# Patient Record
Sex: Male | Born: 1965 | Race: Black or African American | Hispanic: No | Marital: Married | Smoking: Current some day smoker
Health system: Southern US, Community
[De-identification: ages and names within clinical notes are randomized; demographics above are authoritative.]

## PROBLEM LIST (undated history)

## (undated) DIAGNOSIS — I2510 Atherosclerotic heart disease of native coronary artery without angina pectoris: Secondary | ICD-10-CM

## (undated) DIAGNOSIS — F411 Generalized anxiety disorder: Secondary | ICD-10-CM

## (undated) DIAGNOSIS — K219 Gastro-esophageal reflux disease without esophagitis: Secondary | ICD-10-CM

## (undated) DIAGNOSIS — Z720 Tobacco use: Secondary | ICD-10-CM

## (undated) DIAGNOSIS — I255 Ischemic cardiomyopathy: Secondary | ICD-10-CM

## (undated) DIAGNOSIS — E785 Hyperlipidemia, unspecified: Secondary | ICD-10-CM

## (undated) DIAGNOSIS — I110 Hypertensive heart disease with heart failure: Secondary | ICD-10-CM

## (undated) DIAGNOSIS — I10 Essential (primary) hypertension: Secondary | ICD-10-CM

## (undated) DIAGNOSIS — A64 Unspecified sexually transmitted disease: Secondary | ICD-10-CM

## (undated) DIAGNOSIS — Z9119 Patient's noncompliance with other medical treatment and regimen: Secondary | ICD-10-CM

## (undated) DIAGNOSIS — M5430 Sciatica, unspecified side: Secondary | ICD-10-CM

## (undated) DIAGNOSIS — K611 Rectal abscess: Secondary | ICD-10-CM

## (undated) DIAGNOSIS — K921 Melena: Secondary | ICD-10-CM

## (undated) DIAGNOSIS — F1210 Cannabis abuse, uncomplicated: Secondary | ICD-10-CM

## (undated) DIAGNOSIS — I219 Acute myocardial infarction, unspecified: Secondary | ICD-10-CM

## (undated) DIAGNOSIS — Z973 Presence of spectacles and contact lenses: Secondary | ICD-10-CM

## (undated) DIAGNOSIS — Z91199 Patient's noncompliance with other medical treatment and regimen due to unspecified reason: Secondary | ICD-10-CM

## (undated) DIAGNOSIS — I5042 Chronic combined systolic (congestive) and diastolic (congestive) heart failure: Secondary | ICD-10-CM

## (undated) HISTORY — DX: Presence of spectacles and contact lenses: Z97.3

## (undated) HISTORY — DX: Sciatica, unspecified side: M54.30

## (undated) HISTORY — DX: Acute myocardial infarction, unspecified: I21.9

## (undated) HISTORY — DX: Generalized anxiety disorder: F41.1

## (undated) HISTORY — DX: Rectal abscess: K61.1

## (undated) HISTORY — PX: FOOT SURGERY: SHX648

## (undated) HISTORY — PX: INSERT / REPLACE / REMOVE PACEMAKER: SUR710

## (undated) HISTORY — DX: Gastro-esophageal reflux disease without esophagitis: K21.9

---

## 2009-04-12 HISTORY — PX: BYPASS GRAFT: SHX909

## 2010-03-13 DIAGNOSIS — E785 Hyperlipidemia, unspecified: Secondary | ICD-10-CM | POA: Insufficient documentation

## 2010-03-20 DIAGNOSIS — I2510 Atherosclerotic heart disease of native coronary artery without angina pectoris: Secondary | ICD-10-CM | POA: Insufficient documentation

## 2011-03-13 DIAGNOSIS — I219 Acute myocardial infarction, unspecified: Secondary | ICD-10-CM

## 2011-03-13 HISTORY — DX: Acute myocardial infarction, unspecified: I21.9

## 2012-01-09 ENCOUNTER — Other Ambulatory Visit (HOSPITAL_COMMUNITY): Payer: Self-pay

## 2012-01-09 ENCOUNTER — Other Ambulatory Visit: Payer: Self-pay

## 2012-01-09 ENCOUNTER — Emergency Department (HOSPITAL_COMMUNITY): Payer: Self-pay

## 2012-01-09 ENCOUNTER — Emergency Department (HOSPITAL_COMMUNITY)
Admission: EM | Admit: 2012-01-09 | Discharge: 2012-01-09 | Disposition: A | Discharge status: Home / Self Care | Payer: Self-pay | Attending: Emergency Medicine | Admitting: Emergency Medicine

## 2012-01-09 ENCOUNTER — Encounter (HOSPITAL_COMMUNITY): Payer: Self-pay | Admitting: Emergency Medicine

## 2012-01-09 DIAGNOSIS — Z91148 Patient's other noncompliance with medication regimen for other reason: Secondary | ICD-10-CM

## 2012-01-09 DIAGNOSIS — I2510 Atherosclerotic heart disease of native coronary artery without angina pectoris: Secondary | ICD-10-CM | POA: Insufficient documentation

## 2012-01-09 DIAGNOSIS — Z91199 Patient's noncompliance with other medical treatment and regimen due to unspecified reason: Secondary | ICD-10-CM | POA: Insufficient documentation

## 2012-01-09 DIAGNOSIS — Z951 Presence of aortocoronary bypass graft: Secondary | ICD-10-CM | POA: Insufficient documentation

## 2012-01-09 DIAGNOSIS — R918 Other nonspecific abnormal finding of lung field: Secondary | ICD-10-CM

## 2012-01-09 DIAGNOSIS — Z9119 Patient's noncompliance with other medical treatment and regimen: Secondary | ICD-10-CM | POA: Insufficient documentation

## 2012-01-09 DIAGNOSIS — J90 Pleural effusion, not elsewhere classified: Secondary | ICD-10-CM

## 2012-01-09 DIAGNOSIS — R5381 Other malaise: Secondary | ICD-10-CM | POA: Insufficient documentation

## 2012-01-09 DIAGNOSIS — R531 Weakness: Secondary | ICD-10-CM

## 2012-01-09 DIAGNOSIS — Z9114 Patient's other noncompliance with medication regimen: Secondary | ICD-10-CM

## 2012-01-09 HISTORY — DX: Atherosclerotic heart disease of native coronary artery without angina pectoris: I25.10

## 2012-01-09 LAB — CBC WITH DIFFERENTIAL/PLATELET
Basophils Absolute: 0.1 10*3/uL (ref 0.0–0.1)
Basophils Relative: 1 % (ref 0–1)
Eosinophils Absolute: 0.2 10*3/uL (ref 0.0–0.7)
Eosinophils Relative: 4 % (ref 0–5)
MCH: 30.3 pg (ref 26.0–34.0)
MCHC: 34 g/dL (ref 30.0–36.0)
MCV: 89.3 fL (ref 78.0–100.0)
Monocytes Absolute: 0.5 10*3/uL (ref 0.1–1.0)
Platelets: 332 10*3/uL (ref 150–400)
RDW: 14 % (ref 11.5–15.5)
WBC: 6.7 10*3/uL (ref 4.0–10.5)

## 2012-01-09 LAB — COMPREHENSIVE METABOLIC PANEL
ALT: 33 U/L (ref 0–53)
AST: 65 U/L — ABNORMAL HIGH (ref 0–37)
Calcium: 9.2 mg/dL (ref 8.4–10.5)
Creatinine, Ser: 1.06 mg/dL (ref 0.50–1.35)
Sodium: 140 mEq/L (ref 135–145)
Total Protein: 6.7 g/dL (ref 6.0–8.3)

## 2012-01-09 LAB — PRO B NATRIURETIC PEPTIDE: Pro B Natriuretic peptide (BNP): 4024 pg/mL — ABNORMAL HIGH (ref 0–125)

## 2012-01-09 LAB — POCT I-STAT TROPONIN I

## 2012-01-09 MED ORDER — SODIUM CHLORIDE 0.9 % IV BOLUS (SEPSIS)
1000.0000 mL | Freq: Once | INTRAVENOUS | Status: AC
  Administered 2012-01-09: 1000 mL via INTRAVENOUS

## 2012-01-09 MED ORDER — IOHEXOL 300 MG/ML  SOLN
80.0000 mL | Freq: Once | INTRAMUSCULAR | Status: AC | PRN
  Administered 2012-01-09: 80 mL via INTRAVENOUS

## 2012-01-09 MED ORDER — FUROSEMIDE 10 MG/ML IJ SOLN
20.0000 mg | Freq: Once | INTRAMUSCULAR | Status: AC
  Administered 2012-01-09: 20 mg via INTRAVENOUS
  Filled 2012-01-09: qty 2

## 2012-01-09 NOTE — ED Notes (Signed)
Pt reports that when he goes to sleep he wakes up and cannot breathe; he stats "his breathing sessions are not long". Pt states he feels energy is drained; Pt smokes 2-3 cigs/ day. Pt states he has a cough but does not feel SOB or have chest pain. Says he cannot walk up a flight of stairs because he has no energy but not because he is SOB.

## 2012-01-09 NOTE — ED Notes (Signed)
Patient transported to CT 

## 2012-01-09 NOTE — ED Notes (Signed)
Pt takes aspirin, klor-con, lisinopril, simvastatin, carvedilol, furosemide

## 2012-01-09 NOTE — ED Notes (Signed)
Report received, assumed care.  

## 2012-01-09 NOTE — ED Notes (Signed)
Pt states that he has been feeling weak since wed. Pt states that he has been having generalized weakness, that he was unable to walk up the stairs and that he hasn't really gotten much energy back since Wed. Pt states that he has been eating and sleeping well but admits that he has been under a lot of stress lately. Pt alert and oriented, able to follow commands and move extremities.

## 2012-01-09 NOTE — ED Notes (Signed)
ED EKG done in triage; given to EDP/PA.

## 2012-01-09 NOTE — ED Provider Notes (Signed)
History     CSN: [id number]  Arrival date & time 01/09/12  0441   First MD Initiated Contact with Patient 01/09/12 [phone]      Chief Complaint  Patient presents with  . Weakness    (Consider location/radiation/quality/duration/timing/severity/associated sxs/prior treatment) HPI  46 year old male with history of coronary artery disease and prior history of coronary bypass graft presents complaining of weakness. Patient reports he just recently moved to [facility] about 2 weeks ago. He has been trying to work out, and walk at least 2 miles daily in preparation for his new job. He also lost 4 lbs in the past month from diet and exercise. Patient states he stopped taking his medication about 4 days ago because he felt he doesn't need it anymore. Medication include blood pressure, hyperlipidemia, and fluid pills. For the past 3 days he has been feeling weaker than usual. He described as a generalized weakness. He also states he's having trouble sleeping at night, waking up in a panic state. He is unable to attribute his symptoms to anything specific. He denies fever, chills, productive cough, chest pain, shortness of breath, dyspnea on exertion, hemoptysis, nausea, vomiting, diarrhea, abdominal pain, back pain, numbness or tingling sensation. He does not have a primary care Dr. Here in [place].  He still has medications available at home.  Past Medical History  Diagnosis Date  . Coronary artery disease     Past Surgical History  Procedure Date  . Bypass graft 2011    No family history on file.  History  Substance Use Topics  . Smoking status: Not on file  . Smokeless tobacco: Not on file  . Alcohol Use:       Review of Systems  All other systems reviewed and are negative.    Allergies  Review of patient's allergies indicates no known allergies.  Home Medications  No current outpatient prescriptions on file.  BP 171/124  Temp 98.6 F (37 C) (Oral)  Resp 18  SpO2  98%  Physical Exam  Nursing note and vitals reviewed. Constitutional: He appears well-developed and well-nourished. No distress.       Awake, alert, nontoxic appearance  HENT:  Head: Atraumatic.  Mouth/Throat: Oropharynx is clear and moist.  Eyes: Conjunctivae normal are normal. Right eye exhibits no discharge. Left eye exhibits no discharge.  Neck: Normal range of motion. Neck supple.  Cardiovascular: Normal rate and regular rhythm.  Exam reveals no gallop and no friction rub.   Murmur heard.      2 of 6 systolic murmur heard best heard at left  2nd-3rd intercostal space, nonradiating.  Pulmonary/Chest: Effort normal. No respiratory distress. He has rales. He exhibits no tenderness.       Rales heard at the base of lungs. No wheezes or rhonchi  Abdominal: Soft. There is no tenderness. There is no rebound.  Musculoskeletal: He exhibits no edema and no tenderness.       ROM appears intact, no obvious focal weakness  Neurological: He is alert. He has normal strength and normal reflexes. He displays no atrophy and no tremor. He exhibits normal muscle tone. He displays a negative Romberg sign. He displays no seizure activity. Coordination and gait normal. GCS eye subscore is 4. GCS verbal subscore is 5. GCS motor subscore is 6.  Reflex Scores:      Patellar reflexes are 2+ on the right side and 2+ on the left side.      5 out 5 strength to both upper and lower  extremities.  Skin: Skin is warm and dry. No rash noted.  Psychiatric: He has a normal mood and affect.    ED Course  Procedures (including critical care time)  Labs Reviewed  COMPREHENSIVE METABOLIC PANEL - Abnormal; Notable for the following:    Albumin 3.4 (*)     AST 65 (*)     Alkaline Phosphatase 161 (*)     GFR calc non Af Amer 84 (*)     All other components within normal limits  CBC WITH DIFFERENTIAL  POCT I-STAT TROPONIN I   No results found.   No diagnosis found.   Date: 01/09/2012  Rate: 92  Rhythm: normal  sinus rhythm  QRS Axis: right  Intervals: QT prolonged  ST/T Wave abnormalities: nonspecific ST/T changes  Conduction Disutrbances:none  Narrative Interpretation: pulmonary disease pattern.  No S1Q3T3.  No stemi  Old EKG Reviewed: none available  Results for orders placed during the hospital encounter of 01/09/12  CBC WITH DIFFERENTIAL      Component Value Range   WBC 6.7  4.0 - 10.5 K/uL   RBC 4.78  4.22 - 5.81 MIL/uL   Hemoglobin 14.5  13.0 - 17.0 g/dL   HCT 40.9  81.1 - 91.4 %   MCV 89.3  78.0 - 100.0 fL   MCH 30.3  26.0 - 34.0 pg   MCHC 34.0  30.0 - 36.0 g/dL   RDW 78.2  95.6 - 21.3 %   Platelets 332  150 - 400 K/uL   Neutrophils Relative 62  43 - 77 %   Neutro Abs 4.2  1.7 - 7.7 K/uL   Lymphocytes Relative 26  12 - 46 %   Lymphs Abs 1.8  0.7 - 4.0 K/uL   Monocytes Relative 8  3 - 12 %   Monocytes Absolute 0.5  0.1 - 1.0 K/uL   Eosinophils Relative 4  0 - 5 %   Eosinophils Absolute 0.2  0.0 - 0.7 K/uL   Basophils Relative 1  0 - 1 %   Basophils Absolute 0.1  0.0 - 0.1 K/uL  COMPREHENSIVE METABOLIC PANEL      Component Value Range   Sodium 140  135 - 145 mEq/L   Potassium 3.5  3.5 - 5.1 mEq/L   Chloride 107  96 - 112 mEq/L   CO2 23  19 - 32 mEq/L   Glucose, Bld 94  70 - 99 mg/dL   BUN 13  6 - 23 mg/dL   Creatinine, Ser 0.86  0.50 - 1.35 mg/dL   Calcium 9.2  8.4 - 57.8 mg/dL   Total Protein 6.7  6.0 - 8.3 g/dL   Albumin 3.4 (*) 3.5 - 5.2 g/dL   AST 65 (*) 0 - 37 U/L   ALT 33  0 - 53 U/L   Alkaline Phosphatase 161 (*) 39 - 117 U/L   Total Bilirubin 0.6  0.3 - 1.2 mg/dL   GFR calc non Af Amer 84 (*) >90 mL/min   GFR calc Af Amer >90  >90 mL/min  POCT I-STAT TROPONIN I      Component Value Range   Troponin i, poc 0.03  0.00 - 0.08 ng/mL   Comment 3           PRO B NATRIURETIC PEPTIDE      Component Value Range   Pro B Natriuretic peptide (BNP) 4024.0 (*) 0 - 125 pg/mL   Dg Chest 2 View  01/09/2012  *RADIOLOGY REPORT*  Clinical Data: Weakness  and fatigue.  CHEST  - 2 VIEW  Comparison: None.  Findings: There is a round nodular density in the right hilar region, measuring 3.5 x 3.8 cm.  Increased densities in the right infrahilar region.  Findings concerning for lymphadenopathy or mass lesion in this region.  The patient has cardiomegaly and previous median sternotomy.  There is increased densities overlying the left cardiac shadow of unknown etiology.  There may be small pleural effusions.  IMPRESSION: Concern for a mass or lymphadenopathy in the right hilum.  There are associated parenchymal densities in the right infrahilar region.  In addition, there is a vague density overlying the left side of the cardiac shadow.  Findings raise concern for a neoplastic process.  Recommend further evaluation with a chest CT with IV contrast.  These results were called by telephone on 01/09/2012 at 7:05 a.m. to Dr. Rhunette Croft, who verbally acknowledged these results.   Original Report Authenticated By: Richarda Overlie, M.D.    Ct Chest W Contrast  01/09/2012  *RADIOLOGY REPORT*  Clinical Data: Abnormal chest radiograph, cough  CT CHEST WITH CONTRAST  Technique:  Multidetector CT imaging of the chest was performed following the standard protocol during bolus administration of intravenous contrast.  Contrast: 80mL OMNIPAQUE IOHEXOL 300 MG/ML  SOLN  Comparison: Chest radiograph stated 01/09/2012  Findings: The suspected right perihilar mass corresponds to overlapping pulmonary vessels on CT.  No corresponding mass or adenopathy is seen.  3 mm noncalcified subpleural nodule in the left upper lobe (series 3/image 23).  2 mm calcified granulomas in the left upper lobe and left lower lobe (series 3/images 26 and 27).  3 mm calcified granulomas in the right upper and lower lobes (series 3/image 19 and 36).  Moderate right pleural effusion.  Trace fluid along the left major fissure.  Mild linear scarring at the left lung base.  No pneumothorax.  Visualized thyroid is unremarkable.  Cardiomegaly.  No  pericardial effusion.  Coronary atherosclerosis (series 2/image 29). Postsurgical changes related to prior CABG. Mild atherosclerotic calcifications of the aortic arch.  No suspicious mediastinal, hilar, or axillary lymphadenopathy.  Visualized upper abdomen is notable for a probable left renal cyst (series 2/image 65).  Mild degenerative changes of the visualized thoracolumbar spine.  IMPRESSION: Suspected radiographic abnormality corresponds to overlapping pulmonary vessels on CT.  No mass or adenopathy is seen.  Cardiomegaly with moderate right pleural effusion.  3 mm noncalcified subpleural nodule in the left upper lobe.  If this patient is high risk for primary bronchogenic malignancy, a single follow-up CT chest is suggested in 12 months.  Otherwise, no dedicated follow-up imaging is required per Fleischner Society guidelines.  This recommendation follows the consensus statement: Guidelines for Management of Small Pulmonary Nodules Detected on CT Scans:  A Statement from the Fleischner Society as published in Radiology 2005; 237:395-400.   Original Report Authenticated By: Charline Bills, M.D.     1. Generalized weakness 2. Medication noncompliant 3. Abnormal finding on CT scan 4. R side pleural effusion   MDM  Vague generalized weakness complaint.  No focal neuro deficits.  No weakness appreciated to upper and lower extremities on exam.  Since pt has hx of cardiac disease, will obtain ECG, labs, BNP, and CXR. Pt's  story raises low suspicion for PE.  No prior hx of CHF.  Pt walks 2 miles daily, denies DOE.  Will not perform Chest CTA today.  Discussed with my attending  7:36 AM CXR shows a mass in R hilum suspicious for  neoplasm.  Pt has no hx of cancer. Denies fever, nightsweat, myalgias, unexplained weight loss, or FH cancer.  Is a smoker and smoke 6-7 cigarettes daily.  No hemoptysis. Will obtain Chest CT w/CM for further evaluation.  IVF given.  WIll continue to monitor.     9:13  AM Chest CT shows moderate R pleural effusion without obvious mass or adenopathy.  Pt is maintaining good O2 and does not complain of SOB.  I discussed with my attending, who recommend against pleural aspiration at this time.  Will give IV lasix.  Pt without complaints worrisome for infection.    There is a 3mm noncalcified subpleural nodule in the L upper lobe.  A single followup CT chest is suggested in 12 month. I discussed with pt in regard to CT f/u in 12 months.  I also give pt resources and close follow up instruction.    Pt admits that he has not been taking his medication as recommended for the past several months because he does not like the side effect of impotency and also he has been working out and dieting and think he does not need his meds. I recommend resuming his meds.  Pt agrees to f/u with PCP for recheck. Pt does take BB which can increase impotency.    Pt able to ambulate with normal O2, no respiratory complaints.    time spent personally by me on the following activities: Review prior charts, imaging, and labs.  Development of treatment plan with patient and/or surrogate as well as nursing, discussions with consultants, evaluation of patient's response to treatment, examination of patient, obtaining history from patient or surrogate, ordering and performing treatments and interventions, ordering and review of laboratory studies, ordering and review of radiographic studies, pulse oximetry and re-evaluation of patient's condition.  BP 174/119  Pulse 91  Temp 97.2 F (36.2 C) (Oral)  Resp 17  SpO2 98%      Fayrene Helper, PA-C 01/09/12 0946  Fayrene Helper, PA-C 01/09/12 [phone]

## 2012-01-12 NOTE — ED Provider Notes (Signed)
Medical screening examination/treatment/procedure(s) were performed by non-physician practitioner and as supervising physician I was immediately available for consultation/collaboration.  Derwood Kaplan, MD 01/12/12 [phone]

## 2013-02-14 ENCOUNTER — Encounter (HOSPITAL_COMMUNITY): Payer: Self-pay | Admitting: Emergency Medicine

## 2013-02-14 ENCOUNTER — Emergency Department (HOSPITAL_COMMUNITY)
Admission: EM | Admit: 2013-02-14 | Discharge: 2013-02-14 | Disposition: A | Discharge status: Home / Self Care | Payer: BC Managed Care – PPO | Attending: Emergency Medicine | Admitting: Emergency Medicine

## 2013-02-14 DIAGNOSIS — I2510 Atherosclerotic heart disease of native coronary artery without angina pectoris: Secondary | ICD-10-CM | POA: Insufficient documentation

## 2013-02-14 DIAGNOSIS — Z79899 Other long term (current) drug therapy: Secondary | ICD-10-CM | POA: Insufficient documentation

## 2013-02-14 DIAGNOSIS — R61 Generalized hyperhidrosis: Secondary | ICD-10-CM | POA: Insufficient documentation

## 2013-02-14 DIAGNOSIS — Z951 Presence of aortocoronary bypass graft: Secondary | ICD-10-CM | POA: Insufficient documentation

## 2013-02-14 DIAGNOSIS — Z7982 Long term (current) use of aspirin: Secondary | ICD-10-CM | POA: Insufficient documentation

## 2013-02-14 DIAGNOSIS — K089 Disorder of teeth and supporting structures, unspecified: Secondary | ICD-10-CM | POA: Insufficient documentation

## 2013-02-14 DIAGNOSIS — I10 Essential (primary) hypertension: Secondary | ICD-10-CM | POA: Insufficient documentation

## 2013-02-14 HISTORY — DX: Essential (primary) hypertension: I10

## 2013-02-14 LAB — CBC WITH DIFFERENTIAL/PLATELET
Basophils Absolute: 0 10*3/uL (ref 0.0–0.1)
Basophils Relative: 0 % (ref 0–1)
HCT: 49.8 % (ref 39.0–52.0)
Hemoglobin: 17.4 g/dL — ABNORMAL HIGH (ref 13.0–17.0)
Lymphocytes Relative: 17 % (ref 12–46)
MCHC: 34.9 g/dL (ref 30.0–36.0)
Monocytes Absolute: 1 10*3/uL (ref 0.1–1.0)
Monocytes Relative: 9 % (ref 3–12)
Neutro Abs: 7.3 10*3/uL (ref 1.7–7.7)
Neutrophils Relative %: 72 % (ref 43–77)
RDW: 13.7 % (ref 11.5–15.5)
WBC: 10.2 10*3/uL (ref 4.0–10.5)

## 2013-02-14 LAB — BASIC METABOLIC PANEL
CO2: 26 mEq/L (ref 19–32)
Chloride: 104 mEq/L (ref 96–112)
Creatinine, Ser: 1.09 mg/dL (ref 0.50–1.35)
GFR calc Af Amer: >90 mL/min (ref >90)
Potassium: 3.9 mEq/L (ref 3.5–5.1)

## 2013-02-14 LAB — TROPONIN I: Troponin I: <0.3 ng/mL (ref <0.30)

## 2013-02-14 MED ORDER — ONDANSETRON HCL 4 MG/2ML IJ SOLN
4.0000 mg | Freq: Once | INTRAMUSCULAR | Status: AC
  Administered 2013-02-14: 4 mg via INTRAVENOUS
  Filled 2013-02-14: qty 2

## 2013-02-14 MED ORDER — ONDANSETRON 4 MG PO TBDP: 4.0000 mg | ORAL_TABLET | Freq: Three times a day (TID) | ORAL | Status: DC | PRN

## 2013-02-14 MED ORDER — CARVEDILOL 6.25 MG PO TABS
6.2500 mg | ORAL_TABLET | Freq: Two times a day (BID) | ORAL | Status: DC
  Administered 2013-02-14 (×2): 6.25 mg via ORAL
  Filled 2013-02-14 (×2): qty 1

## 2013-02-14 MED ORDER — LABETALOL HCL 5 MG/ML IV SOLN
20.0000 mg | INTRAVENOUS | Status: DC | PRN
  Administered 2013-02-14 (×2): 20 mg via INTRAVENOUS
  Filled 2013-02-14 (×2): qty 4

## 2013-02-14 MED ORDER — ENALAPRILAT 1.25 MG/ML IV SOLN
1.2500 mg | Freq: Once | INTRAVENOUS | Status: AC
  Administered 2013-02-14: 1.25 mg via INTRAVENOUS
  Filled 2013-02-14: qty 1

## 2013-02-14 MED ORDER — FENTANYL CITRATE 0.05 MG/ML IJ SOLN
50.0000 ug | INTRAMUSCULAR | Status: DC | PRN
  Administered 2013-02-14 (×2): 50 ug via INTRAVENOUS
  Filled 2013-02-14 (×2): qty 2

## 2013-02-14 NOTE — ED Notes (Signed)
Pt given gingerale and crackers 

## 2013-02-14 NOTE — ED Notes (Signed)
Per EMS - pt just had tooth extracted around 1pm today, then went to [employer] then ate some chili at [employer]. After he started to have really bad tooth pain and a HA. Pt has hx of MI and HTN. Pt last took BP meds on Sunday (3 days ago). Upon ems arrival pt was SOB and diaphoretic. BP 275/140. ST on monitor. 12 lead unremarkable. No neruo deficits noted. NKA.

## 2013-02-14 NOTE — ED Provider Notes (Signed)
CSN: [id number]     Arrival date & time 02/14/13  1530 History   First MD Initiated Contact with Patient 02/14/13 1532     Chief Complaint  Patient presents with  . Hypertension  . Dental Pain    HPI  47 year old male history of coronary bypass grafting. Had a dental extraction today due to a chronically painful tooth. Started having severe pain within an hour of extraction. Called paramedics because of some severe pain. Noted to be markedly hypertensive.  No chest pain. He has been around his extraction site is that of a global headache. He is diaphoretic on arrival complaining of 10 over 10 pain.  Prehospital course: Received a call from paramedics describing the above patient requesting blood pressure medications. Deferred this and asked to give morphine. He was given total of 10mg  of IV morphine en route. Some minimal improvement of his pain.   Past Medical History  Diagnosis Date  . Coronary artery disease   . Hypertension    Past Surgical History  Procedure Laterality Date  . Bypass graft  2011   History reviewed. No pertinent family history. History  Substance Use Topics  . Smoking status: Never Smoker   . Smokeless tobacco: Not on file  . Alcohol Use: Yes     Comment: ocassionaly    Review of Systems  Constitutional: Negative for fever, chills, diaphoresis, appetite change and fatigue.  HENT: Positive for dental problem. Negative for mouth sores, sore throat and trouble swallowing.        Extraction pain  Eyes: Negative for visual disturbance.  Respiratory: Negative for cough, chest tightness, shortness of breath and wheezing.   Cardiovascular: Negative for chest pain.  Gastrointestinal: Negative for nausea, vomiting, abdominal pain, diarrhea and abdominal distention.  Endocrine: Negative for polydipsia, polyphagia and polyuria.  Genitourinary: Negative for dysuria, frequency and hematuria.  Musculoskeletal: Negative for gait problem.  Skin: Negative for color  change, pallor and rash.  Neurological: Negative for dizziness, syncope, light-headedness and headaches.  Hematological: Does not bruise/bleed easily.  Psychiatric/Behavioral: Negative for behavioral problems and confusion.    Allergies  Review of patient's allergies indicates no known allergies.  Home Medications   Current Outpatient Rx  Name  Route  Sig  Dispense  Refill  . aspirin 325 MG tablet   Oral   Take 325 mg by mouth daily.         . carvedilol (COREG) 6.25 MG tablet   Oral   Take 6.25 mg by mouth 2 (two) times daily with a meal.         . furosemide (LASIX) 40 MG tablet   Oral   Take 40 mg by mouth daily.         . hydrochlorothiazide (HYDRODIURIL) 25 MG tablet   Oral   Take 25 mg by mouth daily.         Marland Kitchen HYDROcodone-acetaminophen (NORCO/VICODIN) 5-325 MG per tablet   Oral   Take 1 tablet by mouth every 6 (six) hours as needed for moderate pain.         Marland Kitchen lisinopril (PRINIVIL,ZESTRIL) 40 MG tablet   Oral   Take 40 mg by mouth daily.         Marland Kitchen OVER THE COUNTER MEDICATION   Oral   Take 1 application by mouth daily as needed ("Ambesol" for tooth/gum pain).         . potassium chloride SA (K-DUR,KLOR-CON) 20 MEQ tablet   Oral   Take 20 mEq by  mouth daily.         . simvastatin (ZOCOR) 20 MG tablet   Oral   Take 20 mg by mouth every evening.         . ondansetron (ZOFRAN ODT) 4 MG disintegrating tablet   Oral   Take 1 tablet (4 mg total) by mouth every 8 (eight) hours as needed for nausea.   6 tablet   0    BP 196/114  Pulse 90  Temp(Src) 99.3 F (37.4 C) (Oral)  Resp 20  SpO2 90% Physical Exam  Constitutional: He is oriented to person, place, and time. He appears well-developed and well-nourished. No distress.  Diaphoretic.  HENT:  Head: Normocephalic.  Mouth/Throat:    Eyes: Conjunctivae are normal. Pupils are equal, round, and reactive to light. No scleral icterus.  Neck: Normal range of motion. Neck supple. No  thyromegaly present.  Cardiovascular: Normal rate and regular rhythm.  Exam reveals no gallop and no friction rub.   No murmur heard. Pulmonary/Chest: Effort normal and breath sounds normal. No respiratory distress. He has no wheezes. He has no rales.  Abdominal: Soft. Bowel sounds are normal. He exhibits no distension. There is no tenderness. There is no rebound.  Musculoskeletal: Normal range of motion.  Neurological: He is alert and oriented to person, place, and time.  Skin: Skin is warm and dry. No rash noted.  Psychiatric: He has a normal mood and affect. His behavior is normal.    ED Course  Dental Date/Time: 02/14/2013 3:53 PM Performed by: Roney Marion Authorized by: Rolland Porter J Consent: Verbal consent obtained. Consent given by: patient Patient understanding: patient states understanding of the procedure being performed Local anesthesia used: yes Anesthesia: local infiltration Local anesthetic: lidocaine 2% without epinephrine Anesthetic total: 3 ml Patient sedated: no Patient tolerance: Patient tolerated the procedure well with no immediate complications. Comments: Periapical block. Right infra-orbital nerve block.   (including critical care time) Labs Review Labs Reviewed  CBC WITH DIFFERENTIAL - Abnormal; Notable for the following:    Hemoglobin 17.4 (*)    All other components within normal limits  BASIC METABOLIC PANEL - Abnormal; Notable for the following:    Glucose, Bld 143 (*)    GFR calc non Af Amer 80 (*)    All other components within normal limits  TROPONIN I   Imaging Review No results found.  EKG Interpretation     Ventricular Rate:  82 PR Interval:  181 QRS Duration: 113 QT Interval:  422 QTC Calculation: 493 R Axis:   -137 Text Interpretation:  Sinus Rhythm Incomplete right bundle branch block Consider left ventricular hypertrophy Abnormal T, consider ischemia, lateral leads No change vs 01-08-2013            MDM   1.  Hypertension   2. Dental implant pain, initial encounter    Patient had good relief of his pain with the dental block. Was given IV pain medications with fentanyl as well. Was given IV Vasotec. Was given IV labetalol. His pressures have improved. He is pain-free. No bleeding from the extraction site. Plan is home continue as planned followup care hydrocodone and penicillin. Zofran for nausea.   Roney Marion, MD 02/14/13 [phone]

## 2013-02-14 NOTE — ED Notes (Signed)
Dr. James at bedside  

## 2013-02-14 NOTE — ED Notes (Signed)
Pt vomited.   

## 2013-02-14 NOTE — ED Notes (Signed)
Pt sts he is starting to feel a little better.  

## 2013-02-14 NOTE — ED Notes (Signed)
Lab at bedside

## 2013-04-05 ENCOUNTER — Other Ambulatory Visit: Payer: Self-pay

## 2013-04-05 ENCOUNTER — Emergency Department (HOSPITAL_COMMUNITY): Payer: BC Managed Care – PPO

## 2013-04-05 ENCOUNTER — Inpatient Hospital Stay (HOSPITAL_COMMUNITY)
Admission: EM | Admit: 2013-04-05 | Discharge: 2013-04-07 | DRG: 292 | Discharge status: Against Medical Advice | Payer: BC Managed Care – PPO | Attending: Internal Medicine | Admitting: Internal Medicine

## 2013-04-05 ENCOUNTER — Encounter (HOSPITAL_COMMUNITY): Payer: Self-pay | Admitting: Emergency Medicine

## 2013-04-05 DIAGNOSIS — I4729 Other ventricular tachycardia: Secondary | ICD-10-CM | POA: Diagnosis present

## 2013-04-05 DIAGNOSIS — D72829 Elevated white blood cell count, unspecified: Secondary | ICD-10-CM | POA: Diagnosis present

## 2013-04-05 DIAGNOSIS — R0602 Shortness of breath: Secondary | ICD-10-CM

## 2013-04-05 DIAGNOSIS — E785 Hyperlipidemia, unspecified: Secondary | ICD-10-CM | POA: Diagnosis present

## 2013-04-05 DIAGNOSIS — R9431 Abnormal electrocardiogram [ECG] [EKG]: Secondary | ICD-10-CM

## 2013-04-05 DIAGNOSIS — Z951 Presence of aortocoronary bypass graft: Secondary | ICD-10-CM

## 2013-04-05 DIAGNOSIS — I4581 Long QT syndrome: Secondary | ICD-10-CM | POA: Diagnosis present

## 2013-04-05 DIAGNOSIS — Z23 Encounter for immunization: Secondary | ICD-10-CM

## 2013-04-05 DIAGNOSIS — J90 Pleural effusion, not elsewhere classified: Secondary | ICD-10-CM | POA: Diagnosis present

## 2013-04-05 DIAGNOSIS — Z91199 Patient's noncompliance with other medical treatment and regimen due to unspecified reason: Secondary | ICD-10-CM

## 2013-04-05 DIAGNOSIS — I472 Ventricular tachycardia, unspecified: Secondary | ICD-10-CM | POA: Diagnosis present

## 2013-04-05 DIAGNOSIS — Z9114 Patient's other noncompliance with medication regimen: Secondary | ICD-10-CM

## 2013-04-05 DIAGNOSIS — Z9119 Patient's noncompliance with other medical treatment and regimen: Secondary | ICD-10-CM

## 2013-04-05 DIAGNOSIS — Z91148 Patient's other noncompliance with medication regimen for other reason: Secondary | ICD-10-CM

## 2013-04-05 DIAGNOSIS — I517 Cardiomegaly: Secondary | ICD-10-CM | POA: Diagnosis present

## 2013-04-05 DIAGNOSIS — Z79899 Other long term (current) drug therapy: Secondary | ICD-10-CM

## 2013-04-05 DIAGNOSIS — I252 Old myocardial infarction: Secondary | ICD-10-CM

## 2013-04-05 DIAGNOSIS — I2510 Atherosclerotic heart disease of native coronary artery without angina pectoris: Secondary | ICD-10-CM | POA: Diagnosis present

## 2013-04-05 DIAGNOSIS — Z7982 Long term (current) use of aspirin: Secondary | ICD-10-CM

## 2013-04-05 DIAGNOSIS — F411 Generalized anxiety disorder: Secondary | ICD-10-CM | POA: Diagnosis present

## 2013-04-05 DIAGNOSIS — Z87891 Personal history of nicotine dependence: Secondary | ICD-10-CM

## 2013-04-05 DIAGNOSIS — R0789 Other chest pain: Secondary | ICD-10-CM | POA: Diagnosis present

## 2013-04-05 DIAGNOSIS — I110 Hypertensive heart disease with heart failure: Secondary | ICD-10-CM

## 2013-04-05 DIAGNOSIS — I509 Heart failure, unspecified: Principal | ICD-10-CM | POA: Diagnosis present

## 2013-04-05 DIAGNOSIS — I10 Essential (primary) hypertension: Secondary | ICD-10-CM | POA: Diagnosis present

## 2013-04-05 LAB — URINALYSIS W MICROSCOPIC + REFLEX CULTURE
Glucose, UA: NEGATIVE mg/dL
Nitrite: NEGATIVE
Specific Gravity, Urine: 1.017 (ref 1.005–1.030)
pH: 5 (ref 5.0–8.0)

## 2013-04-05 LAB — CBC WITH DIFFERENTIAL/PLATELET
Basophils Absolute: 0 10*3/uL (ref 0.0–0.1)
Basophils Relative: 0 % (ref 0–1)
Eosinophils Absolute: 0.2 10*3/uL (ref 0.0–0.7)
HCT: 43.6 % (ref 39.0–52.0)
MCHC: 34.2 g/dL (ref 30.0–36.0)
MCV: 91 fL (ref 78.0–100.0)
Monocytes Absolute: 0.9 10*3/uL (ref 0.1–1.0)
Neutro Abs: 9.1 10*3/uL — ABNORMAL HIGH (ref 1.7–7.7)
Neutrophils Relative %: 79 % — ABNORMAL HIGH (ref 43–77)
RDW: 14.1 % (ref 11.5–15.5)

## 2013-04-05 LAB — RAPID URINE DRUG SCREEN, HOSP PERFORMED
Cocaine: NOT DETECTED
Tetrahydrocannabinol: POSITIVE — AB

## 2013-04-05 LAB — COMPREHENSIVE METABOLIC PANEL
ALT: 25 U/L (ref 0–53)
AST: 39 U/L — ABNORMAL HIGH (ref 0–37)
Albumin: 3.5 g/dL (ref 3.5–5.2)
Calcium: 8.9 mg/dL (ref 8.4–10.5)
Chloride: 106 mEq/L (ref 96–112)
Creatinine, Ser: 1.25 mg/dL (ref 0.50–1.35)
Potassium: 3.8 mEq/L (ref 3.5–5.1)
Total Bilirubin: 0.5 mg/dL (ref 0.3–1.2)
Total Protein: 7.1 g/dL (ref 6.0–8.3)

## 2013-04-05 LAB — TROPONIN I: Troponin I: <0.3 ng/mL (ref <0.30)

## 2013-04-05 MED ORDER — FUROSEMIDE 10 MG/ML IJ SOLN
40.0000 mg | Freq: Once | INTRAMUSCULAR | Status: AC
  Administered 2013-04-06: 40 mg via INTRAVENOUS
  Filled 2013-04-05: qty 4

## 2013-04-05 MED ORDER — FUROSEMIDE 10 MG/ML IJ SOLN
40.0000 mg | Freq: Once | INTRAMUSCULAR | Status: AC
  Administered 2013-04-05: 40 mg via INTRAVENOUS
  Filled 2013-04-05: qty 4

## 2013-04-05 MED ORDER — CARVEDILOL 25 MG PO TABS
25.0000 mg | ORAL_TABLET | Freq: Once | ORAL | Status: AC
  Administered 2013-04-05: 25 mg via ORAL
  Filled 2013-04-05: qty 1

## 2013-04-05 MED ORDER — CARVEDILOL 25 MG PO TABS
25.0000 mg | ORAL_TABLET | Freq: Two times a day (BID) | ORAL | Status: DC
  Administered 2013-04-06 – 2013-04-07 (×3): 25 mg via ORAL
  Filled 2013-04-05 (×5): qty 1

## 2013-04-05 MED ORDER — LORAZEPAM 1 MG PO TABS
1.0000 mg | ORAL_TABLET | Freq: Once | ORAL | Status: AC
  Administered 2013-04-06: 1 mg via ORAL
  Filled 2013-04-05: qty 1

## 2013-04-05 MED ORDER — ALBUTEROL SULFATE (5 MG/ML) 0.5% IN NEBU
5.0000 mg | INHALATION_SOLUTION | Freq: Once | RESPIRATORY_TRACT | Status: AC
  Administered 2013-04-05: 5 mg via RESPIRATORY_TRACT
  Filled 2013-04-05: qty 1

## 2013-04-05 MED ORDER — ENOXAPARIN SODIUM 40 MG/0.4ML ~~LOC~~ SOLN
40.0000 mg | SUBCUTANEOUS | Status: DC
  Administered 2013-04-06 – 2013-04-07 (×2): 40 mg via SUBCUTANEOUS
  Filled 2013-04-05 (×2): qty 0.4

## 2013-04-05 MED ORDER — SODIUM CHLORIDE 0.9 % IJ SOLN
3.0000 mL | Freq: Two times a day (BID) | INTRAMUSCULAR | Status: DC
  Administered 2013-04-06 – 2013-04-07 (×3): 3 mL via INTRAVENOUS

## 2013-04-05 MED ORDER — LISINOPRIL 40 MG PO TABS
40.0000 mg | ORAL_TABLET | Freq: Every day | ORAL | Status: DC
  Administered 2013-04-06 – 2013-04-07 (×2): 40 mg via ORAL
  Filled 2013-04-05 (×2): qty 1

## 2013-04-05 MED ORDER — NICOTINE 14 MG/24HR TD PT24: 14.0000 mg | MEDICATED_PATCH | Freq: Every day | TRANSDERMAL | Status: DC

## 2013-04-05 MED ORDER — SODIUM CHLORIDE 0.9 % IJ SOLN: 3.0000 mL | INTRAMUSCULAR | Status: DC | PRN

## 2013-04-05 MED ORDER — LISINOPRIL 20 MG PO TABS
40.0000 mg | ORAL_TABLET | Freq: Once | ORAL | Status: AC
  Administered 2013-04-05: 40 mg via ORAL
  Filled 2013-04-05: qty 2

## 2013-04-05 MED ORDER — NICOTINE 14 MG/24HR TD PT24
14.0000 mg | MEDICATED_PATCH | TRANSDERMAL | Status: DC
  Administered 2013-04-06 – 2013-04-07 (×2): 14 mg via TRANSDERMAL
  Filled 2013-04-05 (×3): qty 1

## 2013-04-05 MED ORDER — ALBUTEROL SULFATE (5 MG/ML) 0.5% IN NEBU
INHALATION_SOLUTION | RESPIRATORY_TRACT | Status: AC
  Filled 2013-04-05: qty 0.5

## 2013-04-05 MED ORDER — INFLUENZA VAC SPLIT QUAD 0.5 ML IM SUSP
0.5000 mL | INTRAMUSCULAR | Status: AC
  Administered 2013-04-06: 0.5 mL via INTRAMUSCULAR
  Filled 2013-04-05: qty 0.5

## 2013-04-05 MED ORDER — PNEUMOCOCCAL VAC POLYVALENT 25 MCG/0.5ML IJ INJ
0.5000 mL | INJECTION | INTRAMUSCULAR | Status: AC
  Administered 2013-04-06: 0.5 mL via INTRAMUSCULAR
  Filled 2013-04-05: qty 0.5

## 2013-04-05 MED ORDER — SODIUM CHLORIDE 0.9 % IJ SOLN: 3.0000 mL | Freq: Two times a day (BID) | INTRAMUSCULAR | Status: DC

## 2013-04-05 MED ORDER — SIMVASTATIN 40 MG PO TABS
40.0000 mg | ORAL_TABLET | Freq: Every day | ORAL | Status: DC
  Administered 2013-04-06: 40 mg via ORAL
  Filled 2013-04-05 (×2): qty 1

## 2013-04-05 MED ORDER — ASPIRIN EC 325 MG PO TBEC
325.0000 mg | DELAYED_RELEASE_TABLET | Freq: Every day | ORAL | Status: DC
  Administered 2013-04-06 – 2013-04-07 (×3): 325 mg via ORAL
  Filled 2013-04-05 (×3): qty 1

## 2013-04-05 MED ORDER — PREDNISONE 20 MG PO TABS
60.0000 mg | ORAL_TABLET | Freq: Once | ORAL | Status: AC
  Administered 2013-04-05: 60 mg via ORAL
  Filled 2013-04-05: qty 3

## 2013-04-05 MED ORDER — SODIUM CHLORIDE 0.9 % IV SOLN: 250.0000 mL | INTRAVENOUS | Status: DC | PRN

## 2013-04-05 MED ORDER — HYDROCHLOROTHIAZIDE 25 MG PO TABS
25.0000 mg | ORAL_TABLET | Freq: Every day | ORAL | Status: DC
  Administered 2013-04-06: 25 mg via ORAL
  Filled 2013-04-05 (×2): qty 1

## 2013-04-05 MED ORDER — HYDROCHLOROTHIAZIDE 25 MG PO TABS
25.0000 mg | ORAL_TABLET | Freq: Once | ORAL | Status: AC
  Administered 2013-04-05: 25 mg via ORAL
  Filled 2013-04-05: qty 1

## 2013-04-05 MED ORDER — IPRATROPIUM BROMIDE 0.02 % IN SOLN
0.5000 mg | Freq: Once | RESPIRATORY_TRACT | Status: AC
  Administered 2013-04-05: 0.5 mg via RESPIRATORY_TRACT
  Filled 2013-04-05: qty 2.5

## 2013-04-05 MED ORDER — NITROGLYCERIN 2 % TD OINT
1.0000 [in_us] | TOPICAL_OINTMENT | Freq: Once | TRANSDERMAL | Status: DC
  Administered 2013-04-05: 1 [in_us] via TOPICAL
  Filled 2013-04-05: qty 1

## 2013-04-05 NOTE — ED Notes (Signed)
Internal Medicine at bedside.  

## 2013-04-05 NOTE — H&P (Signed)
Date: 04/05/2013               Patient Name:  Roger Martinez MRN: [id number]  DOB: Dec 16, 1965 Age / Sex: 47 y.o., male   PCP: Pcp Not In System         Medical Service: Internal Medicine Teaching Service         Attending Physician: Dr. Farley Ly, MD    First Contact: Dr. Glendell Docker Pager: [phone]  Second Contact: Dr. Burtis Junes Pager: [phone]       After Hours (After 5p/  First Contact Pager: [phone]  weekends / holidays): Second Contact Pager: [phone]   Chief Complaint: SOB  History of Present Illness:  Roger Martinez is a 47 y.o. male w/ PMHx of CAD (CABG in 2011) and HTN, presents to the ED w/ complaints of worsening SOB. The patient says he has had SOB which started about 11 days ago and has been progressively getting worse. The patient claims he has also had a cough recently, dry in nature, but more recently, productive of a scant, yellowish sputum. He also admits to recent worsening PND and orthopnea, some mild weight gain and slight increase in abdominal girth. The patient has had some difficulty obtaining his medications lately and has attempted to "stretch" out his BP medications recently, causing him to be non-compliant and out of medications for the past 1 month. He denies any chest pain, dizziness, lightheadedness, palpitations, LE swelling, abdominal pain, nausea, vomiting, fever, or chills. 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   Meds: Prescriptions prior to admission  Medication Sig Dispense Refill  . aspirin EC 325 MG tablet Take 325 mg by mouth daily.      . carvedilol (COREG) 25 MG tablet Take 25 mg by mouth 2 (two) times daily with a meal.      . hydrochlorothiazide (HYDRODIURIL) 25 MG tablet Take 25 mg by mouth daily.      Marland Kitchen lisinopril (PRINIVIL,ZESTRIL) 40 MG tablet Take 40 mg by mouth daily.      . pravastatin (PRAVACHOL)  40 MG tablet Take 40 mg by mouth daily.        Current Facility-Administered Medications  Medication Dose Route Frequency Provider Last Rate Last Dose  . albuterol (PROVENTIL) (5 MG/ML) 0.5% nebulizer solution           . [START ON 04/06/2013] furosemide (LASIX) injection 40 mg  40 mg Intravenous Once Annett Gula, MD      . Melene Muller ON 04/06/2013] furosemide (LASIX) injection 40 mg  40 mg Intravenous Once Annett Gula, MD        Allergies: Allergies as of 04/05/2013  . (No Known Allergies)   Past Medical History  Diagnosis Date  . Coronary artery disease   . Hypertension    Past Surgical History  Procedure Laterality Date  . Bypass graft  2011    3 vessel   History reviewed. No pertinent family history. History   Social History  . Marital Status: Single    Spouse Name: N/A    Number of Children: N/A  . Years of Education: N/A   Occupational History  . Not on file.   Social History Main Topics  . Smoking status: Former Smoker -- 0.50 packs/day for 15 years    Types: Cigarettes    Quit date: 04/05/2013  .  Smokeless tobacco: Not on file  . Alcohol Use: Yes     Comment: ocassionaly  . Drug Use: Yes    Special: Marijuana  . Sexual Activity: Not on file   Other Topics Concern  . Not on file   Social History Narrative  . No narrative on file    Review of Systems: General: Positive for fatigue. Denies fever, chills, diaphoresis, appetite change.  Respiratory: Positive for SOB, cough, and chest tightness. Denies wheezing.   Cardiovascular: Denies chest pain, palpitations and leg swelling.  Gastrointestinal: Denies nausea, vomiting, abdominal pain, diarrhea, constipation, blood in stool and abdominal distention.  Genitourinary: Denies dysuria, urgency, frequency, hematuria, flank pain and difficulty urinating.  Endocrine: Denies hot or cold intolerance, sweats, polyuria, polydipsia. Musculoskeletal: Denies myalgias, back pain, joint swelling, arthralgias and gait  problem.  Skin: Denies pallor, rash and wounds.  Neurological: Denies dizziness, seizures, syncope, weakness, lightheadedness, numbness and headaches.  Psychiatric/Behavioral: Positive for anxiety. Denies mood changes, confusion, nervousness, sleep disturbance.  Physical Exam: Filed Vitals:   04/05/13 2135 04/05/13 2200 04/05/13 2230 04/05/13 2325  BP: 192/123 176/99 175/99 177/117  Pulse: 85 87 82 84  Temp:    98.7 F (37.1 C)  TempSrc:    Oral  Resp: 23 16 23    Height:    5\' 11"  (1.803 m)  Weight:    203 lb 3.2 oz (92.171 kg)  SpO2: 94% 97% 91% 93%  General: Vital signs reviewed.  Patient is a well-developed and well-nourished, in no acute distress and cooperative with exam. Alert and oriented x3.  Head: Normocephalic and atraumatic. Eyes: PERRL, EOMI, conjunctivae normal, No scleral icterus.  Neck: Supple, trachea midline, normal ROM, No JVD, masses, thyromegaly, or carotid bruit present.  Cardiovascular: RRR, S1 normal, S2 normal, no murmurs, gallops, or rubs. Midline scar 2/2 CABG in 2011.  Pulmonary/Chest: Normal respiratory effort, CTAB, bibasilar crackles present on exam. No wheezes.  Abdominal: Soft, non-tender, non-distended, bowel sounds are normal, no masses, organomegaly, or guarding present.  Musculoskeletal: No joint deformities, erythema, or stiffness, ROM full and no nontender. Extremities: Trace pitting edema,  pulses symmetric and intact bilaterally. No cyanosis or clubbing. Neurological: A&O x3, Strength is normal and symmetric bilaterally, cranial nerve II-XII are grossly intact, no focal motor deficit, sensory intact to light touch bilaterally.  Skin: Warm, dry and intact. No rashes or erythema. Psychiatric: Normal mood and affect. speech and behavior is normal. Cognition and memory are normal.   Lab results: Basic Metabolic Panel:  Recent Labs  16/10/96 2004  NA 139  K 3.8  CL 106  CO2 21  GLUCOSE 97  BUN 17  CREATININE 1.25  CALCIUM 8.9   Liver  Function Tests:  Recent Labs  04/05/13 2004  AST 39*  ALT 25  ALKPHOS 120*  BILITOT 0.5  PROT 7.1  ALBUMIN 3.5   CBC:  Recent Labs  04/05/13 2004  WBC 11.4*  NEUTROABS 9.1*  HGB 14.9  HCT 43.6  MCV 91.0  PLT 244   Cardiac Enzymes:  Recent Labs  04/05/13 2015  TROPONINI <0.30   BNP:  Recent Labs  04/05/13 2015  PROBNP 4534.0*   Urine Drug Screen: Drugs of Abuse     Component Value Date/Time   LABOPIA NONE DETECTED 04/05/2013 2106   COCAINSCRNUR NONE DETECTED 04/05/2013 2106   LABBENZ NONE DETECTED 04/05/2013 2106   AMPHETMU NONE DETECTED 04/05/2013 2106   THCU POSITIVE* 04/05/2013 2106   LABBARB NONE DETECTED 04/05/2013 2106    Urinalysis:  Recent  Labs  04/05/13 2106  COLORURINE YELLOW  LABSPEC 1.017  PHURINE 5.0  GLUCOSEU NEGATIVE  HGBUR SMALL*  BILIRUBINUR NEGATIVE  KETONESUR NEGATIVE  PROTEINUR 100*  UROBILINOGEN 0.2  NITRITE NEGATIVE  LEUKOCYTESUR NEGATIVE   Imaging results:  Dg Chest 2 View  04/05/2013   CLINICAL DATA:  Shortness of breath, cough and weakness.  EXAM: CHEST - 2 VIEW  COMPARISON:  01/09/2012.  FINDINGS: Stable cardiomegaly noted with evidence of prior CABG. There is evidence of congestive heart failure with mild to moderate edema present as well as a small right pleural effusion. No airspace consolidation is identified.  IMPRESSION: Cardiomegaly and mild to moderate CHF with small right pleural effusion.   Electronically Signed   By: Irish Lack M.D.   On: 04/05/2013 20:36    Other results: EKG: NSR @ 90 bpm, LAE, right-axis deviation, diffuse T-wave inversions, LVH. No significant change from previous EKG  Assessment & Plan by Problem: Roger Martinez is a 47 y.o. male w/ PMHx of CAD (CABG in 2011) and HTN, presents to the ED w/ complaints of worsening SOB, likely 2/2 CHF exacerbation.  Shortness of Breath- Patient w/ previous h/o CAD w/ 3-vessel CABG in 2011 (in [place], [place]), recently non-compliant w/ BP  medications for 1 month. Admits to worsening PND, orthopnea, mild recent weight gain, and increasing abdominal girth. Claims to have had issues with "fluid" in the past. Given clinical presentation, likely CHF exacerbation at this time. Bibasilar crackles on exam, CXR in ED shows cardiomegaly w/ mild/moderate CHF w/ small right pleural effusion. ACS also considered given h/o CAD, however TIMI score of 1-2 (h/o CAD, no family h/o cardiac disease). PE unlikely as well, Well's/Revised Geneva scores reflect low probability. PERC -ve. Pneumonia also not as likely as patient is afebrile, cough only productive of scant yellowish sputum. No sick contacts at home. Patient does have mild leukocytosis of 11.4. Given albuterol/atrovent nebulizer treatment in ED w/ minimal relief of symptoms. Also given 60 mg Prednisone. -EKG shows NSR @ 90 bpm, LAE, right-axis deviation, diffuse T-wave inversions, and LVH. No significant change from previous.  -Troponin x2 -ve. Continue to cycle enzymes -pBNP 4534.0, increased from previous in 01/09/2012, of 4024.0 -Given Lasix 40 mg IV in ED w/ adequate diuresis. Give another 40 mg IV Lasix. Continue 40 mg po bid in AM. -Continue home meds; Coreg 25 mg bid + Lisinopril 40 mg qd + HCTZ 25 mg qd -Give pneumovax + flu vaccine in AM -CBC w/ mild leukocytosis this AM, 10.8, improved. BMET wnl.  CAD- H/o CABG in 2011. No recent issues. Given recent SOB however, some concern for ACS. Patient denies any recent chest pain. -Troponin x1 -ve. Continue to cycle enzymes. -Continue home medications (as above) + ASA 325 mg + Zocor 40 mg -Lipid panel shows LDL of 118, total cholesterol of 171, HDL of 46.  Anxiety- Patient describes a recent history of anxiety, unclear as to whether the SOB or anxiety comes first. Has never taken medications for this or seen a psychiatrist/mental health professional.  -Given Ativan 1 mg once for anxiety. -Advise patient to follow up w/ PCP and discuss long-term  management of anxiety.  HTN- Patient w/ elevated BP on admission; 160-180's/90'100's. Recently non-compliant w/ medications at home for ~1 month as he has ran out and had difficulty obtaining refills.  -Continue home meds for now (as above). -Given Lasix 40 IV x2. Will continue Lasix 40 mg po bid.   Dispo: Disposition is deferred at this  time, awaiting improvement of current medical problems. Anticipated discharge in approximately 1-2 day(s).   The patient does not have a current PCP (Pcp Not In System) and does need an [facility] hospital follow-up appointment after discharge.  The patient does not have transportation limitations that hinder transportation to clinic appointments.  Signed: Courtney Paris, MD 04/05/2013, 11:16 PM

## 2013-04-05 NOTE — ED Notes (Signed)
Per EMS pt c/o SOB since last evening. Pt has persistent cough with green sputum. Pt A&O x 3 and ambulatory on arrival.

## 2013-04-05 NOTE — ED Provider Notes (Signed)
CSN: [id number]     Arrival date & time 04/05/13  1910 History   First MD Initiated Contact with Patient 04/05/13 1914     Chief Complaint  Patient presents with  . Shortness of Breath   (Consider location/radiation/quality/duration/timing/severity/associated sxs/prior Treatment) HPI Jamori Biggar is a 47 y.o. male who presents to emergency department complaining of cough and shortness of breath. Patient states that he developed a cough approximately 9 days ago. States cough has been worsening. It is productive with thick mucus. He states he developed shortness of breath over the last 3 days but worsened last night. Patient states he this morning was unable to get air. He states his shortness of breath is worse with ambulation. He states he has extensive coronary disease with quadruple bypass in 2011. He states that he had been seeing during that time increasing in [place]. He has moved to this area 2 years ago. He has been followed by Dr. Renold Don. but states he had a change in his insurance and he has owned Dr. Jacinto Halim money and is now unable to be seen by him. Patient states that he has been running short on all of his medications including his blood pressure medicines and his cholesterol medicine. He states he does a few days at a time without taking his medicines so that he does not run out of them. He states he also is a smoker and states he quit "on the way to the hospital." He denies any prior history of lung problems. He denies any fever or chills. He denies any chest pain.  Past Medical History  Diagnosis Date  . Coronary artery disease   . Hypertension    Past Surgical History  Procedure Laterality Date  . Bypass graft  2011   History reviewed. No pertinent family history. History  Substance Use Topics  . Smoking status: Former Smoker -- 0.50 packs/day for 15 years    Types: Cigarettes    Quit date: 04/05/2013  . Smokeless tobacco: Not on file  . Alcohol Use: Yes      Comment: ocassionaly    Review of Systems  Constitutional: Negative for fever and chills.  Respiratory: Positive for cough and shortness of breath. Negative for chest tightness.   Cardiovascular: Negative for chest pain, palpitations and leg swelling.  Gastrointestinal: Negative for nausea, vomiting, abdominal pain, diarrhea and abdominal distention.  Genitourinary: Positive for flank pain. Negative for dysuria, urgency, frequency and hematuria.  Musculoskeletal: Negative for arthralgias, myalgias, neck pain and neck stiffness.  Skin: Negative for rash.  Allergic/Immunologic: Negative for immunocompromised state.  Neurological: Negative for dizziness, weakness, light-headedness, numbness and headaches.    Allergies  Review of patient's allergies indicates no known allergies.  Home Medications   Current Outpatient Rx  Name  Route  Sig  Dispense  Refill  . aspirin EC 325 MG tablet   Oral   Take 325 mg by mouth daily.         . carvedilol (COREG) 25 MG tablet   Oral   Take 25 mg by mouth 2 (two) times daily with a meal.         . hydrochlorothiazide (HYDRODIURIL) 25 MG tablet   Oral   Take 25 mg by mouth daily.         Marland Kitchen lisinopril (PRINIVIL,ZESTRIL) 40 MG tablet   Oral   Take 40 mg by mouth daily.         . pravastatin (PRAVACHOL) 40 MG tablet   Oral  Take 40 mg by mouth daily.          BP 185/109  Pulse 88  Temp(Src) 98.7 F (37.1 C) (Oral)  Resp 26  Ht 5\' 11"  (1.803 m)  Wt 208 lb (94.348 kg)  BMI 29.02 kg/m2  SpO2 95% Physical Exam  Nursing note and vitals reviewed. Constitutional: He appears well-developed and well-nourished. No distress.  HENT:  Head: Normocephalic and atraumatic.  Eyes: Conjunctivae are normal.  Neck: Neck supple.  Cardiovascular: Normal rate, regular rhythm and normal heart sounds.   Pulmonary/Chest: Effort normal. No respiratory distress. He has no wheezes. He has no rales.  Abdominal: Soft. Bowel sounds are normal. He  exhibits no distension. There is no tenderness. There is no rebound.  Musculoskeletal: He exhibits no edema.  Neurological: He is alert.  Skin: Skin is warm and dry.    ED Course  Procedures (including critical care time) Labs Review Labs Reviewed  CBC WITH DIFFERENTIAL - Abnormal; Notable for the following:    WBC 11.4 (*)    Neutrophils Relative % 79 (*)    Neutro Abs 9.1 (*)    All other components within normal limits  COMPREHENSIVE METABOLIC PANEL - Abnormal; Notable for the following:    AST 39 (*)    Alkaline Phosphatase 120 (*)    GFR calc non Af Amer 68 (*)    GFR calc Af Amer 79 (*)    All other components within normal limits  URINALYSIS W MICROSCOPIC + REFLEX CULTURE - Abnormal; Notable for the following:    APPearance CLOUDY (*)    Hgb urine dipstick SMALL (*)    Protein, ur 100 (*)    Casts GRANULAR CAST (*)    All other components within normal limits  PRO B NATRIURETIC PEPTIDE - Abnormal; Notable for the following:    Pro B Natriuretic peptide (BNP) 4534.0 (*)    All other components within normal limits  URINE RAPID DRUG SCREEN (HOSP PERFORMED) - Abnormal; Notable for the following:    Tetrahydrocannabinol POSITIVE (*)    All other components within normal limits  TROPONIN I  LIPID PANEL  BASIC METABOLIC PANEL  MAGNESIUM  PHOSPHORUS  CBC WITH DIFFERENTIAL  TROPONIN I  TROPONIN I   Imaging Review Dg Chest 2 View  04/05/2013   CLINICAL DATA:  Shortness of breath, cough and weakness.  EXAM: CHEST - 2 VIEW  COMPARISON:  01/09/2012.  FINDINGS: Stable cardiomegaly noted with evidence of prior CABG. There is evidence of congestive heart failure with mild to moderate edema present as well as a small right pleural effusion. No airspace consolidation is identified.  IMPRESSION: Cardiomegaly and mild to moderate CHF with small right pleural effusion.   Electronically Signed   By: Irish Lack M.D.   On: 04/05/2013 20:36    EKG Interpretation   None        MDM   1. CHF (congestive heart failure)   2. Shortness of breath   3. Cardiomegaly   4. Chest tightness   5. CHF, acute on chronic   6. H/O medication noncompliance   7. HTN (hypertension)   8. Long QT interval   9. Pleural effusion, right   10. SOB (shortness of breath)     Patient with cough, congestion, upper respiratory type symptoms, shortness of breath for several days. His symptoms worsened last night. He states today he is unable to get enough air. He has history of hypertension. History of prior MI with quadruple bypass. He  is noncompliant with his current medications. He does not have a close followup. His chest x-ray showed mild to moderate CHF. His BNP is elevated. His EKG and troponin are negative so far. Given his symptoms, chest pain, hypertension in ED, shortness of breath will admit for observation, diuresis, and cycle enzymes. I did order him few neb treatments which did not help. He also received a dose of steroids. I have also ordered him a nitroglycerin paste as well as a does of Lasix IV. I discussed patient with teaching service who will admit pt.   Filed Vitals:   04/05/13 2325 04/05/13 2349 04/05/13 2352 04/05/13 2354  BP: 177/117 180/105 171/109 175/99  Pulse: 84 83 84 87  Temp: 98.7 F (37.1 C)     TempSrc: Oral     Resp:      Height: 5\' 11"  (1.803 m)     Weight: 203 lb 3.2 oz (92.171 kg)     SpO2: 93%          Lorraine Terriquez A Cristen Murcia, PA-C 04/05/13 2357

## 2013-04-05 NOTE — ED Provider Notes (Signed)
Medical screening examination/treatment/procedure(s) were performed by non-physician practitioner and as supervising physician I was immediately available for consultation/collaboration.    EKG (EKG did not cross over into Muse) Normal sinus rhythm rate 90 Right axis deviation T-wave inversion diffusely Probable left ventricular hypertrophy No significant change when compared to prior EKG dated 02/14/2013  Celene Kras, MD 04/05/13 2007

## 2013-04-06 ENCOUNTER — Encounter (HOSPITAL_COMMUNITY): Payer: Self-pay | Admitting: Internal Medicine

## 2013-04-06 DIAGNOSIS — I359 Nonrheumatic aortic valve disorder, unspecified: Secondary | ICD-10-CM

## 2013-04-06 LAB — CBC WITH DIFFERENTIAL/PLATELET
Basophils Relative: 0 % (ref 0–1)
Eosinophils Absolute: 0 10*3/uL (ref 0.0–0.7)
Eosinophils Relative: 0 % (ref 0–5)
HCT: 44.1 % (ref 39.0–52.0)
Hemoglobin: 15.3 g/dL (ref 13.0–17.0)
Lymphocytes Relative: 7 % — ABNORMAL LOW (ref 12–46)
Lymphs Abs: 0.8 10*3/uL (ref 0.7–4.0)
MCH: 31.4 pg (ref 26.0–34.0)
Monocytes Relative: 2 % — ABNORMAL LOW (ref 3–12)
Neutro Abs: 9.9 10*3/uL — ABNORMAL HIGH (ref 1.7–7.7)
Neutrophils Relative %: 91 % — ABNORMAL HIGH (ref 43–77)
RBC: 4.87 MIL/uL (ref 4.22–5.81)

## 2013-04-06 LAB — MAGNESIUM: Magnesium: 1.9 mg/dL (ref 1.5–2.5)

## 2013-04-06 LAB — BASIC METABOLIC PANEL
CO2: 24 mEq/L (ref 19–32)
Glucose, Bld: 156 mg/dL — ABNORMAL HIGH (ref 70–99)
Potassium: 3.7 mEq/L (ref 3.5–5.1)
Sodium: 141 mEq/L (ref 135–145)

## 2013-04-06 LAB — LIPID PANEL
Cholesterol: 171 mg/dL (ref 0–200)
HDL: 46 mg/dL (ref >39)
Total CHOL/HDL Ratio: 3.7 RATIO
Triglycerides: 34 mg/dL (ref <150)
VLDL: 7 mg/dL (ref 0–40)

## 2013-04-06 LAB — TROPONIN I: Troponin I: <0.3 ng/mL (ref <0.30)

## 2013-04-06 MED ORDER — ACETAMINOPHEN 325 MG PO TABS
650.0000 mg | ORAL_TABLET | Freq: Four times a day (QID) | ORAL | Status: DC | PRN
  Administered 2013-04-06: 650 mg via ORAL
  Filled 2013-04-06: qty 2

## 2013-04-06 NOTE — Progress Notes (Signed)
Pt had a 10 beat run of V tach while resting in bed. Md on call made aware. No new orders received. Will cont to monitor pt.

## 2013-04-06 NOTE — Progress Notes (Signed)
PT Cancellation Note  Patient Details Name: Roger Martinez MRN: [id number] DOB: 15-Mar-1966   Cancelled Treatment:    Reason Eval/Treat Not Completed: Other (comment) (pt sleeping).  Pt requested PT come back after he takes a nap.  PT to check back later this PM.    Thanks,   Lurena Joiner B. Deloras Reichard, PT, DPT [phone]   04/06/2013, 12:09 PM

## 2013-04-06 NOTE — Progress Notes (Signed)
PT Cancellation Note  Patient Details Name: Roger Martinez MRN: [id number] DOB: Nov 16, 1965   Cancelled Treatment:    Reason Eval/Treat Not Completed: PT screened, no needs identified, will sign off;OT screened, no needs identified, will sign off.  Pt is ambulating independently in room.  Reports no SOB and no lightheadedness with gait.  He stood and talke with me for greater than 5 mins without issues.  PT screened.  He is young and mobile and has no PT/OT needs at this time.  Pt is ok with [place] signing off.  Thanks,    Rollene Rotunda. Aunesty Tyson, PT, DPT [phone]   04/06/2013, 2:41 PM

## 2013-04-06 NOTE — H&P (Signed)
Internal Medicine Attending Admission Note Date: 04/06/2013  Patient name: Roger Martinez Medical record number: [id number] Date of birth: October 17, 1965 Age: 47 y.o. Gender: male  I saw and evaluated the patient. I reviewed the resident's note and I agree with the resident's findings and plan as documented in the resident's note, with the following additional comments.  Chief Complaint(s): Shortness of breath  History - key components related to admission: Patient is a 47 year old man with history of coronary artery disease, MI, S/P stent to the right coronary artery in 2008,  S/P 3-vessel CABG on 03/20/2010 at [facility] in [place], [place], congestive heart failure with severe concentric LVH and ejection fraction 35-40% by 2-D echocardiogram done at [facility] 02/2011, hypertension, hyperlipidemia, and other problems as outlined in the medical history, admitted with complaint of shortness of breath which has progressed over the past 2 weeks, orthopnea, and PND.  Patient reports difficulty affording his medications and has been stretching them out by taking a dose about every fourth day.  He denies chest pain.   Physical Exam - key components related to admission:  Filed Vitals:   04/05/13 2352 04/05/13 2354 04/06/13 0447 04/06/13 0959  BP: 171/109 175/99 151/84 157/92  Pulse: 84 87 61   Temp:   98.1 F (36.7 C)   TempSrc:   Oral   Resp:      Height:      Weight:      SpO2:   96%     General: Alert, oriented, no acute distress Lungs: Bibasilar crackles Heart: Regular; S1-S2, no S3, no S4, no murmurs Abdomen: Bowel sounds present, soft, nontender Extremities: Trace bilateral edema   Lab results:   Basic Metabolic Panel:  Recent Labs  40/98/11 2004 04/06/13 0202  NA 139 141  K 3.8 3.7  CL 106 104  CO2 21 24  GLUCOSE 97 156*  BUN 17 17  CREATININE 1.25 1.21  CALCIUM 8.9 9.3  MG  --  1.9  PHOS  --  2.3    Liver Function Tests:  Recent Labs  04/05/13 2004   AST 39*  ALT 25  ALKPHOS 120*  BILITOT 0.5  PROT 7.1  ALBUMIN 3.5     CBC:  Recent Labs  04/05/13 2004 04/06/13 0202  WBC 11.4* 10.8*  HGB 14.9 15.3  HCT 43.6 44.1  MCV 91.0 90.6  PLT 244 253    Recent Labs  04/05/13 2004 04/06/13 0202  NEUTROABS 9.1* 9.9*  LYMPHSABS 1.3 0.8  MONOABS 0.9 0.2  EOSABS 0.2 0.0  BASOSABS 0.0 0.0    Cardiac Enzymes:  Recent Labs  04/05/13 2015 04/06/13 0202 04/06/13 0756  TROPONINI <0.30 <0.30 <0.30    BNP:  Recent Labs  04/05/13 2015  PROBNP 4534.0*    Fasting Lipid Panel:  Recent Labs  04/06/13 0202  CHOL 171  HDL 46  LDLCALC 118*  TRIG 34  CHOLHDL 3.7     Urine Drug Screen: Drugs of Abuse     Component Value Date/Time   LABOPIA NONE DETECTED 04/05/2013 2106   COCAINSCRNUR NONE DETECTED 04/05/2013 2106   LABBENZ NONE DETECTED 04/05/2013 2106   AMPHETMU NONE DETECTED 04/05/2013 2106   THCU POSITIVE* 04/05/2013 2106   LABBARB NONE DETECTED 04/05/2013 2106      Urinalysis    Component Value Date/Time   COLORURINE YELLOW 04/05/2013 2106   APPEARANCEUR CLOUDY* 04/05/2013 2106   LABSPEC 1.017 04/05/2013 2106   PHURINE 5.0 04/05/2013 2106   GLUCOSEU NEGATIVE 04/05/2013 2106  HGBUR SMALL* 04/05/2013 2106   BILIRUBINUR NEGATIVE 04/05/2013 2106   KETONESUR NEGATIVE 04/05/2013 2106   PROTEINUR 100* 04/05/2013 2106   UROBILINOGEN 0.2 04/05/2013 2106   NITRITE NEGATIVE 04/05/2013 2106   LEUKOCYTESUR NEGATIVE 04/05/2013 2106    Urine microscopic:  Recent Labs  04/05/13 2106  EPIU RARE  RBCU 0-2  BACTERIA RARE  LABCAST GRANULAR CAST*    Imaging results:  Dg Chest 2 View  04/05/2013   CLINICAL DATA:  Shortness of breath, cough and weakness.  EXAM: CHEST - 2 VIEW  COMPARISON:  01/09/2012.  FINDINGS: Stable cardiomegaly noted with evidence of prior CABG. There is evidence of congestive heart failure with mild to moderate edema present as well as a small right pleural effusion. No airspace  consolidation is identified.  IMPRESSION: Cardiomegaly and mild to moderate CHF with small right pleural effusion.   Electronically Signed   By: Irish Lack M.D.   On: 04/05/2013 20:36    Other results: EKG: Sinus rhythm; left atrial enlargement; right axis deviation; consider left ventricular hypertrophy; ST depressions/T. inversion in 2, 3, aVF, V5, and V6, no significant change from prior EKG  Assessment & Plan by Problem:  1.  Congestive heart failure.  Patient has history of ischemic heart disease and is S/P CABG in 2011 done at [facility] in [place]; he also has been treated there for congestive heart failure.  He presents with an exacerbation of congestive heart failure likely due to not taking his medications.  He has had no recent medical followup.  He denies any chest pain associated with his recent symptoms.  He is feeling somewhat better today following initial diuresis.  The plan is monitor; serial enzymes; ASA; diurese and follow in's and out, electrolytes, and renal function; 2-D echocardiogram.  2.  Hypertension, poorly controlled.  As above, this is likely due to not taking his medications.  Plan is diurese as above; resume lisinopril and carvedilol; follow blood pressure and adjust regimen as indicated.  3.  Other problems and plans as per the resident physician's note.

## 2013-04-06 NOTE — Care Management Note (Signed)
    Page 1 of 1   04/06/2013     3:36:07 PM   CARE MANAGEMENT NOTE 04/06/2013  Patient:  Roger Martinez, Roger Martinez   Account Number:  [id number]  Date Initiated:  04/06/2013  Documentation initiated by:  Donn Pierini  Subjective/Objective Assessment:   Pt admitted with CHF     Action/Plan:   PTA pt lived at home with wife   Anticipated DC Date:  04/07/2013   Anticipated DC Plan:  HOME/SELF CARE      DC Planning Services  CM consult      Choice offered to / List presented to:             Status of service:  Completed, signed off Medicare Important Message given?   (If response is "NO", the following Medicare IM given date fields will be blank) Date Medicare IM given:   Date Additional Medicare IM given:    Discharge Disposition:  HOME/SELF CARE  Per UR Regulation:  Reviewed for med. necessity/level of care/duration of stay  If discussed at Long Length of Stay Meetings, dates discussed:    Comments:  04/06/13- 1530- Donn Pierini RN, BSN [phone] Referral for medication needs- spoke with pt at bedside- confirmed that pt does have insurance - per pt his meds are on the $4 list at [facility] and he does not typically have difficulty affording them - however he was having difficulty with his PCP copays- discussed making sure his PCP is in-network with his insurance- provided pt with [facility] Connect number to assist him in finding a PCP for his insurance-  due to pt having insurance he does not qualify for any assistance with medications.

## 2013-04-06 NOTE — Progress Notes (Signed)
  Echocardiogram 2D Echocardiogram has been performed.  Roger Martinez FRANCES 04/06/2013, 2:30 PM

## 2013-04-07 DIAGNOSIS — I5020 Unspecified systolic (congestive) heart failure: Secondary | ICD-10-CM

## 2013-04-07 NOTE — Plan of Care (Signed)
Problem: Discharge Progression Outcomes Goal: Discharge plan in place and appropriate Outcome: Not Met (add Reason) Patient left against medical advice.

## 2013-04-07 NOTE — Progress Notes (Signed)
Patient stated he could not stay until Monday as the physician requested. He removed telemetry leads and signed AMA form. MD notified. Gildardo Cranker, RN.

## 2013-04-07 NOTE — Progress Notes (Signed)
Subjective:  Pt had 10 beats of VTach while sleeping. Asymptomatic. Patient has done well since restarting hims home meds. He believes that he is ready to go home.  Objective: Vital signs in last 24 hours: Filed Vitals:   04/06/13 1400 04/06/13 1759 04/06/13 2111 04/07/13 0435  BP: 149/91 142/70 128/67 151/74  Pulse: 71  79 93  Temp: 97.9 F (36.6 C)  98.1 F (36.7 C) 97.4 F (36.3 C)  TempSrc: Oral  Oral Oral  Resp: 18  18 18   Height:      Weight:      SpO2: 97%  96% 94%   Weight change:   Intake/Output Summary (Last 24 hours) at 04/07/13 4098 Last data filed at 04/07/13 0400  Gross per 24 hour  Intake      0 ml  Output    400 ml  Net   -400 ml   Physical Exam  Constitutional: He is oriented to person, place, and time. He appears well-developed and well-nourished.  HENT:  Head: Normocephalic and atraumatic.  Cardiovascular: Normal rate, regular rhythm, normal heart sounds and intact distal pulses.   No murmur heard. Pulmonary/Chest: Effort normal and breath sounds normal. No respiratory distress. He has no wheezes. He has no rales.  Abdominal: Soft. Bowel sounds are normal. He exhibits no distension. There is no tenderness.  Neurological: He is alert and oriented to person, place, and time.  Psychiatric: He has a normal mood and affect. His behavior is normal.     Lab Results: Basic Metabolic Panel:  Recent Labs Lab 04/05/13 2004 04/06/13 0202  NA 139 141  K 3.8 3.7  CL 106 104  CO2 21 24  GLUCOSE 97 156*  BUN 17 17  CREATININE 1.25 1.21  CALCIUM 8.9 9.3  MG  --  1.9  PHOS  --  2.3   Liver Function Tests:  Recent Labs Lab 04/05/13 2004  AST 39*  ALT 25  ALKPHOS 120*  BILITOT 0.5  PROT 7.1  ALBUMIN 3.5  CBC:  Recent Labs Lab 04/05/13 2004 04/06/13 0202  WBC 11.4* 10.8*  NEUTROABS 9.1* 9.9*  HGB 14.9 15.3  HCT 43.6 44.1  MCV 91.0 90.6  PLT 244 253   Cardiac Enzymes:  Recent Labs Lab 04/05/13 2015 04/06/13 0202 04/06/13 0756    TROPONINI <0.30 <0.30 <0.30   BNP:  Recent Labs Lab 04/05/13 2015  PROBNP 4534.0*   Fasting Lipid Panel:  Recent Labs Lab 04/06/13 0202  CHOL 171  HDL 46  LDLCALC 118*  TRIG 34  CHOLHDL 3.7   Urine Drug Screen: Drugs of Abuse     Component Value Date/Time   LABOPIA NONE DETECTED 04/05/2013 2106   COCAINSCRNUR NONE DETECTED 04/05/2013 2106   LABBENZ NONE DETECTED 04/05/2013 2106   AMPHETMU NONE DETECTED 04/05/2013 2106   THCU POSITIVE* 04/05/2013 2106   LABBARB NONE DETECTED 04/05/2013 2106    Urinalysis:  Recent Labs Lab 04/05/13 2106  COLORURINE YELLOW  LABSPEC 1.017  PHURINE 5.0  GLUCOSEU NEGATIVE  HGBUR SMALL*  BILIRUBINUR NEGATIVE  KETONESUR NEGATIVE  PROTEINUR 100*  UROBILINOGEN 0.2  NITRITE NEGATIVE  LEUKOCYTESUR NEGATIVE     Micro Results: No results found for this or any previous visit (from the past 240 hour(s)). Studies/Results: Dg Chest 2 View  04/05/2013   CLINICAL DATA:  Shortness of breath, cough and weakness.  EXAM: CHEST - 2 VIEW  COMPARISON:  01/09/2012.  FINDINGS: Stable cardiomegaly noted with evidence of prior CABG. There is evidence of  congestive heart failure with mild to moderate edema present as well as a small right pleural effusion. No airspace consolidation is identified.  IMPRESSION: Cardiomegaly and mild to moderate CHF with small right pleural effusion.   Electronically Signed   By: Irish Lack M.D.   On: 04/05/2013 20:36   Medications: I have reviewed the patient's current medications. Scheduled Meds: . aspirin EC  325 mg Oral Daily  . carvedilol  25 mg Oral BID WC  . enoxaparin (LOVENOX) injection  40 mg Subcutaneous Q24H  . lisinopril  40 mg Oral Daily  . nicotine  14 mg Transdermal Q24H  . simvastatin  40 mg Oral q1800  . sodium chloride  3 mL Intravenous Q12H  . sodium chloride  3 mL Intravenous Q12H   Continuous Infusions:  PRN Meds:.sodium chloride, acetaminophen, sodium  chloride Assessment/Plan: Principal Problem:   CHF, acute on chronic-unspecified systolic or diastolic pending records and echo Active Problems:   Chest tightness   SOB (shortness of breath)   Long QT interval   HTN (hypertension)   H/O medication noncompliance   Cardiomegaly   Pleural effusion, right (small)   CAD, multiple vessel s/p CABG 2011    Systolic Heart Failure (ef 30-35%) The patient has sCHF as diagnosed by echocardiogram on 12/16. THis is likely due to known multivessel CAD that is s/p CABG in 2011. Patient has responded well to being placed back on his home meds, which he had stop taking 1 month ago.  Echo Results: Left ventricle: The cavity size was normal. Wall thickness was increased in a pattern of severe LVH. Systolic function was moderately to severely reduced. The estimated ejection fraction was in the range of 30% to 35%. Diffuse hypokinesis.  - Continue ASA - Continue Coreg 25 mg BID - Discontinue HCTZ and start lasix - Continue lisinopril 40 mg qd - Continue Statin daily - Consult cards for recs regarding ICD, spironolactone, nitrates, and hydralazine  Prolonged QT Patient has QT of 405 and QTc of 495. Likely not significant.   Dispo: Disposition is deferred at this time, awaiting improvement of current medical problems.  Anticipated discharge in approximately 2-3 day(s).   The patient does not have a current PCP (Pcp Not In System) and does need an [facility] hospital follow-up appointment after discharge.  The patient does not have transportation limitations that hinder transportation to clinic appointments.  .Services Needed at time of discharge: Y = Yes, Blank = No PT:   OT:   RN:   Equipment:   Other:     LOS: 2 days   Pleas Koch, MD 04/07/2013, 6:52 AM

## 2013-04-07 NOTE — Discharge Summary (Signed)
Name: Roger Martinez MRN: [id number] DOB: 1965/07/19 47 y.o. PCP: Pcp Not In System  Date of Admission: 04/05/2013  7:10 PM Date of Discharge: 04/07/2013 Attending Physician: No att. providers found  Discharge Diagnosis:  Principal Problem:   CHF, acute on chronic-unspecified systolic or diastolic pending records and echo Active Problems:   Chest tightness   SOB (shortness of breath)   Long QT interval   HTN (hypertension)   H/O medication noncompliance   Cardiomegaly   Pleural effusion, right (small)   CAD, multiple vessel s/p CABG 2011   Discharge Medications: Patient Left AMA, planned to update medications list before discharge due to new diagnosis of sCHF with EF of 30-35%. The medications at time of leaving AMA are below.   Medication List    ASK your doctor about these medications       aspirin EC 325 MG tablet  Take 325 mg by mouth daily.     carvedilol 25 MG tablet  Commonly known as:  COREG  Take 25 mg by mouth 2 (two) times daily with a meal.     hydrochlorothiazide 25 MG tablet  Commonly known as:  HYDRODIURIL  Take 25 mg by mouth daily.     lisinopril 40 MG tablet  Commonly known as:  PRINIVIL,ZESTRIL  Take 40 mg by mouth daily.     pravastatin 40 MG tablet  Commonly known as:  PRAVACHOL  Take 40 mg by mouth daily.        Disposition and follow-up:   [name] left AMA. He was discharged from [facility] in Fair condition.  At the hospital follow up visit please address:  1.  sCHF (EF 30-35%), HTN, CAD, medication non-compliance. Access to healthcare(patient will have insurance starting jan 1)  2.  Labs / imaging needed at time of follow-up: Lipid Panel, BMP, CBC  3.  Pending labs/ test needing follow-up: None  Follow-up Appointments:  Patient Needed follow up with [facility] and cardiology.  Discharge Instructions:  Not completed as patient left AMA.  Consultations:    Procedures Performed:  Dg Chest 2  View  04/05/2013   CLINICAL DATA:  Shortness of breath, cough and weakness.  EXAM: CHEST - 2 VIEW  COMPARISON:  01/09/2012.  FINDINGS: Stable cardiomegaly noted with evidence of prior CABG. There is evidence of congestive heart failure with mild to moderate edema present as well as a small right pleural effusion. No airspace consolidation is identified.  IMPRESSION: Cardiomegaly and mild to moderate CHF with small right pleural effusion.   Electronically Signed   By: Irish Lack M.D.   On: 04/05/2013 20:36    2D Echo: Study Conclusions  - Left ventricle: The cavity size was normal. Wall thickness was increased in a pattern of severe LVH. Systolic function was moderately to severely reduced. The estimated ejection fraction was in the range of 30% to 35%. Diffuse hypokinesis. - Aortic valve: Mild regurgitation. - Mitral valve: Mild regurgitation. - Left atrium: The atrium was moderately dilated. - Right atrium: The atrium was mildly dilated. - Pulmonary arteries: PA peak pressure: 37mm Hg (S).   Admission HPI: [name] is a 47 y.o. male w/ PMHx of CAD (CABG in 2011) and HTN, presents to the ED w/ complaints of worsening SOB. The patient says he has had SOB which started about 11 days ago and has been progressively getting worse. The patient claims he has also had a cough recently, dry in nature, but more recently, productive of  a scant, yellowish sputum. He also admits to recent worsening PND and orthopnea, some mild weight gain and slight increase in abdominal girth. The patient has had some difficulty obtaining his medications lately and has attempted to "stretch" out his BP medications recently, causing him to be non-compliant and out of medications for the past 1 month. He denies any chest pain, dizziness, lightheadedness, palpitations, LE swelling, abdominal pain, nausea, vomiting, fever, or chills. 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   Hospital Course by problem list: Principal Problem:   CHF, acute on chronic-unspecified systolic or diastolic pending records and echo Active Problems:   Chest tightness   SOB (shortness of breath)   Long QT interval   HTN (hypertension)   H/O medication noncompliance   Cardiomegaly   Pleural effusion, right (small)   CAD, multiple vessel s/p CABG 2011    1. Patient Left AMA. So his workup, medication adjustment and discharge recommendations were not complete.  Systolic Heart Failure (ef 30-35%) c/b SOB The patient has sCHF as diagnosed by echocardiogram on 12/16. This is likely due to known multivessel CAD that is s/p CABG in 2011. Patient has responded well to being placed back on his home meds, which he had stop taking 1 month ago. His SOB has resolved and he is feeling much better. His volume status appears to be normalizing. At the time of leaving AMA he was on ASA, Coreg 25 mg BID, lasix 40 mg IV qd, lisinopril 40 mg daily. HCTZ had been discontinued. I spent at least 20 minutes discussing the need to stay to have a cardiologist see the patient as his EF was severely reduced necessitating updates to his medications and probable scheduling for implantation of ICD vs wearing a lifevest. The patient was concerned that he may lose his job if he did not leave today. He stated that he would stay until seen by cardiology.   Discharge Vitals:   BP 151/74  Pulse 93  Temp(Src) 97.4 F (36.3 C) (Oral)  Resp 18  Ht 5\' 11"  (1.803 m)  Wt 203 lb 3.2 oz (92.171 kg)  BMI 28.35 kg/m2  SpO2 94%  Discharge Labs:  No results found for this or any previous visit (from the past 24 hour(s)).  Signed: Pleas Koch, MD 04/07/2013, 6:11 PM   Time Spent on Discharge: 15 minutes Services Ordered on Discharge: None Equipment Ordered on Discharge: None

## 2013-04-11 ENCOUNTER — Telehealth: Payer: Self-pay | Admitting: Cardiology

## 2013-04-11 NOTE — Telephone Encounter (Signed)
ROI faxed to [facility] Office at [phone]

## 2013-04-16 ENCOUNTER — Telehealth: Payer: Self-pay | Admitting: Cardiology

## 2013-04-16 NOTE — Telephone Encounter (Signed)
Records rec From [facility] Office gave to Scheduling Dept 04/16/13/kdm

## 2013-05-07 ENCOUNTER — Ambulatory Visit: Payer: BC Managed Care – PPO | Admitting: Cardiology

## 2013-05-08 ENCOUNTER — Ambulatory Visit (INDEPENDENT_AMBULATORY_CARE_PROVIDER_SITE_OTHER): Payer: BC Managed Care – PPO | Admitting: Cardiology

## 2013-05-08 ENCOUNTER — Encounter: Payer: Self-pay | Admitting: Cardiology

## 2013-05-08 VITALS — BP 179/111 | HR 80 | Ht 71.0 in | Wt 206.0 lb

## 2013-05-08 DIAGNOSIS — I25810 Atherosclerosis of coronary artery bypass graft(s) without angina pectoris: Secondary | ICD-10-CM

## 2013-05-08 DIAGNOSIS — Z91199 Patient's noncompliance with other medical treatment and regimen due to unspecified reason: Secondary | ICD-10-CM

## 2013-05-08 DIAGNOSIS — I10 Essential (primary) hypertension: Secondary | ICD-10-CM

## 2013-05-08 DIAGNOSIS — Z9119 Patient's noncompliance with other medical treatment and regimen: Secondary | ICD-10-CM

## 2013-05-08 DIAGNOSIS — I5022 Chronic systolic (congestive) heart failure: Secondary | ICD-10-CM

## 2013-05-08 MED ORDER — CARVEDILOL 25 MG PO TABS: 25.0000 mg | ORAL_TABLET | Freq: Two times a day (BID) | ORAL | Status: DC

## 2013-05-08 MED ORDER — SIMVASTATIN 20 MG PO TABS: 40.0000 mg | ORAL_TABLET | Freq: Every day | ORAL | Status: DC

## 2013-05-08 MED ORDER — LISINOPRIL 40 MG PO TABS: 20.0000 mg | ORAL_TABLET | Freq: Every day | ORAL | Status: DC

## 2013-05-08 MED ORDER — POTASSIUM CHLORIDE CRYS ER 20 MEQ PO TBCR: 20.0000 meq | EXTENDED_RELEASE_TABLET | Freq: Two times a day (BID) | ORAL | Status: DC

## 2013-05-08 MED ORDER — FUROSEMIDE 40 MG PO TABS: 40.0000 mg | ORAL_TABLET | Freq: Two times a day (BID) | ORAL | Status: DC

## 2013-05-08 NOTE — Progress Notes (Signed)
[place] [address] [place], [place]  [address] Phone: [phone] Fax:  [phone]  Date:  05/08/2013   ID:  Roger Martinez, DOB Oct 08, 1965, MRN [id number]  PCP:  Pcp Not In System   History of Present Illness: Roger Martinez is a 48 y.o. male with prior history of three-vessel bypass in 2011, coronary artery disease, hypertension here to establish care. Patient previously left AMA from hospital on 04/07/13 with new diagnosis of systolic heart failure with ejection fraction of 30-35%. Discharge medications reviewed.   Wt Readings from Last 3 Encounters:  05/08/13 206 lb (93.441 kg)  04/05/13 203 lb 3.2 oz (92.171 kg)     Past Medical History  Diagnosis Date  . Coronary artery disease   . Hypertension   . Congestive heart failure     Past Surgical History  Procedure Laterality Date  . Bypass graft  2011    3 vessel    Current Outpatient Prescriptions  Medication Sig Dispense Refill  . aspirin EC 325 MG tablet Take 325 mg by mouth daily.      . carvedilol (COREG) 25 MG tablet Take 25 mg by mouth 2 (two) times daily with a meal.      . hydrochlorothiazide (HYDRODIURIL) 25 MG tablet Take 25 mg by mouth daily.      Marland Kitchen lisinopril (PRINIVIL,ZESTRIL) 40 MG tablet Take 40 mg by mouth daily.      . nitroGLYCERIN (NITROSTAT) 0.4 MG SL tablet Place 0.4 mg under the tongue every 5 (five) minutes as needed for chest pain.      . pravastatin (PRAVACHOL) 40 MG tablet Take 40 mg by mouth daily.      . simvastatin (ZOCOR) 20 MG tablet Take 20 mg by mouth daily.       No current facility-administered medications for this visit.    Allergies:   No Known Allergies  Social History:  The patient  reports that he quit smoking about 4 weeks ago. His smoking use included Cigarettes. He has a 7.5 pack-year smoking history. He does not have any smokeless tobacco history on file. He reports that he drinks alcohol. He reports that he uses illicit drugs (Marijuana). truck  driver  Family History  Problem Relation Age of Onset  . Hypertension Mother   . Diabetes Mother     ROS:  Please see the history of present illness.   Denies any syncope, bleeding, positive orthopnea, positive PND, no fevers, chills, rash   All other systems reviewed and negative.   PHYSICAL EXAM: VS:  BP 179/111  Pulse 80  Ht 5\' 11"  (1.803 m)  Wt 206 lb (93.441 kg)  BMI 28.74 kg/m2 Well nourished, well developed, in no acute distress HEENT: normal, [place]/AT, EOMI Neck: Need neckJVD, normal carotid upstroke, no bruit Cardiac:  normal S1, S2; positive S3, palpable apex, RRR; no murmur Lungs:  clear to auscultation bilaterally, no wheezing, rhonchi or ralesI do not appreciate any crackles on exam. Abd: soft, nontender, no hepatomegaly, no bruits Ext: no edema, 2+ distal pulses Skin: warm and dry GU: deferred Neuro: no focal abnormalities noted, AAO x 3  EKG:  05/08/13 :Sinus rhythm rate 80 with T-wave inversion through precordial leads as well as inferior leads, possible ischemia. No significant change from prior.  Lab work, hospital records extensively reviewed. 04/06/13-creatinine 1.21, troponin normal, BNP 4500, LDL 118, hemoglobin 15.3  Echocardiogram:   04/06/13  - Left ventricle: The cavity size was normal. Wall thickness was increased  in a pattern of severe LVH. Systolic function was moderately to severely reduced. The estimated ejection fraction was in the range of 30% to 35%. Diffuse hypokinesis. - Aortic valve: Mild regurgitation. - Mitral valve: Mild regurgitation. - Left atrium: The atrium was moderately dilated. - Right atrium: The atrium was mildly dilated. - Pulmonary arteries: PA peak pressure: 65mm Hg (S).   ASSESSMENT AND PLAN:  1. Newly discovered cardiomyopathy/chronic systolic heart failure-EF 30-35%. Admitted December 2014 with acute systolic heart failure. Previous noncompliance. Discussed with him at length the importance of taking his medications  from the mortality standpoint. We will prescribe. Positive orthopnea. I will give him Lasix 40 mg twice a day with potassium 20 mEq twice a day as well. I will have him come back in one week, APP clinic. We will resume his carvedilol as well as lisinopril at 20 mg. I will also resume statin, simvastatin 40 mg. Check a basic metabolic profile in one week. I explained to him that if his ejection fraction does not improve after medical therapy in 3 months and any need cardiac catheterization. He stated that he would not go that route. We will continue to treat medically. Hopefully he will comply with medical compliance. Low-salt diet. Fluid restriction. He tolerated the above dosages at his last hospitalization. 2. Coronary artery disease-post bypass in [facility].  Signed, Candee Furbish, MD [facility]  05/08/2013 12:47 PM

## 2013-05-08 NOTE — Patient Instructions (Signed)
Your physician has recommended you make the following change in your medication:   1. Stop Hydrochlorothiazide 2. Stop Pravastatin 3. Decrease Lisinopril to 20 mg once daily. 4. Increase Simvastatin to 40 mg once daily. 5. Start Lasix 40 mg twice daily. 6. Start Potassium 20 meq twice daily.  Your physician recommends that you return for lab work in: 1 week, BMET   Your physician recommends that you schedule a follow-up appointment in: 1 week Dr. [name] or PA / NP.

## 2013-05-09 ENCOUNTER — Encounter: Payer: Self-pay | Admitting: Cardiology

## 2013-05-25 ENCOUNTER — Ambulatory Visit (INDEPENDENT_AMBULATORY_CARE_PROVIDER_SITE_OTHER): Payer: BC Managed Care – PPO | Admitting: Nurse Practitioner

## 2013-05-25 ENCOUNTER — Other Ambulatory Visit: Payer: BC Managed Care – PPO

## 2013-05-25 ENCOUNTER — Encounter: Payer: Self-pay | Admitting: Nurse Practitioner

## 2013-05-25 VITALS — BP 180/120 | HR 73 | Ht 71.0 in | Wt 215.8 lb

## 2013-05-25 DIAGNOSIS — R0600 Dyspnea, unspecified: Secondary | ICD-10-CM

## 2013-05-25 DIAGNOSIS — I255 Ischemic cardiomyopathy: Secondary | ICD-10-CM

## 2013-05-25 DIAGNOSIS — R0989 Other specified symptoms and signs involving the circulatory and respiratory systems: Secondary | ICD-10-CM

## 2013-05-25 DIAGNOSIS — I2589 Other forms of chronic ischemic heart disease: Secondary | ICD-10-CM

## 2013-05-25 DIAGNOSIS — R0609 Other forms of dyspnea: Secondary | ICD-10-CM

## 2013-05-25 LAB — BASIC METABOLIC PANEL
BUN: 14 mg/dL (ref 6–23)
CO2: 23 mEq/L (ref 19–32)
Calcium: 9.5 mg/dL (ref 8.4–10.5)
Chloride: 114 mEq/L — ABNORMAL HIGH (ref 96–112)
Creatinine, Ser: 1.1 mg/dL (ref 0.4–1.5)
GFR: 89.93 mL/min (ref >60.00)
Glucose, Bld: 92 mg/dL (ref 70–99)
Potassium: 4.4 mEq/L (ref 3.5–5.1)
Sodium: 147 mEq/L — ABNORMAL HIGH (ref 135–145)

## 2013-05-25 LAB — BRAIN NATRIURETIC PEPTIDE: Pro B Natriuretic peptide (BNP): 1074 pg/mL — ABNORMAL HIGH (ref 0.0–100.0)

## 2013-05-25 MED ORDER — NITROGLYCERIN 0.4 MG SL SUBL: 0.4000 mg | SUBLINGUAL_TABLET | SUBLINGUAL | Status: DC | PRN

## 2013-05-25 MED ORDER — HYDRALAZINE HCL 50 MG PO TABS: 50.0000 mg | ORAL_TABLET | Freq: Three times a day (TID) | ORAL | Status: DC

## 2013-05-25 NOTE — Patient Instructions (Addendum)
Stay on your current medicines but I am adding Hydralazine 50 mg to take 3 times a day - morning/afternoon/evening  Come and get your blood pressure rechecked Monday morning at 7:30am  See me in 10 to 14 days  We will check labs today  Try to weigh every day  Avoid salt as much as possible.   Call the [place] office at [phone] if you have any questions, problems or concerns.

## 2013-05-25 NOTE — Progress Notes (Signed)
[name] Date of Birth: 1965-06-28 Medical Record #[id number]  History of Present Illness: [name] is seen back today for a 2 week check. Seen for Dr. [name]. He has known CAD with CABG back in 2011, HTN and noncompliance. Admitted back in December with heart failure. EF 30 to 35%. Left AMA.  He does smoke and uses marijuana. Previously seen by Dr. [name].  Seen 2 weeks ago - Dr. [name] restarted his medicines.   Comes back today. Here alone. Tells me that he has been taking his medicines but did not have them today - said he had not eaten - but then says he ate a piece of [employer] chicken. No chest pain. Says his breathing is ok. No swelling. Says he is smoking less but continues to smoke and use marijuana. Asking for nicotine patches. Says he does not drink alcohol but apparently has in the past.    Current Outpatient Prescriptions  Medication Sig Dispense Refill  . aspirin EC 325 MG tablet Take 325 mg by mouth daily.      . carvedilol (COREG) 25 MG tablet Take 1 tablet (25 mg total) by mouth 2 (two) times daily with a meal.  60 tablet  3  . furosemide (LASIX) 40 MG tablet Take 1 tablet (40 mg total) by mouth 2 (two) times daily.  60 tablet  3  . lisinopril (PRINIVIL,ZESTRIL) 40 MG tablet Take 0.5 tablets (20 mg total) by mouth daily.  30 tablet  3  . potassium chloride SA (KLOR-CON M20) 20 MEQ tablet Take 1 tablet (20 mEq total) by mouth 2 (two) times daily.  60 tablet  3  . simvastatin (ZOCOR) 20 MG tablet Take 2 tablets (40 mg total) by mouth daily.  60 tablet  3  . hydrALAZINE (APRESOLINE) 50 MG tablet Take 1 tablet (50 mg total) by mouth 3 (three) times daily.  90 tablet  3  . nitroGLYCERIN (NITROSTAT) 0.4 MG SL tablet Place 1 tablet (0.4 mg total) under the tongue every 5 (five) minutes as needed for chest pain.  25 tablet  6   No current facility-administered medications for this visit.    No Known Allergies  Past Medical History  Diagnosis Date  . Coronary artery  disease   . Hypertension   . Congestive heart failure   . Other and unspecified hyperlipidemia   . Anxiety state, unspecified   . MI (myocardial infarction) 03/2011    Past Surgical History  Procedure Laterality Date  . Bypass graft  2011    3 vessel    History  Smoking status  . Current Every Day Smoker -- 0.50 packs/day for 15 years  . Types: Cigarettes  . Last Attempt to Quit: 04/05/2013  Smokeless tobacco  . Not on file    History  Alcohol Use  . Yes    Comment: ocassionaly    Family History  Problem Relation Age of Onset  . Hypertension Mother   . Diabetes Mother     Review of Systems: The review of systems is per the HPI.  All other systems were reviewed and are negative.  Physical Exam: BP 180/120  Pulse 73  Ht 5\' 11"  (1.803 m)  Wt 215 lb 12.8 oz (97.886 kg)  BMI 30.11 kg/m2  SpO2 95% Patient is alert and in no acute distress. Weight is up but does have on heavier clothes today. Skin is warm and dry. Color is normal.  HEENT is unremarkable. Normocephalic/atraumatic. PERRL. Sclera are nonicteric. Neck is  supple. No masses. No JVD. Lungs are clear. Cardiac exam shows a regular rate and rhythm. Abdomen is soft. Extremities are without edema. Gait and ROM are intact. No gross neurologic deficits noted.  Wt Readings from Last 3 Encounters:  05/25/13 215 lb 12.8 oz (97.886 kg)  05/08/13 206 lb (93.441 kg)  04/05/13 203 lb 3.2 oz (92.171 kg)      LABORATORY DATA: BMET and BNP pending  Lab Results  Component Value Date   WBC 10.8* 04/06/2013   HGB 15.3 04/06/2013   HCT 44.1 04/06/2013   PLT 253 04/06/2013   GLUCOSE 156* 04/06/2013   CHOL 171 04/06/2013   TRIG 34 04/06/2013   HDL 46 04/06/2013   LDLCALC 118* 04/06/2013   ALT 25 04/05/2013   AST 39* 04/05/2013   NA 141 04/06/2013   K 3.7 04/06/2013   CL 104 04/06/2013   CREATININE 1.21 04/06/2013   BUN 17 04/06/2013   CO2 24 04/06/2013    Echo Study Conclusions from December 2014 - Left  ventricle: The cavity size was normal. Wall thickness was increased in a pattern of severe LVH. Systolic function was moderately to severely reduced. The estimated ejection fraction was in the range of 30% to 35%. Diffuse hypokinesis. - Aortic valve: Mild regurgitation. - Mitral valve: Mild regurgitation. - Left atrium: The atrium was moderately dilated. - Right atrium: The atrium was mildly dilated. - Pulmonary arteries: PA peak pressure: 1mm Hg (S).   Assessment / Plan: 1. Ischemic CM with chronic systolic HF - EF of 30 to 35% - on ACE, BB and diuretic. Check BNP and BMET today - would consider aldactone if potassium not an issue. Hydralazine added today. Needs to restrict his salt. Encouraged to weigh daily. Consider ICD after 3 months of therapy if able to show compliance.   2. HTN - uncontrolled - Hydralazine is added today at 50 mg TID - arrange nurse visit for Monday/Tuesday next week (unfortunately, no spots available - my CMA will recheck on Monday morning at 7:30 am - I will see the following week.   3. Noncompliance - I am not convinced that he is willing to be compliance - the seriousness of his problem was reiterated today.   4. Tobacco abuse - says he is trying to stop and may use Nicotine patches.   Patient is agreeable to this plan and will call if any problems develop in the interim.   Burtis Junes, RN, [facility] [address] [place], [place]  [address] [phone]

## 2013-05-28 ENCOUNTER — Telehealth: Payer: Self-pay | Admitting: *Deleted

## 2013-05-28 ENCOUNTER — Other Ambulatory Visit: Payer: BC Managed Care – PPO

## 2013-05-28 DIAGNOSIS — I5022 Chronic systolic (congestive) heart failure: Secondary | ICD-10-CM

## 2013-05-28 NOTE — Telephone Encounter (Signed)
Pt was suppose to come in this am for a bp check by me, was no nurse's visit open so [name], NP stated just tell front dest that your here to see Andee Poles for a bp check. Pt showed up and Leanne at check in made pt appointment for lab which pt did not need pt had labs drawn had an old order by Dr. Kingsley Plan  and never had bp checked,  stated that to [facility] and stated apology. Also t/w pt and forgot pt was here to get BP checked.  I stated pt will have repeat lab work when pt comes back for appt. Pt stated that was never made, I stated already made appt when pt was seen on last ov

## 2013-06-08 ENCOUNTER — Ambulatory Visit: Payer: BC Managed Care – PPO | Admitting: Nurse Practitioner

## 2013-06-17 ENCOUNTER — Emergency Department (HOSPITAL_COMMUNITY)
Admission: EM | Admit: 2013-06-17 | Discharge: 2013-06-18 | Disposition: A | Discharge status: Home / Self Care | Payer: BC Managed Care – PPO | Attending: Emergency Medicine | Admitting: Emergency Medicine

## 2013-06-17 ENCOUNTER — Encounter (HOSPITAL_COMMUNITY): Payer: Self-pay | Admitting: Emergency Medicine

## 2013-06-17 ENCOUNTER — Other Ambulatory Visit: Payer: Self-pay

## 2013-06-17 DIAGNOSIS — I509 Heart failure, unspecified: Secondary | ICD-10-CM | POA: Insufficient documentation

## 2013-06-17 DIAGNOSIS — Z951 Presence of aortocoronary bypass graft: Secondary | ICD-10-CM | POA: Insufficient documentation

## 2013-06-17 DIAGNOSIS — R030 Elevated blood-pressure reading, without diagnosis of hypertension: Secondary | ICD-10-CM

## 2013-06-17 DIAGNOSIS — Z79899 Other long term (current) drug therapy: Secondary | ICD-10-CM | POA: Insufficient documentation

## 2013-06-17 DIAGNOSIS — F17200 Nicotine dependence, unspecified, uncomplicated: Secondary | ICD-10-CM | POA: Insufficient documentation

## 2013-06-17 DIAGNOSIS — E785 Hyperlipidemia, unspecified: Secondary | ICD-10-CM | POA: Insufficient documentation

## 2013-06-17 DIAGNOSIS — Z8659 Personal history of other mental and behavioral disorders: Secondary | ICD-10-CM | POA: Insufficient documentation

## 2013-06-17 DIAGNOSIS — I10 Essential (primary) hypertension: Secondary | ICD-10-CM | POA: Insufficient documentation

## 2013-06-17 DIAGNOSIS — R141 Gas pain: Secondary | ICD-10-CM | POA: Insufficient documentation

## 2013-06-17 DIAGNOSIS — I2510 Atherosclerotic heart disease of native coronary artery without angina pectoris: Secondary | ICD-10-CM | POA: Insufficient documentation

## 2013-06-17 DIAGNOSIS — Z7982 Long term (current) use of aspirin: Secondary | ICD-10-CM | POA: Insufficient documentation

## 2013-06-17 DIAGNOSIS — R142 Eructation: Secondary | ICD-10-CM | POA: Insufficient documentation

## 2013-06-17 DIAGNOSIS — R143 Flatulence: Secondary | ICD-10-CM

## 2013-06-17 DIAGNOSIS — R1013 Epigastric pain: Secondary | ICD-10-CM | POA: Insufficient documentation

## 2013-06-17 DIAGNOSIS — I252 Old myocardial infarction: Secondary | ICD-10-CM | POA: Insufficient documentation

## 2013-06-17 LAB — COMPREHENSIVE METABOLIC PANEL
ALBUMIN: 3.6 g/dL (ref 3.5–5.2)
ALT: 10 U/L (ref 0–53)
AST: 21 U/L (ref 0–37)
Alkaline Phosphatase: 114 U/L (ref 39–117)
BUN: 15 mg/dL (ref 6–23)
CHLORIDE: 104 meq/L (ref 96–112)
CO2: 23 mEq/L (ref 19–32)
Calcium: 10 mg/dL (ref 8.4–10.5)
Creatinine, Ser: 1.07 mg/dL (ref 0.50–1.35)
GFR calc non Af Amer: 82 mL/min — ABNORMAL LOW (ref >90)
Glucose, Bld: 102 mg/dL — ABNORMAL HIGH (ref 70–99)
Potassium: 4 mEq/L (ref 3.7–5.3)
SODIUM: 140 meq/L (ref 137–147)
TOTAL PROTEIN: 7.3 g/dL (ref 6.0–8.3)
Total Bilirubin: 0.3 mg/dL (ref 0.3–1.2)

## 2013-06-17 LAB — CBC WITH DIFFERENTIAL/PLATELET
Basophils Absolute: 0 10*3/uL (ref 0.0–0.1)
Basophils Relative: 0 % (ref 0–1)
EOS ABS: 0.3 10*3/uL (ref 0.0–0.7)
Eosinophils Relative: 4 % (ref 0–5)
HCT: 45.6 % (ref 39.0–52.0)
Hemoglobin: 15.8 g/dL (ref 13.0–17.0)
LYMPHS ABS: 2.4 10*3/uL (ref 0.7–4.0)
Lymphocytes Relative: 28 % (ref 12–46)
MCH: 30.7 pg (ref 26.0–34.0)
MCHC: 34.6 g/dL (ref 30.0–36.0)
MCV: 88.5 fL (ref 78.0–100.0)
Monocytes Absolute: 0.7 10*3/uL (ref 0.1–1.0)
Monocytes Relative: 8 % (ref 3–12)
NEUTROS ABS: 5.2 10*3/uL (ref 1.7–7.7)
NEUTROS PCT: 61 % (ref 43–77)
PLATELETS: 278 10*3/uL (ref 150–400)
RBC: 5.15 MIL/uL (ref 4.22–5.81)
RDW: 14.3 % (ref 11.5–15.5)
WBC: 8.7 10*3/uL (ref 4.0–10.5)

## 2013-06-17 LAB — URINALYSIS, ROUTINE W REFLEX MICROSCOPIC
BILIRUBIN URINE: NEGATIVE
GLUCOSE, UA: NEGATIVE mg/dL
Hgb urine dipstick: NEGATIVE
KETONES UR: NEGATIVE mg/dL
Leukocytes, UA: NEGATIVE
Nitrite: NEGATIVE
Protein, ur: NEGATIVE mg/dL
Specific Gravity, Urine: 1.013 (ref 1.005–1.030)
Urobilinogen, UA: 0.2 mg/dL (ref 0.0–1.0)
pH: 7 (ref 5.0–8.0)

## 2013-06-17 LAB — TROPONIN I: Troponin I: <0.3 ng/mL (ref <0.30)

## 2013-06-17 LAB — LIPASE, BLOOD: LIPASE: 58 U/L (ref 11–59)

## 2013-06-17 MED ORDER — FENTANYL CITRATE 0.05 MG/ML IJ SOLN
50.0000 ug | Freq: Once | INTRAMUSCULAR | Status: AC
  Administered 2013-06-17: 50 ug via INTRAVENOUS
  Filled 2013-06-17: qty 2

## 2013-06-17 NOTE — ED Notes (Addendum)
Restless, wants to stand, last ate 1800, last BM 1600 (normal, but just a little), "feels better standing", keeps saying, "I need to pass gas". abd soft NT (assessed while sitting upright), no pulsations noted.

## 2013-06-17 NOTE — ED Notes (Addendum)
Pt reports L upper abdominal pain x 4 days. Pt reports 3 bm today that were normal for him (but does not normally go that many times). Pt reports feeling lightheaded earlier. Denies sob, nv. Pain gets worse when he sits down. Pt states that he has gas as well.

## 2013-06-17 NOTE — ED Notes (Signed)
Patient states he is having extreme belly pain.  Patient very restless, unable to be still.  Stated earlier he was standing up in triage with his arms on the wall and that helped to relieve some of his abd pain.

## 2013-06-18 LAB — TROPONIN I: Troponin I: <0.3 ng/mL (ref <0.30)

## 2013-06-18 MED ORDER — HYDROCODONE-ACETAMINOPHEN 5-325 MG PO TABS: 1.0000 | ORAL_TABLET | Freq: Every evening | ORAL | Status: DC | PRN

## 2013-06-18 MED ORDER — FAMOTIDINE 20 MG PO TABS: 20.0000 mg | ORAL_TABLET | Freq: Two times a day (BID) | ORAL | Status: DC

## 2013-06-18 NOTE — ED Provider Notes (Signed)
CSN: [id number]     Arrival date & time 06/17/13  2134 History   First MD Initiated Contact with Patient 06/17/13 2255     Chief Complaint  Patient presents with  . Abdominal Pain     (Consider location/radiation/quality/duration/timing/severity/associated sxs/prior Treatment) HPI Comments: Roger Martinez is a 48 y.o. male with a past medical history of HTN, CAD s/p CABG, CHF, presenting the Emergency Department with a chief complaint of abdominal pain for 5 days.  He described the discomfort as non-radiating, waxing and waning, "gas pain", rated a 20/10.  He reports fentanyl has relieved his symptoms and is currently 0/10 in the ED. He reports 3 non-bloody, BM today. He reports taking Gas-X at  On Tuesday without relief.  He denies aggravating factors, or association with food.Denies nausea or vomiting, chest pain or SOB.  He denies EtOH use or abuse.   The history is provided by the patient and medical records. No language interpreter was used.    Past Medical History  Diagnosis Date  . Coronary artery disease   . Hypertension   . Congestive heart failure   . Other and unspecified hyperlipidemia   . Anxiety state, unspecified   . MI (myocardial infarction) 03/2011   Past Surgical History  Procedure Laterality Date  . Bypass graft  2011    3 vessel   Family History  Problem Relation Age of Onset  . Hypertension Mother   . Diabetes Mother    History  Substance Use Topics  . Smoking status: Current Every Day Smoker -- 0.50 packs/day for 15 years    Types: Cigarettes    Last Attempt to Quit: 04/05/2013  . Smokeless tobacco: Not on file  . Alcohol Use: Yes     Comment: ocassionaly    Review of Systems  Constitutional: Negative for fever and chills.  Respiratory: Negative for cough and shortness of breath.   Cardiovascular: Negative for chest pain, palpitations and leg swelling.  Gastrointestinal: Positive for abdominal distention. Negative for nausea, vomiting, diarrhea,  constipation, blood in stool and anal bleeding.  Genitourinary: Negative for dysuria and hematuria.      Allergies  Review of patient's allergies indicates no known allergies.  Home Medications   Current Outpatient Rx  Name  Route  Sig  Dispense  Refill  . aspirin EC 325 MG tablet   Oral   Take 325 mg by mouth daily.         . carvedilol (COREG) 25 MG tablet   Oral   Take 1 tablet (25 mg total) by mouth 2 (two) times daily with a meal.   60 tablet   3   . furosemide (LASIX) 40 MG tablet   Oral   Take 1 tablet (40 mg total) by mouth 2 (two) times daily.   60 tablet   3   . hydrALAZINE (APRESOLINE) 50 MG tablet   Oral   Take 1 tablet (50 mg total) by mouth 3 (three) times daily.   90 tablet   3   . lisinopril (PRINIVIL,ZESTRIL) 40 MG tablet   Oral   Take 0.5 tablets (20 mg total) by mouth daily.   30 tablet   3   . nitroGLYCERIN (NITROSTAT) 0.4 MG SL tablet   Sublingual   Place 1 tablet (0.4 mg total) under the tongue every 5 (five) minutes as needed for chest pain.   25 tablet   6   . potassium chloride SA (KLOR-CON M20) 20 MEQ tablet   Oral  Take 1 tablet (20 mEq total) by mouth 2 (two) times daily.   60 tablet   3   . simvastatin (ZOCOR) 20 MG tablet   Oral   Take 2 tablets (40 mg total) by mouth daily.   60 tablet   3   . famotidine (PEPCID) 20 MG tablet   Oral   Take 1 tablet (20 mg total) by mouth 2 (two) times daily.   60 tablet   0   . HYDROcodone-acetaminophen (NORCO/VICODIN) 5-325 MG per tablet   Oral   Take 1 tablet by mouth at bedtime as needed.   10 tablet   0    BP 176/97  Pulse 72  Temp(Src) 98.4 F (36.9 C) (Oral)  Resp 16  SpO2 99% Physical Exam  Nursing note and vitals reviewed. Constitutional: He is oriented to person, place, and time. He appears well-developed and well-nourished. No distress.  HENT:  Head: Normocephalic and atraumatic.  Eyes: EOM are normal. Pupils are equal, round, and reactive to light. No  scleral icterus.  Neck: Neck supple.  Cardiovascular: Normal rate, regular rhythm and normal heart sounds.   No murmur heard. Pulses:      Radial pulses are 2+ on the right side, and 2+ on the left side.  Pulmonary/Chest: Effort normal and breath sounds normal. He has no wheezes.  Abdominal: Soft. Normal appearance and bowel sounds are normal. There is tenderness in the epigastric area. There is no rigidity, no rebound, no guarding, no tenderness at McBurney's point and negative Murphy's sign.    Musculoskeletal: Normal range of motion. He exhibits no edema.  Neurological: He is alert and oriented to person, place, and time.  Skin: Skin is warm and dry. No rash noted.  Psychiatric: He has a normal mood and affect.    ED Course  Procedures (including critical care time) Labs Review Labs Reviewed  COMPREHENSIVE METABOLIC PANEL - Abnormal; Notable for the following:    Glucose, Bld 102 (*)    GFR calc non Af Amer 82 (*)    All other components within normal limits  CBC WITH DIFFERENTIAL  URINALYSIS, ROUTINE W REFLEX MICROSCOPIC  TROPONIN I  LIPASE, BLOOD  TROPONIN I  I-STAT TROPOININ, ED   Imaging Review No results found.   EKG Interpretation   Date/Time:  Sunday June 17 2013 20:38:15 EDT Ventricular Rate:  82 PR Interval:  166 QRS Duration: 102 QT Interval:  418 QTC Calculation: 488 R Axis:   156 Text Interpretation:  Normal sinus rhythm Biatrial enlargement Right axis  deviation Pulmonary disease pattern Right ventricular hypertrophy ST \\T \ T  wave abnormality, consider inferolateral ischemia Prolonged QT Abnormal  ECG ED PHYSICIAN INTERPRETATION AVAILABLE IN [facility] Confirmed by  TEST, Record ([id number]) on 06/19/2013 7:27:04 AM      MDM   Final diagnoses:  Epigastric abdominal pain  Elevated blood pressure reading   Pt with a history of epigastric pain.  Mild tenderness on exam.  Afebrile, no leukocytosis. CMP without concerning abnormalities. Negative  troponin. Lipase for epigastric pain. Lipase negative. Pt remains asymptomatic in the ED, second troponin ordered. Discussed patient history, condition, and labs with Dr. Dierdre Highman.  After his evaluation of the patient advises pepcid and pain medication, f/u with GI. who agrees the patient can be evaluated as an out-pt. Negative troponin x2. Discussed lab results, imaging results, and treatment plan with the patient. Return precautions given. Reports understanding and no other concerns at this time.  Patient is stable for discharge at  this time.  Meds given in ED:  Medications  fentaNYL (SUBLIMAZE) injection 50 mcg (50 mcg Intravenous Given 06/17/13 2249)    Discharge Medication List as of 06/18/2013  2:37 AM    START taking these medications   Details  HYDROcodone-acetaminophen (NORCO/VICODIN) 5-325 MG per tablet Take 1 tablet by mouth at bedtime as needed., Starting 06/18/2013, Until Discontinued, Print       06/18/13 0000  famotidine (PEPCID) 20 MG tablet 2 times daily,         Lorrine Kin, PA-C 06/19/13 1453

## 2013-06-18 NOTE — Discharge Instructions (Signed)
Call for a follow up appointment with a Family or Primary Care Provider.  Call [facility] for further evaluation of your abdominal pain. Monitor your blood pressure, and follow up with your cardiologist as needed for your blood pressure medication.  Return if Symptoms worsen.   Take medication as prescribed.

## 2013-06-19 NOTE — ED Provider Notes (Signed)
Medical screening examination/treatment/procedure(s) were conducted as a shared visit with non-physician practitioner(s) and myself.  I personally evaluated the patient during the encounter.   EKG Interpretation   Date/Time:  Sunday June 17 2013 20:38:15 EDT Ventricular Rate:  82 PR Interval:  166 QRS Duration: 102 QT Interval:  418 QTC Calculation: 488 R Axis:   156 Text Interpretation:  Normal sinus rhythm Biatrial enlargement Right axis  deviation Pulmonary disease pattern Right ventricular hypertrophy ST \\T \ T  wave abnormality, consider inferolateral ischemia Prolonged QT Abnormal  ECG ED PHYSICIAN INTERPRETATION AVAILABLE IN [facility] Confirmed by  TEST, Record ([id number]) on 06/19/2013 7:27:04 AM      S: Epigatsric pain x 5 days, feel slike gas, gradual onset severe pain, no CP/ SOB/ Back pain, received fentanyl in triage. At time of my evaluation is pain free. O: Heart RRR, Lungs CTA, no LE edema, no ABD tenderness, no distention, and good bowel sounds.  A/P: Epigastric pain with negative serial troponins, pain free for hours after single does of fentanyl.  No acute ABD. Presentation does not suggest ACS. Rx provided and PT agrees to close outpatient PCP follow up.    Teressa Lower, MD 06/19/13 [phone]

## 2013-07-04 ENCOUNTER — Ambulatory Visit: Payer: BC Managed Care – PPO | Admitting: Nurse Practitioner

## 2013-09-09 ENCOUNTER — Telehealth: Payer: Self-pay | Admitting: Physician Assistant

## 2013-09-09 NOTE — Telephone Encounter (Signed)
      Patient called to have SL NTG refilled. I called this in to his pharmacy.  Perry Mount PA-C  [facility]

## 2013-09-20 ENCOUNTER — Encounter (HOSPITAL_BASED_OUTPATIENT_CLINIC_OR_DEPARTMENT_OTHER): Payer: Self-pay | Admitting: Emergency Medicine

## 2013-09-20 ENCOUNTER — Emergency Department (HOSPITAL_BASED_OUTPATIENT_CLINIC_OR_DEPARTMENT_OTHER)
Admission: EM | Admit: 2013-09-20 | Discharge: 2013-09-20 | Disposition: A | Discharge status: Home / Self Care | Payer: BC Managed Care – PPO | Attending: Emergency Medicine | Admitting: Emergency Medicine

## 2013-09-20 DIAGNOSIS — I10 Essential (primary) hypertension: Secondary | ICD-10-CM | POA: Insufficient documentation

## 2013-09-20 DIAGNOSIS — Z79899 Other long term (current) drug therapy: Secondary | ICD-10-CM | POA: Insufficient documentation

## 2013-09-20 DIAGNOSIS — F17200 Nicotine dependence, unspecified, uncomplicated: Secondary | ICD-10-CM | POA: Insufficient documentation

## 2013-09-20 DIAGNOSIS — Z7982 Long term (current) use of aspirin: Secondary | ICD-10-CM | POA: Insufficient documentation

## 2013-09-20 DIAGNOSIS — Z8659 Personal history of other mental and behavioral disorders: Secondary | ICD-10-CM | POA: Insufficient documentation

## 2013-09-20 DIAGNOSIS — Z951 Presence of aortocoronary bypass graft: Secondary | ICD-10-CM | POA: Insufficient documentation

## 2013-09-20 DIAGNOSIS — E785 Hyperlipidemia, unspecified: Secondary | ICD-10-CM | POA: Insufficient documentation

## 2013-09-20 DIAGNOSIS — I252 Old myocardial infarction: Secondary | ICD-10-CM | POA: Insufficient documentation

## 2013-09-20 DIAGNOSIS — I2510 Atherosclerotic heart disease of native coronary artery without angina pectoris: Secondary | ICD-10-CM | POA: Insufficient documentation

## 2013-09-20 DIAGNOSIS — A64 Unspecified sexually transmitted disease: Secondary | ICD-10-CM | POA: Insufficient documentation

## 2013-09-20 HISTORY — DX: Unspecified sexually transmitted disease: A64

## 2013-09-20 LAB — URINALYSIS, ROUTINE W REFLEX MICROSCOPIC
Bilirubin Urine: NEGATIVE
Glucose, UA: NEGATIVE mg/dL
HGB URINE DIPSTICK: NEGATIVE
KETONES UR: NEGATIVE mg/dL
Nitrite: NEGATIVE
PROTEIN: NEGATIVE mg/dL
Specific Gravity, Urine: 1.015 (ref 1.005–1.030)
Urobilinogen, UA: 0.2 mg/dL (ref 0.0–1.0)
pH: 5.5 (ref 5.0–8.0)

## 2013-09-20 LAB — URINE MICROSCOPIC-ADD ON

## 2013-09-20 MED ORDER — AZITHROMYCIN 250 MG PO TABS
1000.0000 mg | ORAL_TABLET | Freq: Once | ORAL | Status: AC
  Administered 2013-09-20: 1000 mg via ORAL
  Filled 2013-09-20: qty 4

## 2013-09-20 MED ORDER — CEFTRIAXONE SODIUM 250 MG IJ SOLR
250.0000 mg | Freq: Once | INTRAMUSCULAR | Status: AC
  Administered 2013-09-20: 250 mg via INTRAMUSCULAR
  Filled 2013-09-20: qty 250

## 2013-09-20 NOTE — Discharge Instructions (Signed)
Sexually Transmitted Disease A sexually transmitted disease (STD) is a disease or infection that may be passed (transmitted) from person to person, usually during sexual activity. This may happen by way of saliva, semen, blood, vaginal mucus, or urine. Common STDs include:   Gonorrhea.   Chlamydia.   Syphilis.   HIV and AIDS.   Genital herpes.   Hepatitis B and C.   Trichomonas.   Human papillomavirus (HPV).   Pubic lice.   Scabies.  Mites.  Bacterial vaginosis. WHAT ARE CAUSES OF STDs? An STD may be caused by bacteria, a virus, or parasites. STDs are often transmitted during sexual activity if one person is infected. However, they may also be transmitted through nonsexual means. STDs may be transmitted after:   Sexual intercourse with an infected person.   Sharing sex toys with an infected person.   Sharing needles with an infected person or using unclean piercing or tattoo needles.  Having intimate contact with the genitals, mouth, or rectal areas of an infected person.   Exposure to infected fluids during birth. WHAT ARE THE SIGNS AND SYMPTOMS OF STDs? Different STDs have different symptoms. Some people may not have any symptoms. If symptoms are present, they may include:   Painful or bloody urination.   Pain in the pelvis, abdomen, vagina, anus, throat, or eyes.   Skin rash, itching, irritation, growths, sores (lesions), ulcerations, or warts in the genital or anal area.  Abnormal vaginal discharge with or without bad odor.   Penile discharge in men.   Fever.   Pain or bleeding during sexual intercourse.   Swollen glands in the groin area.   Yellow skin and eyes (jaundice). This is seen with hepatitis.   Swollen testicles.  Infertility.  Sores and blisters in the mouth. HOW ARE STDs DIAGNOSED? To make a diagnosis, your health care provider may:   Take a medical history.   Perform a physical exam.   Take a sample of any  discharge for examination.  Swab the throat, cervix, opening to the penis, rectum, or vagina for testing.  Test a sample of your first morning urine.   Perform blood tests.   Perform a Pap smear, if this applies.   Perform a colposcopy.   Perform a laparoscopy.  HOW ARE STDs TREATED? Treatment depends on the STD. Some STDs may be treated but not cured.   Chlamydia, gonorrhea, trichomonas, and syphilis can be cured with antibiotics.   Genital herpes, hepatitis, and HIV can be treated, but not cured, with prescribed medicines. The medicines lessen symptoms.   Genital warts from HPV can be treated with medicine or by freezing, burning (electrocautery), or surgery. Warts may come back.   HPV cannot be cured with medicine or surgery. However, abnormal areas may be removed from the cervix, vagina, or vulva.   If your diagnosis is confirmed, your recent sexual partners need treatment. This is true even if they are symptom-free or have a negative culture or evaluation. They should not have sex until their health care providers say it is OK. HOW CAN I REDUCE MY RISK OF GETTING AN STD?  Use latex condoms, dental dams, and water-soluble lubricants during sexual activity. Do not use petroleum jelly or oils.  Get vaccinated for HPV and hepatitis. If you have not received these vaccines in the past, talk to your health care provider about whether one or both might be right for you.   Avoid risky sex practices that can break the skin.  WHAT SHOULD   I DO IF I THINK I HAVE AN STD?  See your health care provider.   Inform all sexual partners. They should be tested and treated for any STDs.  Do not have sex until your health care provider says it is OK. WHEN SHOULD I GET HELP? Seek immediate medical care if:  You develop severe abdominal pain.  You are a man and notice swelling or pain in the testicles.  You are a woman and notice swelling or pain in your vagina. Document  Released: 06/19/2002 Document Revised: 01/17/2013 Document Reviewed: 10/17/2012 [employer] Patient Information 2014 [employer].  

## 2013-09-20 NOTE — ED Notes (Signed)
Pt sts he has not taken BP med in 2 wks.

## 2013-09-20 NOTE — ED Provider Notes (Addendum)
CSN: [id number]     Arrival date & time 09/20/13  1033 History   First MD Initiated Contact with Roger Martinez 09/20/13 1103     Chief Complaint  Roger Martinez presents with  . Exposure to STD     (Consider location/radiation/quality/duration/timing/severity/associated sxs/prior Treatment) HPI Comments: Sexually active with one partner without protection  Roger Martinez is a 48 y.o. male presenting with STD exposure. The history is provided by the Roger Martinez.  Exposure to STD This is a new problem. The current episode started yesterday. The problem occurs constantly. The problem has been gradually worsening. Associated symptoms comments: Dysuria and penile drainage. Exacerbated by: urinating. Nothing relieves the symptoms. He has tried nothing for the symptoms. The treatment provided no relief.    Past Medical History  Diagnosis Date  . Coronary artery disease   . Hypertension   . Congestive heart failure   . Other and unspecified hyperlipidemia   . Anxiety state, unspecified   . MI (myocardial infarction) 03/2011  . STD (sexually transmitted disease)    Past Surgical History  Procedure Laterality Date  . Bypass graft  2011    3 vessel   Family History  Problem Relation Age of Onset  . Hypertension Mother   . Diabetes Mother    History  Substance Use Topics  . Smoking status: Current Every Day Smoker -- 0.50 packs/day for 15 years    Types: Cigarettes    Last Attempt to Quit: 04/05/2013  . Smokeless tobacco: Not on file  . Alcohol Use: No     Comment: ocassionaly    Review of Systems  All other systems reviewed and are negative.     Allergies  Review of Roger Martinez's allergies indicates no known allergies.  Home Medications   Prior to Admission medications   Medication Sig Start Date End Date Taking? Authorizing Provider  aspirin EC 325 MG tablet Take 325 mg by mouth daily.    Historical Provider, MD  carvedilol (COREG) 25 MG tablet Take 1 tablet (25 mg total) by mouth 2 (two)  times daily with a meal. 05/08/13   Candee Furbish, MD  famotidine (PEPCID) 20 MG tablet Take 1 tablet (20 mg total) by mouth 2 (two) times daily. 06/18/13   Lauren Burnetta Sabin, PA-C  furosemide (LASIX) 40 MG tablet Take 1 tablet (40 mg total) by mouth 2 (two) times daily. 05/08/13   Candee Furbish, MD  hydrALAZINE (APRESOLINE) 50 MG tablet Take 1 tablet (50 mg total) by mouth 3 (three) times daily. 05/25/13   Burtis Junes, NP  HYDROcodone-acetaminophen (NORCO/VICODIN) 5-325 MG per tablet Take 1 tablet by mouth at bedtime as needed. 06/18/13   Lauren Burnetta Sabin, PA-C  lisinopril (PRINIVIL,ZESTRIL) 40 MG tablet Take 0.5 tablets (20 mg total) by mouth daily. 05/08/13   Candee Furbish, MD  nitroGLYCERIN (NITROSTAT) 0.4 MG SL tablet Place 1 tablet (0.4 mg total) under the tongue every 5 (five) minutes as needed for chest pain. 05/25/13   Burtis Junes, NP  potassium chloride SA (KLOR-CON M20) 20 MEQ tablet Take 1 tablet (20 mEq total) by mouth 2 (two) times daily. 05/08/13   Candee Furbish, MD  simvastatin (ZOCOR) 20 MG tablet Take 2 tablets (40 mg total) by mouth daily. 05/08/13   Candee Furbish, MD   BP 194/102  Pulse 94  Temp(Src) 98.4 F (36.9 C) (Oral)  Resp 16  Ht 5\' 11"  (1.803 m)  Wt 203 lb (92.08 kg)  BMI 28.33 kg/m2  SpO2 97% Physical Exam  Nursing note  and vitals reviewed. Constitutional: He is oriented to person, place, and time. He appears well-developed and well-nourished. No distress.  HENT:  Head: Normocephalic and atraumatic.  Eyes: EOM are normal. Pupils are equal, round, and reactive to light.  Cardiovascular: Normal rate.   Pulmonary/Chest: Effort normal.  Abdominal: Soft. He exhibits no distension. There is no tenderness. There is no rebound and no guarding.  Genitourinary: Testes normal and penis normal. No penile erythema or penile tenderness. No discharge found.  Neurological: He is alert and oriented to person, place, and time.  Skin: Skin is warm and dry.  Psychiatric: He has a normal mood  and affect. His behavior is normal.    ED Course  Procedures (including critical care time) Labs Review Labs Reviewed  URINALYSIS, ROUTINE W REFLEX MICROSCOPIC - Abnormal; Notable for the following:    Leukocytes, UA MODERATE (*)    All other components within normal limits  URINE MICROSCOPIC-ADD ON - Abnormal; Notable for the following:    Bacteria, UA FEW (*)    All other components within normal limits    Imaging Review No results found.   EKG Interpretation None      MDM   Final diagnoses:  STD (male)   Roger Martinez comes in for an STD today. He yesterday he developed penile drainage and dysuria that's worsened today. He has one sexual partner about unprotected. Last known STD was approximately 30 years ago. Roger Martinez's urine was consistent with infection with leukocytes and white blood cells. He was brought for [facility] Chlamydia and treated with Rocephin and Zithromax.  Pt refused HIV testing.  Pt HTN here but stats has not taken some of his BP meds.  Told to continue to follow it and if worsens needs to see PCP.  Blanchie Dessert, MD 09/20/13 1119  Blanchie Dessert, MD 09/20/13 Singer, MD 09/20/13 1121

## 2013-09-20 NOTE — ED Notes (Signed)
Burning with urination and penile drainage that started today.

## 2013-09-21 LAB — URINE CULTURE
COLONY COUNT: NO GROWTH
CULTURE: NO GROWTH
SPECIAL REQUESTS: NORMAL

## 2013-09-21 LAB — GC/CHLAMYDIA PROBE AMP
CT Probe RNA: NEGATIVE
GC PROBE AMP APTIMA: NEGATIVE

## 2014-02-03 ENCOUNTER — Encounter (HOSPITAL_COMMUNITY): Payer: Self-pay | Admitting: Emergency Medicine

## 2014-02-03 ENCOUNTER — Emergency Department (HOSPITAL_COMMUNITY)
Admission: EM | Admit: 2014-02-03 | Discharge: 2014-02-03 | Disposition: A | Discharge status: Home / Self Care | Payer: BC Managed Care – PPO | Attending: Emergency Medicine | Admitting: Emergency Medicine

## 2014-02-03 DIAGNOSIS — M436 Torticollis: Secondary | ICD-10-CM | POA: Diagnosis not present

## 2014-02-03 DIAGNOSIS — Z8619 Personal history of other infectious and parasitic diseases: Secondary | ICD-10-CM | POA: Diagnosis not present

## 2014-02-03 DIAGNOSIS — E785 Hyperlipidemia, unspecified: Secondary | ICD-10-CM | POA: Insufficient documentation

## 2014-02-03 DIAGNOSIS — I10 Essential (primary) hypertension: Secondary | ICD-10-CM | POA: Insufficient documentation

## 2014-02-03 DIAGNOSIS — Z79899 Other long term (current) drug therapy: Secondary | ICD-10-CM | POA: Insufficient documentation

## 2014-02-03 DIAGNOSIS — I252 Old myocardial infarction: Secondary | ICD-10-CM | POA: Diagnosis not present

## 2014-02-03 DIAGNOSIS — I2510 Atherosclerotic heart disease of native coronary artery without angina pectoris: Secondary | ICD-10-CM | POA: Diagnosis not present

## 2014-02-03 DIAGNOSIS — M542 Cervicalgia: Secondary | ICD-10-CM | POA: Diagnosis present

## 2014-02-03 DIAGNOSIS — Z8659 Personal history of other mental and behavioral disorders: Secondary | ICD-10-CM | POA: Diagnosis not present

## 2014-02-03 DIAGNOSIS — Z7982 Long term (current) use of aspirin: Secondary | ICD-10-CM | POA: Diagnosis not present

## 2014-02-03 DIAGNOSIS — Z720 Tobacco use: Secondary | ICD-10-CM | POA: Diagnosis not present

## 2014-02-03 DIAGNOSIS — I509 Heart failure, unspecified: Secondary | ICD-10-CM | POA: Insufficient documentation

## 2014-02-03 MED ORDER — HYDROCODONE-ACETAMINOPHEN 5-325 MG PO TABS: ORAL_TABLET | ORAL | Status: DC

## 2014-02-03 MED ORDER — HYDROCODONE-ACETAMINOPHEN 5-325 MG PO TABS
1.0000 | ORAL_TABLET | Freq: Once | ORAL | Status: AC
  Administered 2014-02-03: 1 via ORAL
  Filled 2014-02-03: qty 1

## 2014-02-03 MED ORDER — METHOCARBAMOL 500 MG PO TABS: 1000.0000 mg | ORAL_TABLET | Freq: Four times a day (QID) | ORAL | Status: DC

## 2014-02-03 MED ORDER — METHOCARBAMOL 500 MG PO TABS
1000.0000 mg | ORAL_TABLET | Freq: Once | ORAL | Status: AC
  Administered 2014-02-03: 1000 mg via ORAL
  Filled 2014-02-03: qty 2

## 2014-02-03 NOTE — Discharge Instructions (Signed)
Please read and follow all provided instructions.  Your diagnoses today include:  1. Torticollis     Tests performed today include:  Vital signs - see below for your results today  Medications prescribed:   Vicodin (hydrocodone/acetaminophen) - narcotic pain medication  DO NOT drive or perform any activities that require you to be awake and alert because this medicine can make you drowsy. BE VERY CAREFUL not to take multiple medicines containing Tylenol (also called acetaminophen). Doing so can lead to an overdose which can damage your liver and cause liver failure and possibly death.   Robaxin (methocarbamol) - muscle relaxer medication  DO NOT drive or perform any activities that require you to be awake and alert because this medicine can make you drowsy.   Take any prescribed medications only as directed.  Home care instructions:   Follow any educational materials contained in this packet  Please rest, use ice or heat on your back for the next several days  Do not lift, push, pull anything more than 10 pounds for the next week  Follow-up instructions: Please follow-up with your primary care provider in the next 1 week for further evaluation of your symptoms.   Return instructions:  SEEK IMMEDIATE MEDICAL ATTENTION IF YOU HAVE:  New numbness, tingling, weakness, or problem with the use of your arms or legs  Severe back pain not relieved with medications  Loss control of your bowels or bladder  Increasing pain in any areas of the body (such as chest or abdominal pain)  Shortness of breath, dizziness, or fainting.   Worsening nausea (feeling sick to your stomach), vomiting, fever, or sweats  Any other emergent concerns regarding your health   Additional Information:  Your vital signs today were: BP 182/100   Pulse 86   Temp(Src) 98.3 F (36.8 C) (Oral)   Resp 16   SpO2 100% If your blood pressure (BP) was elevated above 135/85 this visit, please have this  repeated by your doctor within one month. --------------
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
## 2014-02-03 NOTE — ED Notes (Signed)
Declined W/C at D/C and was escorted to lobby by RN. 

## 2014-02-05 ENCOUNTER — Encounter (HOSPITAL_COMMUNITY): Payer: Self-pay | Admitting: Emergency Medicine

## 2014-02-05 ENCOUNTER — Emergency Department (HOSPITAL_COMMUNITY)
Admission: EM | Admit: 2014-02-05 | Discharge: 2014-02-05 | Disposition: A | Discharge status: Home / Self Care | Payer: BC Managed Care – PPO | Attending: Emergency Medicine | Admitting: Emergency Medicine

## 2014-02-05 DIAGNOSIS — E785 Hyperlipidemia, unspecified: Secondary | ICD-10-CM | POA: Insufficient documentation

## 2014-02-05 DIAGNOSIS — I10 Essential (primary) hypertension: Secondary | ICD-10-CM | POA: Diagnosis not present

## 2014-02-05 DIAGNOSIS — Z7982 Long term (current) use of aspirin: Secondary | ICD-10-CM | POA: Diagnosis not present

## 2014-02-05 DIAGNOSIS — M436 Torticollis: Secondary | ICD-10-CM | POA: Diagnosis not present

## 2014-02-05 DIAGNOSIS — I252 Old myocardial infarction: Secondary | ICD-10-CM | POA: Diagnosis not present

## 2014-02-05 DIAGNOSIS — M542 Cervicalgia: Secondary | ICD-10-CM

## 2014-02-05 DIAGNOSIS — I509 Heart failure, unspecified: Secondary | ICD-10-CM | POA: Diagnosis not present

## 2014-02-05 DIAGNOSIS — Z79899 Other long term (current) drug therapy: Secondary | ICD-10-CM | POA: Insufficient documentation

## 2014-02-05 DIAGNOSIS — Z8619 Personal history of other infectious and parasitic diseases: Secondary | ICD-10-CM | POA: Diagnosis not present

## 2014-02-05 DIAGNOSIS — Z720 Tobacco use: Secondary | ICD-10-CM | POA: Diagnosis not present

## 2014-02-05 DIAGNOSIS — I2510 Atherosclerotic heart disease of native coronary artery without angina pectoris: Secondary | ICD-10-CM | POA: Insufficient documentation

## 2014-02-05 DIAGNOSIS — F411 Generalized anxiety disorder: Secondary | ICD-10-CM | POA: Diagnosis not present

## 2014-02-05 MED ORDER — IBUPROFEN 400 MG PO TABS: 400.0000 mg | ORAL_TABLET | Freq: Three times a day (TID) | ORAL | Status: DC

## 2014-02-05 MED ORDER — KETOROLAC TROMETHAMINE 60 MG/2ML IM SOLN
60.0000 mg | Freq: Once | INTRAMUSCULAR | Status: AC
  Administered 2014-02-05: 60 mg via INTRAMUSCULAR
  Filled 2014-02-05: qty 2

## 2014-02-05 MED ORDER — DIAZEPAM 5 MG PO TABS: 5.0000 mg | ORAL_TABLET | Freq: Two times a day (BID) | ORAL | Status: DC | PRN

## 2014-02-05 MED ORDER — DIAZEPAM 5 MG PO TABS
5.0000 mg | ORAL_TABLET | Freq: Once | ORAL | Status: AC
  Administered 2014-02-05: 5 mg via ORAL
  Filled 2014-02-05: qty 1

## 2014-02-05 MED ORDER — OXYCODONE-ACETAMINOPHEN 5-325 MG PO TABS: 2.0000 | ORAL_TABLET | ORAL | Status: DC | PRN

## 2014-02-05 NOTE — ED Notes (Signed)
Pa is aware of manual bp and is comfortable with discharge home
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
## 2014-02-05 NOTE — ED Notes (Signed)
Pt states he has had pain in his neck, left shoulder, left upper back since arriving in [place] on Friday after a 12 hour bus trip from [place]. States he returned to [place] on Sunday and was seen and given medications. States he still is in pain (tearful), states he is unable to move or rest due to the pain. States he is taking the robaxin and vicodin but is not seeing any progress towards relief

## 2014-02-05 NOTE — ED Provider Notes (Signed)
Medical screening examination/treatment/procedure(s) were performed by non-physician practitioner and as supervising physician I was immediately available for consultation/collaboration.   EKG Interpretation None        Debby Freiberg, MD 02/05/14 0130

## 2014-02-05 NOTE — ED Provider Notes (Signed)
  Face-to-face evaluation   History: He complains of left neck pain for several days, after sleeping in an awkward position. Using the ED for evaluation, and prescribed Norco and Robaxin. These medications have not helped. He did not take his blood pressure medicine for several days because he has not been eating well.  Physical exam: Alert, calm, cooperative. Left neck tender laterally without deformity. He resists motion to the left and right secondary to pain in the left neck. He has normal strength in both hands, and normal sensation in both hands.  Medical screening examination/treatment/procedure(s) were conducted as a shared visit with non-physician practitioner(s) and myself.  I personally evaluated the patient during the encounter  Richarda Blade, MD 02/06/14 1131

## 2014-02-05 NOTE — Discharge Instructions (Signed)
Follow-up with orthopedics if your pain does not improve or should persist. Follow-up with your primary care physician. Return to the ER if he developed any numbness, tingling, weakness, high fever, nausea, vomiting.   Torticollis, Acute You have suddenly (acutely) developed a twisted neck (torticollis). This is usually a self-limited condition. CAUSES  Acute torticollis may be caused by malposition, trauma or infection. Most commonly, acute torticollis is caused by sleeping in an awkward position. Torticollis may also be caused by the flexion, extension or twisting of the neck muscles beyond their normal position. Sometimes, the exact cause may not be known. SYMPTOMS  Usually, there is pain and limited movement of the neck. Your neck may twist to one side. DIAGNOSIS  The diagnosis is often made by physical examination. X-rays, CT scans or MRIs may be done if there is a history of trauma or concern of infection. TREATMENT  For a common, stiff neck that develops during sleep, treatment is focused on relaxing the contracted neck muscle. Medications (including shots) may be used to treat the problem. Most cases resolve in several days. Torticollis usually responds to conservative physical therapy. If left untreated, the shortened and spastic neck muscle can cause deformities in the face and neck. Rarely, surgery is required. HOME CARE INSTRUCTIONS   Use over-the-counter and prescription medications as directed by your caregiver.  Do stretching exercises and massage the neck as directed by your caregiver.  Follow up with physical therapy if needed and as directed by your caregiver. SEEK IMMEDIATE MEDICAL CARE IF:   You develop difficulty breathing or noisy breathing (stridor).  You drool, develop trouble swallowing or have pain with swallowing.  You develop numbness or weakness in the hands or feet.  You have changes in speech or vision.  You have problems with urination or bowel  movements.  You have difficulty walking.  You have a fever.  You have increased pain. MAKE SURE YOU:   Understand these instructions.  Will watch your condition.  Will get help right away if you are not doing well or get worse. Document Released: 03/26/2000 Document Revised: 06/21/2011 Document Reviewed: 05/07/2009 [facility] Patient Information 2015 [place], [place]. This information is not intended to replace advice given to you by your health care provider. Make sure you discuss any questions you have with your health care provider.

## 2014-02-05 NOTE — ED Provider Notes (Signed)
CSN: [id number]     Arrival date & time 02/05/14  0857 History   First MD Initiated Contact with Patient 02/05/14 [phone]     No chief complaint on file.    (Consider location/radiation/quality/duration/timing/severity/associated sxs/prior Treatment) HPI [name] is a 48 year old male with past medical history of CAD, HTN, CHF who presents to the ER with neck pain. Patient reports he was on a 12 hour bus ride from [place] several days ago, and shortly after started experiencing a constant pain in his left lateral neck. Patient states he came to the ER 2 days ago and was evaluated, and reports that the medications he was prescribed for pain have not been working. He states his pain has persisted, and has not changed. He states pain is worse with movement, and alleviated slightly with holding his head still. Patient denies any blurred vision, headache, dizziness, arm pain, arm weakness, numbness, tingling. Patient denies any history of cancer or IV drug use. Patient denies nausea, vomiting, fever.  Past Medical History  Diagnosis Date  . Coronary artery disease   . Hypertension   . Congestive heart failure   . Other and unspecified hyperlipidemia   . Anxiety state, unspecified   . MI (myocardial infarction) 03/2011  . STD (sexually transmitted disease)    Past Surgical History  Procedure Laterality Date  . Bypass graft  2011    3 vessel   Family History  Problem Relation Age of Onset  . Hypertension Mother   . Diabetes Mother    History  Substance Use Topics  . Smoking status: Current Every Day Smoker -- 0.50 packs/day for 15 years    Types: Cigarettes    Last Attempt to Quit: 04/05/2013  . Smokeless tobacco: Not on file  . Alcohol Use: Yes     Comment: ocassionaly    Review of Systems  Constitutional: Negative for fever.  HENT: Negative for trouble swallowing.   Eyes: Negative for visual disturbance.  Respiratory: Negative for shortness of breath.   Cardiovascular:  Negative for chest pain.  Gastrointestinal: Negative for nausea, vomiting and abdominal pain.  Genitourinary: Negative for dysuria and difficulty urinating.  Musculoskeletal: Negative for neck pain.  Skin: Negative for rash.  Neurological: Negative for dizziness, weakness and numbness.       No arm/hand pain/weakness.    Psychiatric/Behavioral: Negative.       Allergies  Review of patient's allergies indicates no known allergies.  Home Medications   Prior to Admission medications   Medication Sig Start Date End Date Taking? Authorizing Provider  aspirin EC 325 MG tablet Take 650 mg by mouth daily.    Yes Historical Provider, MD  carvedilol (COREG) 25 MG tablet Take 1 tablet (25 mg total) by mouth 2 (two) times daily with a meal. 05/08/13  Yes Candee Furbish, MD  furosemide (LASIX) 40 MG tablet Take 1 tablet (40 mg total) by mouth 2 (two) times daily. 05/08/13  Yes Candee Furbish, MD  hydrALAZINE (APRESOLINE) 50 MG tablet Take 1 tablet (50 mg total) by mouth 3 (three) times daily. 05/25/13  Yes Burtis Junes, NP  lisinopril (PRINIVIL,ZESTRIL) 40 MG tablet Take 0.5 tablets (20 mg total) by mouth daily. 05/08/13  Yes Candee Furbish, MD  nitroGLYCERIN (NITROSTAT) 0.4 MG SL tablet Place 1 tablet (0.4 mg total) under the tongue every 5 (five) minutes as needed for chest pain. 05/25/13  Yes Burtis Junes, NP  potassium chloride SA (KLOR-CON M20) 20 MEQ tablet Take 1 tablet (20 mEq total)  by mouth 2 (two) times daily. 05/08/13  Yes Candee Furbish, MD  simvastatin (ZOCOR) 20 MG tablet Take 2 tablets (40 mg total) by mouth daily. 05/08/13  Yes Candee Furbish, MD  sulfamethoxazole-trimethoprim (BACTRIM DS,SEPTRA DS) 800-160 MG per tablet Take by mouth.   Yes Historical Provider, MD  diazepam (VALIUM) 5 MG tablet Take 1 tablet (5 mg total) by mouth every 12 (twelve) hours as needed for anxiety (spasms). 02/05/14   Carrie Mew, PA-C  famotidine (PEPCID) 20 MG tablet Take 1 tablet (20 mg total) by mouth 2 (two)  times daily. 06/18/13   Harvie Heck, PA-C  HYDROcodone-acetaminophen (NORCO/VICODIN) 5-325 MG per tablet Take 1-2 tablets every 6 hours as needed for severe pain 02/03/14   Carlisle Cater, PA-C  ibuprofen (ADVIL,MOTRIN) 400 MG tablet Take 1 tablet (400 mg total) by mouth 3 (three) times daily. 02/05/14   Carrie Mew, PA-C  methocarbamol (ROBAXIN) 500 MG tablet Take 2 tablets (1,000 mg total) by mouth 4 (four) times daily. 02/03/14   Carlisle Cater, PA-C  oxyCODONE-acetaminophen (PERCOCET) 5-325 MG per tablet Take 2 tablets by mouth every 4 (four) hours as needed. 02/05/14   Carrie Mew, PA-C   BP 182/116  Pulse 83  Temp(Src) 98.2 F (36.8 C) (Oral)  Resp 24  Ht 5\' 11"  (1.803 m)  Wt 194 lb (87.998 kg)  BMI 27.07 kg/m2  SpO2 100% Physical Exam  Nursing note and vitals reviewed. Constitutional: He is oriented to person, place, and time. He appears well-developed and well-nourished. No distress.  HENT:  Head: Normocephalic and atraumatic.  Mouth/Throat: Oropharynx is clear and moist. No oropharyngeal exudate.  Eyes: Right eye exhibits no discharge. Left eye exhibits no discharge. No scleral icterus.  Neck: Normal range of motion.  No midline or paraspinous C-spine tenderness. Patient has limited range of motion of neck due to pain.  Cardiovascular: Normal rate, regular rhythm and normal heart sounds.   No murmur heard. Pulmonary/Chest: Effort normal and breath sounds normal. No respiratory distress.  Abdominal: Soft. There is no tenderness.  Musculoskeletal: Normal range of motion. He exhibits no edema and no tenderness.       Back:  Neurological: He is alert and oriented to person, place, and time. He has normal strength. No cranial nerve deficit or sensory deficit. He displays a negative Romberg sign. Coordination and gait normal. GCS eye subscore is 4. GCS verbal subscore is 5. GCS motor subscore is 6.  Patient fully alert answering questions appropriately in full, clear sentences.  Motor strength 5 out of 5 in all major muscle groups of upper and lower extremities. Distal sensation intact. Grip strength 5 out of 5.  Skin: Skin is warm and dry. No rash noted. He is not diaphoretic.  Psychiatric: He has a normal mood and affect.    ED Course  Procedures (including critical care time) Labs Review Labs Reviewed - No data to display  Imaging Review No results found.   EKG Interpretation None      MDM   Final diagnoses:  Neck pain on left side  Torticollis   Patient hypertensive in the ED, pressure down to manual 180/118 after pain medication. Patient stating he has been noncompliant over the past several days, and has been in pain since last night, he feels his blood pressure is elevated because of his pain. With patient having no signs or symptoms of hypertension urgency or emergency, with patient denying headache, blurred vision, dizziness, weakness, chest pain, shortness of breath, dysuria,  abdominal pain, nausea, vomiting, I do not believe this is a hypertensive emergency or urgency. I believe patient will have to follow up with his primary care physician and continue taking his prescribed blood pressure medication. Patient's pain in his neck has improved after Toradol and Valium. Pt's ROM in his neck improved. Imaging deferred due to pt's pain being located in his trapezius muscle and having no midline spinous process tenderness or shoulder tenderness.  No neurological deficits and normal neuro exam.  No fever, night sweats, weight loss, h/o cancer, IVDU.  RICE protocol and pain medicine indicated and discussed with patient. We will plan on discharging patient this time, and have him follow up with primary care physician. I discussed return precautions with pt and encouraged him to call or return to the ER should he have any questions or concerns.    BP 182/116  Pulse 83  Temp(Src) 98.2 F (36.8 C) (Oral)  Resp 24  Ht 5\' 11"  (1.803 m)  Wt 194 lb (87.998 kg)   BMI 27.07 kg/m2  SpO2 100%  Signed,  Dahlia Bailiff, PA-C 11:29 AM  This patient seen and discussed with Dr. Christ Kick, MD     Carrie Mew, PA-C 02/06/14 [id number]

## 2014-02-05 NOTE — ED Notes (Signed)
Addressed pt blood pressure with pa joe

## 2014-02-07 ENCOUNTER — Emergency Department (HOSPITAL_COMMUNITY)
Admission: EM | Admit: 2014-02-07 | Discharge: 2014-02-07 | Disposition: A | Discharge status: Home / Self Care | Payer: BC Managed Care – PPO | Attending: Emergency Medicine | Admitting: Emergency Medicine

## 2014-02-07 ENCOUNTER — Encounter (HOSPITAL_COMMUNITY): Payer: Self-pay | Admitting: Emergency Medicine

## 2014-02-07 DIAGNOSIS — I10 Essential (primary) hypertension: Secondary | ICD-10-CM | POA: Diagnosis not present

## 2014-02-07 DIAGNOSIS — M542 Cervicalgia: Secondary | ICD-10-CM | POA: Diagnosis not present

## 2014-02-07 DIAGNOSIS — F411 Generalized anxiety disorder: Secondary | ICD-10-CM | POA: Insufficient documentation

## 2014-02-07 DIAGNOSIS — I509 Heart failure, unspecified: Secondary | ICD-10-CM | POA: Insufficient documentation

## 2014-02-07 DIAGNOSIS — K088 Other specified disorders of teeth and supporting structures: Secondary | ICD-10-CM | POA: Insufficient documentation

## 2014-02-07 DIAGNOSIS — Z8619 Personal history of other infectious and parasitic diseases: Secondary | ICD-10-CM | POA: Diagnosis not present

## 2014-02-07 DIAGNOSIS — Z951 Presence of aortocoronary bypass graft: Secondary | ICD-10-CM | POA: Diagnosis not present

## 2014-02-07 DIAGNOSIS — I252 Old myocardial infarction: Secondary | ICD-10-CM | POA: Insufficient documentation

## 2014-02-07 DIAGNOSIS — Z79899 Other long term (current) drug therapy: Secondary | ICD-10-CM | POA: Diagnosis not present

## 2014-02-07 DIAGNOSIS — I2510 Atherosclerotic heart disease of native coronary artery without angina pectoris: Secondary | ICD-10-CM | POA: Insufficient documentation

## 2014-02-07 DIAGNOSIS — Z720 Tobacco use: Secondary | ICD-10-CM | POA: Diagnosis not present

## 2014-02-07 DIAGNOSIS — K0889 Other specified disorders of teeth and supporting structures: Secondary | ICD-10-CM

## 2014-02-07 DIAGNOSIS — Z8639 Personal history of other endocrine, nutritional and metabolic disease: Secondary | ICD-10-CM | POA: Diagnosis not present

## 2014-02-07 MED ORDER — MORPHINE SULFATE 4 MG/ML IJ SOLN
8.0000 mg | Freq: Once | INTRAMUSCULAR | Status: AC
  Administered 2014-02-07: 8 mg via INTRAMUSCULAR
  Filled 2014-02-07: qty 2

## 2014-02-07 NOTE — ED Provider Notes (Signed)
CSN: [id number]     Arrival date & time 02/07/14  0253 History   First MD Initiated Contact with Patient 02/07/14 [phone]     Chief Complaint  Patient presents with  . Neck Pain  . Dental Pain  . Hypertension     (Consider location/radiation/quality/duration/timing/severity/associated sxs/prior Treatment) HPI  Roger Martinez is a 48 y.o. male with past medical history of coronary artery disease, hypertension, CHF coming in with dental pain. Patient states his tooth pain began yesterday. He has seen his dentist who placed him on antibiotics and he will have the tooth pulled next Tuesday.  He has been taking Motrin and Norco at home for pain without any relief. Patient states he cannot sleep at night. He denies any fevers or recent infections. Patient is requesting pain control. He denies chest pain or shortness of breath. He has no further complaints.  10 Systems reviewed and are negative for acute change except as noted in the HPI.       Past Medical History  Diagnosis Date  . Coronary artery disease   . Hypertension   . Congestive heart failure   . Other and unspecified hyperlipidemia   . Anxiety state, unspecified   . MI (myocardial infarction) 03/2011  . STD (sexually transmitted disease)    Past Surgical History  Procedure Laterality Date  . Bypass graft  2011    3 vessel   Family History  Problem Relation Age of Onset  . Hypertension Mother   . Diabetes Mother    History  Substance Use Topics  . Smoking status: Current Every Day Smoker -- 0.50 packs/day for 15 years    Types: Cigarettes    Last Attempt to Quit: 04/05/2013  . Smokeless tobacco: Not on file  . Alcohol Use: Yes     Comment: ocassionaly    Review of Systems    Allergies  Review of patient's allergies indicates no known allergies.  Home Medications   Prior to Admission medications   Medication Sig Start Date End Date Taking? Authorizing Provider  aspirin EC 325 MG tablet Take 650 mg by  mouth daily.     Historical Provider, MD  carvedilol (COREG) 25 MG tablet Take 1 tablet (25 mg total) by mouth 2 (two) times daily with a meal. 05/08/13   Candee Furbish, MD  diazepam (VALIUM) 5 MG tablet Take 1 tablet (5 mg total) by mouth every 12 (twelve) hours as needed for anxiety (spasms). 02/05/14   Carrie Mew, PA-C  famotidine (PEPCID) 20 MG tablet Take 1 tablet (20 mg total) by mouth 2 (two) times daily. 06/18/13   Harvie Heck, PA-C  furosemide (LASIX) 40 MG tablet Take 1 tablet (40 mg total) by mouth 2 (two) times daily. 05/08/13   Candee Furbish, MD  hydrALAZINE (APRESOLINE) 50 MG tablet Take 1 tablet (50 mg total) by mouth 3 (three) times daily. 05/25/13   Burtis Junes, NP  HYDROcodone-acetaminophen (NORCO/VICODIN) 5-325 MG per tablet Take 1-2 tablets every 6 hours as needed for severe pain 02/03/14   Carlisle Cater, PA-C  ibuprofen (ADVIL,MOTRIN) 400 MG tablet Take 1 tablet (400 mg total) by mouth 3 (three) times daily. 02/05/14   Carrie Mew, PA-C  lisinopril (PRINIVIL,ZESTRIL) 40 MG tablet Take 0.5 tablets (20 mg total) by mouth daily. 05/08/13   Candee Furbish, MD  methocarbamol (ROBAXIN) 500 MG tablet Take 2 tablets (1,000 mg total) by mouth 4 (four) times daily. 02/03/14   Carlisle Cater, PA-C  nitroGLYCERIN (NITROSTAT) 0.4 MG  SL tablet Place 1 tablet (0.4 mg total) under the tongue every 5 (five) minutes as needed for chest pain. 05/25/13   Burtis Junes, NP  oxyCODONE-acetaminophen (PERCOCET) 5-325 MG per tablet Take 2 tablets by mouth every 4 (four) hours as needed. 02/05/14   Carrie Mew, PA-C  potassium chloride SA (KLOR-CON M20) 20 MEQ tablet Take 1 tablet (20 mEq total) by mouth 2 (two) times daily. 05/08/13   Candee Furbish, MD  simvastatin (ZOCOR) 20 MG tablet Take 2 tablets (40 mg total) by mouth daily. 05/08/13   Candee Furbish, MD  sulfamethoxazole-trimethoprim (BACTRIM DS,SEPTRA DS) 800-160 MG per tablet Take by mouth.    Historical Provider, MD   BP 208/116  Pulse 93   Temp(Src) 99.4 F (37.4 C) (Oral)  Resp 17  Ht 5\' 11"  (1.803 m)  Wt 196 lb (88.905 kg)  BMI 27.35 kg/m2  SpO2 98% Physical Exam  Nursing note and vitals reviewed. Constitutional: He is oriented to person, place, and time. Vital signs are normal. He appears well-developed and well-nourished.  Non-toxic appearance. He does not appear ill. No distress.  HENT:  Head: Normocephalic and atraumatic.  Nose: Nose normal.  Mouth/Throat: Oropharynx is clear and moist. No oropharyngeal exudate.  Overall poor dentition. Tooth 31 is chipped with mild tenderness to palpation. No significant swelling seen or abscess formation.  Eyes: Conjunctivae and EOM are normal. Pupils are equal, round, and reactive to light. No scleral icterus.  Neck: No tracheal deviation, no edema, no erythema and normal range of motion present. No mass and no thyromegaly present.  Cardiovascular: Normal rate, regular rhythm, S1 normal, S2 normal, normal heart sounds, intact distal pulses and normal pulses.  Exam reveals no gallop and no friction rub.   No murmur heard. Pulses:      Radial pulses are 2+ on the right side, and 2+ on the left side.       Dorsalis pedis pulses are 2+ on the right side, and 2+ on the left side.  Pulmonary/Chest: Effort normal and breath sounds normal. No respiratory distress. He has no wheezes. He has no rhonchi. He has no rales.  Abdominal: Soft. Normal appearance and bowel sounds are normal. He exhibits no distension, no ascites and no mass. There is no hepatosplenomegaly. There is no tenderness. There is no rebound, no guarding and no CVA tenderness.  Musculoskeletal: Normal range of motion. He exhibits no edema and no tenderness.  Left upper extremity in sling.  Lymphadenopathy:    He has no cervical adenopathy.  Neurological: He is alert and oriented to person, place, and time. He has normal strength. No sensory deficit. GCS eye subscore is 4. GCS verbal subscore is 5. GCS motor subscore is 6.   Skin: Skin is warm, dry and intact. No petechiae and no rash noted. He is not diaphoretic. No erythema. No pallor.  Psychiatric: He has a normal mood and affect. His behavior is normal. Judgment normal.    ED Course  Procedures (including critical care time) Labs Review Labs Reviewed - No data to display  Imaging Review No results found.   EKG Interpretation None      MDM   Final diagnoses:  None    Patient presents emergency department for dental pain. He will be given IM morphine for pain control. He was advised that Norco is the strongest prescription he can be sent home with. He'll be given dental follow-up here to see if he can get an earlier appointment. Patient does  have severe hypertension, however he is asymptomatic at this time denying any confusion, chest pain, shortness of breath. He was advised to take his medications and follow-up with his primary care physician within 3 days. Patient is safe for discharge.    Everlene Balls, MD 02/07/14 [phone]

## 2014-02-07 NOTE — ED Notes (Signed)
Pt. reports persistent left neck pain radiating to left shoulder onset Friday , seen here yesterday prescribed with percocet, Valium and Ibuprofen , hypertensive at triage , pt. also reported right lower molar pain onset this evening .

## 2014-02-07 NOTE — Discharge Instructions (Signed)
Dental Pain Roger Martinez, you were seen today for dental pain. Continue to take Motrin and Norco at home as needed. Take antibiotics as prescribed and follow-up with her dentist for ultimate treatment. If any symptoms worsen, come back to the ED for repeat evaluation.  Thank you. Toothache is pain in or around a tooth. It may get worse with chewing or with cold or heat.  HOME CARE  Your dentist may use a numbing medicine during treatment. If so, you may need to avoid eating until the medicine wears off. Ask your dentist about this.  Only take medicine as told by your dentist or doctor.  Avoid chewing food near the painful tooth until after all treatment is done. Ask your dentist about this. GET HELP RIGHT AWAY IF:   The problem gets worse or new problems appear.  You have a fever.  There is redness and puffiness (swelling) of the face, jaw, or neck.  You cannot open your mouth.  There is pain in the jaw.  There is very bad pain that is not helped by medicine. MAKE SURE YOU:   Understand these instructions.  Will watch your condition.  Will get help right away if you are not doing well or get worse. Document Released: 09/15/2007 Document Revised: 06/21/2011 Document Reviewed: 09/15/2007 [facility] Patient Information 2015 [place], [place]. This information is not intended to replace advice given to you by your health care provider. Make sure you discuss any questions you have with your health care provider. Hypertension Hypertension is another name for high blood pressure. High blood pressure forces your heart to work harder to pump blood. A blood pressure reading has two numbers, which includes a higher number over a lower number (example: 110/72). HOME CARE   Have your blood pressure rechecked by your doctor.  Only take medicine as told by your doctor. Follow the directions carefully. The medicine does not work as well if you skip doses. Skipping doses also puts you at risk for  problems.  Do not smoke.  Monitor your blood pressure at home as told by your doctor. GET HELP IF:  You think you are having a reaction to the medicine you are taking.  You have repeat headaches or feel dizzy.  You have puffiness (swelling) in your ankles.  You have trouble with your vision. GET HELP RIGHT AWAY IF:   You get a very bad headache and are confused.  You feel weak, numb, or faint.  You get chest or belly (abdominal) pain.  You throw up (vomit).  You cannot breathe very well. MAKE SURE YOU:   Understand these instructions.  Will watch your condition.  Will get help right away if you are not doing well or get worse. Document Released: 09/15/2007 Document Revised: 04/03/2013 Document Reviewed: 01/19/2013 [facility] Patient Information 2015 [place], [place]. This information is not intended to replace advice given to you by your health care provider. Make sure you discuss any questions you have with your health care provider.  Emergency Department Resource Guide 1) Find a Doctor and Pay Out of Pocket Although you won't have to find out who is covered by your insurance plan, it is a good idea to ask around and get recommendations. You will then need to call the office and see if the doctor you have chosen will accept you as a new patient and what types of options they offer for patients who are self-pay. Some doctors offer discounts or will set up payment plans for their patients  who do not have insurance, but you will need to ask so you aren't surprised when you get to your appointment.  2) Contact Your Local Health Department Not all health departments have doctors that can see patients for sick visits, but many do, so it is worth a call to see if yours does. If you don't know where your local health department is, you can check in your phone book. The CDC also has a tool to help you locate your state's health department, and many state websites also have listings of  all of their local health departments.  3) Find a [facility] If your illness is not likely to be very severe or complicated, you may want to try a walk in clinic. These are popping up all over the country in pharmacies, drugstores, and shopping centers. They're usually staffed by nurse practitioners or physician assistants that have been trained to treat common illnesses and complaints. They're usually fairly quick and inexpensive. However, if you have serious medical issues or chronic medical problems, these are probably not your best option.  No Primary Care Doctor: - Call Health Connect at  [phone] - they can help you locate a primary care doctor that  accepts your insurance, provides certain services, etc. - Physician Referral Service- [phone]  Chronic Pain Problems: Organization         Address  Phone   Notes  [facility]  [phone] Patients need to be referred by their primary care doctor.   Medication Assistance: Organization         Address  Phone   Notes  [facility] Medication [facility] [address] [place], [place] [address] [phone] --Must be a resident of [facility] -- Must have NO insurance coverage whatsoever (no Medicaid/ Medicare, etc.) -- The pt. MUST have a primary care doctor that directs their care regularly and follows them in the community   MedAssist  [phone]   [employer]  [phone]    Agencies that provide inexpensive medical care: Organization         Address  Phone   Notes  [place]  [phone]   [facility]    [phone]   [facility] [place], [place] [address] [phone]   [place] [address], [place] [phone]   Planned Parenthood    [phone]   [facility]    [phone]   [place] and [place],  [place] Phone:  [phone], Fax:  [phone] Hours of Operation:  9 am - 6 pm, M-F.  Also accepts Medicaid/Medicare and self-pay.  [facility] for [place] [address], [address], [place] Phone: [phone], Fax: [phone]. Hours of Operation:  8:30 am - 5:30 pm, M-F.  Also accepts Medicaid and self-pay.  [facility] [place] [address] [place] Phone: [phone]   [place], [place], [place] [phone], Ext. [phone] Mondays & Thursdays: 7-9 AM.  First 15 patients are seen on a first come, first serve basis.    [place] Providers:  Organization         Address  Phone   Notes  [facility] [address], [place] [phone] Also accepts self-pay patients.  [facility] [place], [place]  [phone]   [place], [address], [place] [phone]   [facility] [address] [place] [phone]   Lucianne Lei [address], [place]   [phone] Only accepts [place] Access [place] patients after they have their name applied to their card.   Self-Pay (no insurance) in [facility]:  Organization         Address  Phone   Notes  Sickle Cell Patients, [facility] Internal [facility] [place] [phone]   [facility] Urgent Care [place] [phone]   [facility] [place]  [place], [place], [place] [phone]   [facility]/Dr. Osei-Bonsu  [address], [place] or [address], [place] [phone] Phone number for both [place] and [place] locations is the same.  [facility]gent [facility] [address], [place] [phone]   [facility] [address], [place] or [address] [phone]   [facility] [address] [place] [phone], phone; [phone], fax Sees patients 1st and 3rd Saturday of every month.  Must not qualify for public or private insurance (i.e. Medicaid, Medicare, [place] Health Choice, Veterans' Benefits)  Household income should be no more than 200% of the poverty level The clinic cannot treat you if you are pregnant or think you are pregnant  Sexually transmitted diseases are not treated at the clinic.    Dental Care: Organization         Address  Phone  Notes  [facility] Department of [facility] [place] [phone] Accepts children up to age 1 who are enrolled in [place] or [place]; pregnant women with a Medicaid card; and children who have applied for Medicaid or [place] Health Choice, but were declined, whose parents can pay a reduced fee at time of service.  [facility] Department of [facility]  [address], [place] [phone] Accepts children up to age 55 who are enrolled in [place] or [place]; pregnant women with a Medicaid card; and children who have applied for Medicaid or [place] Health Choice, but were declined, whose parents can pay a reduced fee at time of service.  [place] Adult Dental Access PROGRAM  [place] [phone] Patients are seen by appointment only. Walk-ins are not accepted. [place] will see patients 30 years of age and older. Monday - Tuesday (8am-5pm) Most Wednesdays (8:30-5pm) $30 per visit, cash only  [facility] Adult Dental Access PROGRAM  [address], [facility] [phone] Patients are seen by appointment only. Walk-ins are not accepted. [place] will see patients 76 years of age and older. One Wednesday Evening (Monthly: Volunteer Based).  $30 per visit, cash only  [place]   [phone] for adults; Children under age 70, call Graduate Pediatric Dentistry at [phone]. Children aged 50-14, please call [phone] to request a pediatric application.  Dental services are provided in all areas of dental care including fillings, crowns and bridges, complete and partial dentures, implants, gum treatment, root canals, and extractions. Preventive care is also provided. Treatment is provided to both adults and children.  Patients are selected via a lottery and there is often a waiting list.   [facility] [address], [place]  [phone] [email or web address]   [facility] [address] [place], [place] [phone], Ext. [phone] Second and Fourth Thursday of each month, opens at 6:30 AM; Clinic ends at 9 AM.  Patients are seen on a first-come first-served basis, and a limited number are seen during each clinic.   [facility]  [address] Hillard Danker [place], [place] [phone]   Eligibility Requirements You must have lived in [place], [place], or [place] counties for at least the last three months.   You cannot be eligible for state or federal sponsored [employer], including [employer], [place], or [employer].   You generally cannot be eligible for healthcare insurance through your employer.    How to apply: Eligibility screenings are held every Tuesday and Wednesday afternoon from 1:00 pm until 4:00 pm. You do not need an appointment for the interview!  [facility] [address], [place], [place]   [place] Department  [place]  [phone]    Behavioral Health Resources in the Community: Intensive Outpatient Programs Organization         Address  Phone  Notes  [place]. [address] [place], [place] [phone]   [facility] Outpatient [address] [place], [place]   ADS: Alcohol & Drug Svcs [address], [place], [place]   [place] [address]  [place], [place] or [phone]   Substance Abuse Resources Organization         Address  Phone  Notes  Alcohol and Drug Services  [phone]   [place]  [phone]   [facility] [place]   Chinita Pester  [phone]   Residential & Outpatient Substance Abuse Program  [phone]   Psychological Services Organization         Address  Phone  Notes  [facility] [place]  [place]  [phone]   [place] [address] [place] or [phone]    Mobile Crisis Teams Organization         Address  Phone  Notes  [facility]atives, Mobile Crisis Care Unit  [phone]   Assertive Psychotherapeutic Services  [address] [place], [place]   [place] [address] [place] [phone]    Self-Help/Support Groups Organization         Address  Phone             Notes  [place] [facility] [place] - variety of support groups  [place] Call for more information  Narcotics Anonymous (NA), Caring Services [address], [employer] [place]  2 meetings at this location   Special educational needs teacher         Address  Phone  Notes  ASAP Residential Treatment [place],    [place]  [phone]   [facility]  [address], [place] [phone], [place], [place]   [place], [place] [phone] Admissions: 8am-3pm M-F  Incentives Substance [place] [address]    [place], [place]   [facility] [place]  [address] Jacinto Reap [place], [place]   [facility] [address],  [place], [place]     [facility]ight Programs - Intensive Outpatient [address] Kristeen [address], [place], [place]   [facility] ([place].,  [place], [place] [phone] or [phone]   Residential Treatment Services (RTS) [address] [place], [place] Accepts Medicaid  [facility] [place] [address]  [place] [phone] Substance Abuse/Addiction Treatment   [facility] Organization         Address  Phone  Notes  [facility]  [phone]   Domenic Schwab, PhD [address] Arlis [place], [place]   [phone] or [phone]   [place], [place] [phone]   [place] [address], [place], [place] [phone] Insurance/Medicaid/sponsorship through [facility] and Families [address] [place]                                    [place], [place] [phone] [place] [address] [place], [place] [phone]    Dr. Adele Schilder  [phone]   [facility] Dept. 1) [address], [place] 2) [place] 3)  [place] [address], [place] [phone]  [phone]   [place] [phone] or [phone] (After Hours)

## 2014-03-14 ENCOUNTER — Telehealth: Payer: Self-pay | Admitting: Cardiology

## 2014-03-14 ENCOUNTER — Ambulatory Visit (INDEPENDENT_AMBULATORY_CARE_PROVIDER_SITE_OTHER): Payer: Self-pay | Admitting: Family Medicine

## 2014-03-14 VITALS — BP 136/86 | HR 58 | Temp 98.1°F | Resp 16 | Ht 70.0 in | Wt 196.2 lb

## 2014-03-14 DIAGNOSIS — I10 Essential (primary) hypertension: Secondary | ICD-10-CM

## 2014-03-14 DIAGNOSIS — Z0289 Encounter for other administrative examinations: Secondary | ICD-10-CM

## 2014-03-14 DIAGNOSIS — I5022 Chronic systolic (congestive) heart failure: Secondary | ICD-10-CM

## 2014-03-14 DIAGNOSIS — I2510 Atherosclerotic heart disease of native coronary artery without angina pectoris: Secondary | ICD-10-CM

## 2014-03-14 DIAGNOSIS — Z008 Encounter for other general examination: Secondary | ICD-10-CM

## 2014-03-14 NOTE — Telephone Encounter (Signed)
Walk In pt Form " Question About DOT Certification Form" please call gave to Ivy/KM

## 2014-03-18 ENCOUNTER — Telehealth: Payer: Self-pay

## 2014-03-18 NOTE — Telephone Encounter (Signed)
That will be ordered by the cardiologist.  He does need an echocardiogram to receive a new DOT card but I cannot order this as I have only seen pt for his DOT exam.  This was given to him on his written partial DOT form - says exactly what he needs in order for him to receive his DOT card so he can show that to his cardiologist.

## 2014-03-18 NOTE — Telephone Encounter (Signed)
Pt has an appt with dr Daymon Larsen on 03/20/14 and was told by dr Brigitte Pulse that an echo cardiogram would be included in the office visit -can someone put in an order for the echo to be on 03/20/14 with dr skein and call the patient when this is complete at [phone]

## 2014-03-18 NOTE — Telephone Encounter (Signed)
Dr. Brigitte Pulse,  What kind of echocardiogram would you like ordered?  Thanks, [employer]

## 2014-03-19 NOTE — Telephone Encounter (Signed)
Pt advised. Echo has not been ordered. Pt understands.

## 2014-03-20 ENCOUNTER — Encounter: Payer: Self-pay | Admitting: Cardiology

## 2014-03-20 ENCOUNTER — Ambulatory Visit (INDEPENDENT_AMBULATORY_CARE_PROVIDER_SITE_OTHER): Payer: BC Managed Care – PPO | Admitting: Cardiology

## 2014-03-20 VITALS — BP 130/90 | HR 56 | Ht 71.0 in | Wt 197.0 lb

## 2014-03-20 DIAGNOSIS — I10 Essential (primary) hypertension: Secondary | ICD-10-CM

## 2014-03-20 DIAGNOSIS — I429 Cardiomyopathy, unspecified: Secondary | ICD-10-CM

## 2014-03-20 DIAGNOSIS — I2510 Atherosclerotic heart disease of native coronary artery without angina pectoris: Secondary | ICD-10-CM

## 2014-03-20 LAB — BASIC METABOLIC PANEL
BUN: 15 mg/dL (ref 6–23)
CHLORIDE: 101 meq/L (ref 96–112)
CO2: 28 meq/L (ref 19–32)
CREATININE: 1.2 mg/dL (ref 0.4–1.5)
Calcium: 9.2 mg/dL (ref 8.4–10.5)
GFR: 86.93 mL/min (ref >60.00)
GLUCOSE: 87 mg/dL (ref 70–99)
Potassium: 4 mEq/L (ref 3.5–5.1)
Sodium: 137 mEq/L (ref 135–145)

## 2014-03-20 NOTE — Progress Notes (Signed)
[place]. [address] [place], [place]  [address] Phone: [phone] Fax:  [phone]  Date:  03/20/2014   ID:  Roger Martinez, DOB 1966-03-09, MRN [id number]  PCP:  No PCP Per Patient   History of Present Illness: Roger Martinez is a 48 y.o. male with prior history of three-vessel bypass in 2011, coronary artery disease, hypertension here for follow-up. Patient previously left AMA from hospital on 04/07/13 with new diagnosis of systolic heart failure with ejection fraction of 30-35%.   He is a Administrator. He states that he has been taking his medications now. He had to stop a couple times on the road secondary to mouth pain. No angina, no syncopal, no palpitations.   Wt Readings from Last 3 Encounters:  03/20/14 197 lb (89.359 kg)  03/14/14 196 lb 3.2 oz (88.996 kg)  02/07/14 196 lb (88.905 kg)     Past Medical History  Diagnosis Date  . Coronary artery disease   . Hypertension   . Congestive heart failure   . Other and unspecified hyperlipidemia   . Anxiety state, unspecified   . MI (myocardial infarction) 03/2011  . STD (sexually transmitted disease)     Past Surgical History  Procedure Laterality Date  . Bypass graft  2011    3 vessel    Current Outpatient Prescriptions  Medication Sig Dispense Refill  . aspirin EC 325 MG tablet Take 650 mg by mouth daily.     . carvedilol (COREG) 25 MG tablet Take 1 tablet (25 mg total) by mouth 2 (two) times daily with a meal. 60 tablet 3  . furosemide (LASIX) 40 MG tablet Take 1 tablet (40 mg total) by mouth 2 (two) times daily. 60 tablet 3  . hydrALAZINE (APRESOLINE) 50 MG tablet Take 1 tablet (50 mg total) by mouth 3 (three) times daily. 90 tablet 3  . lisinopril (PRINIVIL,ZESTRIL) 40 MG tablet Take 0.5 tablets (20 mg total) by mouth daily. 30 tablet 3  . potassium chloride SA (KLOR-CON M20) 20 MEQ tablet Take 1 tablet (20 mEq total) by mouth 2 (two) times daily. 60 tablet 3  . simvastatin (ZOCOR) 20 MG tablet  Take 2 tablets (40 mg total) by mouth daily. 60 tablet 3   No current facility-administered medications for this visit.    Allergies:   No Known Allergies  Social History:  The patient  reports that he has been smoking Cigarettes.  He has a 7.5 pack-year smoking history. He does not have any smokeless tobacco history on file. 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 Hypertension Mother   . Diabetes Mother     ROS:  Please see the history of present illness.   Denies any syncope, bleeding, positive orthopnea, positive PND, no fevers, chills, rash   All other systems reviewed and negative.   PHYSICAL EXAM: VS:  BP 130/90 mmHg  Pulse 56  Ht 5\' 11"  (1.803 m)  Wt 197 lb (89.359 kg)  BMI 27.49 kg/m2 Well nourished, well developed, in no acute distress HEENT: normal, [place]/AT, EOMI Neck: Need neckJVD, normal carotid upstroke, no bruit Cardiac:  normal S1, S2; positive S3, palpable apex, RRR; no murmur Lungs:  clear to auscultation bilaterally, no wheezing, rhonchi or ralesI do not appreciate any crackles on exam. Abd: soft, nontender, no hepatomegaly, no bruits Ext: no edema, 2+ distal pulses Skin: warm and dry  GU: deferred Neuro: no focal abnormalities noted, AAO x 3  EKG:  03/20/14-sinus bradycardia rate 56, T-wave inversion in precordial/inferior leads-no change from prior 05/08/13 :Sinus rhythm rate 80 with T-wave inversion through precordial leads as well as inferior leads, possible ischemia. No significant change from prior.  Lab work, hospital records extensively reviewed. 04/06/13-creatinine 1.21, troponin normal, BNP 4500, LDL 118, hemoglobin 15.3  Echocardiogram:   04/06/13  - Left ventricle: The cavity size was normal. Wall thickness was increased in a pattern of severe LVH. Systolic function was moderately to severely reduced. The estimated ejection fraction was in the range of 30% to 35%.  Diffuse hypokinesis. - Aortic valve: Mild regurgitation. - Mitral valve: Mild regurgitation. - Left atrium: The atrium was moderately dilated. - Right atrium: The atrium was mildly dilated. - Pulmonary arteries: PA peak pressure: 22mm Hg (S).   ASSESSMENT AND PLAN:  1. Cardiomyopathy/chronic systolic heart failure-EF 30-35% previously. Repeating echocardiogram. Hopeful improvement. Admitted December 2014 with acute systolic heart failure. Previous noncompliance. Truck driver. Overall feeling well. Taking medications.  He stated that he would not go that route, cardiac catheterization. We will continue to treat medically. Hopefully he will comply with medical compliance. Low-salt diet. Fluid restriction. Checking BMET. DOT. 2. Coronary artery disease-post bypass/CABG in [facility]. 3. Six-month follow-up  Signed, Candee Furbish, MD [facility]  03/20/2014 11:27 AM

## 2014-03-20 NOTE — Patient Instructions (Addendum)
Your physician has requested that you have an echocardiogram. Echocardiography is a painless test that uses sound waves to create images of your heart. It provides your doctor with information about the size and shape of your heart and how well your heart's chambers and valves are working. This procedure takes approximately one hour. There are no restrictions for this procedure.  Your physician recommends that you have lab work today: BMP   Your physician wants you to follow-up in: 6 MONTHS with Dr [name].  You will receive a reminder letter in the mail two months in advance. If you don't receive a letter, please call our office to schedule the follow-up appointment.  Your physician recommends that you continue on your current medications as directed. Please refer to the Current Medication list given to you today.

## 2014-03-21 ENCOUNTER — Ambulatory Visit (HOSPITAL_COMMUNITY): Payer: BC Managed Care – PPO | Attending: Cardiology | Admitting: Radiology

## 2014-03-21 DIAGNOSIS — I2510 Atherosclerotic heart disease of native coronary artery without angina pectoris: Secondary | ICD-10-CM

## 2014-03-21 DIAGNOSIS — I429 Cardiomyopathy, unspecified: Secondary | ICD-10-CM | POA: Diagnosis not present

## 2014-03-21 DIAGNOSIS — I10 Essential (primary) hypertension: Secondary | ICD-10-CM

## 2014-03-21 DIAGNOSIS — I509 Heart failure, unspecified: Secondary | ICD-10-CM

## 2014-03-21 NOTE — Progress Notes (Signed)
Echocardiogram performed.  

## 2014-03-22 ENCOUNTER — Encounter: Payer: Self-pay | Admitting: Cardiology

## 2014-03-22 ENCOUNTER — Telehealth: Payer: Self-pay | Admitting: Cardiology

## 2014-03-22 ENCOUNTER — Ambulatory Visit (INDEPENDENT_AMBULATORY_CARE_PROVIDER_SITE_OTHER): Payer: BC Managed Care – PPO | Admitting: Cardiology

## 2014-03-22 VITALS — BP 180/102 | HR 64 | Ht 71.0 in | Wt 198.0 lb

## 2014-03-22 DIAGNOSIS — I2510 Atherosclerotic heart disease of native coronary artery without angina pectoris: Secondary | ICD-10-CM

## 2014-03-22 DIAGNOSIS — I517 Cardiomegaly: Secondary | ICD-10-CM

## 2014-03-22 NOTE — Telephone Encounter (Signed)
New Msg   Pt would like results to echo. Please contact at [phone].

## 2014-03-22 NOTE — Telephone Encounter (Signed)
Dr [name] reviewed results of echo.  I called patient with the results.  He is aware his heart pumping function on echo is between 20 and 25%.  Pt very upset AEB raising his voice and making statement such as "I am not going to have a defibrillator" - "what am I supposed to do?  I have a family that depends on me"  And "this just doesn't make any sense" Dr [name] aware of pts comments and concerns.  Pt states he is going to come up and get his records to take to someone to discuss disability.  I informed pt he will need to sign a release of information for medical records.  He hung up on me.  [place] desk staff aware pt will be coming in for records.

## 2014-03-22 NOTE — Progress Notes (Signed)
[facility]. [address], [address] [place], [place]  [address] Phone: [phone] Fax:  [phone]  Date:  03/22/2014   ID:  Roger Martinez, DOB [birth year], MRN [id number]  PCP:  No PCP Per Patient   History of Present Illness: Roger Martinez is a 48 y.o. male with prior history of three-vessel bypass in 2011, coronary artery disease, hypertension here for follow-up. Patient previously left AMA from hospital on 04/07/13 with new diagnosis of systolic heart failure with ejection fraction of 30-35%.   He is a Administrator. He states that he has been taking his medications now. He had to stop a couple times on the road secondary to mouth pain. No angina, no syncopal, no palpitations.  He feels much better. In fact, he played basketball around Thanksgiving. Unfortunately, repeat echocardiogram still demonstrates severely reduced ejection fraction.  Today he was quite upset on the phone and decided to come into the office to be seen. He is affected by his decreased ejection fraction-DOT.     Wt Readings from Last 3 Encounters:  03/22/14 198 lb (89.812 kg)  03/20/14 197 lb (89.359 kg)  03/14/14 196 lb 3.2 oz (88.996 kg)     Past Medical History  Diagnosis Date  . Coronary artery disease   . Hypertension   . Congestive heart failure   . Other and unspecified hyperlipidemia   . Anxiety state, unspecified   . MI (myocardial infarction) 03/2011  . STD (sexually transmitted disease)     Past Surgical History  Procedure Laterality Date  . Bypass graft  2011    3 vessel    Current Outpatient Prescriptions  Medication Sig Dispense Refill  . aspirin EC 325 MG tablet Take 650 mg by mouth daily.     . carvedilol (COREG) 25 MG tablet Take 1 tablet (25 mg total) by mouth 2 (two) times daily with a meal. 60 tablet 3  . furosemide (LASIX) 40 MG tablet Take 1 tablet (40 mg total) by mouth 2 (two) times daily. 60 tablet 3  . hydrALAZINE (APRESOLINE) 50 MG tablet Take 1 tablet (50 mg  total) by mouth 3 (three) times daily. 90 tablet 3  . lisinopril (PRINIVIL,ZESTRIL) 40 MG tablet Take 0.5 tablets (20 mg total) by mouth daily. 30 tablet 3  . potassium chloride SA (KLOR-CON M20) 20 MEQ tablet Take 1 tablet (20 mEq total) by mouth 2 (two) times daily. 60 tablet 3  . simvastatin (ZOCOR) 20 MG tablet Take 2 tablets (40 mg total) by mouth daily. 60 tablet 3   No current facility-administered medications for this visit.    Allergies:   No Known Allergies  Social History:  The patient  reports that he has been smoking Cigarettes.  He has a 7.5 pack-year smoking history. He does not have any smokeless tobacco history on file. 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 Hypertension Mother   . Diabetes Mother     ROS:  Please see the history of present illness.   Denies any syncope, bleeding, positive orthopnea, positive PND, no fevers, chills, rash   All other systems reviewed and negative.   PHYSICAL EXAM: VS:  BP 180/102 mmHg  Pulse 64  Ht 5\' 11"  (1.803 m)  Wt 198 lb (89.812 kg)  BMI 27.63 kg/m2 Well nourished, well developed, in no acute distress HEENT: normal, /AT, EOMI Ext: no edema Skin: warm  and dry GU: deferred Neuro: no focal abnormalities noted, AAO x 3  EKG:  03/20/14-sinus bradycardia rate 56, T-wave inversion in precordial/inferior leads-no change from prior 05/08/13 :Sinus rhythm rate 80 with T-wave inversion through precordial leads as well as inferior leads, possible ischemia. No significant change from prior.  Lab work, hospital records extensively reviewed. 04/06/13-creatinine 1.21, troponin normal, BNP 4500, LDL 118, hemoglobin 15.3  Echocardiogram:   04/06/13  - Left ventricle: The cavity size was normal. Wall thickness was increased in a pattern of severe LVH. Systolic function was moderately to severely reduced. The estimated ejection fraction was in the range  of 30% to 35%. Diffuse hypokinesis. - Aortic valve: Mild regurgitation. - Mitral valve: Mild regurgitation. - Left atrium: The atrium was moderately dilated. - Right atrium: The atrium was mildly dilated. - Pulmonary arteries: PA peak pressure: 33mm Hg (S).  Echocardiogram 03/21/14-ejection fraction 25%, secondary pulmonary hypertension.   ASSESSMENT AND PLAN:  1. Cardiomyopathy/chronic systolic heart failure-EF 25%-30 on repeat ECHO.  Admitted December 2014 with acute systolic heart failure. Previously noncompliance. Truck driver. Overall feeling well. In fact, played basketball on Thanksgiving and day prior. Taking medications. We discussed defibrillator, rationale, increased risk for sudden cardiac death with decreased ejection fraction. He clearly states that he does not wish to proceed with defibrillator consultation. He would not also go the route of repeat cardiac catheterization if necessary. We will continue to treat medically. Hopefully he will continue to comply with medical compliance. Low-salt diet. Fluid restriction. Thankfully, he feels better. NYHA 1-2. He is quite devastated understandably so. 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 He questions about disability. My suggestion is to discuss this with a disability attorney. As is stated above thankfully he is able to function with little to no symptoms at this point. 2. Coronary artery disease-post bypass/CABG in [facility]. 3. Essential hypertension-blood pressure is elevated upon first check. At previous visit he was 130/90. Improved with medication. Continue to encourage medication use. 4. 67-month follow-up  Signed, Candee Furbish, MD [facility]  03/22/2014 4:18 PM

## 2014-03-22 NOTE — Patient Instructions (Signed)
The current medical regimen is effective;  continue present plan and medications.  Follow up in 4 months with Dr. Skains.  You will receive a letter in the mail 2 months before you are due.  Please call us when you receive this letter to schedule your follow up appointment.  

## 2014-03-22 NOTE — Telephone Encounter (Signed)
Labs were normal however echo results has not been reviewed by Dr [name] yet.  Will call pt with results ASAP

## 2014-03-26 ENCOUNTER — Telehealth: Payer: Self-pay | Admitting: Cardiology

## 2014-03-26 NOTE — Telephone Encounter (Signed)
New message      Pt want to proceed with getting a defibulator.  Please call

## 2014-03-26 NOTE — Telephone Encounter (Signed)
Pt aware he is to be set up to see Dr Lovena Le.  He will be expecting a call for an appointment.

## 2014-03-26 NOTE — Telephone Encounter (Signed)
Who do you want me to set him up with?

## 2014-03-26 NOTE — Telephone Encounter (Signed)
Please have him see Dr. Larwance Sachs, Elta Guadeloupe, MD

## 2014-04-21 NOTE — Progress Notes (Signed)
Commercial Driver Medical Examination   Roger Martinez is a 49 y.o. male who presents today for a commercial driver fitness determination physical exam. The patient reports no problems. The following portions of the patient's history were reviewed and updated as appropriate: allergies, current medications, past family history, past medical history, past social history, past surgical history and problem list. Review of Systems A comprehensive review of systems was negative.   Objective:    Vision:  Visual Acuity Screening   Right eye Left eye Both eyes  Without correction: 20/15 20/20 20/15   With correction:     Comments: Peripheral Vision: Right eye  85 degrees. Left eye  The patient can distinguish the colors red, amber and green.85 degrees.  Hearing Screening Comments: The patient was able to hear a forced whisper from 10 feet.    Applicant can recognize and distinguish among traffic control signals and devices showing standard red, green, and amber colors.     Monocular Vision?: No   Hearing: Can hear whisper at 10 ft bilaterally     BP 136/86 mmHg  Pulse 58  Temp(Src) 98.1 F (36.7 C) (Oral)  Resp 16  Ht 5\' 10"  (1.778 m)  Wt 196 lb 3.2 oz (88.996 kg)  BMI 28.15 kg/m2  SpO2 98%  General Appearance:    Alert, cooperative, no distress, appears stated age  Head:    Normocephalic, without obvious abnormality, atraumatic  Eyes:    PERRL, conjunctiva/corneas clear, EOM's intact, fundi    benign, both eyes       Ears:    Normal TM's and external ear canals, both ears  Nose:   Nares normal, septum midline, mucosa normal, no drainage    or sinus tenderness  Throat:   Lips, mucosa, and tongue normal; teeth and gums normal  Neck:   Supple, symmetrical, trachea midline, no adenopathy;       thyroid:  No enlargement/tenderness/nodules; no carotid   bruit or JVD  Back:     Symmetric, no curvature, ROM normal, no CVA tenderness  Lungs:     Clear to auscultation bilaterally,  respirations unlabored  Chest wall:    No tenderness or deformity  Heart:    Regular rate and rhythm, S1 and S2 normal, no murmur, rub   or gallop  Abdomen:     Soft, non-tender, bowel sounds active all four quadrants,    no masses, no organomegaly  Genitalia:    Normal male without lesion, discharge or tenderness     Extremities:   Extremities normal, atraumatic, no cyanosis or edema  Pulses:   2+ and symmetric all extremities  Skin:   Skin color, texture, turgor normal, no rashes or lesions  Lymph nodes:   Cervical, supraclavicular, and axillary nodes normal  Neurologic:   CNII-XII intact. Normal strength, sensation and reflexes      throughout    Labs: Lab Results  Component Value Date   PROTEINUR NEGATIVE 09/20/2013   BILIRUBINUR NEGATIVE 09/20/2013   GLUCOSEU NEGATIVE 09/20/2013   UA: SG 1.010, neg bld, neg sugar, neg protein  Assessment:    Healthy male exam.  Temporarily disqualified due to CHF, CAD w/pror MI, and triple vessel cabg in 2011.  Return to medical examiner's office for follow up on skip.    Plan:    Referred to cardiology for evaluation of echocardiogram.   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 Last echo was 04/06/2013  which showed a 30-35% EF.  With CHF, pt needs an echo annually with EF >40%. Also since pt has a h/o MI and bypass surgery pt will need an exercise tolerance test every 2 yrs - he has not had either of these as he has not followed up with cardiology in several years.  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  Pt should call office when the echo and stress test is complete for my review so hopefully I can issue him a 1 yr card at that time.  Addendum:  He did see cardiology - Dr. [name] - but echo done on 03/21/14 showed EF was worsening from prior to 25-30% so he is still disqualified and is not a candidate at all for a CDL.  Dr. [name]  recommended pt to consider a defibirillator or repeat cabg both of which pt refused. Unfortunately, pt cannot have a CDL card at all if he has a defibrillator placed so pt has refused this but should likely reconsider as he would benefit from the defibrillator and is highly unlikely he will ever be able to get a CDL again due to his poor EF.  See cardiology Dr. 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

## 2014-04-24 ENCOUNTER — Ambulatory Visit (INDEPENDENT_AMBULATORY_CARE_PROVIDER_SITE_OTHER): Payer: PRIVATE HEALTH INSURANCE | Admitting: Internal Medicine

## 2014-04-24 ENCOUNTER — Encounter: Payer: Self-pay | Admitting: Internal Medicine

## 2014-04-24 VITALS — BP 184/106 | HR 78 | Ht 71.0 in | Wt 209.0 lb

## 2014-04-24 DIAGNOSIS — I5022 Chronic systolic (congestive) heart failure: Secondary | ICD-10-CM

## 2014-04-24 DIAGNOSIS — I10 Essential (primary) hypertension: Secondary | ICD-10-CM

## 2014-04-24 DIAGNOSIS — E785 Hyperlipidemia, unspecified: Secondary | ICD-10-CM

## 2014-04-24 DIAGNOSIS — I2510 Atherosclerotic heart disease of native coronary artery without angina pectoris: Secondary | ICD-10-CM

## 2014-04-24 DIAGNOSIS — I519 Heart disease, unspecified: Secondary | ICD-10-CM

## 2014-04-24 NOTE — Patient Instructions (Signed)
Your physician has recommended that you have a defibrillator inserted. An implantable cardioverter defibrillator (ICD) is a small device that is placed in your chest or, in rare cases, your abdomen. This device uses electrical pulses or shocks to help control life-threatening, irregular heartbeats that could lead the heart to suddenly stop beating (sudden cardiac arrest). Leads are attached to the ICD that goes into your heart. This is done in the hospital and usually requires an overnight stay. Please see the instruction sheet given to you today for more information.  Your physician recommends that you schedule a follow-up appointment in: 7 to 10 days from 1/22 in [facility] for wound check.  Your physician recommends that you return for lab work TODAY (BMP/CBC)   Cardioverter Defibrillator Implantation An implantable cardioverter defibrillator (ICD) is a small, lightweight, battery-powered device that is placed (implanted) under the skin in the chest or abdomen. Your caregiver may prescribe an ICD if:  You have had an irregular heart rhythm (arrhythmia) that originated in the lower chambers of the heart (ventricles).  Your heart has been damaged by a disease (such as coronary artery disease) or heart condition (such as a heart attack). An ICD consists of a battery that lasts several years, a small computer called a pulse generator, and wires called leads that go into the heart. It is used to detect and correct two dangerous arrhythmias: a rapid heart rhythm (tachycardia) and an arrhythmia in which the ventricles contract in an uncoordinated way (fibrillation). When an ICD detects tachycardia, it sends an electrical signal to the heart that restores the heartbeat to normal (cardioversion). This signal is usually painless. If cardioversion does not work or if the ICD detects fibrillation, it delivers a small electrical shock to the heart (defibrillation) to restart the heart. The shock may feel like  a strong jolt in the chest.ICDs may be programmed to correct other problems. Sometimes, ICDs are programmed to act as another type of implantable device called a pacemaker. Pacemakers are used to treat a slow heartbeat (bradycardia). LET YOUR CAREGIVER KNOW ABOUT:  Any allergies you have.  All medicines you are taking, including vitamins, herbs, eyedrops, and over-the-counter medicines and creams.  Previous problems you or members of your family have had with the use of anesthetics.  Any blood disorders you have had.  Other health problems you have. RISKS AND COMPLICATIONS Generally, the procedure to implant an ICD is safe. However, as with any surgical procedure, complications can occur. Possible complications associated with implanting an ICD include:  Swelling, bleeding, or bruising at the site where the ICD was implanted.  Infection at the site where the ICD was implanted.  A reaction to medicine used during the procedure.  Nerve, heart, or blood vessel damage.  Blood clots. BEFORE THE PROCEDURE  You may need to have blood tests, heart tests, or a chest X-ray done before the day of the procedure.  Ask your caregiver about changing or stopping your regular medicines.  Make plans to have someone drive you home. You may need to stay in the hospital overnight after the procedure.  Stop smoking at least 24 hours before the procedure.  Take a bath or shower the night before the procedure. You may need to scrub your chest or abdomen with a special type of soap.  Do not eat or drink before your procedure for as long as directed by your caregiver. Ask if it is okay to take any needed medicine with a small sip of water. PROCEDURE  The procedure to implant an ICD in your chest or abdomen is usually done at a hospital in a room that has a large X-ray machine called a fluoroscope. The machine will be above you during the procedure. It will help your caregiver see your heart during the  procedure. Implanting an ICD usually takes 1-3 hours. Before the procedure:   Small monitors will be put on your body. They will be used to check your heart, blood pressure, and oxygen level.  A needle will be put into a vein in your hand or arm. This is called an intravenous (IV) access tube. Fluids and medicine will flow directly into your body through the IV tube.  Your chest or abdomen will be cleaned with a germ-killing (antiseptic) solution. The area may be shaved.  You may be given medicine to help you relax (sedative).  You will be given a medicine called a local anesthetic. This medicine will make the surgical site numb while the ICD is implanted. You will be sleepy but awake during the procedure. After you are numb the procedure will begin. The caregiver will:  Make a small cut (incision). This will make a pocket deep under your skin that will hold the pulse generator.  Guide the leads through a large blood vessel into your heart and attach them to the heart muscles. Depending on the ICD, the leads may go into one ventricle or they may go to both ventricles and into an upper chamber of the heart (atrium).  Test the ICD.  Close the incision with stitches, glue, or staples. AFTER THE PROCEDURE  You may feel pain. Some pain is normal. It may last a few days.  You may stay in a recovery area until the local anesthetic has worn off. Your blood pressure and pulse will be checked often. You will be taken to a room where your heart will be monitored.  A chest X-ray will be taken. This is done to check that the cardioverter defibrillator is in the right place.  You may stay in the hospital overnight.  A slight bump may be seen over the skin where the ICD was placed. Sometimes, it is possible to feel the ICD under the skin. This is normal.  In the months and years afterward, your caregiver will check the device, the leads, and the battery every few months. Eventually, when the battery  is low, the ICD will be replaced. Document Released: 12/19/2001 Document Revised: 01/17/2013 Document Reviewed: 04/17/2012 [facility] Patient Information 2015 [place], [place]. This information is not intended to replace advice given to you by your health care provider. Make sure you discuss any questions you have with your health care provider.

## 2014-04-24 NOTE — Assessment & Plan Note (Signed)
His blood pressure is elevated today as he has been off of his medications for 2 days. Will restart. Additional adjustments in his meds will follow device implant.

## 2014-04-24 NOTE — Assessment & Plan Note (Signed)
He will continue his statin therapy. He will reduce his fat intake. Will recheck fasting lipids in the next few months.

## 2014-04-24 NOTE — Assessment & Plan Note (Signed)
The patient is on maximal medical therapy (he has been out of his meds 2 days but otherwise denies non-compliance) and has persistent LV dysfunction. I have discussed the indications, risks,benefits, goals, and expectations of ICD implant and he wishes to proceed.

## 2014-04-24 NOTE — Progress Notes (Signed)
HPI Mr. Vo is referred today by Dr. [name] for consideration for an ICD implant for chronic systolic heart failure in the setting of an ICM, s/p MI. The patient has never had syncope. He has class 2A heart failure symptoms. He peripheral edema. No anginal symptoms. He has been on maximal medical therapy.  No Known Allergies   Current Outpatient Prescriptions  Medication Sig Dispense Refill  . aspirin EC 325 MG tablet Take 650 mg by mouth daily.     . carvedilol (COREG) 25 MG tablet Take 1 tablet (25 mg total) by mouth 2 (two) times daily with a meal. 60 tablet 3  . furosemide (LASIX) 40 MG tablet Take 1 tablet (40 mg total) by mouth 2 (two) times daily. 60 tablet 3  . hydrALAZINE (APRESOLINE) 50 MG tablet Take 1 tablet (50 mg total) by mouth 3 (three) times daily. 90 tablet 3  . lisinopril (PRINIVIL,ZESTRIL) 40 MG tablet Take 0.5 tablets (20 mg total) by mouth daily. 30 tablet 3  . potassium chloride SA (KLOR-CON M20) 20 MEQ tablet Take 1 tablet (20 mEq total) by mouth 2 (two) times daily. 60 tablet 3  . simvastatin (ZOCOR) 20 MG tablet Take 2 tablets (40 mg total) by mouth daily. 60 tablet 3   No current facility-administered medications for this visit.     Past Medical History  Diagnosis Date  . Coronary artery disease   . Hypertension   . Congestive heart failure   . Other and unspecified hyperlipidemia   . Anxiety state, unspecified   . MI (myocardial infarction) 03/2011  . STD (sexually transmitted disease)     ROS:   All systems reviewed and negative except as noted in the HPI.   Past Surgical History  Procedure Laterality Date  . Bypass graft  2011    3 vessel     Family History  Problem Relation Age of Onset  . Hypertension Mother   . Diabetes Mother      History   Social History  . Marital Status: Married    Spouse Name: N/A    Number of Children: N/A  . Years of Education: N/A   Occupational History  . Not on file.   Social History  Main Topics  . Smoking status: Current Some Day Smoker -- 0.50 packs/day for 15 years    Types: Cigarettes  . Smokeless tobacco: Not on file  . Alcohol Use: No     Comment: ocassionaly  . Drug Use: No  . Sexual Activity: Yes   Other Topics Concern  . Not on file   Social History Narrative   Truck driver    Married   Smokes 1/2 ppd, THC + 12/25                 BP 184/106 mmHg  Pulse 78  Ht 5\' 11"  (1.803 m)  Wt 209 lb (94.802 kg)  BMI 29.16 kg/m2  Physical Exam:  Well appearing middle aged man, NAD HEENT: Unremarkable Neck:  No JVD, no thyromegally Lymphatics:  No adenopathy Back:  No CVA tenderness Lungs:  Clear with no wheezes HEART:  Regular rate rhythm, no murmurs, no rubs, no clicks Abd:  soft, positive bowel sounds, no organomegally, no rebound, no guarding Ext:  2 plus pulses, no edema, no cyanosis, no clubbing Skin:  No rashes no nodules Neuro:  CN II through XII intact, motor grossly intact  EKG - nsr with prior mi. QRS 118.   Assess/Plan:

## 2014-04-24 NOTE — Assessment & Plan Note (Signed)
He is s/p MI, s/p CABG in 2011. He denies anginal symptoms. He will continue his current medical therapy.

## 2014-04-25 LAB — BASIC METABOLIC PANEL
BUN: 15 mg/dL (ref 6–23)
CHLORIDE: 108 meq/L (ref 96–112)
CO2: 23 mEq/L (ref 19–32)
Calcium: 9.8 mg/dL (ref 8.4–10.5)
Creatinine, Ser: 1.03 mg/dL (ref 0.40–1.50)
GFR: 99.67 mL/min (ref >60.00)
Glucose, Bld: 84 mg/dL (ref 70–99)
Potassium: 3.9 mEq/L (ref 3.5–5.1)
Sodium: 139 mEq/L (ref 135–145)

## 2014-04-25 LAB — CBC WITH DIFFERENTIAL/PLATELET
Basophils Absolute: 0 10*3/uL (ref 0.0–0.1)
Basophils Relative: 0.6 % (ref 0.0–3.0)
EOS PCT: 4.8 % (ref 0.0–5.0)
Eosinophils Absolute: 0.4 10*3/uL (ref 0.0–0.7)
HEMATOCRIT: 47.3 % (ref 39.0–52.0)
HEMOGLOBIN: 15.3 g/dL (ref 13.0–17.0)
LYMPHS PCT: 26.9 % (ref 12.0–46.0)
Lymphs Abs: 2 10*3/uL (ref 0.7–4.0)
MCHC: 32.4 g/dL (ref 30.0–36.0)
MCV: 92 fl (ref 78.0–100.0)
Monocytes Absolute: 1.1 10*3/uL — ABNORMAL HIGH (ref 0.1–1.0)
Monocytes Relative: 14.5 % — ABNORMAL HIGH (ref 3.0–12.0)
NEUTROS ABS: 4 10*3/uL (ref 1.4–7.7)
NEUTROS PCT: 53.2 % (ref 43.0–77.0)
Platelets: 259 10*3/uL (ref 150.0–400.0)
RBC: 5.14 Mil/uL (ref 4.22–5.81)
RDW: 15.3 % (ref 11.5–15.5)
WBC: 7.6 10*3/uL (ref 4.0–10.5)

## 2014-04-30 ENCOUNTER — Telehealth: Payer: Self-pay | Admitting: Internal Medicine

## 2014-04-30 NOTE — Telephone Encounter (Signed)
04-30-14 Pt to have ICD 05-03-14.  He was only under his wife's [employer] policy#[id number].  Policy did verify 05-11-27. [place], [phone] and spoke with [name] who stated policy termed 05-17-91 and no renewal or other policy on file.  Spoke w/pt who stated his wife started new job with the "state" on 04-18-14 but her insurance w/[employer] would not start until 06-11-14.  His wife is supposed to find out by 05-01-14 whether she can get her new insurance any earlier.  I advised pt to have his wife check to see why policy termed as he said it wasn't supposed to term until end of this month.  I advised she may be able to get a COBRA policy to cover him until new insurance.  He talk with his wife about this option.  I will call in the morning to see if his policy were in effect or became a COBRA policy whether his ICD would need prior authorization.  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  He does want to have his ICD implanted as scheduled.  I gave him both my direct phone# and my worker, [name] so he can update me with any insurance progress.

## 2014-05-02 ENCOUNTER — Telehealth: Payer: Self-pay | Admitting: Internal Medicine

## 2014-05-02 NOTE — Telephone Encounter (Signed)
Pt to wait until after 06-11-14 to get ICD.  Pt was under wife's [employer] insurance that termed 04-20-14 because she changed jobs and new Scientist, research (life sciences) won't begin until then.  Spoke w/Sherri to cancel for 05-03-14.

## 2014-05-03 ENCOUNTER — Ambulatory Visit (HOSPITAL_COMMUNITY)
Admission: RE | Admit: 2014-05-03 | Payer: BLUE CROSS/BLUE SHIELD | Source: Ambulatory Visit | Admitting: Internal Medicine

## 2014-05-03 ENCOUNTER — Encounter (HOSPITAL_COMMUNITY): Admission: RE | Payer: Self-pay | Source: Ambulatory Visit

## 2014-05-03 SURGERY — IMPLANTABLE CARDIOVERTER DEFIBRILLATOR IMPLANT
Anesthesia: LOCAL

## 2014-05-10 ENCOUNTER — Telehealth: Payer: Self-pay | Admitting: Internal Medicine

## 2014-05-10 NOTE — Telephone Encounter (Signed)
New Msg         Pt states he is returning call from today.    Please call pt.

## 2014-05-10 NOTE — Telephone Encounter (Signed)
Spoke with patient and we will cancel appointment for next week and reschedule his ICD implant for 06/17/14.  He will have his wound check on 3/17 at [facility]

## 2014-05-10 NOTE — Telephone Encounter (Signed)
.  inst

## 2014-05-15 ENCOUNTER — Ambulatory Visit: Payer: BLUE CROSS/BLUE SHIELD | Admitting: Internal Medicine

## 2014-06-14 ENCOUNTER — Other Ambulatory Visit: Payer: Self-pay

## 2014-06-14 ENCOUNTER — Telehealth: Payer: Self-pay

## 2014-06-14 MED ORDER — SIMVASTATIN 20 MG PO TABS: 40.0000 mg | ORAL_TABLET | Freq: Every day | ORAL | Status: DC

## 2014-06-14 MED ORDER — FUROSEMIDE 40 MG PO TABS: 40.0000 mg | ORAL_TABLET | Freq: Two times a day (BID) | ORAL | Status: DC

## 2014-06-14 MED ORDER — POTASSIUM CHLORIDE CRYS ER 20 MEQ PO TBCR: 20.0000 meq | EXTENDED_RELEASE_TABLET | Freq: Two times a day (BID) | ORAL | Status: DC

## 2014-06-14 MED ORDER — LISINOPRIL 40 MG PO TABS: 20.0000 mg | ORAL_TABLET | Freq: Every day | ORAL | Status: DC

## 2014-06-14 MED ORDER — HYDRALAZINE HCL 50 MG PO TABS: 50.0000 mg | ORAL_TABLET | Freq: Three times a day (TID) | ORAL | Status: DC

## 2014-06-14 MED ORDER — NITROGLYCERIN 0.4 MG SL SUBL: 0.4000 mg | SUBLINGUAL_TABLET | SUBLINGUAL | Status: DC | PRN

## 2014-06-14 MED ORDER — CARVEDILOL 25 MG PO TABS: 25.0000 mg | ORAL_TABLET | Freq: Two times a day (BID) | ORAL | Status: DC

## 2014-06-14 NOTE — Telephone Encounter (Signed)
OK to refill

## 2014-06-16 DIAGNOSIS — Z951 Presence of aortocoronary bypass graft: Secondary | ICD-10-CM | POA: Diagnosis not present

## 2014-06-16 DIAGNOSIS — F419 Anxiety disorder, unspecified: Secondary | ICD-10-CM | POA: Diagnosis not present

## 2014-06-16 DIAGNOSIS — I5022 Chronic systolic (congestive) heart failure: Secondary | ICD-10-CM | POA: Diagnosis not present

## 2014-06-16 DIAGNOSIS — I252 Old myocardial infarction: Secondary | ICD-10-CM | POA: Diagnosis not present

## 2014-06-16 DIAGNOSIS — I10 Essential (primary) hypertension: Secondary | ICD-10-CM | POA: Diagnosis not present

## 2014-06-16 DIAGNOSIS — I255 Ischemic cardiomyopathy: Secondary | ICD-10-CM | POA: Diagnosis present

## 2014-06-16 DIAGNOSIS — N529 Male erectile dysfunction, unspecified: Secondary | ICD-10-CM | POA: Diagnosis not present

## 2014-06-16 DIAGNOSIS — I2510 Atherosclerotic heart disease of native coronary artery without angina pectoris: Secondary | ICD-10-CM | POA: Diagnosis not present

## 2014-06-16 DIAGNOSIS — E785 Hyperlipidemia, unspecified: Secondary | ICD-10-CM | POA: Diagnosis not present

## 2014-06-16 MED ORDER — SODIUM CHLORIDE 0.9 % IR SOLN
80.0000 mg | Status: DC
  Filled 2014-06-16: qty 2

## 2014-06-16 MED ORDER — CEFAZOLIN SODIUM-DEXTROSE 2-3 GM-% IV SOLR: 2.0000 g | INTRAVENOUS | Status: DC

## 2014-06-17 ENCOUNTER — Ambulatory Visit (HOSPITAL_COMMUNITY)
Admission: RE | Admit: 2014-06-17 | Discharge: 2014-06-18 | Disposition: A | Discharge status: Home / Self Care | Payer: PRIVATE HEALTH INSURANCE | Source: Ambulatory Visit | Attending: Internal Medicine | Admitting: Internal Medicine

## 2014-06-17 ENCOUNTER — Encounter (HOSPITAL_COMMUNITY)
Admission: RE | Disposition: A | Discharge status: Home / Self Care | Payer: PRIVATE HEALTH INSURANCE | Source: Ambulatory Visit | Attending: Internal Medicine

## 2014-06-17 ENCOUNTER — Encounter (HOSPITAL_COMMUNITY): Payer: Self-pay | Admitting: General Practice

## 2014-06-17 DIAGNOSIS — I5022 Chronic systolic (congestive) heart failure: Secondary | ICD-10-CM | POA: Insufficient documentation

## 2014-06-17 DIAGNOSIS — Z959 Presence of cardiac and vascular implant and graft, unspecified: Secondary | ICD-10-CM

## 2014-06-17 DIAGNOSIS — I252 Old myocardial infarction: Secondary | ICD-10-CM | POA: Insufficient documentation

## 2014-06-17 DIAGNOSIS — N529 Male erectile dysfunction, unspecified: Secondary | ICD-10-CM | POA: Insufficient documentation

## 2014-06-17 DIAGNOSIS — I10 Essential (primary) hypertension: Secondary | ICD-10-CM | POA: Diagnosis present

## 2014-06-17 DIAGNOSIS — I2510 Atherosclerotic heart disease of native coronary artery without angina pectoris: Secondary | ICD-10-CM | POA: Insufficient documentation

## 2014-06-17 DIAGNOSIS — I255 Ischemic cardiomyopathy: Secondary | ICD-10-CM

## 2014-06-17 DIAGNOSIS — F419 Anxiety disorder, unspecified: Secondary | ICD-10-CM | POA: Insufficient documentation

## 2014-06-17 DIAGNOSIS — E785 Hyperlipidemia, unspecified: Secondary | ICD-10-CM | POA: Insufficient documentation

## 2014-06-17 DIAGNOSIS — Z951 Presence of aortocoronary bypass graft: Secondary | ICD-10-CM | POA: Insufficient documentation

## 2014-06-17 HISTORY — PX: EP IMPLANTABLE DEVICE: SHX172B

## 2014-06-17 HISTORY — PX: IMPLANTABLE CARDIOVERTER DEFIBRILLATOR IMPLANT: SHX5473

## 2014-06-17 LAB — CBC
HEMATOCRIT: 45.5 % (ref 39.0–52.0)
Hemoglobin: 15.3 g/dL (ref 13.0–17.0)
MCH: 30.2 pg (ref 26.0–34.0)
MCHC: 33.6 g/dL (ref 30.0–36.0)
MCV: 89.9 fL (ref 78.0–100.0)
PLATELETS: 239 10*3/uL (ref 150–400)
RBC: 5.06 MIL/uL (ref 4.22–5.81)
RDW: 14.7 % (ref 11.5–15.5)
WBC: 6.8 10*3/uL (ref 4.0–10.5)

## 2014-06-17 LAB — BASIC METABOLIC PANEL
ANION GAP: 7 (ref 5–15)
BUN: 15 mg/dL (ref 6–23)
CO2: 25 mmol/L (ref 19–32)
Calcium: 8.9 mg/dL (ref 8.4–10.5)
Chloride: 107 mmol/L (ref 96–112)
Creatinine, Ser: 1.12 mg/dL (ref 0.50–1.35)
GFR calc Af Amer: 89 mL/min — ABNORMAL LOW (ref >90)
GFR calc non Af Amer: 77 mL/min — ABNORMAL LOW (ref >90)
GLUCOSE: 95 mg/dL (ref 70–99)
POTASSIUM: 4.6 mmol/L (ref 3.5–5.1)
SODIUM: 139 mmol/L (ref 135–145)

## 2014-06-17 LAB — SURGICAL PCR SCREEN
MRSA, PCR: NEGATIVE
STAPHYLOCOCCUS AUREUS: NEGATIVE

## 2014-06-17 SURGERY — IMPLANTABLE CARDIOVERTER DEFIBRILLATOR IMPLANT
Anesthesia: LOCAL

## 2014-06-17 MED ORDER — CARVEDILOL 25 MG PO TABS
25.0000 mg | ORAL_TABLET | Freq: Two times a day (BID) | ORAL | Status: DC
  Administered 2014-06-17 – 2014-06-18 (×2): 25 mg via ORAL
  Filled 2014-06-17 (×4): qty 1

## 2014-06-17 MED ORDER — SIMVASTATIN 40 MG PO TABS
40.0000 mg | ORAL_TABLET | Freq: Every day | ORAL | Status: DC
  Administered 2014-06-17: 40 mg via ORAL
  Filled 2014-06-17 (×2): qty 1

## 2014-06-17 MED ORDER — ASPIRIN EC 81 MG PO TBEC
81.0000 mg | DELAYED_RELEASE_TABLET | Freq: Every day | ORAL | Status: DC
  Administered 2014-06-17 – 2014-06-18 (×2): 81 mg via ORAL
  Filled 2014-06-17 (×2): qty 1

## 2014-06-17 MED ORDER — FENTANYL CITRATE 0.05 MG/ML IJ SOLN
INTRAMUSCULAR | Status: AC
  Filled 2014-06-17: qty 2

## 2014-06-17 MED ORDER — MIDAZOLAM HCL 5 MG/5ML IJ SOLN
INTRAMUSCULAR | Status: AC
  Filled 2014-06-17: qty 5

## 2014-06-17 MED ORDER — ONDANSETRON HCL 4 MG/2ML IJ SOLN: 4.0000 mg | Freq: Four times a day (QID) | INTRAMUSCULAR | Status: DC | PRN

## 2014-06-17 MED ORDER — ACETAMINOPHEN 325 MG PO TABS
325.0000 mg | ORAL_TABLET | ORAL | Status: DC | PRN
  Administered 2014-06-17 – 2014-06-18 (×2): 650 mg via ORAL
  Filled 2014-06-17 (×2): qty 2

## 2014-06-17 MED ORDER — MUPIROCIN 2 % EX OINT
1.0000 "application " | TOPICAL_OINTMENT | Freq: Once | CUTANEOUS | Status: AC
  Administered 2014-06-17: 1 via TOPICAL
  Filled 2014-06-17: qty 22

## 2014-06-17 MED ORDER — LABETALOL HCL 5 MG/ML IV SOLN
INTRAVENOUS | Status: AC
  Filled 2014-06-17: qty 4

## 2014-06-17 MED ORDER — CHLORHEXIDINE GLUCONATE 4 % EX LIQD
60.0000 mL | Freq: Once | CUTANEOUS | Status: DC
  Filled 2014-06-17: qty 60

## 2014-06-17 MED ORDER — LABETALOL HCL 5 MG/ML IV SOLN
20.0000 mg | Freq: Once | INTRAVENOUS | Status: AC
  Administered 2014-06-17: 20 mg via INTRAVENOUS
  Filled 2014-06-17: qty 4

## 2014-06-17 MED ORDER — POTASSIUM CHLORIDE CRYS ER 20 MEQ PO TBCR
20.0000 meq | EXTENDED_RELEASE_TABLET | Freq: Two times a day (BID) | ORAL | Status: DC
  Administered 2014-06-17 – 2014-06-18 (×3): 20 meq via ORAL
  Filled 2014-06-17 (×4): qty 1

## 2014-06-17 MED ORDER — HEPARIN (PORCINE) IN NACL 2-0.9 UNIT/ML-% IJ SOLN
INTRAMUSCULAR | Status: AC
  Filled 2014-06-17: qty 500

## 2014-06-17 MED ORDER — SODIUM CHLORIDE 0.9 % IV SOLN
INTRAVENOUS | Status: DC
  Administered 2014-06-17: 07:00:00 via INTRAVENOUS

## 2014-06-17 MED ORDER — CEFAZOLIN SODIUM 1-5 GM-% IV SOLN
1.0000 g | Freq: Four times a day (QID) | INTRAVENOUS | Status: AC
Start: 2014-06-17 — End: 2014-06-18
  Administered 2014-06-17 – 2014-06-18 (×3): 1 g via INTRAVENOUS
  Filled 2014-06-17 (×3): qty 50

## 2014-06-17 MED ORDER — CARVEDILOL 25 MG PO TABS
25.0000 mg | ORAL_TABLET | Freq: Two times a day (BID) | ORAL | Status: DC
  Administered 2014-06-17: 25 mg via ORAL
  Filled 2014-06-17 (×2): qty 1

## 2014-06-17 MED ORDER — FUROSEMIDE 40 MG PO TABS
40.0000 mg | ORAL_TABLET | Freq: Two times a day (BID) | ORAL | Status: DC
  Administered 2014-06-17 – 2014-06-18 (×2): 40 mg via ORAL
  Filled 2014-06-17 (×4): qty 1

## 2014-06-17 MED ORDER — NITROGLYCERIN 0.4 MG SL SUBL: 0.4000 mg | SUBLINGUAL_TABLET | SUBLINGUAL | Status: DC | PRN

## 2014-06-17 MED ORDER — SODIUM CHLORIDE 0.9 % IV SOLN
INTRAVENOUS | Status: AC
  Administered 2014-06-17: 12:00:00 via INTRAVENOUS

## 2014-06-17 MED ORDER — LISINOPRIL 20 MG PO TABS
20.0000 mg | ORAL_TABLET | Freq: Every day | ORAL | Status: DC
  Administered 2014-06-17 – 2014-06-18 (×2): 20 mg via ORAL
  Filled 2014-06-17 (×2): qty 1

## 2014-06-17 MED ORDER — MUPIROCIN 2 % EX OINT
TOPICAL_OINTMENT | CUTANEOUS | Status: AC
  Administered 2014-06-17: 1 via TOPICAL
  Filled 2014-06-17: qty 22

## 2014-06-17 MED ORDER — LIDOCAINE HCL (PF) 1 % IJ SOLN
INTRAMUSCULAR | Status: AC
  Filled 2014-06-17: qty 60

## 2014-06-17 MED ORDER — SPIRONOLACTONE 25 MG PO TABS
25.0000 mg | ORAL_TABLET | Freq: Every day | ORAL | Status: DC
  Administered 2014-06-17 – 2014-06-18 (×2): 25 mg via ORAL
  Filled 2014-06-17 (×2): qty 1

## 2014-06-17 MED ORDER — HYDRALAZINE HCL 50 MG PO TABS
50.0000 mg | ORAL_TABLET | Freq: Three times a day (TID) | ORAL | Status: DC
  Administered 2014-06-17 – 2014-06-18 (×3): 50 mg via ORAL
  Filled 2014-06-17 (×5): qty 1

## 2014-06-17 NOTE — H&P (Signed)
Patient Care Team: Dorothy Spark, MD as Consulting Physician (Cardiology)   HPI  Roger Martinez is a 49 y.o. male With hx of IMI and CABG 5 years ago and now with mild heart failure EF persistently below 30-35 most recent 12/15 202-25  pooly controlled BP denies snoring  No edema but DOE <1 stairs  No syncope or palpitations  Past Medical History  Diagnosis Date  . Coronary artery disease   . Hypertension   . Congestive heart failure   . Other and unspecified hyperlipidemia   . Anxiety state, unspecified   . MI (myocardial infarction) 03/2011  . STD (sexually transmitted disease)     Past Surgical History  Procedure Laterality Date  . Bypass graft  2011    3 vessel    Current Facility-Administered Medications  Medication Dose Route Frequency Provider Last Rate Last Dose  . 0.9 %  sodium chloride infusion   Intravenous Continuous Evans Lance, MD 50 mL/hr at 06/17/14 [phone]    . ceFAZolin (ANCEF) IVPB 2 g/50 mL premix  2 g Intravenous On Call Evans Lance, MD      . chlorhexidine (HIBICLENS) 4 % liquid 4 application  60 mL Topical Once Evans Lance, MD      . gentamicin (GARAMYCIN) 80 mg in sodium chloride irrigation 0.9 % 500 mL irrigation  80 mg Irrigation On Call Evans Lance, MD         Medication List    ASK your doctor about these medications        aspirin 325 MG tablet  Take 325 mg by mouth daily.     carvedilol 25 MG tablet  Commonly known as:  COREG  Take 1 tablet (25 mg total) by mouth 2 (two) times daily with a meal.     furosemide 40 MG tablet  Commonly known as:  LASIX  Take 1 tablet (40 mg total) by mouth 2 (two) times daily.     hydrALAZINE 50 MG tablet  Commonly known as:  APRESOLINE  Take 1 tablet (50 mg total) by mouth 3 (three) times daily.     lisinopril 40 MG tablet  Commonly known as:  PRINIVIL,ZESTRIL  Take 0.5 tablets (20 mg total) by mouth daily.     nitroGLYCERIN 0.4 MG SL tablet  Commonly known as:   NITROSTAT  Place 1 tablet (0.4 mg total) under the tongue every 5 (five) minutes as needed for chest pain.     potassium chloride SA 20 MEQ tablet  Commonly known as:  KLOR-CON M20  Take 1 tablet (20 mEq total) by mouth 2 (two) times daily.     simvastatin 20 MG tablet  Commonly known as:  ZOCOR  Take 2 tablets (40 mg total) by mouth daily.          No Known Allergies  Review of Systems negative except from HPI and PMH  Physical Exam BP 175/105 mmHg  Pulse 68  Temp(Src) 97.5 F (36.4 C) (Oral)  Resp 18  Ht 5\' 11"  (1.803 m)  Wt 204 lb (92.534 kg)  BMI 28.46 kg/m2  SpO2 98% Well developed and well nourished in no acute distress HENT normal E scleral and icterus clear Neck Supple JVP flat; carotids brisk and full Clear to ausculation  Regular rate and rhythm, no murmurs gallops or rub Soft with active bowel sounds No clubbing cyanosis  none Edema Alert and oriented, grossly normal motor and sensory function Skin Warm and  Dry  ECG  Sinus with anrrow QRS  Assessment and  Plan  Ischemic cardiomyopathy  CHF chronic systolic  HTN poorly controlled  LVH severe  Erectile dysfunction   He is appropriately considered for ICD implant for primary prevention  His BP needs augmented therapy and will add aldactone post procedure  The addition of nitrates to his hydralazine is a more difficult decision as ED is problem for this young man  He also had questions about DOT exclusions wth ICD  He was under the misimpression that EF 35% or greater with or without ICD would qualify him for certification  My understanding and review this am suggests that this is not the case, although there was an open comment period two years ago addressing this qustion  i do not see resolution  Have reviewed the potential benefits and risks of ICD implantation including but not limited to death, perforation of heart or lung, lead dislodgement, infection,  device malfunction and inappropriate  shocks.  The patient express understanding  and are willing to proceed.

## 2014-06-17 NOTE — CV Procedure (Signed)
Mutasim Tuckey [id number]  [id number]  Preop Dx: ischemic cardiomyopathy Postop Dx same/    Procedure: single chamber ICD implantation without intraoperative defibrillation threshold testing  Following the obtaining of informed consent the patient was brought to the electrophysiology laboratory in place of the fluoroscopic table in the supine position. After routine prep and drape, lidocaine was infiltrated in the prepectoral subclavicular region and an incision was made and carried down to the layer of the prepectoral fascia using electrocautery and sharp dissection. A pocket was formed similarly.  Thereafter an attention was turned to gain access to the extrathoracic left subclavian vein which was accomplished without difficulty and without the aspiration of air or puncture of the artery. A 9 French sheath was placed after a small sheath allowed for passing of a super stifff wire into the IVC as there was major tortuosity of the inominate vein W  then passed a [employer]0293single  coil  active fixation defibrillator lead, serial number [id number]. It was  passed under fluoroscopic guidance to the right ventricular apex. In its location the bipolar R wave was 15.8 millivolts, impedance was 721 ohms, the pacing threshold was 0.7 volts at 0.5 msec. Current at threshold was 1.0 mA.  There was no diaphragmatic pacing at 10 V. The current of injury was brisk .  The lead was secured to the prepectoral fascia and then attached to a [place], serial number  [id number].  Through the device, the bipolar R wave was 16.9 millivolts, impedance was 744 ohms, the pacing threshold was 0.7 volts at 0.4 msec. High-voltage impedance was 59 ohms.  The pocket was copiously irrigated with antibiotic containing saline solution. Hemostasis was assured, and the device and the lead was placed in the pocket and secured to the prepectoral fascia.  The wound was closed in 2  layers in normal fashion. The wound was  washed dried and a DERMABOND  was then applied. Needle counts sponge counts and instrument counts were correct at the end of the procedure according to the staff.   Patient tolerated the procedure without apparent complication  EBL  Minimal     Cx: None     Virl Axe, MD 06/17/2014 8:56 AM

## 2014-06-17 NOTE — Progress Notes (Signed)
Dr [name] paged for elevated bp, wants to give pt iv labetalol once and give coreg now. 2:20 PM Sherrie Mustache

## 2014-06-17 NOTE — Discharge Instructions (Signed)
° ° °  Supplemental Discharge Instructions for  Pacemaker/Defibrillator Patients  Activity No heavy lifting or vigorous activity with your left/right arm for 6 to 8 weeks.  Do not raise your left/right arm above your head for one week.  Gradually raise your affected arm as drawn below.           _         06/21/14                        06/22/14                  06/23/14                 3/14/16_  NO DRIVING for  1 week   ; you may begin driving on  10/06/01   .  WOUND CARE - Keep the wound area clean and dry.  Do not get this area wet for one week. No showers for one week; you may shower on  06/24/14   . - The glue on your wound will fall off; do not pull them off.  No bandage is needed on the site.  DO  NOT apply any creams, oils, or ointments to the wound area. - If you notice any drainage or discharge from the wound, any swelling or bruising at the site, or you develop a fever > 101? F after you are discharged home, call the office at once.  Special Instructions - You are still able to use cellular telephones; use the ear opposite the side where you have your pacemaker/defibrillator.  Avoid carrying your cellular phone near your device. - When traveling through airports, show security personnel your identification card to avoid being screened in the metal detectors.  Ask the security personnel to use the hand wand. - Avoid arc welding equipment, MRI testing (magnetic resonance imaging), TENS units (transcutaneous nerve stimulators).  Call the office for questions about other devices. - Avoid electrical appliances that are in poor condition or are not properly grounded. - Microwave ovens are safe to be near or to operate.  Additional information for defibrillator patients should your device go off: - If your device goes off ONCE and you feel fine afterward, notify the device clinic nurses. - If your device goes off ONCE and you do not feel well afterward, call 911. - If your device goes off  TWICE, call 911. - If your device goes off THREE times in one day, call 911.  DO NOT DRIVE YOURSELF OR A FAMILY MEMBER WITH A DEFIBRILLATOR TO THE HOSPITAL--CALL 911.

## 2014-06-17 NOTE — Interval H&P Note (Signed)
ICD Criteria  Current LVEF:20% ;Obtained > 3 months ago and < or = 6 months ago.   NYHA Functional Classification: Class III  Heart Failure History:  Yes, Duration of heart failure since onset is 3 to 9 months  Non-Ischemic Dilated Cardiomyopathy History:  No.  Atrial Fibrillation/Atrial Flutter:  No.  Ventricular Tachycardia History:  No.  Cardiac Arrest History:  No  History of Syndromes with Risk of Sudden Death:  No.  Previous ICD:  No.  Electrophysiology Study: No.  Prior MI: Yes, Most recent MI timeframe is > 40 days.  PPM: No.  OSA:  No  Patient Life Expectancy of >=1 year: Yes.  Anticoagulation Therapy:  Patient is NOT on anticoagulation therapy.   Beta Blocker Therapy:  Yes.   Ace Inhibitor/ARB Therapy:  Yes.History and Physical Interval Note:  06/17/2014 8:03 AM  Roger Martinez  has presented today for surgery, with the diagnosis of chf lv dysfunction  The various methods of treatment have been discussed with the patient and family. After consideration of risks, benefits and other options for treatment, the patient has consented to  Procedure(s): IMPLANTABLE CARDIOVERTER DEFIBRILLATOR IMPLANT (N/A) as a surgical intervention .  The patient's history has been reviewed, patient examined, no change in status, stable for surgery.  I have reviewed the patient's chart and labs.  Questions were answered to the patient's satisfaction.     Roger Martinez

## 2014-06-17 NOTE — Progress Notes (Addendum)
MD paged, pt with 14beat vtach on tele. Sherrie Mustache 6:28 PM   Dr. Rayann Heman aware and acknowledged. 6:33 PM

## 2014-06-17 NOTE — Discharge Summary (Addendum)
ELECTROPHYSIOLOGY PROCEDURE DISCHARGE SUMMARY    Patient ID: Roger Martinez,  MRN: [id number], DOB/AGE: [id number] 47 y.o.  Admit date: 06/17/2014 Discharge date: 06/18/2014  Primary Care Physician: No primary care provider on file. Primary Cardiologist: Roger Martinez Electrophysiologist: Roger Martinez  Primary Discharge Diagnosis:  Ischemic cardiomyopathy status post ICD implant this admission  Secondary Discharge Diagnosis:  1.  Chronic systolic heart failure 2.  CAD s/p CABG 3.  Hypertension 4.  Anxiety  No Known Allergies   Procedures This Admission:  1.  Implantation of a BSX single chamber ICD on 06/17/14 by Dr Roger Martinez. See implant note for full details.  DFT's were deferred at time of implant.  There were no immediate post procedure complications. 2.  CXR on 06/18/14 demonstrated no pneumothorax status post device implantation.   Brief HPI: Roger Martinez is a 49 y.o. male was referred to electrophysiology in the outpatient setting for consideration of ICD implantation.  Past medical history includes ischemic cardiomyopathy and chronic systolic heart failure.  The patient has persistent LV dysfunction despite guideline directed therapy.  Risks, benefits, and alternatives to ICD implantation were reviewed with the patient who wished to proceed.   Hospital Course:  The patient was admitted and underwent implantation of a BSX single chamber ICD with details as outlined above. He was monitored on telemetry overnight which demonstrated sinus rhythm with PVC's.  Left chest was without hematoma or ecchymosis.  The device was interrogated and found to be functioning normally.  CXR was obtained and demonstrated no pneumothorax status post device implantation.  Wound care, arm mobility, and restrictions were reviewed with the patient.  The patient was examined and considered stable for discharge to home.   Blood pressure elevated this admission, will add spironolactone at discharge.  Stop K supplement,  BMET on Friday.   The patient's discharge medications include an ACE-I (Lisinopril) and beta blocker (Carvedilol).   Physical Exam: Filed Vitals:   06/17/14 1653 06/17/14 2040 06/18/14 0006 06/18/14 0507  BP: 171/110 161/107  145/101  Pulse: 79 72  68  Temp:  98.6 F (37 C) 99.1 F (37.3 C) 98.3 F (36.8 C)  TempSrc:  Oral  Oral  Resp:  18  16  Height:      Weight:      SpO2:  98%  98%    GEN- The patient is well appearing, alert and oriented x 3 today.   HEENT: normocephalic, atraumatic; sclera clear, conjunctiva pink; hearing intact; oropharynx clear; neck supple, no JVP Lymph- no cervical lymphadenopathy Lungs- Clear to ausculation bilaterally, normal work of breathing.  No wheezes, rales, rhonchi Heart- Regular rate and rhythm, no murmurs, rubs or gallops +S3 GI- soft, non-tender, non-distended, bowel sounds present, no hepatosplenomegaly Extremities- no clubbing, cyanosis, or edema; DP/PT/radial pulses 2+ bilaterally MS- no significant deformity or atrophy Skin- warm and dry, no rash or lesion, left chest without hematoma/ecchymosis, steri-strips placed today as median part of incision did not have dermabond adhered.  Psych- euthymic mood, full affect Neuro- strength and sensation are intact   Labs:   Lab Results  Component Value Date   WBC 6.8 06/17/2014   HGB 15.3 06/17/2014   HCT 45.5 06/17/2014   MCV 89.9 06/17/2014   PLT 239 06/17/2014     Recent Labs Lab 06/17/14 0602  NA 139  K 4.6  CL 107  CO2 25  BUN 15  CREATININE 1.12  CALCIUM 8.9  GLUCOSE 95    Discharge Medications:    Medication List  STOP taking these medications        potassium chloride SA 20 MEQ tablet  Commonly known as:  KLOR-CON M20      TAKE these medications        aspirin 325 MG tablet  Take 325 mg by mouth daily.     carvedilol 25 MG tablet  Commonly known as:  COREG  Take 1 tablet (25 mg total) by mouth 2 (two) times daily with a meal.     furosemide 40 MG  tablet  Commonly known as:  LASIX  Take 1 tablet (40 mg total) by mouth 2 (two) times daily.     hydrALAZINE 100 MG tablet  Commonly known as:  APRESOLINE  Take 0.5 tablets (50 mg total) by mouth 3 (three) times daily.     lisinopril 40 MG tablet  Commonly known as:  PRINIVIL,ZESTRIL  Take 0.5 tablets (20 mg total) by mouth daily.     nitroGLYCERIN 0.4 MG SL tablet  Commonly known as:  NITROSTAT  Place 1 tablet (0.4 mg total) under the tongue every 5 (five) minutes as needed for chest pain.     simvastatin 20 MG tablet  Commonly known as:  ZOCOR  Take 2 tablets (40 mg total) by mouth daily.     spironolactone 25 MG tablet  Commonly known as:  ALDACTONE  Take 1 tablet (25 mg total) by mouth daily.        Disposition:   Follow-up Information    Follow up with [facility] On 06/26/2014.   Why:  at [employer] information:   [place] [id number]       Follow up with Roger Furbish, MD On 07/22/2014.   Specialty:  Cardiology   Why:  at [place] information:   [address] [place] [address] [phone]       Follow up with Roger Axe, MD On 07/11/2014.   Specialty:  Cardiology   Why:  at 12:30PM   Contact information:   [address] [address] [phone]       Follow up with [facility] On 06/21/2014.   Specialty:  Cardiology   Why:  for labs.  You do not need to be fasting.  Ok to come anytime between [employer] information:   [address] [place] [phone]      Duration of Discharge Encounter: Greater than 30 minutes including physician time.  Signed, Roger Marshall, NP 06/18/2014 8:35 AM  Device function normla  Pt remains emotionally strung out by the changes in life, sex and work reltaed to his cardiac issues  Will work on counseling May benefit from antidepressant Add  aldactone Stop K Needs otupt sleepstudy

## 2014-06-18 ENCOUNTER — Ambulatory Visit (HOSPITAL_COMMUNITY): Payer: PRIVATE HEALTH INSURANCE

## 2014-06-18 DIAGNOSIS — I255 Ischemic cardiomyopathy: Secondary | ICD-10-CM

## 2014-06-18 MED ORDER — HYDRALAZINE HCL 100 MG PO TABS: 100.0000 mg | ORAL_TABLET | Freq: Three times a day (TID) | ORAL | Status: DC

## 2014-06-18 MED ORDER — HYDRALAZINE HCL 100 MG PO TABS: 50.0000 mg | ORAL_TABLET | Freq: Three times a day (TID) | ORAL | Status: DC

## 2014-06-18 MED ORDER — SPIRONOLACTONE 25 MG PO TABS: 25.0000 mg | ORAL_TABLET | Freq: Every day | ORAL | Status: DC

## 2014-06-18 NOTE — Progress Notes (Signed)
Pt/family given discharge instructions, medication lists, follow up appointments, and when to call the doctor.  Pt/family verbalizes understanding. Pt given signs and symptoms of infection. Pt given information on new medications.  All questions answered. Pt has ICD box with materials for home. Payton Emerald, RN

## 2014-06-20 ENCOUNTER — Telehealth: Payer: Self-pay | Admitting: Cardiology

## 2014-06-20 NOTE — Telephone Encounter (Signed)
New Msg      Pt calling upset that his appt isn't scheduled for tomorrow.   Advised pt appt is for 06/26/14 at 9am, pt states Dr. [name] wants him to come tomorrow.   No Notes to confirm, please contact pt.

## 2014-06-20 NOTE — Telephone Encounter (Signed)
Called patient back. He is worried about his incision at the new device that was just implanted because he states that is is oozing some blood that he noticed on his shirt. Steri strips are still in place but he has no gauze over the wound. Denies any puffiness surrounding the device implant. States that he is "freaking out" about his wound. Advised since it is 5 pm now and the office will be closing, he should report to the ER to have the wound looked at now. Reminded him about his upcoming appointments. Patient agreed with the above recommendation.

## 2014-06-26 ENCOUNTER — Ambulatory Visit (INDEPENDENT_AMBULATORY_CARE_PROVIDER_SITE_OTHER): Payer: PRIVATE HEALTH INSURANCE | Admitting: *Deleted

## 2014-06-26 ENCOUNTER — Other Ambulatory Visit (INDEPENDENT_AMBULATORY_CARE_PROVIDER_SITE_OTHER): Payer: PRIVATE HEALTH INSURANCE | Admitting: *Deleted

## 2014-06-26 VITALS — BP 142/78 | HR 57

## 2014-06-26 DIAGNOSIS — I5022 Chronic systolic (congestive) heart failure: Secondary | ICD-10-CM

## 2014-06-26 DIAGNOSIS — I10 Essential (primary) hypertension: Secondary | ICD-10-CM

## 2014-06-26 DIAGNOSIS — I255 Ischemic cardiomyopathy: Secondary | ICD-10-CM

## 2014-06-26 LAB — BASIC METABOLIC PANEL
BUN: 15 mg/dL (ref 6–23)
CO2: 29 meq/L (ref 19–32)
CREATININE: 1.02 mg/dL (ref 0.40–1.50)
Calcium: 9.6 mg/dL (ref 8.4–10.5)
Chloride: 106 mEq/L (ref 96–112)
GFR: 100.73 mL/min (ref >60.00)
Glucose, Bld: 83 mg/dL (ref 70–99)
POTASSIUM: 4.1 meq/L (ref 3.5–5.1)
Sodium: 138 mEq/L (ref 135–145)

## 2014-06-26 LAB — MDC_IDC_ENUM_SESS_TYPE_INCLINIC
HighPow Impedance: 65 Ohm
Implantable Pulse Generator Serial Number: 201422
Lead Channel Impedance Value: 442 Ohm
Lead Channel Pacing Threshold Amplitude: 1.1 V
Lead Channel Sensing Intrinsic Amplitude: 25 mV
Lead Channel Setting Pacing Amplitude: 3.5 V
Lead Channel Setting Pacing Pulse Width: 0.4 ms
Lead Channel Setting Sensing Sensitivity: 0.6 mV
MDC IDC MSMT LEADCHNL RV PACING THRESHOLD PULSEWIDTH: 0.4 ms
MDC IDC SESS DTM: 20160316040000
MDC IDC SET ZONE DETECTION INTERVAL: 250 ms
MDC IDC STAT BRADY RV PERCENT PACED: 1 % — AB
Zone Setting Detection Interval: 300 ms

## 2014-06-26 NOTE — Addendum Note (Signed)
Addended by: Eulis Foster on: 06/26/2014 09:34 AM   Modules accepted: Orders

## 2014-06-26 NOTE — Progress Notes (Signed)
Pt seen in clinic for follow up of ICD and wound check.  No complaints of chest pain, shortness of breath, dizziness, palpitations, or shocks.  Device functioning normally at this time. Wound well healed, edges approximated. No redness, swelling, drainage. Moderate ecchymosis noted- no hematoma present. Patient instructed about wound care and activity restrictions. Made aware that he is medically released to go back to work despite his request for note to return to work in 18 days.  For full details, see [employer] report.  No programming changes made today.  Plan to follow up 07/11/14 with SK.  Ranee Gosselin, RN, BSN 06/26/2014 9:42 AM

## 2014-07-11 ENCOUNTER — Encounter: Payer: Self-pay | Admitting: Internal Medicine

## 2014-07-11 ENCOUNTER — Ambulatory Visit (INDEPENDENT_AMBULATORY_CARE_PROVIDER_SITE_OTHER): Payer: PRIVATE HEALTH INSURANCE | Admitting: Internal Medicine

## 2014-07-11 VITALS — BP 142/88 | HR 66 | Ht 71.0 in | Wt 203.0 lb

## 2014-07-11 DIAGNOSIS — I10 Essential (primary) hypertension: Secondary | ICD-10-CM

## 2014-07-11 DIAGNOSIS — I5022 Chronic systolic (congestive) heart failure: Secondary | ICD-10-CM | POA: Diagnosis not present

## 2014-07-11 DIAGNOSIS — Z4502 Encounter for adjustment and management of automatic implantable cardiac defibrillator: Secondary | ICD-10-CM

## 2014-07-11 DIAGNOSIS — I255 Ischemic cardiomyopathy: Secondary | ICD-10-CM

## 2014-07-11 NOTE — Progress Notes (Signed)
      Patient Care Team: Dorothy Spark, MD as Consulting Physician (Cardiology)   HPI  Roger Martinez is a 49 y.o. male  Seen following ICD implantation a couple of weeks ago. He is a patient of Dr. Tanna Furry with ischemic heart disease and prior bypass surgery chronic systolic heart failure and hypertension. I ended up doing the implantation because of scheduling issues.     Past Medical History  Diagnosis Date  . Coronary artery disease   . Hypertension   . Congestive heart failure   . Other and unspecified hyperlipidemia   . Anxiety state, unspecified   . MI (myocardial infarction) 03/2011  . STD (sexually transmitted disease)     Past Surgical History  Procedure Laterality Date  . Bypass graft  2011    3 vessel  . Ep implantable device  06/17/2014    DR [name]  . Implantable cardioverter defibrillator implant N/A 06/17/2014    Procedure: IMPLANTABLE CARDIOVERTER DEFIBRILLATOR IMPLANT;  Surgeon: Deboraha Sprang, MD;  Location: [facility] CATH LAB;  Service: Cardiovascular;  Laterality: N/A;    Current Outpatient Prescriptions  Medication Sig Dispense Refill  . aspirin 325 MG tablet Take 325 mg by mouth daily.    . carvedilol (COREG) 25 MG tablet Take 1 tablet (25 mg total) by mouth 2 (two) times daily with a meal. 60 tablet 3  . furosemide (LASIX) 40 MG tablet Take 1 tablet (40 mg total) by mouth 2 (two) times daily. 60 tablet 3  . hydrALAZINE (APRESOLINE) 100 MG tablet Take 0.5 tablets (50 mg total) by mouth 3 (three) times daily. 90 tablet 3  . lisinopril (PRINIVIL,ZESTRIL) 40 MG tablet Take 0.5 tablets (20 mg total) by mouth daily. (Patient taking differently: Take 40 mg by mouth daily. ) 30 tablet 3  . nitroGLYCERIN (NITROSTAT) 0.4 MG SL tablet Place 1 tablet (0.4 mg total) under the tongue every 5 (five) minutes as needed for chest pain. 25 tablet 3  . simvastatin (ZOCOR) 20 MG tablet Take 2 tablets (40 mg total) by mouth daily. 60 tablet 3  . spironolactone (ALDACTONE) 25  MG tablet Take 1 tablet (25 mg total) by mouth daily. 30 tablet 1   No current facility-administered medications for this visit.    No Known Allergies  Review of Systems negative except from HPI and PMH  Physical Exam BP 142/88 mmHg  Pulse 66  Ht 5\' 11"  (1.803 m)  Wt 203 lb (92.08 kg)  BMI 28.33 kg/m2 Well developed and well nourished in no acute distress HENT normal E scleral and icterus clear Neck Supple JVP flat; carotids brisk and full Clear to ausculation buriising on chest wallRegular rate and rhythm, no murmurs gallops or rub Soft with active bowel sounds No clubbing cyanosis  Edema Alert and oriented, grossly normal motor and sensory function Skin Warm and Dry  ECG demonstrates sinus rhythm at 66 Intervals 18/11/48 Diffuse T-wave inversions II, III, and F V3-V6   Assessment and  Plan  Ischemic cardiomyopathy  Hypertension  ED  We will continue his current medications and did suggest that he could simplify his regime by taking hydralazine twice a day perhaps 100 mg   I willhve him talk to Dr [name] re PDE whihch i see no contraindication

## 2014-07-22 ENCOUNTER — Ambulatory Visit (INDEPENDENT_AMBULATORY_CARE_PROVIDER_SITE_OTHER): Payer: PRIVATE HEALTH INSURANCE | Admitting: Cardiology

## 2014-07-22 ENCOUNTER — Encounter: Payer: Self-pay | Admitting: Cardiology

## 2014-07-22 VITALS — BP 148/92 | HR 61 | Ht 71.0 in | Wt 199.0 lb

## 2014-07-22 DIAGNOSIS — I255 Ischemic cardiomyopathy: Secondary | ICD-10-CM

## 2014-07-22 DIAGNOSIS — I10 Essential (primary) hypertension: Secondary | ICD-10-CM

## 2014-07-22 DIAGNOSIS — N521 Erectile dysfunction due to diseases classified elsewhere: Secondary | ICD-10-CM

## 2014-07-22 DIAGNOSIS — I2510 Atherosclerotic heart disease of native coronary artery without angina pectoris: Secondary | ICD-10-CM | POA: Diagnosis not present

## 2014-07-22 MED ORDER — SILDENAFIL CITRATE 100 MG PO TABS: 100.0000 mg | ORAL_TABLET | Freq: Every day | ORAL | Status: DC | PRN

## 2014-07-22 NOTE — Progress Notes (Signed)
[place]. [address], [address], [place]  [address] Phone: [phone] Fax:  [phone]  Date:  07/22/2014   ID:  Roger Martinez, DOB 1965/10/21, MRN [id number]  PCP:  No primary care provider on file.   History of Present Illness: Roger Martinez is a 49 y.o. male with prior history of three-vessel bypass in 2011, coronary artery disease, hypertension here for follow-up.ICD placed. Dr. Lovena Le patient.  Patient previously left AMA from hospital on 04/07/13 with new diagnosis of systolic heart failure with ejection fraction of 30-35%. His EF has been repeated and was 20/25 percent. Because of this, defibrillator was placed. He was quite thankful on today's visit. He seems to have turned the corner with his medical compliance. Excellent.  He was a Administrator. Bruise left of sternum. Basketball.  2 miles a day walking. Feels good. No shortness of breath, no chest pain.    Wt Readings from Last 3 Encounters:  07/22/14 199 lb (90.266 kg)  07/11/14 203 lb (92.08 kg)  06/17/14 204 lb (92.534 kg)     Past Medical History  Diagnosis Date  . Coronary artery disease   . Hypertension   . Congestive heart failure   . Other and unspecified hyperlipidemia   . Anxiety state, unspecified   . MI (myocardial infarction) 03/2011  . STD (sexually transmitted disease)     Past Surgical History  Procedure Laterality Date  . Bypass graft  2011    3 vessel  . Ep implantable device  06/17/2014    DR [name]  . Implantable cardioverter defibrillator implant N/A 06/17/2014    Procedure: IMPLANTABLE CARDIOVERTER DEFIBRILLATOR IMPLANT;  Surgeon: Deboraha Sprang, MD;  Location: [facility] CATH LAB;  Service: Cardiovascular;  Laterality: N/A;    Current Outpatient Prescriptions  Medication Sig Dispense Refill  . aspirin 81 MG tablet Take 81 mg by mouth daily.    . carvedilol (COREG) 25 MG tablet Take 1 tablet (25 mg total) by mouth 2 (two) times daily with a meal. 60 tablet 3  . furosemide (LASIX) 40  MG tablet Take 1 tablet (40 mg total) by mouth 2 (two) times daily. 60 tablet 3  . hydrALAZINE (APRESOLINE) 50 MG tablet Take 50 mg by mouth 3 (three) times daily.    Marland Kitchen lisinopril (PRINIVIL,ZESTRIL) 40 MG tablet Take 0.5 tablets (20 mg total) by mouth daily. (Patient taking differently: Take 40 mg by mouth daily. ) 30 tablet 3  . nitroGLYCERIN (NITROSTAT) 0.4 MG SL tablet Place 1 tablet (0.4 mg total) under the tongue every 5 (five) minutes as needed for chest pain. 25 tablet 3  . simvastatin (ZOCOR) 20 MG tablet Take 2 tablets (40 mg total) by mouth daily. (Patient taking differently: Take 20 mg by mouth every other day. ) 60 tablet 3  . spironolactone (ALDACTONE) 25 MG tablet Take 1 tablet (25 mg total) by mouth daily. 30 tablet 1   No current facility-administered medications for this visit.    Allergies:   No Known Allergies  Social History:  The patient  reports that he has been smoking Cigarettes.  He has a 7.5 pack-year smoking history. He has never used smokeless tobacco. 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 Hypertension Mother   . Diabetes Mother     ROS:  Please see the history of present illness.   Denies any syncope, bleeding, positive orthopnea,  positive PND, no fevers, chills, rash   All other systems reviewed and negative.   PHYSICAL EXAM: VS:  BP 148/92 mmHg  Pulse 61  Ht 5\' 11"  (1.803 m)  Wt 199 lb (90.266 kg)  BMI 27.77 kg/m2  SpO2 98% Well nourished, well developed, in no acute distress HEENT: normal, [place]/AT, EOMI Ext: no edema Skin: warm and dry, mild bruising left of sternum.  GU: deferred Neuro: no focal abnormalities noted, AAO x 3  EKG:  03/20/14-sinus bradycardia rate 56, T-wave inversion in precordial/inferior leads-no change from prior 05/08/13 :Sinus rhythm rate 80 with T-wave inversion through precordial leads as well as inferior leads, possible ischemia. No  significant change from prior.  Lab work, hospital records extensively reviewed. 3/16-creatinine 1.02, troponin normal, BNP 4500, LDL 118, hemoglobin 15.3  Echocardiogram:   04/06/14 - Left ventricle: The cavity size was normal. Wall thickness was increased in a pattern of severe LVH. Systolic function was moderately to severely reduced. The estimated ejection fraction was in the range of 20-25%. Diffuse hypokinesis. - Aortic valve: Mild regurgitation. - Mitral valve: Mild regurgitation. - Left atrium: The atrium was moderately dilated. - Right atrium: The atrium was mildly dilated. - Pulmonary arteries: PA peak pressure: 33mm Hg (S).  ASSESSMENT AND PLAN:  1. Cardiomyopathy/chronic systolic heart failure-EF 25%-30 on repeat ECHO.  Admitted December 2014 with acute systolic heart failure. Previously noncompliance. Truck driver. Defibrillator in place. Teary-eyed at times in the past.. Low-salt diet. Fluid restriction. Thankfully, he feels better. NYHA 1-2.  2. Coronary artery disease-post bypass/CABG in [facility]. 3. Essential hypertension-blood pressure is elevated upon first check. At previous visit he was 130/90. Improved with medication. Continue to encourage medication use. 4. Erectile dysfunction-discussed with Dr. [name] as well. We would be fine with him being on agent. We will go ahead and give him Viagra 100mg . He may cut those if needed.  5. Hyperlipidemia-taking simvastatin 20 mg every other day. As he is having myalgias. 6. 29-month follow-up  Signed, Candee Furbish, MD [facility]  07/22/2014 9:15 AM

## 2014-07-22 NOTE — Patient Instructions (Addendum)
May use Viagra as needed.  Do not use SL Nitroglycerin when using Viagra. Continue all other medications as listed.  Follow up in 4 months with Dr. [name].  You will receive a letter in the mail 2 months before you are due.  Please call us when you receive this letter to schedule your follow up appointment.  Thank you for choosing [facility]!!

## 2014-09-17 ENCOUNTER — Encounter: Payer: PRIVATE HEALTH INSURANCE | Admitting: Internal Medicine

## 2014-09-30 ENCOUNTER — Telehealth: Payer: Self-pay | Admitting: Cardiology

## 2014-09-30 MED ORDER — SILDENAFIL CITRATE 100 MG PO TABS: 100.0000 mg | ORAL_TABLET | Freq: Every day | ORAL | Status: DC | PRN

## 2014-09-30 NOTE — Telephone Encounter (Signed)
Contacted pt verified pharmacy and pt aware Viagra refill was being sent in today.  Pt has no additional questions at this time.

## 2014-09-30 NOTE — Telephone Encounter (Signed)
New Message  Pt calling about Viagra refill- was told refill dept does not handle that RX. Please call back and discuss.

## 2014-09-30 NOTE — Telephone Encounter (Signed)
OK to refill. Candee Furbish, MD

## 2014-10-04 ENCOUNTER — Encounter: Payer: PRIVATE HEALTH INSURANCE | Admitting: Internal Medicine

## 2014-10-15 ENCOUNTER — Encounter (HOSPITAL_BASED_OUTPATIENT_CLINIC_OR_DEPARTMENT_OTHER): Payer: Self-pay | Admitting: *Deleted

## 2014-10-15 ENCOUNTER — Emergency Department (HOSPITAL_BASED_OUTPATIENT_CLINIC_OR_DEPARTMENT_OTHER)
Admission: EM | Admit: 2014-10-15 | Discharge: 2014-10-15 | Disposition: A | Discharge status: Home / Self Care | Payer: PRIVATE HEALTH INSURANCE | Attending: Emergency Medicine | Admitting: Emergency Medicine

## 2014-10-15 DIAGNOSIS — Z7982 Long term (current) use of aspirin: Secondary | ICD-10-CM | POA: Insufficient documentation

## 2014-10-15 DIAGNOSIS — Z79899 Other long term (current) drug therapy: Secondary | ICD-10-CM | POA: Insufficient documentation

## 2014-10-15 DIAGNOSIS — Z951 Presence of aortocoronary bypass graft: Secondary | ICD-10-CM | POA: Insufficient documentation

## 2014-10-15 DIAGNOSIS — Z8659 Personal history of other mental and behavioral disorders: Secondary | ICD-10-CM | POA: Insufficient documentation

## 2014-10-15 DIAGNOSIS — Z8619 Personal history of other infectious and parasitic diseases: Secondary | ICD-10-CM | POA: Insufficient documentation

## 2014-10-15 DIAGNOSIS — I10 Essential (primary) hypertension: Secondary | ICD-10-CM | POA: Insufficient documentation

## 2014-10-15 DIAGNOSIS — Z95810 Presence of automatic (implantable) cardiac defibrillator: Secondary | ICD-10-CM | POA: Insufficient documentation

## 2014-10-15 DIAGNOSIS — E785 Hyperlipidemia, unspecified: Secondary | ICD-10-CM | POA: Insufficient documentation

## 2014-10-15 DIAGNOSIS — Z202 Contact with and (suspected) exposure to infections with a predominantly sexual mode of transmission: Secondary | ICD-10-CM

## 2014-10-15 DIAGNOSIS — Z720 Tobacco use: Secondary | ICD-10-CM | POA: Insufficient documentation

## 2014-10-15 DIAGNOSIS — I2510 Atherosclerotic heart disease of native coronary artery without angina pectoris: Secondary | ICD-10-CM | POA: Insufficient documentation

## 2014-10-15 DIAGNOSIS — I509 Heart failure, unspecified: Secondary | ICD-10-CM | POA: Insufficient documentation

## 2014-10-15 DIAGNOSIS — I252 Old myocardial infarction: Secondary | ICD-10-CM | POA: Insufficient documentation

## 2014-10-15 DIAGNOSIS — N4889 Other specified disorders of penis: Secondary | ICD-10-CM | POA: Insufficient documentation

## 2014-10-15 LAB — URINALYSIS, ROUTINE W REFLEX MICROSCOPIC
Bilirubin Urine: NEGATIVE
GLUCOSE, UA: NEGATIVE mg/dL
HGB URINE DIPSTICK: NEGATIVE
KETONES UR: NEGATIVE mg/dL
LEUKOCYTES UA: NEGATIVE
Nitrite: NEGATIVE
Protein, ur: NEGATIVE mg/dL
Specific Gravity, Urine: 1.007 (ref 1.005–1.030)
Urobilinogen, UA: 0.2 mg/dL (ref 0.0–1.0)
pH: 6 (ref 5.0–8.0)

## 2014-10-15 MED ORDER — METRONIDAZOLE 500 MG PO TABS: 500.0000 mg | ORAL_TABLET | Freq: Two times a day (BID) | ORAL | Status: DC

## 2014-10-15 MED ORDER — LIDOCAINE HCL (PF) 1 % IJ SOLN
INTRAMUSCULAR | Status: AC
  Administered 2014-10-15: 2.3 mL
  Filled 2014-10-15: qty 5

## 2014-10-15 MED ORDER — CEFTRIAXONE SODIUM 250 MG IJ SOLR
250.0000 mg | Freq: Once | INTRAMUSCULAR | Status: AC
  Administered 2014-10-15: 250 mg via INTRAMUSCULAR
  Filled 2014-10-15: qty 250

## 2014-10-15 MED ORDER — AZITHROMYCIN 250 MG PO TABS
1000.0000 mg | ORAL_TABLET | Freq: Once | ORAL | Status: AC
  Administered 2014-10-15: 1000 mg via ORAL
  Filled 2014-10-15: qty 4

## 2014-10-15 NOTE — ED Provider Notes (Signed)
CSN: [id number]     Arrival date & time 10/15/14  1307 History   First MD Initiated Contact with Patient 10/15/14 1309     Chief Complaint  Patient presents with  . SEXUALLY TRANSMITTED DISEASE     (Consider location/radiation/quality/duration/timing/severity/associated sxs/prior Treatment) HPI Comments: 49 y/o M presenting for STD treatment as he was told he was exposed to trichomonas. He and his wife recently separated, and they had intercourse yesterday when she told him she had trichomonas. States yesterday he had an "odor" of his penis. Denies penile discharge, dysuria, hematuria or urine odor. Denies fever, chills, abdominal pain, rash.  The history is provided by the patient.    Past Medical History  Diagnosis Date  . Coronary artery disease   . Hypertension   . Congestive heart failure   . Other and unspecified hyperlipidemia   . Anxiety state, unspecified   . MI (myocardial infarction) 03/2011  . STD (sexually transmitted disease)    Past Surgical History  Procedure Laterality Date  . Bypass graft  2011    3 vessel  . Ep implantable device  06/17/2014    DR [name]  . Implantable cardioverter defibrillator implant N/A 06/17/2014    Procedure: IMPLANTABLE CARDIOVERTER DEFIBRILLATOR IMPLANT;  Surgeon: Deboraha Sprang, MD;  Location: [facility] CATH LAB;  Service: Cardiovascular;  Laterality: N/A;   Family History  Problem Relation Age of Onset  . Hypertension Mother   . Diabetes Mother    History  Substance Use Topics  . Smoking status: Current Some Day Smoker -- 0.50 packs/day for 15 years    Types: Cigarettes  . Smokeless tobacco: Never Used  . Alcohol Use: No     Comment: ocassionaly    Review of Systems  Genitourinary:       + Odor from penis.  All other systems reviewed and are negative.     Allergies  Review of patient's allergies indicates no known allergies.  Home Medications   Prior to Admission medications   Medication Sig Start Date End Date Taking?  Authorizing Provider  aspirin 81 MG tablet Take 81 mg by mouth daily.    Historical Provider, MD  carvedilol (COREG) 25 MG tablet Take 1 tablet (25 mg total) by mouth 2 (two) times daily with a meal. 06/14/14   Jerline Pain, MD  furosemide (LASIX) 40 MG tablet Take 1 tablet (40 mg total) by mouth 2 (two) times daily. 06/14/14   Jerline Pain, MD  hydrALAZINE (APRESOLINE) 50 MG tablet Take 50 mg by mouth 2 (two) times daily.    Historical Provider, MD  lisinopril (PRINIVIL,ZESTRIL) 40 MG tablet Take 0.5 tablets (20 mg total) by mouth daily. Patient taking differently: Take 40 mg by mouth daily.  06/14/14   Jerline Pain, MD  metroNIDAZOLE (FLAGYL) 500 MG tablet Take 1 tablet (500 mg total) by mouth 2 (two) times daily. One po bid x 7 days 10/15/14   Carman Ching, PA-C  nitroGLYCERIN (NITROSTAT) 0.4 MG SL tablet Place 1 tablet (0.4 mg total) under the tongue every 5 (five) minutes as needed for chest pain. 06/14/14   Jerline Pain, MD  sildenafil (VIAGRA) 100 MG tablet Take 1 tablet (100 mg total) by mouth daily as needed for erectile dysfunction. 09/30/14   Jerline Pain, MD  simvastatin (ZOCOR) 20 MG tablet Take 2 tablets (40 mg total) by mouth daily. Patient taking differently: Take 20 mg by mouth every other day.  06/14/14   Jerline Pain,  MD  spironolactone (ALDACTONE) 25 MG tablet Take 1 tablet (25 mg total) by mouth daily. 06/18/14   Amber Sena Slate, NP   BP 162/101 mmHg  Pulse 66  Temp(Src) 98.3 F (36.8 C) (Oral)  Resp 18  Ht 5\' 11"  (1.803 m)  Wt 194 lb (87.998 kg)  BMI 27.07 kg/m2  SpO2 98% Physical Exam  Constitutional: He is oriented to person, place, and time. He appears well-developed and well-nourished. No distress.  HENT:  Head: Normocephalic and atraumatic.  Eyes: Conjunctivae and EOM are normal.  Neck: Normal range of motion. Neck supple.  Cardiovascular: Normal rate, regular rhythm and normal heart sounds.   Pulmonary/Chest: Effort normal and breath sounds normal.  Genitourinary:  Testes normal. Right testis shows no mass, no swelling and no tenderness. Left testis shows no mass, no swelling and no tenderness. Circumcised. No penile erythema or penile tenderness. No discharge found.  Musculoskeletal: Normal range of motion. He exhibits no edema.  Neurological: He is alert and oriented to person, place, and time.  Skin: Skin is warm and dry.  Psychiatric: He has a normal mood and affect. His behavior is normal.  Nursing note and vitals reviewed.   ED Course  Procedures (including critical care time) Labs Review Labs Reviewed  URINALYSIS, ROUTINE W REFLEX MICROSCOPIC (NOT AT [facility])  GC/CHLAMYDIA PROBE AMP () NOT AT [facility]    Imaging Review No results found.   EKG Interpretation None      MDM   Final diagnoses:  STD exposure   NAD. No penile discharge. Gc/chlamydia cultures pending. UA obtained to eval for trich. Rocephin/azithromycin given in ED. D/c with flagyl rx. Infection care/precautions discussed. Stable for d/c. Return precautions given. Patient states understanding of treatment care plan and is agreeable.   Carman Ching, PA-C 10/15/14 1332  Virgel Manifold, MD 10/16/14 [phone]

## 2014-10-15 NOTE — Discharge Instructions (Signed)
Take flagyl twice daily for 1 week. Do not come in contact with any alcohol with this medication. No intercourse for 10 days after taking the medication. This is the treatment for trichomonas.  Sexually Transmitted Disease A sexually transmitted disease (STD) is a disease or infection that may be passed (transmitted) from person to person, usually during sexual activity. This may happen by way of saliva, semen, blood, vaginal mucus, or urine. Common STDs include:   Gonorrhea.   Chlamydia.   Syphilis.   HIV and AIDS.   Genital herpes.   Hepatitis B and C.   Trichomonas.   Human papillomavirus (HPV).   Pubic lice.   Scabies.  Mites.  Bacterial vaginosis. WHAT ARE CAUSES OF STDs? An STD may be caused by bacteria, a virus, or parasites. STDs are often transmitted during sexual activity if one person is infected. However, they may also be transmitted through nonsexual means. STDs may be transmitted after:   Sexual intercourse with an infected person.   Sharing sex toys with an infected person.   Sharing needles with an infected person or using unclean piercing or tattoo needles.  Having intimate contact with the genitals, mouth, or rectal areas of an infected person.   Exposure to infected fluids during birth. WHAT ARE THE SIGNS AND SYMPTOMS OF STDs? Different STDs have different symptoms. Some people may not have any symptoms. If symptoms are present, they may include:   Painful or bloody urination.   Pain in the pelvis, abdomen, vagina, anus, throat, or eyes.   A skin rash, itching, or irritation.  Growths, ulcerations, blisters, or sores in the genital and anal areas.  Abnormal vaginal discharge with or without bad odor.   Penile discharge in men.   Fever.   Pain or bleeding during sexual intercourse.   Swollen glands in the groin area.   Yellow skin and eyes (jaundice). This is seen with hepatitis.   Swollen  testicles.  Infertility.  Sores and blisters in the mouth. HOW ARE STDs DIAGNOSED? To make a diagnosis, your health care provider may:   Take a medical history.   Perform a physical exam.   Take a sample of any discharge to examine.  Swab the throat, cervix, opening to the penis, rectum, or vagina for testing.  Test a sample of your first morning urine.   Perform blood tests.   Perform a Pap test, if this applies.   Perform a colposcopy.   Perform a laparoscopy.  HOW ARE STDs TREATED? Treatment depends on the STD. Some STDs may be treated but not cured.   Chlamydia, gonorrhea, trichomonas, and syphilis can be cured with antibiotic medicine.   Genital herpes, hepatitis, and HIV can be treated, but not cured, with prescribed medicines. The medicines lessen symptoms.   Genital warts from HPV can be treated with medicine or by freezing, burning (electrocautery), or surgery. Warts may come back.   HPV cannot be cured with medicine or surgery. However, abnormal areas may be removed from the cervix, vagina, or vulva.   If your diagnosis is confirmed, your recent sexual partners need treatment. This is true even if they are symptom-free or have a negative culture or evaluation. They should not have sex until their health care providers say it is okay. HOW CAN I REDUCE MY RISK OF GETTING AN STD? Take these steps to reduce your risk of getting an STD:  Use latex condoms, dental dams, and water-soluble lubricants during sexual activity. Do not use petroleum jelly or  oils.  Avoid having multiple sex partners.  Do not have sex with someone who has other sex partners.  Do not have sex with anyone you do not know or who is at high risk for an STD.  Avoid risky sex practices that can break your skin.  Do not have sex if you have open sores on your mouth or skin.  Avoid drinking too much alcohol or taking illegal drugs. Alcohol and drugs can affect your judgment and put  you in a vulnerable position.  Avoid engaging in oral and anal sex acts.  Get vaccinated for HPV and hepatitis. If you have not received these vaccines in the past, talk to your health care provider about whether one or both might be right for you.   If you are at risk of being infected with HIV, it is recommended that you take a prescription medicine daily to prevent HIV infection. This is called pre-exposure prophylaxis (PrEP). You are considered at risk if:  You are a man who has sex with other men (MSM).  You are a heterosexual man or woman and are sexually active with more than one partner.  You take drugs by injection.  You are sexually active with a partner who has HIV.  Talk with your health care provider about whether you are at high risk of being infected with HIV. If you choose to begin PrEP, you should first be tested for HIV. You should then be tested every 3 months for as long as you are taking PrEP.  WHAT SHOULD I DO IF I THINK I HAVE AN STD?  See your health care provider.   Tell your sexual partner(s). They should be tested and treated for any STDs.  Do not have sex until your health care provider says it is okay. WHEN SHOULD I GET IMMEDIATE MEDICAL CARE? Contact your health care provider right away if:   You have severe abdominal pain.  You are a man and notice swelling or pain in your testicles.  You are a woman and notice swelling or pain in your vagina. Document Released: 06/19/2002 Document Revised: 04/03/2013 Document Reviewed: 10/17/2012 [facility] Patient Information 2015 [place], [place]. This information is not intended to replace advice given to you by your health care provider. Make sure you discuss any questions you have with your health care provider.  Trichomoniasis Trichomoniasis is an infection caused by an organism called Trichomonas. The infection can affect both women and men. In women, the outer male genitalia and the vagina are affected.  In men, the penis is mainly affected, but the prostate and other reproductive organs can also be involved. Trichomoniasis is a sexually transmitted infection (STI) and is most often passed to another person through sexual contact.  RISK FACTORS  Having unprotected sexual intercourse.  Having sexual intercourse with an infected partner. SIGNS AND SYMPTOMS  Symptoms of trichomoniasis in women include:  Abnormal gray-green frothy vaginal discharge.  Itching and irritation of the vagina.  Itching and irritation of the area outside the vagina. Symptoms of trichomoniasis in men include:   Penile discharge with or without pain.  Pain during urination. This results from inflammation of the urethra. DIAGNOSIS  Trichomoniasis may be found during a Pap test or physical exam. Your health care provider may use one of the following methods to help diagnose this infection:  Examining vaginal discharge under a microscope. For men, urethral discharge would be examined.  Testing the pH of the vagina with a test tape.  Using  a vaginal swab test that checks for the Trichomonas organism. A test is available that provides results within a few minutes.  Doing a culture test for the organism. This is not usually needed. TREATMENT   You may be given medicine to fight the infection. Women should inform their health care provider if they could be or are pregnant. Some medicines used to treat the infection should not be taken during pregnancy.  Your health care provider may recommend over-the-counter medicines or creams to decrease itching or irritation.  Your sexual partner will need to be treated if infected. HOME CARE INSTRUCTIONS   Take medicines only as directed by your health care provider.  Take over-the-counter medicine for itching or irritation as directed by your health care provider.  Do not have sexual intercourse while you have the infection.  Women should not douche or wear tampons  while they have the infection.  Discuss your infection with your partner. Your partner may have gotten the infection from you, or you may have gotten it from your partner.  Have your sex partner get examined and treated if necessary.  Practice safe, informed, and protected sex.  See your health care provider for other STI testing. SEEK MEDICAL CARE IF:   You still have symptoms after you finish your medicine.  You develop abdominal pain.  You have pain when you urinate.  You have bleeding after sexual intercourse.  You develop a rash.  Your medicine makes you sick or makes you throw up (vomit). MAKE SURE YOU:  Understand these instructions.  Will watch your condition.  Will get help right away if you are not doing well or get worse. Document Released: 09/22/2000 Document Revised: 08/13/2013 Document Reviewed: 01/08/2013 [facility] Patient Information 2015 [place], [place]. This information is not intended to replace advice given to you by your health care provider. Make sure you discuss any questions you have with your health care provider.

## 2014-10-15 NOTE — ED Notes (Signed)
STD exposure. Odor.

## 2014-10-16 LAB — GC/CHLAMYDIA PROBE AMP (~~LOC~~) NOT AT ARMC
CHLAMYDIA, DNA PROBE: NEGATIVE
NEISSERIA GONORRHEA: NEGATIVE

## 2014-11-13 ENCOUNTER — Encounter: Payer: Self-pay | Admitting: Internal Medicine

## 2014-11-13 ENCOUNTER — Ambulatory Visit (INDEPENDENT_AMBULATORY_CARE_PROVIDER_SITE_OTHER): Payer: PRIVATE HEALTH INSURANCE | Admitting: Internal Medicine

## 2014-11-13 VITALS — BP 140/76 | HR 59 | Ht 71.0 in | Wt 201.2 lb

## 2014-11-13 DIAGNOSIS — I5022 Chronic systolic (congestive) heart failure: Secondary | ICD-10-CM

## 2014-11-13 DIAGNOSIS — I255 Ischemic cardiomyopathy: Secondary | ICD-10-CM

## 2014-11-13 DIAGNOSIS — Z4502 Encounter for adjustment and management of automatic implantable cardiac defibrillator: Secondary | ICD-10-CM | POA: Diagnosis not present

## 2014-11-13 LAB — CUP PACEART INCLINIC DEVICE CHECK
Brady Statistic RV Percent Paced: 1 % — CL
Date Time Interrogation Session: 20160803040000
HighPow Impedance: 58 Ohm
Lead Channel Impedance Value: 407 Ohm
Lead Channel Pacing Threshold Pulse Width: 0.4 ms
Lead Channel Setting Pacing Pulse Width: 0.4 ms
Lead Channel Setting Sensing Sensitivity: 0.6 mV
MDC IDC MSMT LEADCHNL RV PACING THRESHOLD AMPLITUDE: 2 V
MDC IDC MSMT LEADCHNL RV SENSING INTR AMPL: 25 mV — AB
MDC IDC SET LEADCHNL RV PACING AMPLITUDE: 3 V
MDC IDC SET ZONE DETECTION INTERVAL: 300 ms
Pulse Gen Serial Number: 201422
Zone Setting Detection Interval: 250 ms

## 2014-11-13 MED ORDER — SACUBITRIL-VALSARTAN 24-26 MG PO TABS: 1.0000 | ORAL_TABLET | Freq: Two times a day (BID) | ORAL | Status: DC

## 2014-11-13 NOTE — Progress Notes (Signed)
Patient Care Team: Dorothy Spark, MD as Consulting Physician (Cardiology)   HPI  Roger Martinez is a 49 y.o. male  Seen following ICD implantationm 3/16  The patient denies chest pain, shortness of breath, nocturnal dyspnea, orthopnea or peripheral edema.  There have been no palpitations, lightheadedness or syncope.   He is having a.m. discomfort in his chest after he drinks tea or caffeine.   He is a patient of Dr. Tanna Furry with ischemic heart disease and prior bypass surgery chronic systolic heart failure and hypertension. I ended up doing the implantation because of scheduling issues.     Past Medical History  Diagnosis Date  . Coronary artery disease   . Hypertension   . Congestive heart failure   . Other and unspecified hyperlipidemia   . Anxiety state, unspecified   . MI (myocardial infarction) 03/2011  . STD (sexually transmitted disease)     Past Surgical History  Procedure Laterality Date  . Bypass graft  2011    3 vessel  . Ep implantable device  06/17/2014    DR [name]  . Implantable cardioverter defibrillator implant N/A 06/17/2014    Procedure: IMPLANTABLE CARDIOVERTER DEFIBRILLATOR IMPLANT;  Surgeon: Deboraha Sprang, MD;  Location: [facility] CATH LAB;  Service: Cardiovascular;  Laterality: N/A;    Current Outpatient Prescriptions  Medication Sig Dispense Refill  . aspirin 81 MG tablet Take 81 mg by mouth daily.    . carvedilol (COREG) 25 MG tablet Take 1 tablet (25 mg total) by mouth 2 (two) times daily with a meal. 60 tablet 3  . furosemide (LASIX) 40 MG tablet Take 1 tablet (40 mg total) by mouth 2 (two) times daily. 60 tablet 3  . hydrALAZINE (APRESOLINE) 50 MG tablet Take 50 mg by mouth 2 (two) times daily.    Marland Kitchen lisinopril (PRINIVIL,ZESTRIL) 40 MG tablet Take 40 mg by mouth daily. Patient takes 1/2 tablet daily    . nitroGLYCERIN (NITROSTAT) 0.4 MG SL tablet Place 1 tablet (0.4 mg total) under the tongue every 5 (five) minutes as needed for chest  pain. 25 tablet 3  . sildenafil (VIAGRA) 100 MG tablet Take 1 tablet (100 mg total) by mouth daily as needed for erectile dysfunction. 10 tablet prn  . simvastatin (ZOCOR) 20 MG tablet Take 20 mg by mouth daily. Patient takes 2 tablets every other day     No current facility-administered medications for this visit.    No Known Allergies  Review of Systems negative except from HPI and PMH  Physical Exam BP 140/76 mmHg  Pulse 59  Ht 5\' 11"  (1.803 m)  Wt 201 lb 3.2 oz (91.264 kg)  BMI 28.07 kg/m2 Well developed and well nourished in no acute distress HENT normal E scleral and icterus clear Neck Supple JVP flat; carotids brisk and full Clear to ausculation buriising on chest wallRegular rate and rhythm, no murmurs gallops or rub Soft with active bowel sounds No clubbing cyanosis  Edema Alert and oriented, grossly normal motor and sensory function Skin Warm and Dry  ECG demonstrates sinus rhythm    Assessment and  Plan  Ischemic cardiomyopathy  Ge reflux disease  Hypertension  ED   Doing relatively well. Blood pressure is sufficient that we will switch him from lisinopril to Entresto. We reviewed side effects. Will have him come back and see Dr. Gretta Arab in 3-4 weeks for potential up titration.  He has been intolerant of Aldactone.  As suggested he try over-the-counter PPI for GERD

## 2014-11-13 NOTE — Patient Instructions (Addendum)
Medication Instructions:  Your physician has recommended you make the following change in your medication:  1) STOP Lisinopril 2) START Entresto 24/26 mg twice a day -- DO NOT START THIS UNTIL 8/4. TAKE FIRST DOSE ON 8/4 IN THE EVENING.  Labwork: Medication surveillance lab today: BMET  Testing/Procedures: None ordered  Follow-Up: Your physician recommends that you schedule a follow-up appointment in: September with Dr. Meda Coffee.  Remote monitoring is used to monitor your Pacemaker of ICD from home. This monitoring reduces the number of office visits required to check your device to one time per year. It allows [place] to keep an eye on the functioning of your device to ensure it is working properly. You are scheduled for a device check from home on 02/12/15. You may send your transmission at any time that day. If you have a wireless device, the transmission will be sent automatically. After your physician reviews your transmission, you will receive a postcard with your next transmission date.  Your physician wants you to follow-up in: 1 year with Dr. [name]. You will receive a reminder letter in the mail two months in advance. If you don't receive a letter, please call our office to schedule the follow-up appointment.   Any Other Special Instructions Will Be Listed Below (If Applicable). Thank you for choosing [facility]!!

## 2014-11-14 ENCOUNTER — Telehealth: Payer: Self-pay

## 2014-11-14 NOTE — Telephone Encounter (Signed)
Prior auth for [employer] 24-26 sent to [employer] via Cover My Meds

## 2014-11-18 ENCOUNTER — Encounter (HOSPITAL_COMMUNITY): Payer: Self-pay | Admitting: *Deleted

## 2014-11-18 ENCOUNTER — Telehealth: Payer: Self-pay

## 2014-11-18 ENCOUNTER — Emergency Department (HOSPITAL_COMMUNITY): Payer: PRIVATE HEALTH INSURANCE

## 2014-11-18 ENCOUNTER — Emergency Department (HOSPITAL_COMMUNITY)
Admission: EM | Admit: 2014-11-18 | Discharge: 2014-11-18 | Disposition: A | Discharge status: Home / Self Care | Payer: PRIVATE HEALTH INSURANCE | Attending: Emergency Medicine | Admitting: Emergency Medicine

## 2014-11-18 DIAGNOSIS — Z79899 Other long term (current) drug therapy: Secondary | ICD-10-CM | POA: Insufficient documentation

## 2014-11-18 DIAGNOSIS — F410 Panic disorder [episodic paroxysmal anxiety] without agoraphobia: Secondary | ICD-10-CM | POA: Diagnosis not present

## 2014-11-18 DIAGNOSIS — I2510 Atherosclerotic heart disease of native coronary artery without angina pectoris: Secondary | ICD-10-CM | POA: Insufficient documentation

## 2014-11-18 DIAGNOSIS — Z95810 Presence of automatic (implantable) cardiac defibrillator: Secondary | ICD-10-CM | POA: Insufficient documentation

## 2014-11-18 DIAGNOSIS — Z8619 Personal history of other infectious and parasitic diseases: Secondary | ICD-10-CM | POA: Insufficient documentation

## 2014-11-18 DIAGNOSIS — Z720 Tobacco use: Secondary | ICD-10-CM | POA: Diagnosis not present

## 2014-11-18 DIAGNOSIS — I252 Old myocardial infarction: Secondary | ICD-10-CM | POA: Diagnosis not present

## 2014-11-18 DIAGNOSIS — Z7982 Long term (current) use of aspirin: Secondary | ICD-10-CM | POA: Diagnosis not present

## 2014-11-18 DIAGNOSIS — E785 Hyperlipidemia, unspecified: Secondary | ICD-10-CM | POA: Insufficient documentation

## 2014-11-18 DIAGNOSIS — I10 Essential (primary) hypertension: Secondary | ICD-10-CM | POA: Diagnosis not present

## 2014-11-18 DIAGNOSIS — R0602 Shortness of breath: Secondary | ICD-10-CM | POA: Insufficient documentation

## 2014-11-18 DIAGNOSIS — I509 Heart failure, unspecified: Secondary | ICD-10-CM | POA: Diagnosis not present

## 2014-11-18 LAB — BASIC METABOLIC PANEL
ANION GAP: 16 — AB (ref 5–15)
BUN: 11 mg/dL (ref 6–20)
CALCIUM: 9.5 mg/dL (ref 8.9–10.3)
CO2: 23 mmol/L (ref 22–32)
Chloride: 101 mmol/L (ref 101–111)
Creatinine, Ser: 1.19 mg/dL (ref 0.61–1.24)
GFR calc Af Amer: >60 mL/min (ref >60)
GFR calc non Af Amer: >60 mL/min (ref >60)
Glucose, Bld: 95 mg/dL (ref 65–99)
Potassium: 3.2 mmol/L — ABNORMAL LOW (ref 3.5–5.1)
Sodium: 140 mmol/L (ref 135–145)

## 2014-11-18 LAB — CBC
HEMATOCRIT: 43.4 % (ref 39.0–52.0)
Hemoglobin: 15 g/dL (ref 13.0–17.0)
MCH: 31.4 pg (ref 26.0–34.0)
MCHC: 34.6 g/dL (ref 30.0–36.0)
MCV: 91 fL (ref 78.0–100.0)
Platelets: 259 10*3/uL (ref 150–400)
RBC: 4.77 MIL/uL (ref 4.22–5.81)
RDW: 13.6 % (ref 11.5–15.5)
WBC: 9.7 10*3/uL (ref 4.0–10.5)

## 2014-11-18 LAB — I-STAT TROPONIN, ED
TROPONIN I, POC: 0.05 ng/mL (ref 0.00–0.08)
TROPONIN I, POC: 0.05 ng/mL (ref 0.00–0.08)

## 2014-11-18 MED ORDER — LORAZEPAM 2 MG/ML IJ SOLN: 1.0000 mg | Freq: Once | INTRAMUSCULAR | Status: DC

## 2014-11-18 MED ORDER — METRONIDAZOLE 500 MG PO TABS
2000.0000 mg | ORAL_TABLET | Freq: Once | ORAL | Status: AC
  Administered 2014-11-18: 2000 mg via ORAL
  Filled 2014-11-18: qty 4

## 2014-11-18 MED ORDER — NITROGLYCERIN 2 % TD OINT
1.0000 [in_us] | TOPICAL_OINTMENT | Freq: Once | TRANSDERMAL | Status: AC
  Administered 2014-11-18: 1 [in_us] via TOPICAL
  Filled 2014-11-18: qty 1

## 2014-11-18 MED ORDER — AZITHROMYCIN 250 MG PO TABS
1000.0000 mg | ORAL_TABLET | Freq: Once | ORAL | Status: AC
  Administered 2014-11-18: 1000 mg via ORAL
  Filled 2014-11-18: qty 4

## 2014-11-18 MED ORDER — CEFTRIAXONE SODIUM 250 MG IJ SOLR
250.0000 mg | INTRAMUSCULAR | Status: DC
  Administered 2014-11-18: 250 mg via INTRAMUSCULAR
  Filled 2014-11-18: qty 250

## 2014-11-18 MED ORDER — ASPIRIN 81 MG PO CHEW
324.0000 mg | CHEWABLE_TABLET | Freq: Once | ORAL | Status: AC
  Administered 2014-11-18: 324 mg via ORAL
  Filled 2014-11-18: qty 4

## 2014-11-18 MED ORDER — LORAZEPAM 1 MG PO TABS
1.0000 mg | ORAL_TABLET | Freq: Once | ORAL | Status: AC
  Administered 2014-11-18: 1 mg via ORAL
  Filled 2014-11-18: qty 1

## 2014-11-18 NOTE — Discharge Instructions (Signed)

## 2014-11-18 NOTE — Telephone Encounter (Signed)
Already sent a prior auth last week, 11/15/14. No response yet.

## 2014-11-18 NOTE — ED Notes (Addendum)
Patient presents stating he has numerous stressors at home and it "just finally got to me"  Upon arrival patient was hyperventilating and arms and fingers cramping.  Much encouragement needed to help him slow down his breathing.    Patient stated he spoke with his MD and was told to take his Lasix and did not need his BP med on Sunday.  Was to start taking Stann Mainland but the pharmacy told him it would not be in until Monday

## 2014-11-19 ENCOUNTER — Telehealth: Payer: Self-pay

## 2014-11-19 NOTE — ED Provider Notes (Signed)
CSN: [id number]     Arrival date & time 11/18/14  4098 History   First MD Initiated Contact with Patient 11/18/14 [phone]     Chief Complaint  Patient presents with  . Panic Attack     (Consider location/radiation/quality/duration/timing/severity/associated sxs/prior Treatment) HPI Comments: Patient got a call from a Mr. Sabella having trichomonas. Patient and begin to experience severe shortness of breath. Patient is married and is worried about telling his wife. He denies any penile discharge at this time. He states the anxiety got worse after yelling with his stepson.  Patient denies any chest pain or chest pressure.'s have extensive history of a CABG.  Patient is a 49 y.o. male presenting with shortness of breath. The history is provided by the patient.  Shortness of Breath Severity:  Moderate Onset quality:  Sudden Timing:  Constant Progression:  Unchanged Chronicity:  New Relieved by:  Nothing Worsened by:  Nothing tried Associated symptoms: no abdominal pain, no chest pain, no fever and no vomiting     Past Medical History  Diagnosis Date  . Coronary artery disease   . Hypertension   . Congestive heart failure   . Other and unspecified hyperlipidemia   . Anxiety state, unspecified   . MI (myocardial infarction) 03/2011  . STD (sexually transmitted disease)    Past Surgical History  Procedure Laterality Date  . Bypass graft  2011    3 vessel  . Ep implantable device  06/17/2014    DR [name]  . Implantable cardioverter defibrillator implant N/A 06/17/2014    Procedure: IMPLANTABLE CARDIOVERTER DEFIBRILLATOR IMPLANT;  Surgeon: Deboraha Sprang, MD;  Location: [facility] CATH LAB;  Service: Cardiovascular;  Laterality: N/A;   Family History  Problem Relation Age of Onset  . Hypertension Mother   . Diabetes Mother    History  Substance Use Topics  . Smoking status: Current Some Day Smoker -- 0.50 packs/day for 15 years    Types: Cigarettes  . Smokeless tobacco: Never Used  .  Alcohol Use: No     Comment: ocassionaly    Review of Systems  Constitutional: Negative for fever.  Respiratory: Positive for shortness of breath.   Cardiovascular: Negative for chest pain and leg swelling.  Gastrointestinal: Negative for vomiting and abdominal pain.  All other systems reviewed and are negative.     Allergies  Review of patient's allergies indicates no known allergies.  Home Medications   Prior to Admission medications   Medication Sig Start Date End Date Taking? Authorizing Provider  aspirin 81 MG tablet Take 81 mg by mouth daily.    Historical Provider, MD  carvedilol (COREG) 25 MG tablet Take 1 tablet (25 mg total) by mouth 2 (two) times daily with a meal. 06/14/14   Jerline Pain, MD  furosemide (LASIX) 40 MG tablet Take 1 tablet (40 mg total) by mouth 2 (two) times daily. 06/14/14   Jerline Pain, MD  hydrALAZINE (APRESOLINE) 50 MG tablet Take 50 mg by mouth 2 (two) times daily.    Historical Provider, MD  nitroGLYCERIN (NITROSTAT) 0.4 MG SL tablet Place 1 tablet (0.4 mg total) under the tongue every 5 (five) minutes as needed for chest pain. 06/14/14   Jerline Pain, MD  sacubitril-valsartan (ENTRESTO) 24-26 MG Take 1 tablet by mouth 2 (two) times daily. 11/13/14   Deboraha Sprang, MD  sacubitril-valsartan (ENTRESTO) 24-26 MG Take 1 tablet by mouth 2 (two) times daily. 11/13/14   Deboraha Sprang, MD  sildenafil (VIAGRA) 100  MG tablet Take 1 tablet (100 mg total) by mouth daily as needed for erectile dysfunction. 09/30/14   Jerline Pain, MD  simvastatin (ZOCOR) 20 MG tablet Take 20 mg by mouth daily. Patient takes 2 tablets every other day    Historical Provider, MD   BP 177/90 mmHg  Pulse 78  Temp(Src) 98.5 F (36.9 C) (Oral)  Resp 22  SpO2 95% Physical Exam  Constitutional: He is oriented to person, place, and time. He appears well-developed and well-nourished. No distress.  HENT:  Head: Normocephalic and atraumatic.  Mouth/Throat: Oropharynx is clear and moist.  No oropharyngeal exudate.  Eyes: EOM are normal. Pupils are equal, round, and reactive to light.  Neck: Normal range of motion. Neck supple.  Cardiovascular: Normal rate and regular rhythm.  Exam reveals no friction rub.   No murmur heard. Pulmonary/Chest: Effort normal and breath sounds normal. No respiratory distress. He has no wheezes. He has no rales.  Abdominal: Soft. He exhibits no distension. There is no tenderness. There is no rebound.  Musculoskeletal: Normal range of motion. He exhibits no edema.  Neurological: He is alert and oriented to person, place, and time. No cranial nerve deficit. He exhibits normal muscle tone. Coordination normal.  Skin: No rash noted. He is not diaphoretic.  Psychiatric: His mood appears anxious.  Nursing note and vitals reviewed.   ED Course  Procedures (including critical care time) Labs Review Labs Reviewed  BASIC METABOLIC PANEL - Abnormal; Notable for the following:    Potassium 3.2 (*)    Anion gap 16 (*)    All other components within normal limits  CBC  I-STAT TROPOININ, ED  I-STAT TROPOININ, ED    Imaging Review Dg Chest 2 View  11/18/2014   CLINICAL DATA:  Acute onset of generalized weakness, blurred vision, and numbness and slight shortness of breath. Initial encounter.  EXAM: CHEST  2 VIEW  COMPARISON:  Chest radiograph performed 06/18/2014  FINDINGS: The lungs are well-aerated. Vascular congestion is noted, with mildly increased interstitial markings, concerning for mild interstitial edema. There is no evidence of pleural effusion or pneumothorax.  The heart is borderline enlarged. The patient is status post median sternotomy. An AICD is noted at the left chest wall, with a single lead ending at the right ventricle. No acute osseous abnormalities are seen.  IMPRESSION: Vascular congestion and borderline cardiomegaly, with mildly increased interstitial markings, concerning for mild interstitial edema.   Electronically Signed   By: Garald Balding M.D.   On: 11/18/2014 04:48     EKG Interpretation   Date/Time:  Monday November 18 2014 03:38:48 EDT Ventricular Rate:  98 PR Interval:  169 QRS Duration: 122 QT Interval:  499 QTC Calculation: 637 R Axis:   94 Text Interpretation:  Sinus rhythm Right atrial enlargement Probable LVH  with secondary repol abnrm ST elevation, consider inferior injury  Prolonged QT interval Artifact in lead(s) I II III aVR aVL aVF V1 V2 V3 V4  V5 V6 and baseline wander in lead(s) II III aVF Confirmed by Mingo Amber  MD,  Sheldon Amara ([id number]) on 11/18/2014 4:14:41 AM      MDM   Final diagnoses:  Panic attack    49 year old male here with panic attack symptoms. With his extensive history, serial troponins checked and are negative. He is well appearing and stable for discharge. He did get treatment for trichomonas and was covered for GC and chlamydia.  Evelina Bucy, MD 11/19/14 [phone]

## 2014-11-19 NOTE — Telephone Encounter (Signed)
Entresto approved by [employer], good through 11/19/2015.

## 2014-11-20 ENCOUNTER — Encounter: Payer: Self-pay | Admitting: Internal Medicine

## 2014-11-27 ENCOUNTER — Encounter: Payer: Self-pay | Admitting: Cardiology

## 2014-11-27 ENCOUNTER — Ambulatory Visit (INDEPENDENT_AMBULATORY_CARE_PROVIDER_SITE_OTHER): Payer: PRIVATE HEALTH INSURANCE | Admitting: Cardiology

## 2014-11-27 ENCOUNTER — Telehealth: Payer: Self-pay | Admitting: Internal Medicine

## 2014-11-27 VITALS — BP 138/76 | HR 63 | Ht 71.0 in | Wt 200.8 lb

## 2014-11-27 DIAGNOSIS — E785 Hyperlipidemia, unspecified: Secondary | ICD-10-CM

## 2014-11-27 DIAGNOSIS — I10 Essential (primary) hypertension: Secondary | ICD-10-CM

## 2014-11-27 DIAGNOSIS — I5022 Chronic systolic (congestive) heart failure: Secondary | ICD-10-CM

## 2014-11-27 DIAGNOSIS — I2510 Atherosclerotic heart disease of native coronary artery without angina pectoris: Secondary | ICD-10-CM

## 2014-11-27 NOTE — Telephone Encounter (Signed)
New message     Calling in regards to prior authorization for Entresto Please call to discuss

## 2014-11-27 NOTE — Patient Instructions (Signed)
Medication Instructions:  Your physician recommends that you continue on your current medications as directed. Please refer to the Current Medication list given to you today.  Follow-Up: Follow up in 4 months with Dr Skains.  Thank you for choosing [facility]!!     

## 2014-11-27 NOTE — Progress Notes (Signed)
[facility]. [address] [place], [place]  [address] Phone: [phone] Fax:  [phone]  Date:  11/27/2014   ID:  Roger Martinez, DOB 01-05-1966, MRN [id number]  PCP:  No primary care provider on file.   History of Present Illness: Roger Martinez is a 49 y.o. male with prior history of three-vessel bypass in 2011, coronary artery disease, hypertension here for follow-up.ICD placed. Dr. Lovena Le patient.  Patient previously left AMA from hospital on 04/07/13 with new diagnosis of systolic heart failure with ejection fraction of 30-35%. His EF has been repeated and was 20/25 percent. Because of this, defibrillator was placed. He was quite thankful on today's visit. He seems to have turned the corner with his medical compliance. Excellent.  He was a Administrator. Bruise left of sternum. Basketball.  2 miles a day walking. Feels good. No shortness of breath, no chest pain.  11/27/14 - Not taking Entesto but every 2-3 days. Same with other heart failure medications. Anxiety medication does not agree with him. Held lasix for several days. Every 3 days he is taking coreg. Feels anxious if takes daily. He thinks he has been eating more. He feels well however, no shortness of breath   Wt Readings from Last 3 Encounters:  11/27/14 200 lb 12.8 oz (91.082 kg)  11/13/14 201 lb 3.2 oz (91.264 kg)  10/15/14 194 lb (87.998 kg)     Past Medical History  Diagnosis Date  . Coronary artery disease   . Hypertension   . Congestive heart failure   . Other and unspecified hyperlipidemia   . Anxiety state, unspecified   . MI (myocardial infarction) 03/2011  . STD (sexually transmitted disease)     Past Surgical History  Procedure Laterality Date  . Bypass graft  2011    3 vessel  . Ep implantable device  06/17/2014    DR [name]  . Implantable cardioverter defibrillator implant N/A 06/17/2014    Procedure: IMPLANTABLE CARDIOVERTER DEFIBRILLATOR IMPLANT;  Surgeon: Deboraha Sprang, MD;  Location:  [facility] CATH LAB;  Service: Cardiovascular;  Laterality: N/A;    Current Outpatient Prescriptions  Medication Sig Dispense Refill  . aspirin 81 MG tablet Take 81 mg by mouth daily.    . carvedilol (COREG) 25 MG tablet Take 1 tablet (25 mg total) by mouth 2 (two) times daily with a meal. 60 tablet 3  . furosemide (LASIX) 40 MG tablet Take 1 tablet (40 mg total) by mouth 2 (two) times daily. 60 tablet 3  . hydrALAZINE (APRESOLINE) 50 MG tablet Take 50 mg by mouth 2 (two) times daily.    . nitroGLYCERIN (NITROSTAT) 0.4 MG SL tablet Place 1 tablet (0.4 mg total) under the tongue every 5 (five) minutes as needed for chest pain. 25 tablet 3  . sacubitril-valsartan (ENTRESTO) 24-26 MG Take 1 tablet by mouth 2 (two) times daily. 60 tablet 0  . sildenafil (VIAGRA) 100 MG tablet Take 1 tablet (100 mg total) by mouth daily as needed for erectile dysfunction. 10 tablet prn  . simvastatin (ZOCOR) 20 MG tablet Take 20 mg by mouth daily. Patient takes 2 tablets every other day     No current facility-administered medications for this visit.    Allergies:   No Known Allergies  Social History:  The patient  reports that he has been smoking Cigarettes.  He has a 7.5 pack-year smoking history. He has never used smokeless tobacco. 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 Hypertension Mother   . Diabetes Mother     ROS:  Please see the history of present illness.   Denies any syncope, bleeding, positive orthopnea, positive PND, no fevers, chills, rash   All other systems reviewed and negative.   PHYSICAL EXAM: VS:  BP 138/76 mmHg  Pulse 63  Ht 5\' 11"  (1.803 m)  Wt 200 lb 12.8 oz (91.082 kg)  BMI 28.02 kg/m2  SpO2 97% Well nourished, well developed, in no acute distress HEENT: normal, [place]/AT, EOMI Ext: no edema Skin: warm and dry, mild bruising left of sternum.  GU: deferred Neuro: no focal abnormalities noted, AAO x  3  EKG:  03/20/14-sinus bradycardia rate 56, T-wave inversion in precordial/inferior leads-no change from prior 05/08/13 :Sinus rhythm rate 80 with T-wave inversion through precordial leads as well as inferior leads, possible ischemia. No significant change from prior.  Lab work, hospital records extensively reviewed. 3/16-creatinine 1.02, troponin normal, BNP 4500, LDL 118, hemoglobin 15.3  Echocardiogram:   04/06/14 - Left ventricle: The cavity size was normal. Wall thickness was increased in a pattern of severe LVH. Systolic function was moderately to severely reduced. The estimated ejection fraction was in the range of 20-25%. Diffuse hypokinesis. - Aortic valve: Mild regurgitation. - Mitral valve: Mild regurgitation. - Left atrium: The atrium was moderately dilated. - Right atrium: The atrium was mildly dilated. - Pulmonary arteries: PA peak pressure: 83mm Hg (S).  ASSESSMENT AND PLAN:  1. Cardiomyopathy/chronic systolic heart failure-EF 25%-30 on repeat ECHO.  Admitted December 2014 with acute systolic heart failure. Previously noncompliance. Truck driver. Defibrillator in place. Teary-eyed at times in the past.. Low-salt diet. Fluid restriction. Thankfully, he feels better. NYHA 1-2. He was started on Entresto by Dr. [name]. Unfortunately, he is still demonstrating issues with taking his medications. Thankfully he is told me that he is taking him every 3 days. Try to encourage compliance however this is been an ongoing issue. 2. Coronary artery disease-post bypass/CABG in [facility]. 3. Essential hypertension- Continue to encourage medication use. 4. Erectile dysfunction-discussed with Dr. [name] as well. We would be fine with him being on agent. We will go ahead and give him Viagra 100mg . He may cut those if needed.  5. Hyperlipidemia-taking simvastatin 20 mg every other day. As he is having myalgias. 6. 39-month follow-up  Signed, Candee Furbish, MD [facility]  11/27/2014 9:22  AM

## 2014-12-10 ENCOUNTER — Emergency Department (HOSPITAL_COMMUNITY)
Admission: EM | Admit: 2014-12-10 | Discharge: 2014-12-10 | Disposition: A | Discharge status: Home / Self Care | Payer: PRIVATE HEALTH INSURANCE | Attending: Emergency Medicine | Admitting: Emergency Medicine

## 2014-12-10 ENCOUNTER — Encounter (HOSPITAL_COMMUNITY): Payer: Self-pay | Admitting: Emergency Medicine

## 2014-12-10 ENCOUNTER — Emergency Department (HOSPITAL_COMMUNITY): Payer: PRIVATE HEALTH INSURANCE

## 2014-12-10 DIAGNOSIS — R0602 Shortness of breath: Secondary | ICD-10-CM | POA: Insufficient documentation

## 2014-12-10 DIAGNOSIS — I10 Essential (primary) hypertension: Secondary | ICD-10-CM | POA: Diagnosis not present

## 2014-12-10 DIAGNOSIS — Z720 Tobacco use: Secondary | ICD-10-CM | POA: Diagnosis not present

## 2014-12-10 DIAGNOSIS — I252 Old myocardial infarction: Secondary | ICD-10-CM | POA: Diagnosis not present

## 2014-12-10 DIAGNOSIS — Z8619 Personal history of other infectious and parasitic diseases: Secondary | ICD-10-CM | POA: Insufficient documentation

## 2014-12-10 DIAGNOSIS — E785 Hyperlipidemia, unspecified: Secondary | ICD-10-CM | POA: Insufficient documentation

## 2014-12-10 DIAGNOSIS — Z950 Presence of cardiac pacemaker: Secondary | ICD-10-CM | POA: Diagnosis not present

## 2014-12-10 DIAGNOSIS — R079 Chest pain, unspecified: Secondary | ICD-10-CM | POA: Diagnosis present

## 2014-12-10 DIAGNOSIS — F419 Anxiety disorder, unspecified: Secondary | ICD-10-CM | POA: Insufficient documentation

## 2014-12-10 DIAGNOSIS — Z7982 Long term (current) use of aspirin: Secondary | ICD-10-CM | POA: Diagnosis not present

## 2014-12-10 DIAGNOSIS — I2510 Atherosclerotic heart disease of native coronary artery without angina pectoris: Secondary | ICD-10-CM | POA: Insufficient documentation

## 2014-12-10 DIAGNOSIS — Z79899 Other long term (current) drug therapy: Secondary | ICD-10-CM | POA: Insufficient documentation

## 2014-12-10 LAB — I-STAT TROPONIN, ED
TROPONIN I, POC: 0.03 ng/mL (ref 0.00–0.08)
Troponin i, poc: 0.02 ng/mL (ref 0.00–0.08)
Troponin i, poc: 0.03 ng/mL (ref 0.00–0.08)

## 2014-12-10 LAB — CBC
HEMATOCRIT: 43 % (ref 39.0–52.0)
HEMOGLOBIN: 14.8 g/dL (ref 13.0–17.0)
MCH: 31.7 pg (ref 26.0–34.0)
MCHC: 34.4 g/dL (ref 30.0–36.0)
MCV: 92.1 fL (ref 78.0–100.0)
Platelets: 232 10*3/uL (ref 150–400)
RBC: 4.67 MIL/uL (ref 4.22–5.81)
RDW: 13.4 % (ref 11.5–15.5)
WBC: 7.6 10*3/uL (ref 4.0–10.5)

## 2014-12-10 LAB — BASIC METABOLIC PANEL
Anion gap: 8 (ref 5–15)
BUN: 15 mg/dL (ref 6–20)
CHLORIDE: 106 mmol/L (ref 101–111)
CO2: 23 mmol/L (ref 22–32)
Calcium: 9.2 mg/dL (ref 8.9–10.3)
Creatinine, Ser: 1.22 mg/dL (ref 0.61–1.24)
GFR calc Af Amer: >60 mL/min (ref >60)
GFR calc non Af Amer: >60 mL/min (ref >60)
GLUCOSE: 96 mg/dL (ref 65–99)
POTASSIUM: 3.5 mmol/L (ref 3.5–5.1)
Sodium: 137 mmol/L (ref 135–145)

## 2014-12-10 MED ORDER — LORAZEPAM 1 MG PO TABS
1.0000 mg | ORAL_TABLET | Freq: Once | ORAL | Status: AC
Start: 2014-12-10 — End: 2014-12-10
  Administered 2014-12-10: 1 mg via ORAL
  Filled 2014-12-10: qty 1

## 2014-12-10 MED ORDER — CARVEDILOL 25 MG PO TABS
25.0000 mg | ORAL_TABLET | Freq: Once | ORAL | Status: AC
  Administered 2014-12-10: 25 mg via ORAL
  Filled 2014-12-10: qty 1

## 2014-12-10 MED ORDER — HYDRALAZINE HCL 50 MG PO TABS
50.0000 mg | ORAL_TABLET | Freq: Once | ORAL | Status: AC
  Administered 2014-12-10: 50 mg via ORAL
  Filled 2014-12-10: qty 1

## 2014-12-10 MED ORDER — SACUBITRIL-VALSARTAN 24-26 MG PO TABS
1.0000 | ORAL_TABLET | Freq: Once | ORAL | Status: AC
  Administered 2014-12-10: 1 via ORAL
  Filled 2014-12-10: qty 1

## 2014-12-10 MED ORDER — LORAZEPAM 2 MG/ML IJ SOLN
1.0000 mg | Freq: Once | INTRAMUSCULAR | Status: AC
  Administered 2014-12-10: 1 mg via INTRAVENOUS
  Filled 2014-12-10: qty 1

## 2014-12-10 MED ORDER — FUROSEMIDE 20 MG PO TABS
40.0000 mg | ORAL_TABLET | Freq: Once | ORAL | Status: AC
  Administered 2014-12-10: 40 mg via ORAL
  Filled 2014-12-10: qty 2

## 2014-12-10 NOTE — ED Provider Notes (Signed)
CSN: [id number]     Arrival date & time 12/10/14  0207 History  This chart was scribed for Delora Fuel, MD by Randa Evens, ED Scribe. This patient was seen in room A06C/A06C and the patient's care was started at 2:21 AM.      Chief Complaint  Patient presents with  . Chest Pain   The history is provided by the patient. No language interpreter was used.   HPI Comments: Roger Martinez is a 49 y.o. male wit PMHx listed below who presents to the Emergency Department complaining of resolved CP onset 1 night prior at 7:50PM after eating a sandwich with hot sauce. Pt describes the pain in his chest as pressure. Pt states that he had associated SOB and diaphoresis. Pt states that he took nitroglycerin and aspirin that provided relief. Denies nausea or vomiting. Pt states that he is also feeling like his symptoms are related to anxiety.   Past Medical History  Diagnosis Date  . Coronary artery disease   . Hypertension   . Congestive heart failure   . Other and unspecified hyperlipidemia   . Anxiety state, unspecified   . MI (myocardial infarction) 03/2011  . STD (sexually transmitted disease)    Past Surgical History  Procedure Laterality Date  . Bypass graft  2011    3 vessel  . Ep implantable device  06/17/2014    DR [name]  . Implantable cardioverter defibrillator implant N/A 06/17/2014    Procedure: IMPLANTABLE CARDIOVERTER DEFIBRILLATOR IMPLANT;  Surgeon: Deboraha Sprang, MD;  Location: [facility] CATH LAB;  Service: Cardiovascular;  Laterality: N/A;   Family History  Problem Relation Age of Onset  . Hypertension Mother   . Diabetes Mother    Social History  Substance Use Topics  . Smoking status: Current Every Day Smoker -- 0.00 packs/day for 0 years    Types: Cigarettes  . Smokeless tobacco: Never Used  . Alcohol Use: No     Comment: ocassionaly    Review of Systems  Constitutional: Positive for diaphoresis.  Respiratory: Positive for shortness of breath.   Cardiovascular:  Positive for chest pain.  Gastrointestinal: Negative for nausea and vomiting.  All other systems reviewed and are negative.    Allergies  Review of patient's allergies indicates no known allergies.  Home Medications   Prior to Admission medications   Medication Sig Start Date End Date Taking? Authorizing Provider  aspirin 81 MG tablet Take 81 mg by mouth daily.   Yes Historical Provider, MD  carvedilol (COREG) 25 MG tablet Take 1 tablet (25 mg total) by mouth 2 (two) times daily with a meal. 06/14/14  Yes Jerline Pain, MD  furosemide (LASIX) 40 MG tablet Take 1 tablet (40 mg total) by mouth 2 (two) times daily. 06/14/14  Yes Jerline Pain, MD  hydrALAZINE (APRESOLINE) 50 MG tablet Take 50 mg by mouth 2 (two) times daily.   Yes Historical Provider, MD  nitroGLYCERIN (NITROSTAT) 0.4 MG SL tablet Place 1 tablet (0.4 mg total) under the tongue every 5 (five) minutes as needed for chest pain. 06/14/14  Yes Jerline Pain, MD  sacubitril-valsartan (ENTRESTO) 24-26 MG Take 1 tablet by mouth 2 (two) times daily. 11/13/14  Yes Deboraha Sprang, MD  sildenafil (VIAGRA) 100 MG tablet Take 1 tablet (100 mg total) by mouth daily as needed for erectile dysfunction. 09/30/14  Yes Jerline Pain, MD  simvastatin (ZOCOR) 20 MG tablet Take 20 mg by mouth daily. Patient takes 2 tablets every other  day   Yes Historical Provider, MD   BP 188/117 mmHg  Pulse 77  Temp(Src) 98 F (36.7 C) (Oral)  Resp 20  SpO2 96%   Physical Exam  Constitutional: He is oriented to person, place, and time. He appears well-developed and well-nourished. No distress.  HENT:  Head: Normocephalic and atraumatic.  Eyes: EOM are normal. Pupils are equal, round, and reactive to light.  Neck: Normal range of motion. Neck supple. No JVD present.  Cardiovascular: Normal rate, regular rhythm and normal heart sounds.   No murmur heard. Pulmonary/Chest: Effort normal and breath sounds normal. He has no wheezes. He has no rales. He exhibits no  tenderness.  Pacemaker present in left subclavian area.   Abdominal: Soft. Bowel sounds are normal. He exhibits no distension and no mass. There is no tenderness.  Musculoskeletal: Normal range of motion. He exhibits no edema.  Lymphadenopathy:    He has no cervical adenopathy.  Neurological: He is alert and oriented to person, place, and time. No cranial nerve deficit. He exhibits normal muscle tone. Coordination normal.  Skin: Skin is warm and dry. No rash noted.  Psychiatric: His behavior is normal. Thought content normal.  He is very anxious.  Nursing note and vitals reviewed.   ED Course  Procedures (including critical care time) DIAGNOSTIC STUDIES: Oxygen Saturation is 96% on RA, adequate by my interpretation.    COORDINATION OF CARE: 3:06 AM-Discussed treatment plan with pt at bedside and pt agreed to plan.     Labs Review Results for orders placed or performed during the hospital encounter of 17/00/17  Basic metabolic panel  Result Value Ref Range   Sodium 137 135 - 145 mmol/L   Potassium 3.5 3.5 - 5.1 mmol/L   Chloride 106 101 - 111 mmol/L   CO2 23 22 - 32 mmol/L   Glucose, Bld 96 65 - 99 mg/dL   BUN 15 6 - 20 mg/dL   Creatinine, Ser 1.22 0.61 - 1.24 mg/dL   Calcium 9.2 8.9 - 10.3 mg/dL   GFR calc non Af Amer >60 >60 mL/min   GFR calc Af Amer >60 >60 mL/min   Anion gap 8 5 - 15  CBC  Result Value Ref Range   WBC 7.6 4.0 - 10.5 K/uL   RBC 4.67 4.22 - 5.81 MIL/uL   Hemoglobin 14.8 13.0 - 17.0 g/dL   HCT 43.0 39.0 - 52.0 %   MCV 92.1 78.0 - 100.0 fL   MCH 31.7 26.0 - 34.0 pg   MCHC 34.4 30.0 - 36.0 g/dL   RDW 13.4 11.5 - 15.5 %   Platelets 232 150 - 400 K/uL  I-Stat Troponin, ED (not at [facility])  Result Value Ref Range   Troponin i, poc 0.02 0.00 - 0.08 ng/mL   Comment 3          I-stat troponin, ED  Result Value Ref Range   Troponin i, poc 0.03 0.00 - 0.08 ng/mL   Comment 3           Dg Chest 2 View  12/10/2014   CLINICAL DATA:  Chest pain, dyspnea and  diaphoresis.  Onset tonight.  EXAM: CHEST  2 VIEW  COMPARISON:  11/18/2014  FINDINGS: There is unchanged moderate cardiomegaly. There is prior sternotomy and CABG. There are intact appearances of the transvenous lead. There is a trace pleural fluid in the fissures. There is mild central ground-glass opacity suggesting alveolar edema. No dense consolidation is evident.  IMPRESSION: Mild central alveolar  edema. Trace pleural fluid. Unchanged cardiomegaly.   Electronically Signed   By: Andreas Newport M.D.   On: 12/10/2014 02:34   Dg Chest 2 View  11/18/2014   CLINICAL DATA:  Acute onset of generalized weakness, blurred vision, and numbness and slight shortness of breath. Initial encounter.  EXAM: CHEST  2 VIEW  COMPARISON:  Chest radiograph performed 06/18/2014  FINDINGS: The lungs are well-aerated. Vascular congestion is noted, with mildly increased interstitial markings, concerning for mild interstitial edema. There is no evidence of pleural effusion or pneumothorax.  The heart is borderline enlarged. The patient is status post median sternotomy. An AICD is noted at the left chest wall, with a single lead ending at the right ventricle. No acute osseous abnormalities are seen.  IMPRESSION: Vascular congestion and borderline cardiomegaly, with mildly increased interstitial markings, concerning for mild interstitial edema.   Electronically Signed   By: Garald Balding M.D.   On: 11/18/2014 04:48     Imaging Review Dg Chest 2 View  12/10/2014   CLINICAL DATA:  Chest pain, dyspnea and diaphoresis.  Onset tonight.  EXAM: CHEST  2 VIEW  COMPARISON:  11/18/2014  FINDINGS: There is unchanged moderate cardiomegaly. There is prior sternotomy and CABG. There are intact appearances of the transvenous lead. There is a trace pleural fluid in the fissures. There is mild central ground-glass opacity suggesting alveolar edema. No dense consolidation is evident.  IMPRESSION: Mild central alveolar edema. Trace pleural fluid.  Unchanged cardiomegaly.   Electronically Signed   By: Andreas Newport M.D.   On: 12/10/2014 02:34   I have personally reviewed and evaluated these images and lab results as part of my medical decision-making.   EKG Interpretation   Date/Time:  Tuesday December 10 2014 02:12:22 EDT Ventricular Rate:  76 PR Interval:  188 QRS Duration: 120 QT Interval:  448 QTC Calculation: 504 R Axis:   105 Text Interpretation:  Normal sinus rhythm Biatrial enlargement Rightward  axis Pulmonary disease pattern Non-specific intra-ventricular conduction  delay ST \\T \ T wave abnormality, consider inferolateral ischemia Abnormal  ECG When compared with ECG of 8/8/20116, T wave inversion is more  prominent When compared with ECG of 06/18/2014, T wave inversion is less  prominent Confirmed by [facility]  MD, Cypress Hinkson ([id number]) on 12/10/2014 2:21:06 AM      MDM   Final diagnoses:  Chest pain, unspecified chest pain type  Essential hypertension      Chest pain of uncertain cause. Review of old records shows that he had three-vessel coronary artery bypass in 2011 so he does have known cardiac disease. He has also been noted to have a cardiomyopathy with low ejection fraction so he has an implanted pacemaker defibrillator. Chest pain was relieved with nitroglycerin on 2 occasions. This is consistent with cardiac pain but also consistent with esophageal spasm. ECG is unchanged from prior tracings and initial troponin is negative. It is felt prudent to observe him to get a repeat troponin.  Second troponin is come back slightly higher than first. We'll keep in the ED for a third troponin. Of note, patient has had extreme anxiety and has been given lorazepam. Case is signed out to Dr. Laneta Simmers to evaluate third troponin.   I personally performed the services described in this documentation, which was scribed in my presence. The recorded information has been reviewed and is accurate.       Delora Fuel, MD 54/00/86  [id number]

## 2014-12-10 NOTE — ED Notes (Signed)
Placed pt on 2L of O2 due to low O2 stats (89 - 88) MD notified.  O2 level returned to acceptable levels(96%)

## 2014-12-10 NOTE — ED Notes (Signed)
Wife at bedside with patient.

## 2014-12-10 NOTE — Discharge Instructions (Signed)

## 2014-12-10 NOTE — ED Provider Notes (Signed)
Handoff received from Dr. Roxanne Mins at 1:85U with plan for third troponin for r/o ACS following atypical presentation and no EKG changes, pain resolved.   Results:  BP 184/127 mmHg  Pulse 80  Temp(Src) 98 F (36.7 C) (Oral)  Resp 24  SpO2 98%  Results for orders placed or performed during the hospital encounter of 31/49/70  Basic metabolic panel  Result Value Ref Range   Sodium 137 135 - 145 mmol/L   Potassium 3.5 3.5 - 5.1 mmol/L   Chloride 106 101 - 111 mmol/L   CO2 23 22 - 32 mmol/L   Glucose, Bld 96 65 - 99 mg/dL   BUN 15 6 - 20 mg/dL   Creatinine, Ser 1.22 0.61 - 1.24 mg/dL   Calcium 9.2 8.9 - 10.3 mg/dL   GFR calc non Af Amer >60 >60 mL/min   GFR calc Af Amer >60 >60 mL/min   Anion gap 8 5 - 15  CBC  Result Value Ref Range   WBC 7.6 4.0 - 10.5 K/uL   RBC 4.67 4.22 - 5.81 MIL/uL   Hemoglobin 14.8 13.0 - 17.0 g/dL   HCT 43.0 39.0 - 52.0 %   MCV 92.1 78.0 - 100.0 fL   MCH 31.7 26.0 - 34.0 pg   MCHC 34.4 30.0 - 36.0 g/dL   RDW 13.4 11.5 - 15.5 %   Platelets 232 150 - 400 K/uL  I-Stat Troponin, ED (not at [facility])  Result Value Ref Range   Troponin i, poc 0.02 0.00 - 0.08 ng/mL   Comment 3          I-stat troponin, ED  Result Value Ref Range   Troponin i, poc 0.03 0.00 - 0.08 ng/mL   Comment 3          I-Stat Troponin, ED (not at [facility])  Result Value Ref Range   Troponin i, poc 0.03 0.00 - 0.08 ng/mL   Comment 3            Diagnoses that have been ruled out:  None  Diagnoses that are still under consideration:  None  Final diagnoses:  Chest pain, unspecified chest pain type  Essential hypertension     Plan to follow up with PCP as needed and return precautions discussed for worsening or new concerning symptoms.    Leo Grosser, MD 12/10/14 [phone]

## 2014-12-10 NOTE — ED Notes (Signed)
MD aware of BP, home meds ordered, waiting to come from pharmacy.

## 2014-12-10 NOTE — ED Notes (Addendum)
Pt. reports central chest tightness with SOB , productive cough and diaphoresis onset this evening , denies nausea , pt. took 1 NTG sl prior to arrival with relief , history of CAD/CHF/CABG his cardiologist is Dr. Arlyce Dice.

## 2014-12-10 NOTE — ED Notes (Signed)
MD at bedside. 

## 2014-12-10 NOTE — ED Notes (Signed)
MD Laneta Simmers aware of BP, medications given, ok for discharge per MD.

## 2014-12-10 NOTE — ED Notes (Signed)
Notified MD of blood pressure and the fact that the pt states he continues to have "a panic attack."  Medication ordered

## 2015-01-09 ENCOUNTER — Ambulatory Visit: Payer: PRIVATE HEALTH INSURANCE | Admitting: Cardiology

## 2015-01-14 ENCOUNTER — Telehealth: Payer: Self-pay | Admitting: Cardiology

## 2015-01-14 NOTE — Telephone Encounter (Signed)
Pt states hit his knee Saturday night and on a scale of 1 to 10 his pain level is an "11".  Pt states there is no swelling, etc.   He states he was told not to take Aleve but this seems to be the only thing that helps his pain.  Advised pt Aleve can decrease the effectiveness of some of his medications and he should avoid it.  Advised if he is having that much pain he should be evaluated by UrgentCare since he is out of town.  He states he will go and have it evaluated.

## 2015-01-14 NOTE — Telephone Encounter (Signed)
New Message  Pt c/o of pain in his knee- pt stated he took Aleve for the pain and remembered that he was told not to take this for pain. Pt wanted to know what he could take. Please call back and discuss.

## 2015-02-05 ENCOUNTER — Encounter (HOSPITAL_COMMUNITY): Payer: Self-pay | Admitting: General Practice

## 2015-02-05 ENCOUNTER — Emergency Department (HOSPITAL_COMMUNITY): Payer: PRIVATE HEALTH INSURANCE

## 2015-02-05 ENCOUNTER — Emergency Department (HOSPITAL_COMMUNITY)
Admission: EM | Admit: 2015-02-05 | Discharge: 2015-02-05 | Discharge status: Against Medical Advice | Payer: PRIVATE HEALTH INSURANCE | Attending: Emergency Medicine | Admitting: Emergency Medicine

## 2015-02-05 DIAGNOSIS — Z951 Presence of aortocoronary bypass graft: Secondary | ICD-10-CM | POA: Insufficient documentation

## 2015-02-05 DIAGNOSIS — Z7982 Long term (current) use of aspirin: Secondary | ICD-10-CM | POA: Diagnosis not present

## 2015-02-05 DIAGNOSIS — I10 Essential (primary) hypertension: Secondary | ICD-10-CM | POA: Diagnosis not present

## 2015-02-05 DIAGNOSIS — Z79899 Other long term (current) drug therapy: Secondary | ICD-10-CM | POA: Diagnosis not present

## 2015-02-05 DIAGNOSIS — I2510 Atherosclerotic heart disease of native coronary artery without angina pectoris: Secondary | ICD-10-CM | POA: Diagnosis not present

## 2015-02-05 DIAGNOSIS — Z720 Tobacco use: Secondary | ICD-10-CM | POA: Diagnosis not present

## 2015-02-05 DIAGNOSIS — Z95810 Presence of automatic (implantable) cardiac defibrillator: Secondary | ICD-10-CM | POA: Diagnosis not present

## 2015-02-05 DIAGNOSIS — I252 Old myocardial infarction: Secondary | ICD-10-CM | POA: Insufficient documentation

## 2015-02-05 DIAGNOSIS — Z8619 Personal history of other infectious and parasitic diseases: Secondary | ICD-10-CM | POA: Insufficient documentation

## 2015-02-05 DIAGNOSIS — I509 Heart failure, unspecified: Secondary | ICD-10-CM | POA: Insufficient documentation

## 2015-02-05 DIAGNOSIS — R079 Chest pain, unspecified: Secondary | ICD-10-CM | POA: Diagnosis not present

## 2015-02-05 DIAGNOSIS — Z8659 Personal history of other mental and behavioral disorders: Secondary | ICD-10-CM | POA: Insufficient documentation

## 2015-02-05 DIAGNOSIS — E785 Hyperlipidemia, unspecified: Secondary | ICD-10-CM | POA: Diagnosis not present

## 2015-02-05 DIAGNOSIS — R0789 Other chest pain: Secondary | ICD-10-CM | POA: Diagnosis present

## 2015-02-05 LAB — I-STAT TROPONIN, ED: TROPONIN I, POC: 0.03 ng/mL (ref 0.00–0.08)

## 2015-02-05 LAB — BASIC METABOLIC PANEL
Anion gap: 10 (ref 5–15)
BUN: 13 mg/dL (ref 6–20)
CHLORIDE: 106 mmol/L (ref 101–111)
CO2: 24 mmol/L (ref 22–32)
CREATININE: 1.1 mg/dL (ref 0.61–1.24)
Calcium: 9.7 mg/dL (ref 8.9–10.3)
GFR calc Af Amer: >60 mL/min (ref >60)
GFR calc non Af Amer: >60 mL/min (ref >60)
Glucose, Bld: 118 mg/dL — ABNORMAL HIGH (ref 65–99)
POTASSIUM: 4.6 mmol/L (ref 3.5–5.1)
Sodium: 140 mmol/L (ref 135–145)

## 2015-02-05 LAB — CBC
HEMATOCRIT: 48 % (ref 39.0–52.0)
Hemoglobin: 16.2 g/dL (ref 13.0–17.0)
MCH: 31.3 pg (ref 26.0–34.0)
MCHC: 33.8 g/dL (ref 30.0–36.0)
MCV: 92.8 fL (ref 78.0–100.0)
PLATELETS: 251 10*3/uL (ref 150–400)
RBC: 5.17 MIL/uL (ref 4.22–5.81)
RDW: 13.9 % (ref 11.5–15.5)
WBC: 7.3 10*3/uL (ref 4.0–10.5)

## 2015-02-05 MED ORDER — SACUBITRIL-VALSARTAN 24-26 MG PO TABS
1.0000 | ORAL_TABLET | Freq: Once | ORAL | Status: AC
  Administered 2015-02-05: 1 via ORAL
  Filled 2015-02-05: qty 1

## 2015-02-05 MED ORDER — CARVEDILOL 25 MG PO TABS
25.0000 mg | ORAL_TABLET | Freq: Once | ORAL | Status: AC
  Administered 2015-02-05: 25 mg via ORAL
  Filled 2015-02-05: qty 1

## 2015-02-05 MED ORDER — HYDRALAZINE HCL 50 MG PO TABS
50.0000 mg | ORAL_TABLET | Freq: Once | ORAL | Status: AC
  Administered 2015-02-05: 50 mg via ORAL
  Filled 2015-02-05: qty 1

## 2015-02-05 MED ORDER — FUROSEMIDE 20 MG PO TABS
40.0000 mg | ORAL_TABLET | Freq: Two times a day (BID) | ORAL | Status: DC
  Filled 2015-02-05: qty 2

## 2015-02-05 NOTE — Consult Note (Signed)
Patient ID: Roger Martinez MRN: [id number], DOB/AGE: February 15, 1966   Admit date: 02/05/2015   Primary Physician: Candee Furbish, MD Primary Cardiologist: Dr. Johnsie Cancel Electrophysiologist: Dr. Lovena Le  Pt. Profile:  49 year old AA male with history of CAD status post CABG in 2011, ischemic cardiomyopathy, chronic systolic heart failure status post ICD placement, tobacco abuse, HTN,  HLD and medication noncompliance who presents to the [facility] emergency department with complaint of chest pain.  Problem List  Past Medical History  Diagnosis Date  . Coronary artery disease   . Hypertension   . Congestive heart failure ([place])   . Other and unspecified hyperlipidemia   . Anxiety state, unspecified   . MI (myocardial infarction) ([place]) 03/2011  . STD (sexually transmitted disease)     Past Surgical History  Procedure Laterality Date  . Bypass graft  2011    3 vessel  . Ep implantable device  06/17/2014    DR [name]  . Implantable cardioverter defibrillator implant N/A 06/17/2014    Procedure: IMPLANTABLE CARDIOVERTER DEFIBRILLATOR IMPLANT;  Surgeon: Deboraha Sprang, MD;  Location: [facility] CATH LAB;  Service: Cardiovascular;  Laterality: N/A;     Allergies  No Known Allergies  HPI  The patient is a 49 year old African-American male, followed by Dr. [name], who presents to the [facility] emergency department with a complaint of chest pain. His past cardiac history significant for CAD status post 3 vessel bypass surgery in 2011, ischemic cardiomyopathy, chronic systolic heart failure with an EF of 20-25%, status post ICD placement which is followed by Dr. Lovena Le, hypertension, hyperlipidemia, tobacco abuse and history of medical noncompliance.  He reports that he recently quit smoking 5 weeks ago however due to stressful financial problems over the last several days he has been under a great deal of stress. Subsequently he smoked 2 cigarettes yesterday evening. Shortly after smoking the  cigarettes he developed right-sided chest pressure. He notes the discomfort was similar to his previous angina. The discomfort is nonradiating. Not worse with exertion. It is worse with talking. He also notes associated diaphoresis and dyspnea. No syncope/near-syncope. He notes nausea but no vomiting. He admits that he has not been fully compliant with his medications. Due to the high cost of his medications and inability to afford most of them, he spreads his medications out and only takes them every other day. This includes his aspirin.  He reports that he did take 4 baby aspirin when he had his chest discomfort and this relieved the pain. He continues to have some discomfort in the ED but it is significant improved, now 1 out of 10.  Point of care troponin the ED is negative. He has chronically abnormal EKGs. His EKG today shows ST abnormalities/ TWI in the anteriorlateral leads. He is hypertensive with SBP in the 263F and diastolic in the low 354T. CXR shows cardiomegaly with mild pulmonary vascular congestion.   Home Medications  Prior to Admission medications   Medication Sig Start Date End Date Taking? Authorizing Provider  aspirin EC 81 MG tablet Take 81 mg by mouth every Monday, Wednesday, and Friday.   Yes Historical Provider, MD  carvedilol (COREG) 25 MG tablet Take 1 tablet (25 mg total) by mouth 2 (two) times daily with a meal. Patient taking differently: Take 25 mg by mouth every Monday, Wednesday, and Friday.  06/14/14  Yes Jerline Pain, MD  furosemide (LASIX) 40 MG tablet Take 1 tablet (40 mg total) by mouth 2 (two) times daily. Patient taking differently:  Take 40 mg by mouth every Monday, Wednesday, and Friday.  06/14/14  Yes Jerline Pain, MD  hydrALAZINE (APRESOLINE) 50 MG tablet Take 50 mg by mouth every Monday, Wednesday, and Friday.    Yes Historical Provider, MD  nitroGLYCERIN (NITROSTAT) 0.4 MG SL tablet Place 1 tablet (0.4 mg total) under the tongue every 5 (five) minutes as  needed for chest pain. 06/14/14  Yes Jerline Pain, MD  potassium chloride SA (K-DUR,KLOR-CON) 20 MEQ tablet Take 20 mEq by mouth every Monday, Wednesday, and Friday.   Yes Historical Provider, MD  sacubitril-valsartan (ENTRESTO) 24-26 MG Take 1 tablet by mouth 2 (two) times daily. Take 1 tablet by mouth every Monday, Wednesday, and Friday.   Yes Historical Provider, MD  sildenafil (VIAGRA) 100 MG tablet Take 1 tablet (100 mg total) by mouth daily as needed for erectile dysfunction. 09/30/14  Yes Jerline Pain, MD  simvastatin (ZOCOR) 20 MG tablet Take 20 mg by mouth every Monday, Wednesday, and Friday.    Yes Historical Provider, MD  sacubitril-valsartan (ENTRESTO) 24-26 MG Take 1 tablet by mouth 2 (two) times daily. 11/13/14   Deboraha Sprang, MD    Family History  Family History  Problem Relation Age of Onset  . Hypertension Mother   . Diabetes Mother     Social History  Social History   Social History  . Marital Status: Legally Separated    Spouse Name: N/A  . Number of Children: N/A  . Years of Education: N/A   Occupational History  . Not on file.   Social History Main Topics  . Smoking status: Current Every Day Smoker -- 0.00 packs/day for 0 years    Types: Cigarettes  . Smokeless tobacco: Never Used  . Alcohol Use: No     Comment: ocassionaly  . Drug Use: No  . Sexual Activity: Not on file   Other Topics Concern  . Not on file   Social History Narrative   Truck driver    Married   Smokes 1/2 ppd, [facility] + 12/25                 Review of Systems General:  No chills, fever, night sweats or weight changes.  Cardiovascular:  + chest pain, no dyspnea on exertion, edema, orthopnea, palpitations, paroxysmal nocturnal dyspnea. Dermatological: No rash, lesions/masses Respiratory: No cough, dyspnea Urologic: No hematuria, dysuria Abdominal:   No nausea, vomiting, diarrhea, bright red blood per rectum, melena, or hematemesis Neurologic:  No visual changes, wkns, changes  in mental status. All other systems reviewed and are otherwise negative except as noted above.  Physical Exam  Blood pressure 168/104, pulse 69, temperature 98.7 F (37.1 C), temperature source Oral, resp. rate 18, height 5\' 11"  (1.803 m), weight 194 lb (87.998 kg), SpO2 100 %.  General:  NAD, irritable  Psych: Normal affect. Neuro: Alert and oriented X 3. Moves all extremities spontaneously. HEENT: Normal  Neck: Supple without bruits or JVD. Lungs:  Resp regular and unlabored, CTA. Heart: RRR no s3, s4, or murmurs. Abdomen: Soft, non-tender, non-distended, BS + x 4.  Extremities: No clubbing, cyanosis or edema. DP/PT/Radials 2+ and equal bilaterally.  Labs  Troponin (Point of Care Test)  Recent Labs  02/05/15 1106  TROPIPOC 0.03   No results for input(s): CKTOTAL, CKMB, TROPONINI in the last 72 hours. Lab Results  Component Value Date   WBC 7.3 02/05/2015   HGB 16.2 02/05/2015   HCT 48.0 02/05/2015   MCV 92.8 02/05/2015  PLT 251 02/05/2015    Recent Labs Lab 02/05/15 1100  NA 140  K 4.6  CL 106  CO2 24  BUN 13  CREATININE 1.10  CALCIUM 9.7  GLUCOSE 118*   Lab Results  Component Value Date   CHOL 171 04/06/2013   HDL 46 04/06/2013   LDLCALC 118* 04/06/2013   TRIG 34 04/06/2013   No results found for: DDIMER   Radiology/Studies  Dg Chest 2 View  02/05/2015  CLINICAL DATA:  49 year old male with acute chest pain today. History of CABG. EXAM: CHEST  2 VIEW COMPARISON:  12/10/2014 and prior chest radiographs dating back to 01/09/2012 FINDINGS: Cardiomegaly, CABG changes and left-sided AICD again noted. Right hilar fullness is unchanged from 2013. Pleural fissural thickening versus trace effusions again noted. Mild pulmonary vascular congestion is present. There is no evidence of focal airspace disease, consolidation, pneumothorax, suspicious mass or acute bony abnormality. IMPRESSION: Cardiomegaly with mild pulmonary vascular congestion. Unchanged pleural  fissural thickening versus trace pleural effusions. Electronically Signed   By: Margarette Canada M.D.   On: 02/05/2015 12:11    ECG  Minimal ST elevation, anterior leads, TWI (chronic)  ASSESSMENT AND PLAN  1. Chest Pain: given the patient's significant past medical history, recent symptoms and history of medical noncompliance, admission for r/o with LHC was recommended however the patient has refused further medical care and is leaving the ED AMA. His Primary cardiologist, Dr. [name], will be notified. We will contact the patient later this week to try to arrange office f/u.  2. Chronic Systolic CHF: continue coreg, hydralazine and lasix. Due to difficulties affording Entreso, would recommend changing to ACE-I, preferably lisinopril due to affordability.   3. Tobacco Abuse: smoking cessation strongly advised.   4. HTN: poorly controlled 2/2 compliance issues.   5. ICD: followed by Dr. Lovena Le. Denies any shocks.   Signed, Lyda Jester, PA-C 02/05/2015, 1:25 PM  See separate progress note  Patient left AMA  Jenkins Rouge

## 2015-02-05 NOTE — Progress Notes (Signed)
Patient ID: Roger Martinez, male   DOB: Jan 28, 1966, 49 y.o.   MRN: [id number] Patient examined chart reviewed See full consult note by PA 49 y.o. with known ischemic DCM and previous bypass. Chronically abnormal ECG SSCP right sided ? Ppt by smoking again Not compliant with meds BP and HR high in ER Troponin negative Exam with clear lungs S4 gallop previous sternotomy no CHF CXR with mild cephalization  Wanted to admit for r/o Myovue not sensitive for new obstruction with known previous ischemic DCM, LVH and large size Best approach would be diagnostic cath.  Patient wants to leave AMA Risks discussed and he will not listen Offerred to make outpatient office visit and he declined this as well  Seems to be a repeating pattern with this patient.  Jenkins Rouge

## 2015-02-05 NOTE — ED Provider Notes (Signed)
CSN: [id number]     Arrival date & time 02/05/15  1041 History   First MD Initiated Contact with Patient 02/05/15 1111     Chief Complaint  Patient presents with  . Chest Pain     (Consider location/radiation/quality/duration/timing/severity/associated sxs/prior Treatment) HPI   Roger Martinez is a 49 y.o. male, pt with CAD, MI, CABG in 2011, and CHF, presents with chest pressure/tightness in the center of his chest, rated 10/10, non-radiating, a resolved with NTG. Pain started at around 7am today when he was smoking a cigarette. Pt also complains of productive cough with yellow sputum also starting today. Pt currently pain free. Denies N/V, shortness of breath, fever/chills, abdominal pain, peripheral edema, or any other pain or complaints.     Past Medical History  Diagnosis Date  . Coronary artery disease   . Hypertension   . Congestive heart failure ([place])   . Other and unspecified hyperlipidemia   . Anxiety state, unspecified   . MI (myocardial infarction) ([place]) 03/2011  . STD (sexually transmitted disease)    Past Surgical History  Procedure Laterality Date  . Bypass graft  2011    3 vessel  . Ep implantable device  06/17/2014    DR [name]  . Implantable cardioverter defibrillator implant N/A 06/17/2014    Procedure: IMPLANTABLE CARDIOVERTER DEFIBRILLATOR IMPLANT;  Surgeon: Deboraha Sprang, MD;  Location: [facility] CATH LAB;  Service: Cardiovascular;  Laterality: N/A;   Family History  Problem Relation Age of Onset  . Hypertension Mother   . Diabetes Mother    Social History  Substance Use Topics  . Smoking status: Current Every Day Smoker -- 0.00 packs/day for 0 years    Types: Cigarettes  . Smokeless tobacco: Never Used  . Alcohol Use: No     Comment: ocassionaly    Review of Systems  Constitutional: Negative for fever, chills, diaphoresis and unexpected weight change.  Respiratory: Negative for cough, chest tightness and shortness of breath.   Cardiovascular: Positive  for chest pain. Negative for palpitations and leg swelling.  Gastrointestinal: Negative for nausea, vomiting, abdominal pain, diarrhea and constipation.  Genitourinary: Negative for dysuria and flank pain.  Musculoskeletal: Negative for back pain.  Skin: Negative for color change and pallor.  Neurological: Negative for dizziness, syncope, weakness and light-headedness.  All other systems reviewed and are negative.     Allergies  Review of patient's allergies indicates no known allergies.  Home Medications   Prior to Admission medications   Medication Sig Start Date End Date Taking? Authorizing Provider  aspirin EC 81 MG tablet Take 81 mg by mouth every Monday, Wednesday, and Friday.   Yes Historical Provider, MD  carvedilol (COREG) 25 MG tablet Take 1 tablet (25 mg total) by mouth 2 (two) times daily with a meal. Patient taking differently: Take 25 mg by mouth every Monday, Wednesday, and Friday.  06/14/14  Yes Jerline Pain, MD  furosemide (LASIX) 40 MG tablet Take 1 tablet (40 mg total) by mouth 2 (two) times daily. Patient taking differently: Take 40 mg by mouth every Monday, Wednesday, and Friday.  06/14/14  Yes Jerline Pain, MD  hydrALAZINE (APRESOLINE) 50 MG tablet Take 50 mg by mouth every Monday, Wednesday, and Friday.    Yes Historical Provider, MD  nitroGLYCERIN (NITROSTAT) 0.4 MG SL tablet Place 1 tablet (0.4 mg total) under the tongue every 5 (five) minutes as needed for chest pain. 06/14/14  Yes Jerline Pain, MD  potassium chloride SA (K-DUR,KLOR-CON) 20 MEQ  tablet Take 20 mEq by mouth every Monday, Wednesday, and Friday.   Yes Historical Provider, MD  sacubitril-valsartan (ENTRESTO) 24-26 MG Take 1 tablet by mouth 2 (two) times daily. Take 1 tablet by mouth every Monday, Wednesday, and Friday.   Yes Historical Provider, MD  sildenafil (VIAGRA) 100 MG tablet Take 1 tablet (100 mg total) by mouth daily as needed for erectile dysfunction. 09/30/14  Yes Jerline Pain, MD  simvastatin  (ZOCOR) 20 MG tablet Take 20 mg by mouth every Monday, Wednesday, and Friday.    Yes Historical Provider, MD  sacubitril-valsartan (ENTRESTO) 24-26 MG Take 1 tablet by mouth 2 (two) times daily. 11/13/14   Deboraha Sprang, MD   BP 164/94 mmHg  Pulse 72  Temp(Src) 98.7 F (37.1 C) (Oral)  Resp 16  Ht 5\' 11"  (1.803 m)  Wt 194 lb (87.998 kg)  BMI 27.07 kg/m2  SpO2 99% Physical Exam  Constitutional: He appears well-developed and well-nourished. No distress.  HENT:  Head: Normocephalic and atraumatic.  Eyes: Conjunctivae are normal. Pupils are equal, round, and reactive to light.  Cardiovascular: Normal rate, regular rhythm and normal heart sounds.   Pulmonary/Chest: Effort normal and breath sounds normal. No respiratory distress.  Abdominal: Soft. Bowel sounds are normal.  Musculoskeletal: He exhibits no edema or tenderness.  No ankle edema.  Neurological: He is alert.  Skin: Skin is warm and dry. He is not diaphoretic.  Nursing note and vitals reviewed.   ED Course  Procedures (including critical care time) Labs Review Labs Reviewed  BASIC METABOLIC PANEL - Abnormal; Notable for the following:    Glucose, Bld 118 (*)    All other components within normal limits  CBC  I-STAT TROPOININ, ED    Imaging Review Dg Chest 2 View  02/05/2015  CLINICAL DATA:  49 year old male with acute chest pain today. History of CABG. EXAM: CHEST  2 VIEW COMPARISON:  12/10/2014 and prior chest radiographs dating back to 01/09/2012 FINDINGS: Cardiomegaly, CABG changes and left-sided AICD again noted. Right hilar fullness is unchanged from 2013. Pleural fissural thickening versus trace effusions again noted. Mild pulmonary vascular congestion is present. There is no evidence of focal airspace disease, consolidation, pneumothorax, suspicious mass or acute bony abnormality. IMPRESSION: Cardiomegaly with mild pulmonary vascular congestion. Unchanged pleural fissural thickening versus trace pleural effusions.  Electronically Signed   By: Margarette Canada M.D.   On: 02/05/2015 12:11   I have personally reviewed and evaluated these images and lab results as part of my medical decision-making.   EKG Interpretation   Date/Time:  Wednesday February 05 2015 10:48:34 EDT Ventricular Rate:  78 PR Interval:  181 QRS Duration: 118 QT Interval:  439 QTC Calculation: 500 R Axis:   112 Text Interpretation:  Sinus rhythm LAE, consider biatrial enlargement  Nonspecific intraventricular conduction delay Repol abnrm suggests  ischemia, lateral leads Minimal ST elevation, anterior leads No  significant change was found Confirmed by [facility]  MD, TREY ([id number]) on  02/05/2015 11:27:16 AM      MDM   Final diagnoses:  Chest pain, unspecified chest pain type    Roger Martinez presents with chest pressure that was relieved with NTG.   Findings and plan of care discussed with Serita Grit, MD.  Pt HEART score shows moderate risk for MACE. EKG results consistent with previous EKGs. Troponin negative. CBC and BMP show no significant abnormalities. 12:45 PM Pt is still pain free. Will obtain a second troponin at the 3 hour mark. Cardiology consult placed  due to pt risk level. Patient's cardiologist is Dr. [name]. Cardiology called and agreed to see patient.  1:37 PM Roger Rouge, MD, Cardiologist spoke with pt, told him that they wanted to admit him and perform a cardiac cath on him. Pt refused and stated he was leaving AMA. Pt refused to sign AMA form, got dressed, and walked out. Multiple attempts were made to try to convince patient to stay by multiple providers, but pt states, "I'm mad and I'm hungry and I am leaving."  Pt was told that the reason he wasn't given food while he was here was because of the possibility of a cardiac cath today. Pt states, "I don't care. I'm leaving and I won't sign anything."  The risks of leaving were explained to the patient as well as the benefits of staying and allowing admission. Pt  states, "I know all of that. I don't care." Pt has no mental status changes, appeared to be of sound mind, and was able to carry on a fluid conversation. Pt advised to come back should he change is mind and that he is always welcome to come back for treatment. Pt was also presented with an alternate plan by cardiology, which gave pt the option of seeing his cardiologist outpatient and scheduling a cardiac cath at that time. Pt refused this as well stating, "I'm not doing that."     Lorayne Bender, PA-C 02/05/15 [place], PA-C 02/05/15 [place], MD 02/06/15 [phone]

## 2015-02-05 NOTE — ED Notes (Signed)
Pt brought in via [facility]. Pt started having central chest pain, describing pain as pressure last night. Pt recently quit smoking cigarettes about 2.5 months ago. Pt smoked one cigarette last night, and the chest pain came. Pt took 1 nitroglycerin and pain subsided. Pt slept last night, woke up this morning smoked another cigarette and started experiencing the same chest pain again. Pt took another 1 nitroglycerin tablet, and 324 mg of ASA. When EMS arrived pt was noted to be very diaphoretic, pale, SOB, and anxious. Pt is now chest pain free, and has no complaints. Pt denies any N/V or pain radiation.

## 2015-02-05 NOTE — ED Notes (Signed)
Pt is upset, about not being able to eat, and not wanting to have a heart cath done. RN educated patient on the importance of staying. Pt verbalized understanding, and still insisted on leaving.

## 2015-02-12 ENCOUNTER — Ambulatory Visit (INDEPENDENT_AMBULATORY_CARE_PROVIDER_SITE_OTHER): Payer: PRIVATE HEALTH INSURANCE | Admitting: *Deleted

## 2015-02-12 DIAGNOSIS — I255 Ischemic cardiomyopathy: Secondary | ICD-10-CM

## 2015-02-14 NOTE — Progress Notes (Signed)
Remote ICD transmission.   

## 2015-03-05 ENCOUNTER — Encounter: Payer: Self-pay | Admitting: Cardiology

## 2015-03-05 LAB — CUP PACEART REMOTE DEVICE CHECK
Date Time Interrogation Session: 20161102063600
HighPow Impedance: 60 Ohm
Implantable Lead Implant Date: 20160307
Lead Channel Impedance Value: 400 Ohm
Lead Channel Sensing Intrinsic Amplitude: 25 mV
MDC IDC LEAD LOCATION: 753860
MDC IDC LEAD MODEL: 293
MDC IDC LEAD SERIAL: 349317
MDC IDC MSMT BATTERY REMAINING LONGEVITY: 144 mo
MDC IDC MSMT BATTERY REMAINING PERCENTAGE: 100 %
MDC IDC PG SERIAL: 201422
MDC IDC SET LEADCHNL RV PACING AMPLITUDE: 3 V
MDC IDC SET LEADCHNL RV PACING PULSEWIDTH: 0.4 ms
MDC IDC SET LEADCHNL RV SENSING SENSITIVITY: 0.6 mV
MDC IDC STAT BRADY RV PERCENT PACED: 0 %

## 2015-03-17 ENCOUNTER — Ambulatory Visit: Payer: PRIVATE HEALTH INSURANCE | Admitting: Cardiology

## 2015-03-17 ENCOUNTER — Telehealth: Payer: Self-pay | Admitting: Cardiology

## 2015-03-17 NOTE — Telephone Encounter (Signed)
error 

## 2015-03-18 ENCOUNTER — Ambulatory Visit (INDEPENDENT_AMBULATORY_CARE_PROVIDER_SITE_OTHER): Payer: PRIVATE HEALTH INSURANCE | Admitting: Cardiology

## 2015-03-18 ENCOUNTER — Encounter: Payer: Self-pay | Admitting: Cardiology

## 2015-03-18 VITALS — BP 140/88 | HR 67 | Ht 71.0 in | Wt 201.8 lb

## 2015-03-18 DIAGNOSIS — I10 Essential (primary) hypertension: Secondary | ICD-10-CM | POA: Diagnosis not present

## 2015-03-18 DIAGNOSIS — I2510 Atherosclerotic heart disease of native coronary artery without angina pectoris: Secondary | ICD-10-CM

## 2015-03-18 DIAGNOSIS — I5022 Chronic systolic (congestive) heart failure: Secondary | ICD-10-CM

## 2015-03-18 DIAGNOSIS — I255 Ischemic cardiomyopathy: Secondary | ICD-10-CM | POA: Diagnosis not present

## 2015-03-18 MED ORDER — NICOTINE 21 MG/24HR TD PT24: 21.0000 mg | MEDICATED_PATCH | Freq: Every day | TRANSDERMAL | Status: DC

## 2015-03-18 MED ORDER — SILDENAFIL CITRATE 100 MG PO TABS: 100.0000 mg | ORAL_TABLET | Freq: Every day | ORAL | Status: DC | PRN

## 2015-03-18 MED ORDER — SACUBITRIL-VALSARTAN 24-26 MG PO TABS: 1.0000 | ORAL_TABLET | Freq: Two times a day (BID) | ORAL | Status: DC

## 2015-03-18 NOTE — Progress Notes (Signed)
[facility]. [address], [address] [place], [place]  [address] Phone: [phone] Fax:  [phone]  Date:  03/18/2015   ID:  Roger Martinez, DOB 08/31/1965, MRN [id number]  PCP:  Candee Furbish, MD   History of Present Illness: Armour Silver is a 49 y.o. male with prior history of three-vessel bypass in 2011, coronary artery disease, hypertension here for follow-up.ICD placed. Dr. Lovena Le patient.  Patient previously left AMA from hospital on 04/07/13 with new diagnosis of systolic heart failure with ejection fraction of 30-35%. His EF has been repeated and was 20/25 percent. Because of this, defibrillator was placed. He was quite thankful on today's visit. He seems to have turned the corner with his medical compliance. Excellent.  He was a Administrator. Bruise left of sternum. Basketball.  2 miles a day walking. Feels good. No shortness of breath, no chest pain.  11/27/14 - Not taking Entesto but every 2-3 days. Same with other heart failure medications. Anxiety medication does not agree with him. Held lasix for several days. Every 3 days he is taking coreg. Feels anxious if takes daily. He thinks he has been eating more. He feels well however, no shortness of breath  03/18/15 - on 02/05/15 he presented to the emergency room and left AMA, saw Dr. condition he went to be admitted for rule out MI dilated cardiopathy, LVH best approach would be for diagnostic cath. He had substernal chest pain, right-sided, noncompliant with medications.   Really wants help to stop smoking. pets why he came today he states.  Really why he is here. Wants patch. Tried one prior to Thanksgiving. [employer], just singed up. he is not having any shortness of breath, no chest pain currently, no syncope. No orthopnea.    Wt Readings from Last 3 Encounters:  03/18/15 201 lb 12.8 oz (91.536 kg)  02/05/15 194 lb (87.998 kg)  11/27/14 200 lb 12.8 oz (91.082 kg)     Past Medical History  Diagnosis Date  . Coronary  artery disease   . Hypertension   . Congestive heart failure ([place])   . Other and unspecified hyperlipidemia   . Anxiety state, unspecified   . MI (myocardial infarction) ([place]) 03/2011  . STD (sexually transmitted disease)     Past Surgical History  Procedure Laterality Date  . Bypass graft  2011    3 vessel  . Ep implantable device  06/17/2014    DR [name]  . Implantable cardioverter defibrillator implant N/A 06/17/2014    Procedure: IMPLANTABLE CARDIOVERTER DEFIBRILLATOR IMPLANT;  Surgeon: Deboraha Sprang, MD;  Location: [facility] CATH LAB;  Service: Cardiovascular;  Laterality: N/A;    Current Outpatient Prescriptions  Medication Sig Dispense Refill  . aspirin EC 81 MG tablet Take 81 mg by mouth every Monday, Wednesday, and Friday.    . carvedilol (COREG) 25 MG tablet Take 1 tablet (25 mg total) by mouth 2 (two) times daily with a meal. (Patient taking differently: Take 25 mg by mouth every Monday, Wednesday, and Friday. ) 60 tablet 3  . furosemide (LASIX) 40 MG tablet Take 1 tablet (40 mg total) by mouth 2 (two) times daily. (Patient taking differently: Take 40 mg by mouth every Monday, Wednesday, and Friday. ) 60 tablet 3  . hydrALAZINE (APRESOLINE) 50 MG tablet Take 50 mg by mouth every Monday, Wednesday, and Friday.     . nitroGLYCERIN (NITROSTAT) 0.4 MG SL tablet Place 1 tablet (0.4 mg total) under the tongue every 5 (five)  minutes as needed for chest pain. 25 tablet 3  . potassium chloride SA (K-DUR,KLOR-CON) 20 MEQ tablet Take 20 mEq by mouth every Monday, Wednesday, and Friday.    . sacubitril-valsartan (ENTRESTO) 24-26 MG Take 1 tablet by mouth 2 (two) times daily. Take 1 tablet by mouth every Monday, Wednesday, and Friday.    . sildenafil (VIAGRA) 100 MG tablet Take 1 tablet (100 mg total) by mouth daily as needed for erectile dysfunction. 10 tablet prn  . simvastatin (ZOCOR) 20 MG tablet Take 20 mg by mouth every Monday, Wednesday, and Friday.      No current facility-administered  medications for this visit.    Allergies:   No Known Allergies  Social History:  The patient  reports that he has been smoking Cigarettes.  He has been smoking about 0.00 packs per day for the past 0 years. He has never used smokeless tobacco. 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 Hypertension Mother   . Diabetes Mother     ROS:  Please see the history of present illness.   Denies any syncope, bleeding, positive orthopnea, positive PND, no fevers, chills, rash   All other systems reviewed and negative.   PHYSICAL EXAM: VS:  BP 140/88 mmHg  Pulse 67  Ht 5\' 11"  (1.803 m)  Wt 201 lb 12.8 oz (91.536 kg)  BMI 28.16 kg/m2  SpO2 99% Well nourished, well developed, in no acute distress HEENT: normal, [place]/AT, EOMI Ext: no edema Skin: warm and dry, mild bruising left of sternum.  GU: deferred Neuro: no focal abnormalities noted, AAO x 3  EKG:  03/20/14-sinus bradycardia rate 56, T-wave inversion in precordial/inferior leads-no change from prior 05/08/13 :Sinus rhythm rate 80 with T-wave inversion through precordial leads as well as inferior leads, possible ischemia. No significant change from prior.  Lab work, hospital records extensively reviewed. 3/16-creatinine 1.02, troponin normal, BNP 4500, LDL 118, hemoglobin 15.3  Echocardiogram:   04/06/14 - Left ventricle: The cavity size was normal. Wall thickness was increased in a pattern of severe LVH. Systolic function was moderately to severely reduced. The estimated ejection fraction was in the range of 20-25%. Diffuse hypokinesis. - Aortic valve: Mild regurgitation. - Mitral valve: Mild regurgitation. - Left atrium: The atrium was moderately dilated. - Right atrium: The atrium was mildly dilated. - Pulmonary arteries: PA peak pressure: 61mm Hg (S).  ASSESSMENT AND PLAN:  1. Cardiomyopathy/chronic systolic heart failure-EF 25%-30 on repeat  ECHO.  Admitted December 2014 with acute systolic heart failure. Previously noncompliance. Truck driver. Defibrillator in place. Teary-eyed at times in the past.. Low-salt diet. Fluid restriction. Thankfully, he feels better. NYHA 1-2. He was started on Entresto by Dr. [name]. Unfortunately, he is still demonstrating issues with taking his medications. Thankfully he  told me that he is taking him every 3 days. Try to encourage compliance however this is been an ongoing issue. I wonder if he is truly taking the medication.  2. Coronary artery disease-post bypass/CABG in [facility]. no active anginal symptoms, right-sided chest pain previously in the emergency room, troponin was negative. He refused admission. 3. Essential hypertension- Continue to encourage medication use. 4. Erectile dysfunction-discussed with Dr. [name] as well. We would be fine with him being on agent. We will go ahead and give him Viagra 100mg . He may cut those if needed. I'm fine giving him 10 tablets with refills.  5. Hyperlipidemia-taking simvastatin  20 mg every other day. As he is having myalgias. 6. 36-month follow-up  Signed, Candee Furbish, MD [facility]  03/18/2015 9:55 AM

## 2015-03-18 NOTE — Telephone Encounter (Signed)
Left message to c/b.

## 2015-03-18 NOTE — Telephone Encounter (Signed)
Spoke with [facility] to clarify the RX for [employer].  They are aware the pt is to be taking po BID but is taking it M/W/F only.  Advised the rx is for one BID however we have to document how the pt is taking it as well.  Of note - he is taking it this way because of cost.

## 2015-03-18 NOTE — Patient Instructions (Signed)
Medication Instructions:  The current medical regimen is effective;  continue present plan and medications. You may use nicotine patches as directed.  Follow-Up: Follow up in 4 months with Dr. [name].  You will receive a letter in the mail 2 months before you are due.  Please call us when you receive this letter to schedule your follow up appointment.  If you need a refill on your cardiac medications before your next appointment, please call your pharmacy.  Thank you for choosing [facility]!!

## 2015-03-18 NOTE — Telephone Encounter (Signed)
New message      Calling to talk to the nurse to get clarifications on the direction for entresto that was sent in today.

## 2015-04-04 ENCOUNTER — Other Ambulatory Visit: Payer: Self-pay | Admitting: Cardiology

## 2015-04-04 NOTE — Telephone Encounter (Signed)
Follow up       *STAT* If patient is at the pharmacy, call can be transferred to refill team.   1. Which medications need to be refilled? (please list name of each medication and dose if known) nitrostat 2. Which pharmacy/location (including street and city if local pharmacy) is medication to be sent to? [facility]  3. Do they need a 30 day or 90 day supply?

## 2015-04-27 ENCOUNTER — Emergency Department (HOSPITAL_COMMUNITY)
Admission: EM | Admit: 2015-04-27 | Discharge: 2015-04-27 | Disposition: A | Discharge status: Home / Self Care | Payer: PRIVATE HEALTH INSURANCE | Attending: Emergency Medicine | Admitting: Emergency Medicine

## 2015-04-27 ENCOUNTER — Emergency Department (HOSPITAL_COMMUNITY): Payer: PRIVATE HEALTH INSURANCE

## 2015-04-27 ENCOUNTER — Encounter (HOSPITAL_COMMUNITY): Payer: Self-pay | Admitting: Emergency Medicine

## 2015-04-27 DIAGNOSIS — Z79899 Other long term (current) drug therapy: Secondary | ICD-10-CM | POA: Insufficient documentation

## 2015-04-27 DIAGNOSIS — F17210 Nicotine dependence, cigarettes, uncomplicated: Secondary | ICD-10-CM | POA: Diagnosis not present

## 2015-04-27 DIAGNOSIS — I255 Ischemic cardiomyopathy: Secondary | ICD-10-CM

## 2015-04-27 DIAGNOSIS — R059 Cough, unspecified: Secondary | ICD-10-CM

## 2015-04-27 DIAGNOSIS — I509 Heart failure, unspecified: Secondary | ICD-10-CM | POA: Diagnosis not present

## 2015-04-27 DIAGNOSIS — R7989 Other specified abnormal findings of blood chemistry: Secondary | ICD-10-CM | POA: Diagnosis not present

## 2015-04-27 DIAGNOSIS — Z7982 Long term (current) use of aspirin: Secondary | ICD-10-CM | POA: Insufficient documentation

## 2015-04-27 DIAGNOSIS — E785 Hyperlipidemia, unspecified: Secondary | ICD-10-CM | POA: Diagnosis not present

## 2015-04-27 DIAGNOSIS — J069 Acute upper respiratory infection, unspecified: Secondary | ICD-10-CM

## 2015-04-27 DIAGNOSIS — I2510 Atherosclerotic heart disease of native coronary artery without angina pectoris: Secondary | ICD-10-CM | POA: Diagnosis not present

## 2015-04-27 DIAGNOSIS — R531 Weakness: Secondary | ICD-10-CM | POA: Diagnosis present

## 2015-04-27 DIAGNOSIS — Z95810 Presence of automatic (implantable) cardiac defibrillator: Secondary | ICD-10-CM | POA: Diagnosis not present

## 2015-04-27 DIAGNOSIS — I10 Essential (primary) hypertension: Secondary | ICD-10-CM | POA: Diagnosis not present

## 2015-04-27 DIAGNOSIS — B349 Viral infection, unspecified: Secondary | ICD-10-CM | POA: Insufficient documentation

## 2015-04-27 DIAGNOSIS — I252 Old myocardial infarction: Secondary | ICD-10-CM | POA: Insufficient documentation

## 2015-04-27 DIAGNOSIS — R05 Cough: Secondary | ICD-10-CM

## 2015-04-27 DIAGNOSIS — R778 Other specified abnormalities of plasma proteins: Secondary | ICD-10-CM

## 2015-04-27 DIAGNOSIS — Z8659 Personal history of other mental and behavioral disorders: Secondary | ICD-10-CM | POA: Insufficient documentation

## 2015-04-27 DIAGNOSIS — Z8619 Personal history of other infectious and parasitic diseases: Secondary | ICD-10-CM | POA: Diagnosis not present

## 2015-04-27 DIAGNOSIS — I472 Ventricular tachycardia: Secondary | ICD-10-CM | POA: Diagnosis not present

## 2015-04-27 LAB — URINALYSIS, ROUTINE W REFLEX MICROSCOPIC
BILIRUBIN URINE: NEGATIVE
GLUCOSE, UA: NEGATIVE mg/dL
Hgb urine dipstick: NEGATIVE
KETONES UR: 15 mg/dL — AB
Leukocytes, UA: NEGATIVE
NITRITE: NEGATIVE
PH: 5 (ref 5.0–8.0)
Protein, ur: NEGATIVE mg/dL
Specific Gravity, Urine: 1.013 (ref 1.005–1.030)

## 2015-04-27 LAB — BASIC METABOLIC PANEL
ANION GAP: 13 (ref 5–15)
BUN: 11 mg/dL (ref 6–20)
CO2: 22 mmol/L (ref 22–32)
Calcium: 9.2 mg/dL (ref 8.9–10.3)
Chloride: 104 mmol/L (ref 101–111)
Creatinine, Ser: 1.16 mg/dL (ref 0.61–1.24)
GFR calc non Af Amer: >60 mL/min (ref >60)
GLUCOSE: 106 mg/dL — AB (ref 65–99)
POTASSIUM: 3.8 mmol/L (ref 3.5–5.1)
Sodium: 139 mmol/L (ref 135–145)

## 2015-04-27 LAB — CBC
HEMATOCRIT: 47.3 % (ref 39.0–52.0)
HEMOGLOBIN: 16 g/dL (ref 13.0–17.0)
MCH: 30.8 pg (ref 26.0–34.0)
MCHC: 33.8 g/dL (ref 30.0–36.0)
MCV: 91 fL (ref 78.0–100.0)
Platelets: 200 10*3/uL (ref 150–400)
RBC: 5.2 MIL/uL (ref 4.22–5.81)
RDW: 13.6 % (ref 11.5–15.5)
WBC: 7.1 10*3/uL (ref 4.0–10.5)

## 2015-04-27 LAB — CBG MONITORING, ED: Glucose-Capillary: 95 mg/dL (ref 65–99)

## 2015-04-27 LAB — TROPONIN I: TROPONIN I: 0.1 ng/mL — AB (ref <0.031)

## 2015-04-27 MED ORDER — SODIUM CHLORIDE 0.9 % IV BOLUS (SEPSIS)
1000.0000 mL | Freq: Once | INTRAVENOUS | Status: AC
  Administered 2015-04-27: 1000 mL via INTRAVENOUS

## 2015-04-27 MED ORDER — HYDROCODONE-HOMATROPINE 5-1.5 MG/5ML PO SYRP: 5.0000 mL | ORAL_SOLUTION | Freq: Every evening | ORAL | Status: DC | PRN

## 2015-04-27 MED ORDER — ACETAMINOPHEN 325 MG PO TABS
650.0000 mg | ORAL_TABLET | Freq: Once | ORAL | Status: AC
  Administered 2015-04-27: 650 mg via ORAL
  Filled 2015-04-27: qty 2

## 2015-04-27 NOTE — ED Notes (Signed)
Cardiologist at bedside.  

## 2015-04-27 NOTE — ED Notes (Signed)
Pt cbg 95 

## 2015-04-27 NOTE — ED Provider Notes (Signed)
CSN: [id number]     Arrival date & time 04/27/15  1146 History   First MD Initiated Contact with Patient 04/27/15 1206     Chief Complaint  Patient presents with  . Weakness     (Consider location/radiation/quality/duration/timing/severity/associated sxs/prior Treatment) The history is provided by the patient and medical records. No language interpreter was used.    Roger Martinez is a 50 y.o. male  with a significant cardiac history and defibrillator who presents to the Emergency Department complaining of productive cough, cold, subjective fever since Friday. Patient states he did not sleep at all last night 2/2 his symptoms. Admits to intermittent shortness of breath. Denies chest pain. Took coricidin with no relief. No aggravating or alleviating factors noted.   Past Medical History  Diagnosis Date  . Coronary artery disease   . Hypertension   . Congestive heart failure ([place])   . Other and unspecified hyperlipidemia   . Anxiety state, unspecified   . MI (myocardial infarction) ([place]) 03/2011  . STD (sexually transmitted disease)    Past Surgical History  Procedure Laterality Date  . Bypass graft  2011    3 vessel  . Ep implantable device  06/17/2014    DR [name]  . Implantable cardioverter defibrillator implant N/A 06/17/2014    Procedure: IMPLANTABLE CARDIOVERTER DEFIBRILLATOR IMPLANT;  Surgeon: Deboraha Sprang, MD;  Location: [facility] CATH LAB;  Service: Cardiovascular;  Laterality: N/A;   Family History  Problem Relation Age of Onset  . Hypertension Mother   . Diabetes Mother    Social History  Substance Use Topics  . Smoking status: Current Every Day Smoker -- 0.00 packs/day for 0 years    Types: Cigarettes  . Smokeless tobacco: Never Used  . Alcohol Use: No     Comment: ocassionaly    Review of Systems  Constitutional: Positive for fever, chills and appetite change.  HENT: Positive for congestion. Negative for rhinorrhea and sore throat.   Eyes: Negative for visual  disturbance.  Respiratory: Positive for cough and shortness of breath. Negative for wheezing.   Cardiovascular: Negative.   Gastrointestinal: Negative for nausea, vomiting, abdominal pain, diarrhea and constipation.  Musculoskeletal: Negative for myalgias, back pain, arthralgias and neck pain.  Skin: Negative for rash.  Allergic/Immunologic: Negative for immunocompromised state.  Neurological: Negative for dizziness, weakness and headaches.      Allergies  Review of patient's allergies indicates no known allergies.  Home Medications   Prior to Admission medications   Medication Sig Start Date End Date Taking? Authorizing Provider  aspirin EC 81 MG tablet Take 81 mg by mouth every 3 (three) days.    Yes Historical Provider, MD  carvedilol (COREG) 25 MG tablet Take 1 tablet (25 mg total) by mouth 2 (two) times daily with a meal. Patient taking differently: Take 25 mg by mouth every 3 (three) days.  06/14/14  Yes Jerline Pain, MD  furosemide (LASIX) 40 MG tablet Take 1 tablet (40 mg total) by mouth 2 (two) times daily. Patient taking differently: Take 40 mg by mouth every 3 (three) days.  06/14/14  Yes Jerline Pain, MD  hydrALAZINE (APRESOLINE) 50 MG tablet Take 50 mg by mouth every 3 (three) days.    Yes Historical Provider, MD  NITROSTAT 0.4 MG SL tablet PLACE ONE TABLET UNDER THE TONGUE EVERY 5 MINUTES AS NEEDED FOR CHEST PAIN. 04/04/15  Yes Jerline Pain, MD  potassium chloride SA (K-DUR,KLOR-CON) 20 MEQ tablet Take 20 mEq by mouth every 3 (three)  days.    Yes Historical Provider, MD  sacubitril-valsartan (ENTRESTO) 24-26 MG Take 1 tablet by mouth 2 (two) times daily. Take 1 tablet by mouth every Monday, Wednesday, and Friday. Patient taking differently: Take 1 tablet by mouth every 3 (three) days.  03/18/15  Yes Jerline Pain, MD  sildenafil (VIAGRA) 100 MG tablet Take 1 tablet (100 mg total) by mouth daily as needed for erectile dysfunction. 03/18/15  Yes Jerline Pain, MD  simvastatin  (ZOCOR) 20 MG tablet Take 20 mg by mouth every 3 (three) days.    Yes Historical Provider, MD  HYDROcodone-homatropine (HYCODAN) 5-1.5 MG/5ML syrup Take 5 mLs by mouth at bedtime as needed for cough. 04/27/15   Mimie Goering Pilcher Bosten Newstrom, PA-C   BP 146/96 mmHg  Pulse 77  Temp(Src) 98.4 F (36.9 C) (Oral)  Resp 20  Ht 5\' 11"  (1.803 m)  Wt 88.905 kg  BMI 27.35 kg/m2  SpO2 98% Physical Exam  Constitutional: He is oriented to person, place, and time. He appears well-developed and well-nourished.  Alert and in no acute distress  HENT:  Head: Normocephalic and atraumatic.  Mouth/Throat: Oropharynx is clear and moist. No oropharyngeal exudate.  Cardiovascular: Normal rate, regular rhythm and normal heart sounds.  Exam reveals no gallop and no friction rub.   No murmur heard. Pulmonary/Chest: Effort normal and breath sounds normal. No respiratory distress. He has no wheezes. He exhibits no tenderness.  + crackles  Abdominal: He exhibits no mass. There is no rebound and no guarding.  Abdomen soft, non-tender, non-distended Bowel sounds positive in all four quadrants  Musculoskeletal: He exhibits no edema.  Neurological: He is alert and oriented to person, place, and time.  Skin: Skin is warm. No rash noted. He is diaphoretic.  Nursing note and vitals reviewed.   ED Course  Procedures (including critical care time) Labs Review Labs Reviewed  BASIC METABOLIC PANEL - Abnormal; Notable for the following:    Glucose, Bld 106 (*)    All other components within normal limits  URINALYSIS, ROUTINE W REFLEX MICROSCOPIC (NOT AT [facility]) - Abnormal; Notable for the following:    Ketones, ur 15 (*)    All other components within normal limits  TROPONIN I - Abnormal; Notable for the following:    Troponin I 0.10 (*)    All other components within normal limits  CBC  CBG MONITORING, ED    Imaging Review Dg Chest 2 View  04/27/2015  CLINICAL DATA:  Productive cough EXAM: CHEST  2 VIEW COMPARISON:   02/05/2015 FINDINGS: Left subclavian AICD device is stable. Right hilar soft tissue fullness is stable. Lungs remain grossly clear. No pneumothorax. No pleural effusion. IMPRESSION: Stable chronic changes.  No active cardiopulmonary disease. Electronically Signed   By: Marybelle Killings M.D.   On: 04/27/2015 13:56   I have personally reviewed and evaluated these images and lab results as part of my medical decision-making.   EKG Interpretation   Date/Time:  Sunday April 27 2015 11:50:27 EST Ventricular Rate:  75 PR Interval:  168 QRS Duration: 106 QT Interval:  464 QTC Calculation: 518 R Axis:   91 Text Interpretation:  Normal sinus rhythm Biatrial enlargement Rightward  axis ST \\T \ Marked T wave abnormality, consider inferior ischemia ST \\T \  Marked T wave abnormality, consider anterolateral ischemia Prolonged QT  Confirmed by Wilson Singer  MD, STEPHEN ([id number]) on 04/27/2015 12:01:22 PM      MDM   Final diagnoses:  Cough  Viral syndrome  Elevated troponin  Roger Martinez presents with cough/congestion/fever x 2 days.  Labs: CBC, BMP, CBG, UA reviewed and are reassuring.  Imaging:CXR shows no PNA, PNX, no effusion.   1:07 PM - Patient very agitated and yelling at staff. Security at bedside. Nursing staff at bedside trying to calm down patient. Temp rechecked, 100.1 - patient sweating profusely - tylenol given. Will order troponin given pts significant cardiac history   2:44 PM - Patient re-evaluated and feels improved. Troponin elevated at 0.1, last troponin was <0.30. Will investigate defibrillator and consult cardiology  3:38 PM - Spoke with Cardiology, Dr. Debara Pickett. Cardiology will see patient.  Heart monitor showed patient had 8 beats of what appears to be Vtach which then resolved.   4:58 PM - Spoke with Dr. Debara Pickett who has examined patient and believed he is okay to be discharged to home in good and stable condition.   Will give hycodan for cough and help patient get some sleep. Return  precautions given. Patient to follow up with cardiologist.   Patient discussed with Dr. Roderic Palau who agrees with treatment plan.   [facility] Kamaury Cutbirth, PA-C 04/27/15 1659  Milton Ferguson, MD 04/29/15 [phone]

## 2015-04-27 NOTE — Consult Note (Signed)
CARDIOLOGY CONSULT NOTE   Patient ID: Roger Martinez MRN: [id number] DOB/AGE: [birth year] 50 y.o.  Admit Date: 04/27/2015 Referring Physician: ER Physician Primary Physician: Candee Furbish, MD Consulting Cardiologist: Quay Burow MD Primary Cardiologist: Candee Furbish MD EP Cardiologist: [name]  Reason for Consultation: NSVT, AICD in situ  Clinical Summary Roger Martinez is a 50 y.o.male with prior history of three-vessel bypass in 2011, coronary artery disease, hypertension here for follow-up.ICD placed. Dr. Lovena Le patient. Patient previously left AMA from hospital on 04/07/13 with new diagnosis of systolic heart failure with ejection fraction of 30-35%. His EF has been repeated and was 20/25 percent. Because of this, defibrillator was placed.  Came to ER with complaints of cough and cold symptoms with fever. He states the main reason he came minutes. He has been having frequent anxiety attacks and has been unable to sleep for 3 months. He states his colas getting better with the help of Corcidin HBP. He states he has been smoking a lot of marijuana to help him to calm down, but it is not keeping him relax enough to sleep. He denies chest pain, he has been having some shortness of breath related to coughing. He denies  ICD pacemaker shock. While in ER on telemetry . He had an 8 beat run of NSVT. During my assessment. The patient states he was having a panic attack than with normal heart rate and BP. He has not currently been followed by psychiatry.  On arrival to the emergency room blood pressure 135/97, heart rate 64, O2 sat 95%, temperature 99.7. Troponin 0.10. Sodium 139, potassium 3.8, serum creatinine 1.16. There was no leukocytosis, he was not found to be anemic. Chest x-ray stable chronic changes with no CHF, or pneumonia. EKG revealed sinus rhythm with inferior lateral T-wave in version, unchanged since October 2016.     No Known Allergies  Medications Scheduled  Medications:    Infusions:    PRN Medications:     Past Medical History  Diagnosis Date  . Coronary artery disease   . Hypertension   . Congestive heart failure ([place])   . Other and unspecified hyperlipidemia   . Anxiety state, unspecified   . MI (myocardial infarction) ([place]) 03/2011  . STD (sexually transmitted disease)     Past Surgical History  Procedure Laterality Date  . Bypass graft  2011    3 vessel  . Ep implantable device  06/17/2014    DR [name]  . Implantable cardioverter defibrillator implant N/A 06/17/2014    Procedure: IMPLANTABLE CARDIOVERTER DEFIBRILLATOR IMPLANT;  Surgeon: Deboraha Sprang, MD;  Location: [facility] CATH LAB;  Service: Cardiovascular;  Laterality: N/A;    Family History  Problem Relation Age of Onset  . Hypertension Mother   . Diabetes Mother     Social History Mr. Web reports that he has been smoking Cigarettes.  He has been smoking about 0.00 packs per day for the past 0 years. He has never used smokeless tobacco. Roger Martinez reports that he does not drink alcohol.  Review of Systems Complete review of systems are found to be negative unless outlined in H&P above.  Physical Examination Blood pressure 159/96, pulse 78, temperature 98.4 F (36.9 C), temperature source Oral, resp. rate 20, height 5\' 11"  (1.803 m), weight 196 lb (88.905 kg), SpO2 98 %.  Intake/Output Summary (Last 24 hours) at 04/27/15 1614 Last data filed at 04/27/15 1434  Gross per 24 hour  Intake   1000 ml  Output  0 ml  Net   1000 ml    Telemetry: Sinus rhythm, heart rate in the 80s.   [id number], coughing frequently, complaining of having a panic attack.  HEENT: Conjunctiva and lids normal, oropharynx clear with moist mucosa. Neck: Supple, no elevated JVP or carotid bruits, no thyromegaly. Lungs: bilateral crackles with frequent coughing. No wheezing.  Cardiac: Regular rate and rhythm, no S3 or significant systolic murmur, no pericardial rub. Abdomen:  Soft, nontender, no hepatomegaly, bowel sounds present, no guarding or rebound. Extremities: No pitting edema, distal pulses 2+. Skin: Warm and dry. Musculoskeletal: No kyphosis. Neuropsychiatric: Alert and oriented x3, anxious.  Prior Cardiac Testing/Procedures 1. ICD Remote Check 03/05/2015 Normal remote reviewed.Next Latitude 05/14/15,follow up with [facility] in [place], [employer] ICD, serial number [id number]   2. Echocardiogram 03/21/2014 Left ventricle: The cavity size was normal. Wall thickness was increased in a pattern of severe LVH. Systolic function was severely reduced. The estimated ejection fraction was in the range of 20% to 25%. Diffuse hypokinesis. Features are consistent with a pseudonormal left ventricular filling pattern, with concomitant abnormal relaxation and increased filling pressure (grade 2 diastolic dysfunction). Doppler parameters are consistent with high ventricular filling pressure. - Aortic valve: There was mild regurgitation. - Left atrium: The atrium was severely dilated. - Right ventricle: The cavity size was mildly dilated. Systolic function was moderately reduced. - Pulmonary arteries: Systolic pressure was moderately to severely increased. PA peak pressure: 59 mm Hg (S).  3.CABG in [place] 2011.   Lab Results  Basic Metabolic Panel:  Recent Labs Lab 04/27/15 1157  NA 139  K 3.8  CL 104  CO2 22  GLUCOSE 106*  BUN 11  CREATININE 1.16  CALCIUM 9.2    CBC:  Recent Labs Lab 04/27/15 1157  WBC 7.1  HGB 16.0  HCT 47.3  MCV 91.0  PLT 200    Cardiac Enzymes:  Recent Labs Lab 04/27/15 1339  TROPONINI 0.10*    Radiology: Dg Chest 2 View  04/27/2015  CLINICAL DATA:  Productive cough EXAM: CHEST  2 VIEW COMPARISON:  02/05/2015 FINDINGS: Left subclavian AICD device is stable. Right hilar soft tissue fullness is stable. Lungs remain grossly clear. No pneumothorax. No pleural effusion.  IMPRESSION: Stable chronic changes.  No active cardiopulmonary disease. Electronically Signed   By: Marybelle Killings M.D.   On: 04/27/2015 13:56     ECG: Sinus rhythm with T-wave inversion in inferior laterally.    Impression and Recommendations 1.Anxiety : Patient has been having a lot of anxiety and insomnia over the last 3 months with recurrent panic attacks. He states that he has not slept for over 24 hours. This is why he came to the emergency room. He denies chest pain, he denies AICD discharge. Recommend psychiatric consultation. Does not require cardiac admission at this time.   2. NSVT: The patient was asymptomatic with this. He is having frequent coughing. This appears to be artifact.  3. Chest: With congestion: Frequent coughing with no relief from over-the-counter medications. May need to be worked up for bronchitis. Patient chest x-ray was negative for CHF or pneumonia. He has had his flu shot. Of note, he smokes a lot of marijuana. This may be contributing to bronchial irritation.  4. ICD in situ: Recently checked in November and functioning appropriately without any discharges. He denies any discharges. Would continue carvedilol 25 mg twice a day .   5. Systolic Dysfunction: EF of 20-25%No evidence of fluid overload or heart failure.  Would continue Lasix and potassium as directed.  6. Hypertension: Blood pressure is stable. Would continue hydralazine and Enteresto as directed.      Signed: Phill Myron. Kendon Sedeno NP [facility]  04/27/2015, 4:14 PM Co-Sign MD

## 2015-04-27 NOTE — ED Notes (Signed)
Pt yelling, ripping ekg cords off. Security called. Pt yelling at police officers. Staff trying to calm pt down.

## 2015-04-27 NOTE — Discharge Instructions (Signed)
Follow up with your cardiologist.  Return to ED for any new or worsening symptoms, any additional concerns.

## 2015-04-27 NOTE — ED Notes (Signed)
Pt yelling in hallway that he has to pee. RN went in room and showed pt that his call bell was in the bed and he needs to use this to call out. Pt said, "Well you're too late I'm going to pee in this cup." RN informed pt this was unacceptable and handed pt urinal. Pt used urinal

## 2015-04-27 NOTE — ED Notes (Signed)
Pt denies cp

## 2015-04-27 NOTE — ED Notes (Signed)
Pt c/o cold symptoms onset Friday. Pt wife gave him some medication for cold but pt does not know what it was. Saturday pt had generalized weakness and has not been able to sleep since.

## 2015-05-05 ENCOUNTER — Telehealth: Payer: Self-pay | Admitting: Cardiology

## 2015-05-05 NOTE — Telephone Encounter (Signed)
Agree with PCP eval.  Candee Furbish, MD

## 2015-05-05 NOTE — Telephone Encounter (Signed)
Pt aware to f/u with PCP or UrgentCare - he states he will do this tomorrow.

## 2015-05-05 NOTE — Telephone Encounter (Signed)
Spoke with pt who is reporting he has been unable to eat or drink anything since Saturday due to anything being taken PO leading to severe "heartburn" and vomiting.  He states he has been taking TUMS but this is not helping. Advised to try Mylanta/Maalox OTC.   He reports he had a case of acid reflux a few months ago and milk helped however he is unable to drink milk at this time.  He has not been taking ANY his medications because of this issue.  His weight (per his report) has gone from 206 lbs in the ED 04/27/2015 to 179 lbs today.  He scheduled an appt for Wednesday with Cecilie Kicks.  Advised pt he should follow up with his PCP or UrgentCare for further evaluation.  He is requesting I forward this information to Dr [name] for other recommendations.

## 2015-05-05 NOTE — Telephone Encounter (Signed)
New message  Pt called states that he hasn't eaten since last Saturday. He says that when he eats he has server chest pains. He says that he needs an appt with Dr. [name]. Pt states that he went to the ER and they gave him cough syrup and he has taken Tums. Pt states that the heart burn is keeping him from holding his food down. No longer has a fever. Request a call back to discuss.   Made appt for 05/07/2015 with laura Dorene Ar

## 2015-05-06 ENCOUNTER — Encounter: Payer: Self-pay | Admitting: Physician Assistant

## 2015-05-06 ENCOUNTER — Ambulatory Visit (INDEPENDENT_AMBULATORY_CARE_PROVIDER_SITE_OTHER): Payer: PRIVATE HEALTH INSURANCE | Admitting: Physician Assistant

## 2015-05-06 ENCOUNTER — Encounter (HOSPITAL_COMMUNITY): Payer: Self-pay | Admitting: Emergency Medicine

## 2015-05-06 ENCOUNTER — Ambulatory Visit (INDEPENDENT_AMBULATORY_CARE_PROVIDER_SITE_OTHER): Payer: PRIVATE HEALTH INSURANCE

## 2015-05-06 ENCOUNTER — Emergency Department (HOSPITAL_COMMUNITY): Payer: PRIVATE HEALTH INSURANCE

## 2015-05-06 ENCOUNTER — Observation Stay (HOSPITAL_COMMUNITY)
Admission: EM | Admit: 2015-05-06 | Discharge: 2015-05-08 | Disposition: A | Discharge status: Home / Self Care | Payer: PRIVATE HEALTH INSURANCE | Attending: Internal Medicine | Admitting: Internal Medicine

## 2015-05-06 VITALS — BP 144/76 | HR 83 | Temp 98.0°F | Resp 14 | Ht 71.0 in | Wt 173.0 lb

## 2015-05-06 DIAGNOSIS — K2100 Gastro-esophageal reflux disease with esophagitis, without bleeding: Secondary | ICD-10-CM

## 2015-05-06 DIAGNOSIS — I110 Hypertensive heart disease with heart failure: Secondary | ICD-10-CM | POA: Insufficient documentation

## 2015-05-06 DIAGNOSIS — K551 Chronic vascular disorders of intestine: Secondary | ICD-10-CM | POA: Diagnosis not present

## 2015-05-06 DIAGNOSIS — E43 Unspecified severe protein-calorie malnutrition: Secondary | ICD-10-CM | POA: Diagnosis not present

## 2015-05-06 DIAGNOSIS — I708 Atherosclerosis of other arteries: Secondary | ICD-10-CM | POA: Diagnosis not present

## 2015-05-06 DIAGNOSIS — R9431 Abnormal electrocardiogram [ECG] [EKG]: Secondary | ICD-10-CM | POA: Diagnosis not present

## 2015-05-06 DIAGNOSIS — R1310 Dysphagia, unspecified: Secondary | ICD-10-CM

## 2015-05-06 DIAGNOSIS — E876 Hypokalemia: Secondary | ICD-10-CM | POA: Diagnosis present

## 2015-05-06 DIAGNOSIS — E785 Hyperlipidemia, unspecified: Secondary | ICD-10-CM | POA: Diagnosis not present

## 2015-05-06 DIAGNOSIS — E860 Dehydration: Secondary | ICD-10-CM | POA: Diagnosis not present

## 2015-05-06 DIAGNOSIS — Z95810 Presence of automatic (implantable) cardiac defibrillator: Secondary | ICD-10-CM | POA: Insufficient documentation

## 2015-05-06 DIAGNOSIS — R079 Chest pain, unspecified: Secondary | ICD-10-CM | POA: Diagnosis not present

## 2015-05-06 DIAGNOSIS — Z951 Presence of aortocoronary bypass graft: Secondary | ICD-10-CM | POA: Diagnosis not present

## 2015-05-06 DIAGNOSIS — I2510 Atherosclerotic heart disease of native coronary artery without angina pectoris: Secondary | ICD-10-CM | POA: Diagnosis not present

## 2015-05-06 DIAGNOSIS — Z79899 Other long term (current) drug therapy: Secondary | ICD-10-CM | POA: Insufficient documentation

## 2015-05-06 DIAGNOSIS — N400 Enlarged prostate without lower urinary tract symptoms: Secondary | ICD-10-CM | POA: Diagnosis not present

## 2015-05-06 DIAGNOSIS — R748 Abnormal levels of other serum enzymes: Secondary | ICD-10-CM | POA: Insufficient documentation

## 2015-05-06 DIAGNOSIS — R5383 Other fatigue: Secondary | ICD-10-CM

## 2015-05-06 DIAGNOSIS — I252 Old myocardial infarction: Secondary | ICD-10-CM | POA: Diagnosis not present

## 2015-05-06 DIAGNOSIS — R634 Abnormal weight loss: Secondary | ICD-10-CM | POA: Diagnosis not present

## 2015-05-06 DIAGNOSIS — R700 Elevated erythrocyte sedimentation rate: Secondary | ICD-10-CM

## 2015-05-06 DIAGNOSIS — Z7982 Long term (current) use of aspirin: Secondary | ICD-10-CM | POA: Diagnosis not present

## 2015-05-06 DIAGNOSIS — R12 Heartburn: Secondary | ICD-10-CM | POA: Insufficient documentation

## 2015-05-06 DIAGNOSIS — F17210 Nicotine dependence, cigarettes, uncomplicated: Secondary | ICD-10-CM | POA: Diagnosis not present

## 2015-05-06 DIAGNOSIS — Z9114 Patient's other noncompliance with medication regimen: Secondary | ICD-10-CM | POA: Insufficient documentation

## 2015-05-06 DIAGNOSIS — I255 Ischemic cardiomyopathy: Secondary | ICD-10-CM | POA: Diagnosis not present

## 2015-05-06 DIAGNOSIS — Z6825 Body mass index (BMI) 25.0-25.9, adult: Secondary | ICD-10-CM | POA: Diagnosis not present

## 2015-05-06 DIAGNOSIS — R1110 Vomiting, unspecified: Secondary | ICD-10-CM

## 2015-05-06 DIAGNOSIS — J189 Pneumonia, unspecified organism: Secondary | ICD-10-CM | POA: Diagnosis not present

## 2015-05-06 DIAGNOSIS — R112 Nausea with vomiting, unspecified: Principal | ICD-10-CM | POA: Insufficient documentation

## 2015-05-06 DIAGNOSIS — I700 Atherosclerosis of aorta: Secondary | ICD-10-CM | POA: Diagnosis not present

## 2015-05-06 DIAGNOSIS — F1210 Cannabis abuse, uncomplicated: Secondary | ICD-10-CM | POA: Diagnosis not present

## 2015-05-06 DIAGNOSIS — I5042 Chronic combined systolic (congestive) and diastolic (congestive) heart failure: Secondary | ICD-10-CM | POA: Insufficient documentation

## 2015-05-06 DIAGNOSIS — Z720 Tobacco use: Secondary | ICD-10-CM | POA: Diagnosis present

## 2015-05-06 HISTORY — DX: Tobacco use: Z72.0

## 2015-05-06 HISTORY — DX: Hyperlipidemia, unspecified: E78.5

## 2015-05-06 HISTORY — DX: Cannabis abuse, uncomplicated: F12.10

## 2015-05-06 HISTORY — DX: Patient's noncompliance with other medical treatment and regimen: Z91.19

## 2015-05-06 HISTORY — DX: Chronic combined systolic (congestive) and diastolic (congestive) heart failure: I50.42

## 2015-05-06 HISTORY — DX: Patient's noncompliance with other medical treatment and regimen due to unspecified reason: Z91.199

## 2015-05-06 HISTORY — DX: Hypertensive heart disease with heart failure: I11.0

## 2015-05-06 HISTORY — DX: Ischemic cardiomyopathy: I25.5

## 2015-05-06 LAB — CBC WITH DIFFERENTIAL/PLATELET
BASOS ABS: 0 10*3/uL (ref 0.0–0.1)
Basophils Relative: 0 %
EOS ABS: 0.2 10*3/uL (ref 0.0–0.7)
Eosinophils Relative: 2 %
HCT: 44.8 % (ref 39.0–52.0)
HEMOGLOBIN: 15.4 g/dL (ref 13.0–17.0)
LYMPHS PCT: 25 %
Lymphs Abs: 2.4 10*3/uL (ref 0.7–4.0)
MCH: 31 pg (ref 26.0–34.0)
MCHC: 34.4 g/dL (ref 30.0–36.0)
MCV: 90.3 fL (ref 78.0–100.0)
Monocytes Absolute: 1.5 10*3/uL — ABNORMAL HIGH (ref 0.1–1.0)
Monocytes Relative: 16 %
NEUTROS PCT: 57 %
Neutro Abs: 5.5 10*3/uL (ref 1.7–7.7)
Platelets: 357 10*3/uL (ref 150–400)
RBC: 4.96 MIL/uL (ref 4.22–5.81)
RDW: 13 % (ref 11.5–15.5)
WBC: 9.6 10*3/uL (ref 4.0–10.5)

## 2015-05-06 LAB — COMPREHENSIVE METABOLIC PANEL
ALT: 11 U/L — ABNORMAL LOW (ref 17–63)
AST: 22 U/L (ref 15–41)
Albumin: 3.1 g/dL — ABNORMAL LOW (ref 3.5–5.0)
Alkaline Phosphatase: 66 U/L (ref 38–126)
Anion gap: 13 (ref 5–15)
BUN: 31 mg/dL — AB (ref 6–20)
CHLORIDE: 108 mmol/L (ref 101–111)
CO2: 23 mmol/L (ref 22–32)
CREATININE: 1.32 mg/dL — AB (ref 0.61–1.24)
Calcium: 8.8 mg/dL — ABNORMAL LOW (ref 8.9–10.3)
GFR calc non Af Amer: >60 mL/min (ref >60)
Glucose, Bld: 88 mg/dL (ref 65–99)
Potassium: 3.2 mmol/L — ABNORMAL LOW (ref 3.5–5.1)
SODIUM: 144 mmol/L (ref 135–145)
Total Bilirubin: 1 mg/dL (ref 0.3–1.2)
Total Protein: 7.2 g/dL (ref 6.5–8.1)

## 2015-05-06 LAB — POCT CBC
Granulocyte percent: 64.6 %G (ref 37–80)
HCT, POC: 52.2 % (ref 43.5–53.7)
Hemoglobin: 17.9 g/dL (ref 14.1–18.1)
LYMPH, POC: 2.9 (ref 0.6–3.4)
MCH: 30.5 pg (ref 27–31.2)
MCHC: 34.4 g/dL (ref 31.8–35.4)
MCV: 88.6 fL (ref 80–97)
MID (CBC): 0.9 (ref 0–0.9)
MPV: 7.7 fL (ref 0–99.8)
POC Granulocyte: 6.8 (ref 2–6.9)
POC LYMPH %: 27.2 % (ref 10–50)
POC MID %: 8.2 % (ref 0–12)
Platelet Count, POC: 394 10*3/uL (ref 142–424)
RBC: 5.89 M/uL (ref 4.69–6.13)
RDW, POC: 13.7 %
WBC: 10.5 10*3/uL — AB (ref 4.6–10.2)

## 2015-05-06 LAB — POCT URINALYSIS DIP (MANUAL ENTRY)
Glucose, UA: NEGATIVE
Ketones, POC UA: NEGATIVE
Leukocytes, UA: NEGATIVE
Nitrite, UA: NEGATIVE
RBC UA: NEGATIVE
UROBILINOGEN UA: 0.2
pH, UA: 5

## 2015-05-06 LAB — POCT GLYCOSYLATED HEMOGLOBIN (HGB A1C): Hemoglobin A1C: 5.9

## 2015-05-06 LAB — I-STAT TROPONIN, ED: Troponin i, poc: 0.06 ng/mL (ref 0.00–0.08)

## 2015-05-06 LAB — LIPASE, BLOOD: Lipase: 17 U/L (ref 11–51)

## 2015-05-06 LAB — POCT SEDIMENTATION RATE: POCT SED RATE: 71 mm/hr — AB (ref 0–22)

## 2015-05-06 LAB — GLUCOSE, POCT (MANUAL RESULT ENTRY): POC GLUCOSE: 101 mg/dL — AB (ref 70–99)

## 2015-05-06 LAB — I-STAT CG4 LACTIC ACID, ED: LACTIC ACID, VENOUS: 1.36 mmol/L (ref 0.5–2.0)

## 2015-05-06 MED ORDER — ONDANSETRON HCL 4 MG/2ML IJ SOLN
2.0000 mg | Freq: Once | INTRAMUSCULAR | Status: AC
  Administered 2015-05-06: 2 mg via INTRAMUSCULAR

## 2015-05-06 MED ORDER — ONDANSETRON 4 MG PO TBDP
4.0000 mg | ORAL_TABLET | Freq: Once | ORAL | Status: AC
  Administered 2015-05-06: 4 mg via ORAL

## 2015-05-06 MED ORDER — PROMETHAZINE HCL 25 MG/ML IJ SOLN
25.0000 mg | Freq: Once | INTRAMUSCULAR | Status: AC
  Administered 2015-05-06: 25 mg via INTRAVENOUS
  Filled 2015-05-06: qty 1

## 2015-05-06 MED ORDER — GI COCKTAIL ~~LOC~~
10.0000 mL | Freq: Once | ORAL | Status: AC
  Administered 2015-05-06: 10 mL via ORAL

## 2015-05-06 NOTE — ED Notes (Signed)
Patient transported to X-ray 

## 2015-05-06 NOTE — Progress Notes (Signed)
05/06/2015 8:57 PM   DOB: 12-02-1965 / MRN: [id number]  SUBJECTIVE:  Roger Martinez is a 50 y.o. male presenting for weakness that started 1.5 weeks ago along with heart burn which lead to nausea and inability to eat or drink and says he has lost over 20 lbs since becoming ill.  Reports he has been losing weight.    He has had two heart attacks and is s/p CABG in 2011.  Denies medication changes. Heart burn is somewhat improved with OTC mylanta. Reports he is smoking marijuana but this is not new for him, and smokes roughly 1 blount every other day.  Denies belly pain at this time.  Has been unable to take his medications since he became ill.  Does report one bout of BRBPR roughly 1 week ago, however this seems to have resolved.   CHL reveal most recent EF at 20-25% in 03/21/14.    He has No Known Allergies.   He  has a past medical history of Coronary artery disease; Hypertension; Congestive heart failure ([place]); Other and unspecified hyperlipidemia; Anxiety state, unspecified; MI (myocardial infarction) ([place]) (03/2011); and STD (sexually transmitted disease).    He  reports that he has been smoking Cigarettes.  He has been smoking about 0.00 packs per day for the past 0 years. He has never used smokeless tobacco. He reports that he does not drink alcohol or use illicit drugs. He  has no sexual activity history on file. The patient  has past surgical history that includes Bypass Graft (2011); Cardiac catheterization (06/17/2014); and implantable cardioverter defibrillator implant (N/A, 06/17/2014).  His family history includes Diabetes in his mother; Hypertension in his mother.  Review of Systems  Constitutional: Positive for weight loss and malaise/fatigue. Negative for fever and chills.  Respiratory: Negative for cough.   Cardiovascular: Negative for chest pain.  Gastrointestinal: Positive for heartburn, nausea, vomiting and blood in stool. Negative for diarrhea, constipation and melena.    Musculoskeletal: Negative for myalgias.  Skin: Negative for itching and rash.  Neurological: Positive for weakness. Negative for dizziness and headaches.    Problem list and medications reviewed and updated by myself where necessary, and exist elsewhere in the encounter.   OBJECTIVE:  BP 144/76 mmHg  Pulse 83  Temp(Src) 98 F (36.7 C) (Oral)  Resp 14  Ht 5\' 11"  (1.803 m)  Wt 173 lb (78.472 kg)  BMI 24.14 kg/m2  SpO2 95%  Filed Vitals:   05/06/15 1901 05/06/15 2001  BP: 138/100 144/76  Pulse: 102 83  Temp: 98 F (36.7 C)   TempSrc: Oral   Resp:  14  Height: 5\' 11"  (1.803 m)   Weight: 173 lb (78.472 kg)   SpO2: 98% 95%     Physical Exam  Constitutional: He is oriented to person, place, and time.  Cardiovascular: Normal rate and regular rhythm.   Pulmonary/Chest: Effort normal and breath sounds normal.  Abdominal: Soft. He exhibits no mass. There is tenderness (RUQ). There is no rebound and no guarding.  Neurological: He is alert and oriented to person, place, and time.  Skin: Skin is warm and dry.    Results for orders placed or performed in visit on 05/06/15 (from the past 72 hour(s))  POCT CBC     Status: Abnormal   Collection Time: 05/06/15  7:23 PM  Result Value Ref Range   WBC 10.5 (A) 4.6 - 10.2 K/uL   Lymph, poc 2.9 0.6 - 3.4   POC LYMPH PERCENT 27.2 10 -  50 %L   MID (cbc) 0.9 0 - 0.9   POC MID % 8.2 0 - 12 %M   POC Granulocyte 6.8 2 - 6.9   Granulocyte percent 64.6 37 - 80 %G   RBC 5.89 4.69 - 6.13 M/uL   Hemoglobin 17.9 14.1 - 18.1 g/dL   HCT, POC 52.2 43.5 - 53.7 %   MCV 88.6 80 - 97 fL   MCH, POC 30.5 27 - 31.2 pg   MCHC 34.4 31.8 - 35.4 g/dL   RDW, POC 13.7 %   Platelet Count, POC 394 142 - 424 K/uL   MPV 7.7 0 - 99.8 fL  POCT urinalysis dipstick     Status: Abnormal   Collection Time: 05/06/15  7:55 PM  Result Value Ref Range   Color, UA orange (A) yellow   Clarity, UA clear clear   Glucose, UA negative negative   Bilirubin, UA moderate  (A) negative   Ketones, POC UA negative negative   Spec Grav, UA >=1.030    Blood, UA negative negative   pH, UA 5.0    Protein Ur, POC =100 (A) negative   Urobilinogen, UA 0.2    Nitrite, UA Negative Negative   Leukocytes, UA Negative Negative  Glucose (CBG)     Status: Abnormal   Collection Time: 05/06/15  7:55 PM  Result Value Ref Range   POC Glucose 101 (A) 70 - 99 mg/dl  POCT glycosylated hemoglobin (Hb A1C)     Status: None   Collection Time: 05/06/15  7:55 PM  Result Value Ref Range   Hemoglobin A1C 5.9   POCT SEDIMENTATION RATE     Status: Abnormal   Collection Time: 05/06/15  8:24 PM  Result Value Ref Range   POCT SED RATE 71 (A) 0 - 22 mm/hr   Lab Results  Component Value Date   CREATININE 1.16 04/27/2015   CREATININE 1.10 02/05/2015   CREATININE 1.22 12/10/2014   Lab Results  Component Value Date   HGBA1C 5.9 05/06/2015      ASSESSMENT AND PLAN  Zahki was seen today for emesis. He has lost 23 lbs in 9 days.  He can not eat or take his medications.  Sed rate at 73.  Chest and abdominal rads clear.  Will send him to the hospital for further evaluation.    Diagnoses and all orders for this visit:  Other fatigue -     POCT CBC -     COMPLETE METABOLIC PANEL WITH GFR -     Vital signs -     ondansetron (ZOFRAN-ODT) disintegrating tablet 4 mg; Take 1 tablet (4 mg total) by mouth once. -     POCT urinalysis dipstick -     Glucose (CBG) -     EKG 12-Lead -     Brain natriuretic peptide -     DG Abd 2 Views; Future -     ondansetron (ZOFRAN) injection 2 mg; Inject 1 mL (2 mg total) into the muscle once. -     gi cocktail (Maalox,Lidocaine,Donnatal); Take 10 mLs by mouth once.  Loss of weight: I am concerned for an occult oncological process given his rapid weight loss and elevated sed rate. Recent chest rads and now abdominal radiographs negative.  He is not able to tolerate po fluids despite IM Zofran and GI cocktail.  He may benefit from a scope given he has  been having GERD like symptoms.  Will send him to [facility] for further evaluation  and management.  -     POCT SEDIMENTATION RATE -     HIV antibody -     POCT glycosylated hemoglobin (Hb A1C)  Elevated sed rate: Managed with the above problem.   Nausea and vomiting, intractability of vomiting not specified, unspecified vomiting type: Managed with the above.   Dehydration: Managed with the above.      The patient was advised to call or return to clinic if he does not see an improvement in symptoms or to seek the care of the closest emergency department if he worsens with the above plan.   Philis Fendt, MHS, PA-C [facility] 05/06/2015 8:57 PM

## 2015-05-06 NOTE — ED Notes (Signed)
Nurse drawing labs. 

## 2015-05-06 NOTE — ED Notes (Addendum)
Patient comes in from [facility] after becoming concerned after losing 30lbs in 2 weeks. Weakness. States he hasn't been eating. Alert and oriented. Hx of CABG in march. Elevated sed rate. 1 liter bolus given PTA.

## 2015-05-06 NOTE — ED Provider Notes (Signed)
CSN: [id number]     Arrival date & time 05/06/15  2134 History   First MD Initiated Contact with Patient 05/06/15 2230     Chief Complaint  Patient presents with  . Dehydration     (Consider location/radiation/quality/duration/timing/severity/associated sxs/prior Treatment) HPI Roger Martinez is a 50 y.o. male with hx of CAD, HTN, CHF, MI, presents to ED with complaint of nausea, vomiting, anorexia, weakness. Patient reports symptoms began in 11 days ago. He reports flulike symptoms at that time including nasal congestion, cough, weakness, body aches. He was seen in emergency department 9 days ago. Patient was febrile when he was seen, had unremarkable workup.  Records reviewed. Patient states since then he continues to have cough, he also continues to have persistent nausea and vomiting, weakness, unable to keep any fluids or solids down. Patient states he hasn't eaten any solid food in the last 11 days. He has been trying to drink water but most the time he states is coming right back up. He states he has not had any pain until this evening when he developed severe epigastric pain. This made him go to an urgent care. At an urgent care it was found that patient had significant weight loss, approximately 30 pounds in 2 weeks. He did have a documented 10 kg weight loss since his visit to emergency department 9 days ago. Patient was given Zofran there, despite treatment, he continued to have nausea and vomiting. Patient states that his pain resolved while at the urgent care. He states he is having really hard time walking due to dizziness and weakness. Had elevated sedimentation rate in urgent care. He was transferred here for further workup.  Past Medical History  Diagnosis Date  . Coronary artery disease   . Hypertension   . Congestive heart failure ([place])   . Other and unspecified hyperlipidemia   . Anxiety state, unspecified   . MI (myocardial infarction) ([place]) 03/2011  . STD (sexually  transmitted disease)    Past Surgical History  Procedure Laterality Date  . Bypass graft  2011    3 vessel  . Ep implantable device  06/17/2014    DR [name]  . Implantable cardioverter defibrillator implant N/A 06/17/2014    Procedure: IMPLANTABLE CARDIOVERTER DEFIBRILLATOR IMPLANT;  Surgeon: Deboraha Sprang, MD;  Location: [facility] CATH LAB;  Service: Cardiovascular;  Laterality: N/A;   Family History  Problem Relation Age of Onset  . Hypertension Mother   . Diabetes Mother    Social History  Substance Use Topics  . Smoking status: Current Every Day Smoker -- 0.00 packs/day for 0 years    Types: Cigarettes  . Smokeless tobacco: Never Used  . Alcohol Use: No     Comment: ocassionaly    Review of Systems  Constitutional: Positive for activity change, appetite change and fatigue. Negative for fever and chills.  Respiratory: Positive for cough. Negative for chest tightness and shortness of breath.   Cardiovascular: Negative for chest pain, palpitations and leg swelling.  Gastrointestinal: Positive for nausea, vomiting and abdominal pain. Negative for diarrhea and abdominal distention.  Genitourinary: Negative for dysuria, urgency, frequency and hematuria.  Musculoskeletal: Negative for myalgias, arthralgias, neck pain and neck stiffness.  Skin: Negative for rash.  Allergic/Immunologic: Negative for immunocompromised state.  Neurological: Positive for dizziness, weakness and light-headedness. Negative for numbness and headaches.  All other systems reviewed and are negative.     Allergies  Review of patient's allergies indicates no known allergies.  Home Medications   Prior  to Admission medications   Medication Sig Start Date End Date Taking? Authorizing Provider  aspirin EC 81 MG tablet Take 81 mg by mouth every 3 (three) days.     Historical Provider, MD  carvedilol (COREG) 25 MG tablet Take 1 tablet (25 mg total) by mouth 2 (two) times daily with a meal. Patient taking  differently: Take 25 mg by mouth every 3 (three) days.  06/14/14   Jerline Pain, MD  furosemide (LASIX) 40 MG tablet Take 1 tablet (40 mg total) by mouth 2 (two) times daily. Patient taking differently: Take 40 mg by mouth every 3 (three) days.  06/14/14   Jerline Pain, MD  hydrALAZINE (APRESOLINE) 50 MG tablet Take 50 mg by mouth every 3 (three) days.     Historical Provider, MD  HYDROcodone-homatropine (HYCODAN) 5-1.5 MG/5ML syrup Take 5 mLs by mouth at bedtime as needed for cough. 04/27/15   Jaime Pilcher Ward, PA-C  NITROSTAT 0.4 MG SL tablet PLACE ONE TABLET UNDER THE TONGUE EVERY 5 MINUTES AS NEEDED FOR CHEST PAIN. 04/04/15   Jerline Pain, MD  potassium chloride SA (K-DUR,KLOR-CON) 20 MEQ tablet Take 20 mEq by mouth every 3 (three) days.     Historical Provider, MD  sacubitril-valsartan (ENTRESTO) 24-26 MG Take 1 tablet by mouth 2 (two) times daily. Take 1 tablet by mouth every Monday, Wednesday, and Friday. Patient taking differently: Take 1 tablet by mouth every 3 (three) days.  03/18/15   Jerline Pain, MD  sildenafil (VIAGRA) 100 MG tablet Take 1 tablet (100 mg total) by mouth daily as needed for erectile dysfunction. 03/18/15   Jerline Pain, MD  simvastatin (ZOCOR) 20 MG tablet Take 20 mg by mouth every 3 (three) days.     Historical Provider, MD   BP 156/104 mmHg  Pulse 69  Temp(Src) 98.4 F (36.9 C) (Oral)  Resp 16  SpO2 95% Physical Exam  Constitutional: He is oriented to person, place, and time. He appears well-developed and well-nourished. No distress.  HENT:  Head: Normocephalic and atraumatic.  Eyes: Conjunctivae are normal.  Neck: Neck supple.  Cardiovascular: Normal rate, regular rhythm and normal heart sounds.   Pulmonary/Chest: Effort normal. No respiratory distress. He has no wheezes. He has no rales.  Abdominal: Soft. Bowel sounds are normal. He exhibits no distension. There is no tenderness. There is no rebound and no guarding.  Musculoskeletal: He exhibits no edema.   Neurological: He is alert and oriented to person, place, and time.  Skin: Skin is warm and dry.  Nursing note and vitals reviewed.   ED Course  Procedures (including critical care time) Labs Review Labs Reviewed  CBC WITH DIFFERENTIAL/PLATELET - Abnormal; Notable for the following:    Monocytes Absolute 1.5 (*)    All other components within normal limits  COMPREHENSIVE METABOLIC PANEL - Abnormal; Notable for the following:    Potassium 3.2 (*)    BUN 31 (*)    Creatinine, Ser 1.32 (*)    Calcium 8.8 (*)    Albumin 3.1 (*)    ALT 11 (*)    All other components within normal limits  LIPASE, BLOOD  URINALYSIS, ROUTINE W REFLEX MICROSCOPIC (NOT AT [facility])  I-STAT CG4 LACTIC ACID, ED  Randolm Idol, ED    Imaging Review Dg Abd 2 Views  05/06/2015  CLINICAL DATA:  Nausea and vomiting today.  Initial encounter. EXAM: ABDOMEN - 2 VIEW COMPARISON:  None. FINDINGS: The bowel gas pattern is normal. There is no  evidence of free air. No radio-opaque calculi or other significant radiographic abnormality is seen. Scoliosis statement lead from an AICD are noted. IMPRESSION: Negative exam. Electronically Signed   By: Inge Rise M.D.   On: 05/06/2015 20:29   I have personally reviewed and evaluated these images and lab results as part of my medical decision-making.   EKG Interpretation   Date/Time:  Tuesday May 06 2015 21:41:49 EST Ventricular Rate:  73 PR Interval:  170 QRS Duration: 121 QT Interval:  524 QTC Calculation: 577 R Axis:   132 Text Interpretation:  Ventricular premature complex Left atrial  enlargement Consider left ventricular hypertrophy Marked T wave  abnormality, consider anterolateral ischemia Marked T wave abnormality,  consider inferior ischemia Prolonged QT interval No significant change  since last tracing 04/27/2015 Confirmed by KNOTT MD, Quillian Quince [phone]) on  05/06/2015 10:25:08 PM      MDM   Final diagnoses:  CAP (community acquired pneumonia)   Intractable vomiting with nausea, vomiting of unspecified type    patient emergency department with generalized weakness, unable to keep anything down, 30 pound weight loss in 2 weeks. Patient does have significant history of heart disease. He received 1 L of IV fluids prior to coming in. His vital signs are normal except for mild hypertension. We'll get orthostatics, labs, chest x-ray. Will monitor.  12:46 AM Chest x-ray negative other than cardiomegaly. CT scan results as showing possible pneumonia. Given patient's cough and reported fever, and temperature 100.1 on prior visit here, will start on Rocephin and Zithromax. Patient is carefully hydrated here, so far received 1 L. I also discussed with patient his marijuana use, and considering as possible cause of his cyclical vomiting. Patient continues to have nausea despite medications, will admit him for intractable nausea vomiting and treatment of pneumonia. He is unable to keep medications down at home, and I'm concerned about him keeping his antibiotics down. Spoke with the admitting hospitalist who accepts the patient.  Filed Vitals:   05/06/15 2142 05/06/15 2339  BP: 156/104 139/99  Pulse: 69 95  Temp: 98.4 F (36.9 C)   TempSrc: Oral   Resp: 16 24  SpO2: 95% 98%     Jeannett Senior, PA-C 05/07/15 [place], DO 05/07/15 1644

## 2015-05-06 NOTE — ED Notes (Signed)
Bed: WA07 Expected date:  Expected time:  Means of arrival:  Comments: EMS from Urgent Care

## 2015-05-07 ENCOUNTER — Ambulatory Visit: Payer: PRIVATE HEALTH INSURANCE | Admitting: Cardiology

## 2015-05-07 ENCOUNTER — Encounter (HOSPITAL_COMMUNITY): Payer: Self-pay | Admitting: Internal Medicine

## 2015-05-07 DIAGNOSIS — I110 Hypertensive heart disease with heart failure: Secondary | ICD-10-CM | POA: Diagnosis present

## 2015-05-07 DIAGNOSIS — I5042 Chronic combined systolic (congestive) and diastolic (congestive) heart failure: Secondary | ICD-10-CM | POA: Diagnosis not present

## 2015-05-07 DIAGNOSIS — G43A1 Cyclical vomiting, intractable: Secondary | ICD-10-CM

## 2015-05-07 DIAGNOSIS — R1110 Vomiting, unspecified: Secondary | ICD-10-CM

## 2015-05-07 DIAGNOSIS — Z9119 Patient's noncompliance with other medical treatment and regimen: Secondary | ICD-10-CM

## 2015-05-07 DIAGNOSIS — E876 Hypokalemia: Secondary | ICD-10-CM | POA: Diagnosis present

## 2015-05-07 DIAGNOSIS — R112 Nausea with vomiting, unspecified: Secondary | ICD-10-CM | POA: Diagnosis present

## 2015-05-07 DIAGNOSIS — Z720 Tobacco use: Secondary | ICD-10-CM | POA: Diagnosis present

## 2015-05-07 DIAGNOSIS — I2510 Atherosclerotic heart disease of native coronary artery without angina pectoris: Secondary | ICD-10-CM | POA: Diagnosis not present

## 2015-05-07 DIAGNOSIS — E860 Dehydration: Secondary | ICD-10-CM | POA: Diagnosis present

## 2015-05-07 DIAGNOSIS — F1210 Cannabis abuse, uncomplicated: Secondary | ICD-10-CM | POA: Diagnosis present

## 2015-05-07 DIAGNOSIS — J189 Pneumonia, unspecified organism: Secondary | ICD-10-CM | POA: Diagnosis present

## 2015-05-07 DIAGNOSIS — E43 Unspecified severe protein-calorie malnutrition: Secondary | ICD-10-CM | POA: Insufficient documentation

## 2015-05-07 DIAGNOSIS — E785 Hyperlipidemia, unspecified: Secondary | ICD-10-CM

## 2015-05-07 LAB — COMPLETE METABOLIC PANEL WITH GFR
ALT: 8 U/L — ABNORMAL LOW (ref 9–46)
AST: 22 U/L (ref 10–40)
Albumin: 4.1 g/dL (ref 3.6–5.1)
Alkaline Phosphatase: 87 U/L (ref 40–115)
BILIRUBIN TOTAL: 0.9 mg/dL (ref 0.2–1.2)
BUN: 32 mg/dL — AB (ref 7–25)
CO2: 25 mmol/L (ref 20–31)
Calcium: 10 mg/dL (ref 8.6–10.3)
Chloride: 99 mmol/L (ref 98–110)
Creat: 1.44 mg/dL — ABNORMAL HIGH (ref 0.60–1.35)
GFR, EST NON AFRICAN AMERICAN: 57 mL/min — AB (ref >=60)
GFR, Est African American: 66 mL/min (ref >=60)
GLUCOSE: 105 mg/dL — AB (ref 65–99)
Potassium: 3.6 mmol/L (ref 3.5–5.3)
SODIUM: 137 mmol/L (ref 135–146)
TOTAL PROTEIN: 8.7 g/dL — AB (ref 6.1–8.1)

## 2015-05-07 LAB — RAPID URINE DRUG SCREEN, HOSP PERFORMED
Amphetamines: NOT DETECTED
Barbiturates: NOT DETECTED
Benzodiazepines: POSITIVE — AB
COCAINE: NOT DETECTED
OPIATES: NOT DETECTED
TETRAHYDROCANNABINOL: POSITIVE — AB

## 2015-05-07 LAB — COMPREHENSIVE METABOLIC PANEL
ALT: 11 U/L — ABNORMAL LOW (ref 17–63)
AST: 22 U/L (ref 15–41)
Albumin: 3.4 g/dL — ABNORMAL LOW (ref 3.5–5.0)
Alkaline Phosphatase: 67 U/L (ref 38–126)
Anion gap: 8 (ref 5–15)
BILIRUBIN TOTAL: 1 mg/dL (ref 0.3–1.2)
BUN: 27 mg/dL — AB (ref 6–20)
CALCIUM: 9 mg/dL (ref 8.9–10.3)
CO2: 24 mmol/L (ref 22–32)
CREATININE: 1.07 mg/dL (ref 0.61–1.24)
Chloride: 110 mmol/L (ref 101–111)
GFR calc Af Amer: >60 mL/min (ref >60)
GLUCOSE: 89 mg/dL (ref 65–99)
POTASSIUM: 3.3 mmol/L — AB (ref 3.5–5.1)
SODIUM: 142 mmol/L (ref 135–145)
TOTAL PROTEIN: 7.2 g/dL (ref 6.5–8.1)

## 2015-05-07 LAB — HIV ANTIBODY (ROUTINE TESTING W REFLEX): HIV 1&2 Ab, 4th Generation: NONREACTIVE

## 2015-05-07 LAB — CBC WITH DIFFERENTIAL/PLATELET
BASOS ABS: 0 10*3/uL (ref 0.0–0.1)
BASOS PCT: 0 %
EOS ABS: 0.2 10*3/uL (ref 0.0–0.7)
EOS PCT: 2 %
HCT: 43.8 % (ref 39.0–52.0)
Hemoglobin: 14.4 g/dL (ref 13.0–17.0)
Lymphocytes Relative: 27 %
Lymphs Abs: 2.1 10*3/uL (ref 0.7–4.0)
MCH: 30.4 pg (ref 26.0–34.0)
MCHC: 32.9 g/dL (ref 30.0–36.0)
MCV: 92.6 fL (ref 78.0–100.0)
MONO ABS: 1.5 10*3/uL — AB (ref 0.1–1.0)
Monocytes Relative: 18 %
Neutro Abs: 4.2 10*3/uL (ref 1.7–7.7)
Neutrophils Relative %: 53 %
PLATELETS: 361 10*3/uL (ref 150–400)
RBC: 4.73 MIL/uL (ref 4.22–5.81)
RDW: 13.1 % (ref 11.5–15.5)
WBC: 8 10*3/uL (ref 4.0–10.5)

## 2015-05-07 LAB — URINALYSIS, ROUTINE W REFLEX MICROSCOPIC
BILIRUBIN URINE: NEGATIVE
GLUCOSE, UA: NEGATIVE mg/dL
HGB URINE DIPSTICK: NEGATIVE
KETONES UR: NEGATIVE mg/dL
LEUKOCYTES UA: NEGATIVE
Nitrite: NEGATIVE
PROTEIN: NEGATIVE mg/dL
Specific Gravity, Urine: >1.046 — ABNORMAL HIGH (ref 1.005–1.030)
pH: 5 (ref 5.0–8.0)

## 2015-05-07 LAB — MAGNESIUM: MAGNESIUM: 2.1 mg/dL (ref 1.7–2.4)

## 2015-05-07 LAB — TROPONIN I
TROPONIN I: 0.08 ng/mL — AB (ref <0.031)
TROPONIN I: 0.08 ng/mL — AB (ref <0.031)
Troponin I: 0.08 ng/mL — ABNORMAL HIGH (ref <0.031)

## 2015-05-07 LAB — PHOSPHORUS: Phosphorus: 2.6 mg/dL (ref 2.5–4.6)

## 2015-05-07 LAB — STREP PNEUMONIAE URINARY ANTIGEN: STREP PNEUMO URINARY ANTIGEN: NEGATIVE

## 2015-05-07 LAB — BRAIN NATRIURETIC PEPTIDE: Brain Natriuretic Peptide: 334.4 pg/mL — ABNORMAL HIGH (ref 0.0–100.0)

## 2015-05-07 MED ORDER — CLONIDINE HCL 0.1 MG PO TABS
0.1000 mg | ORAL_TABLET | Freq: Once | ORAL | Status: AC
  Administered 2015-05-07: 0.1 mg via ORAL
  Filled 2015-05-07: qty 1

## 2015-05-07 MED ORDER — IOHEXOL 300 MG/ML  SOLN
25.0000 mL | Freq: Once | INTRAMUSCULAR | Status: AC | PRN
  Administered 2015-05-06: 25 mL via ORAL

## 2015-05-07 MED ORDER — PANTOPRAZOLE SODIUM 40 MG IV SOLR
40.0000 mg | Freq: Once | INTRAVENOUS | Status: AC
  Administered 2015-05-07: 40 mg via INTRAVENOUS
  Filled 2015-05-07: qty 40

## 2015-05-07 MED ORDER — ACETAMINOPHEN 650 MG RE SUPP: 650.0000 mg | Freq: Four times a day (QID) | RECTAL | Status: DC | PRN

## 2015-05-07 MED ORDER — GI COCKTAIL ~~LOC~~
30.0000 mL | Freq: Three times a day (TID) | ORAL | Status: DC | PRN
  Administered 2015-05-07: 30 mL via ORAL
  Filled 2015-05-07: qty 30

## 2015-05-07 MED ORDER — POTASSIUM CHLORIDE IN NACL 40-0.9 MEQ/L-% IV SOLN
INTRAVENOUS | Status: AC
  Administered 2015-05-07 (×2): 100 mL/h via INTRAVENOUS
  Filled 2015-05-07 (×2): qty 1000

## 2015-05-07 MED ORDER — CEFTRIAXONE SODIUM 1 G IJ SOLR
1.0000 g | Freq: Once | INTRAMUSCULAR | Status: AC
  Administered 2015-05-07: 1 g via INTRAVENOUS
  Filled 2015-05-07: qty 10

## 2015-05-07 MED ORDER — IPRATROPIUM-ALBUTEROL 0.5-2.5 (3) MG/3ML IN SOLN: 3.0000 mL | Freq: Four times a day (QID) | RESPIRATORY_TRACT | Status: DC | PRN

## 2015-05-07 MED ORDER — ACETAMINOPHEN 325 MG PO TABS: 650.0000 mg | ORAL_TABLET | Freq: Four times a day (QID) | ORAL | Status: DC | PRN

## 2015-05-07 MED ORDER — POTASSIUM CHLORIDE CRYS ER 20 MEQ PO TBCR
40.0000 meq | EXTENDED_RELEASE_TABLET | Freq: Once | ORAL | Status: AC
  Administered 2015-05-07: 40 meq via ORAL
  Filled 2015-05-07: qty 2

## 2015-05-07 MED ORDER — NITROGLYCERIN 0.4 MG SL SUBL
0.4000 mg | SUBLINGUAL_TABLET | SUBLINGUAL | Status: DC | PRN
  Administered 2015-05-07: 0.4 mg via SUBLINGUAL
  Filled 2015-05-07 (×2): qty 1

## 2015-05-07 MED ORDER — DEXTROSE 5 % IV SOLN
2.0000 g | INTRAVENOUS | Status: DC
  Administered 2015-05-07 – 2015-05-08 (×2): 2 g via INTRAVENOUS
  Filled 2015-05-07 (×2): qty 2

## 2015-05-07 MED ORDER — IOHEXOL 300 MG/ML  SOLN
100.0000 mL | Freq: Once | INTRAMUSCULAR | Status: AC | PRN
  Administered 2015-05-07: 100 mL via INTRAVENOUS

## 2015-05-07 MED ORDER — ONDANSETRON HCL 4 MG/2ML IJ SOLN: 4.0000 mg | Freq: Four times a day (QID) | INTRAMUSCULAR | Status: DC | PRN

## 2015-05-07 MED ORDER — SODIUM CHLORIDE 0.9% FLUSH
3.0000 mL | Freq: Two times a day (BID) | INTRAVENOUS | Status: DC
  Administered 2015-05-07 – 2015-05-08 (×3): 3 mL via INTRAVENOUS
  Filled 2015-05-07: qty 3

## 2015-05-07 MED ORDER — DEXTROSE 5 % IV SOLN
500.0000 mg | Freq: Once | INTRAVENOUS | Status: AC
  Administered 2015-05-07: 500 mg via INTRAVENOUS
  Filled 2015-05-07: qty 500

## 2015-05-07 MED ORDER — DEXTROSE 5 % IV SOLN
500.0000 mg | INTRAVENOUS | Status: DC
  Administered 2015-05-07: 500 mg via INTRAVENOUS
  Filled 2015-05-07: qty 500

## 2015-05-07 MED ORDER — CARVEDILOL 25 MG PO TABS
25.0000 mg | ORAL_TABLET | Freq: Two times a day (BID) | ORAL | Status: DC
  Administered 2015-05-07 – 2015-05-08 (×3): 25 mg via ORAL
  Filled 2015-05-07 (×3): qty 1

## 2015-05-07 MED ORDER — PROMETHAZINE HCL 25 MG/ML IJ SOLN: 12.5000 mg | INTRAMUSCULAR | Status: DC | PRN

## 2015-05-07 MED ORDER — DEXTROSE 5 % IV SOLN: 1.0000 g | Freq: Once | INTRAVENOUS | Status: DC

## 2015-05-07 MED ORDER — PANTOPRAZOLE SODIUM 40 MG PO TBEC
40.0000 mg | DELAYED_RELEASE_TABLET | Freq: Every day | ORAL | Status: DC
  Administered 2015-05-08: 40 mg via ORAL
  Filled 2015-05-07: qty 1

## 2015-05-07 MED ORDER — ONDANSETRON HCL 4 MG PO TABS: 4.0000 mg | ORAL_TABLET | Freq: Four times a day (QID) | ORAL | Status: DC | PRN

## 2015-05-07 NOTE — H&P (Signed)
[facility] History and Physical  Roger Martinez [id number] DOB: 09/15/1965 DOA: 05/06/2015  Referring physician: Jeannett Senior, PA-C PCP: Candee Furbish, MD   Chief Complaint: Dehydration, nausea and emesis.  HPI: Roger Martinez is a 50 y.o. male with a past medical history of CAD, S/P MI, S/P triple-vessel CABG, hypertension, hyperlipidemia, chronic systolic CHF who was referred from an Urgent [facility] after the patient presented there with complaints of weakness, weight loss of 30 pounds, loss of appetite, abdominal pain, nausea with multiple episodes of emesis daily.  Per patient, about 11 days ago he started having cold-like symptoms of nasal congestion, sinus headache, cough, weakness and body aches. He was seen in the emergency department 9 days ago, his workup was benign and he was discharged home. He states that he has continued to have cough, but in the last 6-7 days he started developing nausea with emesis multiple episodes daily. Per patient, he has lost 30 pounds since then.  When seen, the patient was in no acute distress. He stated that his symptoms were better.  Review of Systems:  Constitutional:  Positive for weight loss,chills, fatigue.  Denies night sweats, Fevers. HEENT:  Positive nasal congestion, cough and headache last week. Cardio-vascular:  No chest pain, Orthopnea, PND, swelling in lower extremities, anasarca, dizziness, palpitations  GI:  abdominal pain, nausea, vomiting, loss of appetite  No heartburn, indigestion,  diarrhea, change in bowel habits. Resp:  Positive mild dyspnea, productive cough of yellowish sputum, no wheezing, no hemoptysis. Skin:  No rash or lesions.  GU:  No dysuria, change in color of urine, no urgency or frequency. No flank pain.  Musculoskeletal:  Positive body aches and back pain (worse last week).  Psych:  Difficulty sleeping due to symptoms. No change in mood or affect. No depression or anxiety. No memory  loss.   Past Medical History  Diagnosis Date  . Coronary artery disease   . Hypertension   . Congestive heart failure ([place])   . Other and unspecified hyperlipidemia   . Anxiety state, unspecified   . MI (myocardial infarction) ([place]) 03/2011  . STD (sexually transmitted disease)    Past Surgical History  Procedure Laterality Date  . Bypass graft  2011    3 vessel  . Ep implantable device  06/17/2014    DR [name]  . Implantable cardioverter defibrillator implant N/A 06/17/2014    Procedure: IMPLANTABLE CARDIOVERTER DEFIBRILLATOR IMPLANT;  Surgeon: Deboraha Sprang, MD;  Location: [facility] CATH LAB;  Service: Cardiovascular;  Laterality: N/A;   Social History:  reports that he has been smoking Cigarettes.  He has been smoking about 0.00 packs per day for the past 0 years. He has never used smokeless tobacco. He reports that he does not drink alcohol or use illicit drugs.  No Known Allergies  Family History  Problem Relation Age of Onset  . Hypertension Mother   . Diabetes Mother     Prior to Admission medications   Medication Sig Start Date End Date Taking? Authorizing Provider  aspirin EC 81 MG tablet Take 81 mg by mouth every 3 (three) days.     Historical Provider, MD  carvedilol (COREG) 25 MG tablet Take 1 tablet (25 mg total) by mouth 2 (two) times daily with a meal. Patient taking differently: Take 25 mg by mouth every 3 (three) days.  06/14/14   Jerline Pain, MD  furosemide (LASIX) 40 MG tablet Take 1 tablet (40 mg total) by mouth 2 (two) times daily. Patient  taking differently: Take 40 mg by mouth every 3 (three) days.  06/14/14   Jerline Pain, MD  hydrALAZINE (APRESOLINE) 50 MG tablet Take 50 mg by mouth every 3 (three) days.     Historical Provider, MD  HYDROcodone-homatropine (HYCODAN) 5-1.5 MG/5ML syrup Take 5 mLs by mouth at bedtime as needed for cough. 04/27/15   Jaime Pilcher Ward, PA-C  NITROSTAT 0.4 MG SL tablet PLACE ONE TABLET UNDER THE TONGUE EVERY 5 MINUTES AS NEEDED FOR  CHEST PAIN. 04/04/15   Jerline Pain, MD  potassium chloride SA (K-DUR,KLOR-CON) 20 MEQ tablet Take 20 mEq by mouth every 3 (three) days.     Historical Provider, MD  sacubitril-valsartan (ENTRESTO) 24-26 MG Take 1 tablet by mouth 2 (two) times daily. Take 1 tablet by mouth every Monday, Wednesday, and Friday. Patient taking differently: Take 1 tablet by mouth every 3 (three) days.  03/18/15   Jerline Pain, MD  sildenafil (VIAGRA) 100 MG tablet Take 1 tablet (100 mg total) by mouth daily as needed for erectile dysfunction. 03/18/15   Jerline Pain, MD  simvastatin (ZOCOR) 20 MG tablet Take 20 mg by mouth every 3 (three) days.     Historical Provider, MD   Physical Exam: Filed Vitals:   05/06/15 2142 05/06/15 2339 05/07/15 0000 05/07/15 0030  BP: 156/104 139/99 165/97 177/106  Pulse: 69 95  79  Temp: 98.4 F (36.9 C)     TempSrc: Oral     Resp: 16 24 23 20   SpO2: 95% 98%  96%    Wt Readings from Last 3 Encounters:  05/06/15 78.472 kg (173 lb)  04/27/15 88.905 kg (196 lb)  03/18/15 91.536 kg (201 lb 12.8 oz)    General:  Appears calm and comfortable Eyes: PERRL, normal lids, irises & conjunctiva ENT: grossly normal hearing, lips and oral mucosa are dry. Neck: no LAD, masses or thyromegaly Cardiovascular: RRR, no m/r/g. No LE edema. Telemetry: SR, no arrhythmias  Respiratory: Decreased breath sounds at the bases, subtle rhonchi on upper airways, no                      wheezing, no crackles. Abdomen: BS positive, soft, no distention, mild epigastric tenderness, no guarding/rebound. Skin: Skin looks dry Musculoskeletal: grossly normal tone BUE/BLE Psychiatric: grossly normal mood and affect, speech fluent and appropriate Neurologic: Awake, alert, oriented 3, grossly non-focal.          Labs on Admission:  Basic Metabolic Panel:  Recent Labs Lab 05/06/15 2254  NA 144  K 3.2*  CL 108  CO2 23  GLUCOSE 88  BUN 31*  CREATININE 1.32*  CALCIUM 8.8*   Liver Function  Tests:  Recent Labs Lab 05/06/15 2254  AST 22  ALT 11*  ALKPHOS 66  BILITOT 1.0  PROT 7.2  ALBUMIN 3.1*    Recent Labs Lab 05/06/15 2254  LIPASE 17   CBC:  Recent Labs Lab 05/06/15 1923 05/06/15 2254  WBC 10.5* 9.6  NEUTROABS  --  5.5  HGB 17.9 15.4  HCT 52.2 44.8  MCV 88.6 90.3  PLT  --  357    Radiological Exams on Admission: Dg Chest 2 View  05/06/2015  CLINICAL DATA:  Vomiting and weight loss of unknown duration. Initial encounter. EXAM: CHEST  2 VIEW COMPARISON:  PA and lateral chest 04/27/2015 and 02/05/2015. FINDINGS: The patient is status post CABG with an AICD in place. The lungs are clear. Heart size is enlarged. No pneumothorax  or pleural effusion. No focal bony abnormality. IMPRESSION: Cardiomegaly without acute disease. Electronically Signed   By: Inge Rise M.D.   On: 05/06/2015 23:34   Ct Abdomen Pelvis W Contrast  05/07/2015  CLINICAL DATA:  Weakness beginning 1.5 weeks ago. Heartburn, nausea, inability to eat or drink. Patient is lost over 20 pounds. EXAM: CT ABDOMEN AND PELVIS WITH CONTRAST TECHNIQUE: Multidetector CT imaging of the abdomen and pelvis was performed using the standard protocol following bolus administration of intravenous contrast. CONTRAST:  24mL OMNIPAQUE IOHEXOL 300 MG/ML SOLN, 155mL OMNIPAQUE IOHEXOL 300 MG/ML SOLN COMPARISON:  CT chest 01/09/2012 FINDINGS: Focal areas of patchy infiltration noted in both lung bases, suggesting multifocal pneumonia. Small lipoma demonstrated in the right chest wall muscles. Postoperative changes in the mediastinum. The liver, spleen, gallbladder, pancreas, adrenal glands, inferior vena cava, and retroperitoneal lymph nodes are unremarkable. Left renal cyst. No hydronephrosis in either kidney. Atherosclerotic calcification in the abdominal aorta and branch vessels without aneurysm. Prominent calcification in the celiac axis and superior mesenteric artery origins. Vessels appear patent but stenosis is not  excluded. The stomach and small bowel are decompressed. Colon is mostly decompressed with scattered stool present. No free air or free fluid in the abdomen. Small umbilical hernia containing fat. Pelvis: Prostate gland is enlarged, measuring 5.2 cm transverse dimension. Bladder wall is not thickened. Scattered diverticula in the sigmoid colon without evidence of diverticulitis. No pelvic mass or lymphadenopathy. No free or loculated pelvic fluid collections. The appendix is normal. Degenerative changes in the lumbar spine. No destructive bone lesions. IMPRESSION: Patchy infiltrates in the lung bases suggesting pneumonia. No evidence of bowel obstruction or inflammation. No acute process demonstrated in the abdomen or pelvis. Prostate gland is enlarged. Atherosclerotic calcification of the abdominal aorta and branch vessels including the superior mesenteric artery and celiac axis. Electronically Signed   By: Lucienne Capers M.D.   On: 05/07/2015 00:33   Dg Abd 2 Views  05/06/2015  CLINICAL DATA:  Nausea and vomiting today.  Initial encounter. EXAM: ABDOMEN - 2 VIEW COMPARISON:  None. FINDINGS: The bowel gas pattern is normal. There is no evidence of free air. No radio-opaque calculi or other significant radiographic abnormality is seen. Scoliosis statement lead from an AICD are noted. IMPRESSION: Negative exam. Electronically Signed   By: Inge Rise M.D.   On: 05/06/2015 20:29    EKG: Independently reviewed.  Vent. rate 73 BPM PR interval 170 ms QRS duration 121 ms QT/QTc 524/577 ms P-R-T axes 90 132 259 Ventricular premature complex Left atrial enlargement Consider left ventricular hypertrophy Marked T wave abnormality, consider anterolateral ischemia Marked T wave abnormality, consider inferior ischemia Prolonged QT interval No significant change since last EKG  Assessment/Plan Principal Problem:   Intractable nausea and vomiting   Dehydration Admit to telemetry. Zofran or Phenergan  as needed. Pantoprazole 40 mg IVP 1 dose. The patient received 1 L of fluids bolus prior to arrival. Continue IV hydration with normal saline plus KCl 40 mEq at 100 ML/hr for 20 hours. Monitor intake and output  Active Problems:   CAP (community acquired pneumonia) Continue supplemental oxygen. Continue IV antibiotics. Check sputum Gram stain, culture and sensitivity. Bronchodilators as needed. His respiratory symptoms are very mild, if nausea and emesis resolve, He can be bridged to oral antibiotics and discharged home in less than 48 hours.  However, the patient has a significant history of medication noncompliance.    Hypokalemia Continue replacement through the IV fluids. Check follow-up potassium level in the  morning. Check magnesium level    HTN (hypertension)   CAD, multiple vessel s/p CABG 2011    Chronic systolic heart failure ([facility])   Dyslipidemia Not taking the antihypertensives as prescribed. Per patient, he is taking them every 3 days. We will give clonidine 0.1 mg 1 dose. Resume oral medications in a.m. if the patient is tolerating oral intake.       H/O medication noncompliance Patient is taking his medications every 3 days.      Tobacco abuse disorder. Patient declined nicotine replacement therapy. He stated that he quit smoking 2 days ago.    Code Status: Full code. DVT Prophylaxis: SCDs. Family Communication:  Disposition Plan: Admit for symptoms treatment, rehydration and IV antibiotics.  Time spent: About 70 minutes were spent during the process of this admission.  Reubin Milan [facility] Pager [phone].

## 2015-05-07 NOTE — Consult Note (Signed)
Cardiology Consult    Patient ID: Per Notaro MRN: [id number], DOB/AGE: January 24, 1966   Admit date: 05/06/2015 Date of Consult: 05/07/2015  Primary Physician: Candee Furbish, MD Primary Cardiologist: Jerilynn Mages. Gillian Shields, MD  Requesting Provider: I. Doyle Askew, MD  Patient Profile    50 y/o ?with a h/o CAD s/p CABG in 2011, ICM/CHF s/p ICD in 06/2014 who was admitted w/ CAP, N/V, and was found to have elevated troponins.  Past Medical History   Past Medical History  Diagnosis Date  . Coronary artery disease     a. 2011 s/p CABG x 3.  . Hypertensive heart disease with heart failure ([place])   . Chronic combined systolic (congestive) and diastolic (congestive) heart failure ([place])     a. 03/2014 Echo: EF 20-25%, diff HK, Gr 2 DD, mild AI, sev dil LA, mod reduced RV fxn, PASP 1mmHg.  . Ischemic cardiomyopathy     a. 03/2014 Echo: EF 20-25%;  b. 06/2014 s/p BSX single lead AICD, ser # [id number].  Marland Kitchen Anxiety state, unspecified   . MI (myocardial infarction) ([place]) 03/2011  . STD (sexually transmitted disease)   . Hyperlipidemia   . Tobacco abuse   . Noncompliance   . Marijuana abuse     Past Surgical History  Procedure Laterality Date  . Bypass graft  2011    3 vessel  . Ep implantable device  06/17/2014    DR [name]  . Implantable cardioverter defibrillator implant N/A 06/17/2014    Procedure: IMPLANTABLE CARDIOVERTER DEFIBRILLATOR IMPLANT;  Surgeon: Deboraha Sprang, MD;  Location: [facility] CATH LAB;  Service: Cardiovascular;  Laterality: N/A;    Allergies  No Known Allergies  History of Present Illness    50 y/o male with a h/o CAD s/p CABG x 3 in 2011.  He also has a h/o noncompliance, ICM (EF 20-25%) s/p single lead ICD in 06/2014, and chronic systolic CHF.  From a cardiac standpoint, he says that he was doing relatively well until about a month ago, at which time he began to note more DOE than he was used to.  He was not having c/p and also denies pnd, orthopnea, edema, wt gain, or early satiety.  Due to  his finances, he does not take his meds as Rx and takes several of them just a few x/wk.  He was in his usoh until about 2 wks ago, when he had cold Ss (cough/nasal congestion) and presented to the [facility] ED on 1/15.  He was seen by our team and it was not felt that there were any active cardiac issues.  He was Rx hycodan for his cough and d/c'd from the ED.  About 3 days later, he went to the [employer] and prior to eating anything, he became nauseated.  He ended up not eating anything and after returning home, experienced diarrhea and vomiting.  The diarrhea cleared within two days after using imodium, however the nausea and intermittent vomiting have persisted.  Since 1/15, in the setting of n, v, and anorexia, his wt dropped from 196 on 1/15 to 173 lbs on admission 1/24.  Due to ongoing n, v, anorexia, he presented to UC on 1/24 and was referred to the [facility] ED.  Here, CXR and abd films were unremarkable.  Abd CT did not show any abd/pelvic acute processes, but did incidentally show infiltrates in bilat lung bases suggestive of PNA.  He was admitted by IM and placed on azithromycin and ceftriaxone.  Troponins were sent off and returned  mildly elevated @ 0.08 x 2.  He denies c/p or dyspnea at this time.  Inpatient Medications    . azithromycin  500 mg Intravenous Q24H  . cefTRIAXone (ROCEPHIN)  IV  2 g Intravenous Q24H  . [START ON 05/08/2015] pantoprazole  40 mg Oral Daily  . sodium chloride flush  3 mL Intravenous Q12H    Family History    Family History  Problem Relation Age of Onset  . Hypertension Mother     deceased.  . Diabetes Mother   . Other Father     alive - pt unaware of medical hx.    Social History    Social History   Social History  . Marital Status: Legally Separated    Spouse Name: N/A  . Number of Children: N/A  . Years of Education: N/A   Occupational History  . Not on file.   Social History Main Topics  . Smoking status: Current Every Day Smoker -- 0.50  packs/day for 20 years    Types: Cigarettes  . Smokeless tobacco: Never Used  . Alcohol Use: No  . Drug Use: Yes    Special: Marijuana     Comment: smokes marijuana at bedtime several nights/week.  Marland Kitchen Sexual Activity: Not on file   Other Topics Concern  . Not on file   Social History Narrative   Lives in [place] with wife.  Does not routinely exercise.  Unemployed.    Review of Systems    General:  No chills, fever, night sweats or weight changes.  Cardiovascular:  No chest pain, occas dyspnea on exertion over past month.  No edema, orthopnea, palpitations, paroxysmal nocturnal dyspnea. Dermatological: No rash, lesions/masses Respiratory: He did have cough and congestion about 10 days ago.  Resolved.  Occas dyspnea.   Urologic: No hematuria, dysuria Abdominal:   +++ nausea, vomiting, diarrhea over past 10 days. bright red blood per rectum, melena, or hematemesis Neurologic:  No visual changes, wkns, changes in mental status. All other systems reviewed and are otherwise negative except as noted above.  Physical Exam    Blood pressure 158/94, pulse 78, temperature 98.2 F (36.8 C), temperature source Oral, resp. rate 16, height 5\' 11"  (1.803 m), weight 180 lb 6.4 oz (81.829 kg), SpO2 100 %.  General: Pleasant, NAD Psych: Normal affect. Neuro: Alert and oriented X 3. Moves all extremities spontaneously. HEENT: Normal  Neck: Supple without bruits or JVD. Lungs:  Resp regular and unlabored, CTA. Heart: RRR no s3, s4, or murmurs. Abdomen: Soft, non-tender, non-distended, BS + x 4.  Extremities: No clubbing, cyanosis or edema. DP/PT/Radials 2+ and equal bilaterally.  Labs    Troponin [facility] of Care Test)  Recent Labs  05/06/15 2258  TROPIPOC 0.06    Recent Labs  05/07/15 0237 05/07/15 0900 05/07/15 1355  TROPONINI 0.08* 0.08* 0.08*   Lab Results  Component Value Date   WBC 8.0 05/07/2015   HGB 14.4 05/07/2015   HCT 43.8 05/07/2015   MCV 92.6 05/07/2015    PLT 361 05/07/2015     Recent Labs Lab 05/07/15 0540  NA 142  K 3.3*  CL 110  CO2 24  BUN 27*  CREATININE 1.07  CALCIUM 9.0  PROT 7.2  BILITOT 1.0  ALKPHOS 67  ALT 11*  AST 22  GLUCOSE 89   Lab Results  Component Value Date   CHOL 171 04/06/2013   HDL 46 04/06/2013   LDLCALC 118* 04/06/2013   TRIG 34 04/06/2013  Radiology Studies    Dg Chest 2 View  05/06/2015  CLINICAL DATA:  Vomiting and weight loss of unknown duration. Initial encounter. EXAM: CHEST  2 VIEW COMPARISON:  PA and lateral chest 04/27/2015 and 02/05/2015. FINDINGS: The patient is status post CABG with an AICD in place. The lungs are clear. Heart size is enlarged. No pneumothorax or pleural effusion. No focal bony abnormality. IMPRESSION: Cardiomegaly without acute disease. Electronically Signed   By: Inge Rise M.D.   On: 05/06/2015 23:34   Ct Abdomen Pelvis W Contrast  05/07/2015  CLINICAL DATA:  Weakness beginning 1.5 weeks ago. Heartburn, nausea, inability to eat or drink. Patient is lost over 20 pounds. EXAM: CT ABDOMEN AND PELVIS WITH CONTRAST TECHNIQUE: Multidetector CT imaging of the abdomen and pelvis was performed using the standard protocol following bolus administration of intravenous contrast. CONTRAST:  58mL OMNIPAQUE IOHEXOL 300 MG/ML SOLN, 167mL OMNIPAQUE IOHEXOL 300 MG/ML SOLN COMPARISON:  CT chest 01/09/2012 FINDINGS: Focal areas of patchy infiltration noted in both lung bases, suggesting multifocal pneumonia. Small lipoma demonstrated in the right chest wall muscles. Postoperative changes in the mediastinum. The liver, spleen, gallbladder, pancreas, adrenal glands, inferior vena cava, and retroperitoneal lymph nodes are unremarkable. Left renal cyst. No hydronephrosis in either kidney. Atherosclerotic calcification in the abdominal aorta and branch vessels without aneurysm. Prominent calcification in the celiac axis and superior mesenteric artery origins. Vessels appear patent but stenosis  is not excluded. The stomach and small bowel are decompressed. Colon is mostly decompressed with scattered stool present. No free air or free fluid in the abdomen. Small umbilical hernia containing fat. Pelvis: Prostate gland is enlarged, measuring 5.2 cm transverse dimension. Bladder wall is not thickened. Scattered diverticula in the sigmoid colon without evidence of diverticulitis. No pelvic mass or lymphadenopathy. No free or loculated pelvic fluid collections. The appendix is normal. Degenerative changes in the lumbar spine. No destructive bone lesions. IMPRESSION: Patchy infiltrates in the lung bases suggesting pneumonia. No evidence of bowel obstruction or inflammation. No acute process demonstrated in the abdomen or pelvis. Prostate gland is enlarged. Atherosclerotic calcification of the abdominal aorta and branch vessels including the superior mesenteric artery and celiac axis. Electronically Signed   By: Lucienne Capers M.D.   On: 05/07/2015 00:33   Dg Abd 2 Views  05/06/2015  CLINICAL DATA:  Nausea and vomiting today.  Initial encounter. EXAM: ABDOMEN - 2 VIEW COMPARISON:  None. FINDINGS: The bowel gas pattern is normal. There is no evidence of free air. No radio-opaque calculi or other significant radiographic abnormality is seen. Scoliosis statement lead from an AICD are noted. IMPRESSION: Negative exam. Electronically Signed   By: Inge Rise M.D.   On: 05/06/2015 20:29    ECG & Cardiac Imaging    RSR, 72, LAE, inf, antlat ST dep/TWI, LVH.  Assessment & Plan    1.  Nausea/Vomiting/Anorexia:  Pt presented to ED on 1/24 with a 10 day h/o nausea and vomiting associated with anorexia and wt loss. Abd/Pelvic CT w/o acute pathology.  Still nauseated.  No vomiting this AM.  PPI/antiemetics per IM.  Cont IVF - no evidence of volume overload.  I don't think that this represents an anginal equivalent.  2.  Chronic combined systolic/diastolic CHF/ICM:  He has been off of all meds since 1/14 in  setting of #1. Euvolemixc on exam though he says he was having some doe over the past month - I suspect this is likely 2/2 his noncompliance.  Hypertensive on admission.  Resume  coreg with plan to add back other agents as tolerated.  He says that he has trouble affording his meds, especially entresto, and as a result, he is only taking them a few days/wk.  He would likely be better served if we d/c entresto and add back an ACEI, as being able to afford it may increase his compliance with all of his meds.  Cont to hold lasix in setting of #1.    3.  Hypertensive Heart Disease:  As above, BP up off of all meds over past 10 days.  Resume coreg.  Plan to add acei if bp stable and he's tolerating PO's.  4.  HL:  Resume statin if tolerating PO's.  5.  CAD/Elevated troponin:  No chest pain.  Troponin is mildly elevated @ 0.08 x 2.  Flat trend suggests probable demand ischemia in setting of #1.  Resume ASA, bb, statin as above, if able to tolerate PO's.  F/U echo to assess for any further worsening of LV fxn.  If EF stable, would not pursue inpt eval.  Could consider outpt MV.  6.  Noncompliance:  Likely driving DOE @ home.  Says it's b/c of finances but even skipping $4 meds multiple days/wk.  Entresto cost not helping.  Will be well-served by simplifying regimen - bb, acei.  Try to get him off of hydralazine sine it's a TID med and he's only taking every three days.  7.  Tobacco/Marijuana Abuse:  Complete cessation advised.  8.  Hypokalemia:  supp.  Signed, Murray Hodgkins, NP 05/07/2015, 2:43 PM  I have seen, examined and evaluated the patient this PM along with Mr. Sharolyn Douglas, NP-C.  After reviewing all the available data and chart,  I agree with his findings, examination as well as impression recommendations.  Interesting situation with gentleman with long-standing combined CHF/ischemic cardiomyopathy at a relatively young age. He reportedly is on medications that he probably does not take on a standing  basis. He is been noticing some exertional dyspnea and just a feeling of "uneasiness chest for a couple months now, but he came in this time with intractable nausea and vomiting with anorexia and weight loss. I think he probably is volume depleted/dehydrated and therefore is not showing significance of volume overload.  He is a very mild elevation in troponin which could be very well related to dehydration and mild renal insufficiency in the setting of known cardiomyopathy.  At this point I agree with the plan to gradually reinitiate his home medications once he is able take by mouth. Recheck an echocardiogram to make sure things stable. 3 had an ostial medications and he is feeling better from a GI standpoint, we can reassess his dyspnea. Is still present, we would consider potentially outpatient Myoview stress test.  He needs to stop smoking cigarettes and marijuana as this was only 70 exacerbate his condition.  I would hold off of his diuretic while he is still not taking good by mouth, but once he is able to start taking medications would gradually add back his beta blocker first and then we can consider whether he is Entresto or ACE inhibitor/ARB. If obtaining Delene Loll is to difficult from a financial standpoint, I would go back to either ACE inhibitor or ARB. I rather him take a medicine that is not as good and not taking medicine that is better.  We'll continue to follow.     Leonie Man, M.D., M.S. Interventional Cardiologist   Pager # [phone] Phone # [phone]  [address] [place], [place] [address]

## 2015-05-07 NOTE — ED Notes (Signed)
Patient transported to CT 

## 2015-05-07 NOTE — ED Notes (Signed)
Pt called out approx 15 minutes ago and stated that he had been having "intense chest pain that feels like a heart attack for 45 minutes, it was a 30 about 20 minutes ago, now its a 6/10". Pt was very upset when I entered the room. The pt reported left sided cp, stating he needed pain meds. EKG was obtained, given to dr. Alcario Drought, new orders obtained at that time. Also notified Dr. Baltazar Najjar who Dr. Alcario Drought wanted me to notify. Pt then refused nitro because it gives him heartburn and a severe headache. States his pain is a 0/10

## 2015-05-07 NOTE — Progress Notes (Signed)
Pt admitted after midnight, please see Dr. Olevia Bowens admission note, admitted for chest pain evaluation. Pt with known ICM and EF 25%, troponins mildly elevated, flat. Cardio consulted. Gave one dose of K-dur 40 MEQ this AM. CBC and BMP in AM.  Faye Ramsay, MD  [facility] Pager [phone]  If 7PM-7AM, please contact night-coverage [email or web address] Password [email or web address]

## 2015-05-07 NOTE — Progress Notes (Signed)
Initial Nutrition Assessment  DOCUMENTATION CODES:   Severe malnutrition in context of acute illness/injury  INTERVENTION:  - Continue CLD  - Diet advancement as medically feasible with improvement/resolution of N/V - RD will continue to monitor for needs  NUTRITION DIAGNOSIS:   Inadequate oral intake related to acute illness, nausea, vomiting as evidenced by per patient/family report.  GOAL:   Patient will meet greater than or equal to 90% of their needs  MONITOR:   PO intake, Diet advancement, Weight trends, Labs, I & O's  REASON FOR ASSESSMENT:   Malnutrition Screening Tool  ASSESSMENT:   50 y.o. male with a past medical history of CAD, S/P MI, S/P triple-vessel CABG, hypertension, hyperlipidemia, chronic systolic CHF who was referred from an Urgent [place] after the patient presented there with complaints of weakness, weight loss of 30 pounds, loss of appetite, abdominal pain, nausea with multiple episodes of emesis daily.  Pt seen for MST. BMI indicates overweight status. Per chart review, pt consumed 100% of CLD breakfast tray. He was sleeping at time of RD visit and all information was provided by his wife, who is at bedside. Pt has had ongoing N/V since 04/27/15. The last solid food he consumed was chicken and baked beans for dinner 1/14. Since 1/15 he has had nausea and vomiting with all PO intakes, including water and other liquids. Wife reports that pt has hx of reflux and initially pt and wife felt that he was experiencing reflux, but realized it likely was not reflux because N/V was ongoing and in large amounts. Wife is unsure if pt was having emesis without PO intakes PTA. Lunch tray in room untouched.  Per her report, pt has been losing a significant amount of weight in the past 1.5 weeks but is unsure of UBW. Per chart review, pt has lost 16 lbs (8.4% body weight) in the past 1.5 weeks which is significant for time frame. Unable to perform physical assessment at  this time.   Will order supplements per pt desire/diet order once he is able to tolerate CLD without issue. Wife states pt had emesis following breakfast this AM. Not meeting needs. Medications reviewed; K: 3.3 m,mol/L, BUN: 2.7 mg/dL.   Diet Order:  Diet clear liquid Room service appropriate?: Yes; Fluid consistency:: Thin  Skin:  Reviewed, no issues  Last BM:  1/22  Height:   Ht Readings from Last 1 Encounters:  05/07/15 5\' 11"  (1.803 m)    Weight:   Wt Readings from Last 1 Encounters:  05/07/15 180 lb 6.4 oz (81.829 kg)    Ideal Body Weight:  78.18 kg (kg)  BMI:  Body mass index is 25.17 kg/(m^2).  Estimated Nutritional Needs:   Kcal:  2050-2250  Protein:  75-85 grams  Fluid:  >/= 2.2 L/day  EDUCATION NEEDS:   No education needs identified at this time     Jarome Matin, RD, LDN Inpatient Clinical Dietitian Pager # [phone] After hours/weekend pager # [phone]

## 2015-05-07 NOTE — Progress Notes (Signed)
Pt awakened to extreme "burning sensation" in his chest.  I asked for a nitrogycerin tab.  BP was elevated, no diaphoresis noted.  Nitro given with immediate relief noted.  12 Lead EKG did not reveal any changes.  MD notified, orders received.  Will continue to monitor.

## 2015-05-08 ENCOUNTER — Observation Stay (HOSPITAL_BASED_OUTPATIENT_CLINIC_OR_DEPARTMENT_OTHER): Payer: PRIVATE HEALTH INSURANCE

## 2015-05-08 ENCOUNTER — Observation Stay (HOSPITAL_COMMUNITY): Payer: PRIVATE HEALTH INSURANCE

## 2015-05-08 DIAGNOSIS — I2510 Atherosclerotic heart disease of native coronary artery without angina pectoris: Secondary | ICD-10-CM | POA: Diagnosis not present

## 2015-05-08 DIAGNOSIS — E860 Dehydration: Secondary | ICD-10-CM | POA: Diagnosis not present

## 2015-05-08 DIAGNOSIS — G43A1 Cyclical vomiting, intractable: Secondary | ICD-10-CM | POA: Diagnosis not present

## 2015-05-08 DIAGNOSIS — R079 Chest pain, unspecified: Secondary | ICD-10-CM | POA: Diagnosis not present

## 2015-05-08 DIAGNOSIS — E43 Unspecified severe protein-calorie malnutrition: Secondary | ICD-10-CM

## 2015-05-08 DIAGNOSIS — J189 Pneumonia, unspecified organism: Secondary | ICD-10-CM | POA: Diagnosis not present

## 2015-05-08 DIAGNOSIS — I5042 Chronic combined systolic (congestive) and diastolic (congestive) heart failure: Secondary | ICD-10-CM | POA: Diagnosis not present

## 2015-05-08 LAB — BASIC METABOLIC PANEL
Anion gap: 9 (ref 5–15)
BUN: 16 mg/dL (ref 6–20)
CALCIUM: 9 mg/dL (ref 8.9–10.3)
CO2: 26 mmol/L (ref 22–32)
CREATININE: 1.05 mg/dL (ref 0.61–1.24)
Chloride: 109 mmol/L (ref 101–111)
GFR calc Af Amer: >60 mL/min (ref >60)
Glucose, Bld: 80 mg/dL (ref 65–99)
Potassium: 3.6 mmol/L (ref 3.5–5.1)
SODIUM: 144 mmol/L (ref 135–145)

## 2015-05-08 LAB — CBC
HCT: 43.8 % (ref 39.0–52.0)
Hemoglobin: 14.3 g/dL (ref 13.0–17.0)
MCH: 30.6 pg (ref 26.0–34.0)
MCHC: 32.6 g/dL (ref 30.0–36.0)
MCV: 93.6 fL (ref 78.0–100.0)
PLATELETS: 354 10*3/uL (ref 150–400)
RBC: 4.68 MIL/uL (ref 4.22–5.81)
RDW: 13.1 % (ref 11.5–15.5)
WBC: 7.9 10*3/uL (ref 4.0–10.5)

## 2015-05-08 LAB — LEGIONELLA ANTIGEN, URINE

## 2015-05-08 MED ORDER — DOXYCYCLINE HYCLATE 100 MG PO CAPS: 100.0000 mg | ORAL_CAPSULE | Freq: Two times a day (BID) | ORAL | Status: DC

## 2015-05-08 MED ORDER — FUROSEMIDE 40 MG PO TABS: 40.0000 mg | ORAL_TABLET | Freq: Every day | ORAL | Status: DC

## 2015-05-08 MED ORDER — LISINOPRIL 10 MG PO TABS: 10.0000 mg | ORAL_TABLET | Freq: Every day | ORAL | Status: DC

## 2015-05-08 MED ORDER — LISINOPRIL 10 MG PO TABS
10.0000 mg | ORAL_TABLET | Freq: Every day | ORAL | Status: DC
  Administered 2015-05-08: 10 mg via ORAL
  Filled 2015-05-08: qty 1

## 2015-05-08 MED ORDER — PANTOPRAZOLE SODIUM 40 MG PO TBEC: 40.0000 mg | DELAYED_RELEASE_TABLET | Freq: Every day | ORAL | Status: DC

## 2015-05-08 NOTE — Discharge Summary (Signed)
Physician Discharge Summary  Roger Martinez [id number] DOB: March 09, 1966 DOA: 05/06/2015  PCP: Candee Furbish, MD  Admit date: 05/06/2015 Discharge date: 05/08/2015  Recommendations for Outpatient Follow-up:  1. Pt will need to follow up with PCP in 1-2 weeks post discharge 2. Please note pt left AMA in the evening hours   Discharge Diagnoses:  Principal Problem:   Intractable nausea and vomiting Active Problems:   H/O medication noncompliance   CAD, multiple vessel s/p CABG 2011    Dyslipidemia   Hypokalemia   Dehydration   CAP (community acquired pneumonia)   Tobacco abuse disorder   Chronic combined systolic (congestive) and diastolic (congestive) heart failure (HCC)   Hypertensive heart disease with heart failure (HCC)   Marijuana abuse   Protein-calorie malnutrition, severe  Discharge Condition: Stable  Diet recommendation: Heart healthy diet discussed in details   History of present illness:  Pt presented for evaluation of chest pain, nausea and non bloody vomiting, poor oral intake.   Hospital Course:  Principal Problem:   Intractable nausea and vomiting - unclear etiology, resolved per pt - CT abd with no acute findings - pt has refused further testing, opting to leave AMA - esophagogram not completed as pt refused   Active Problems:   Chronic combined systolic/diastolic CHF/ICM - has been off of all meds since 1/14 in setting of #1. Up 1.7L since admission in setting of hydration.  - ECHO done, results pending - medication list updated but pt not willing to participate in discussion  - opting to leave AMA     CAD/Elevated troponin - no further work up, pt leaving AMA    Bilateral Lower lobes PNA - d/c on doxycycline   Procedures/Studies: Dg Chest 2 View 05/06/2015  Cardiomegaly without acute disease  Ct Abdomen Pelvis W Contrast 05/07/2015   Patchy infiltrates in the lung bases suggesting pneumonia. No evidence of bowel obstruction or inflammation. No  acute process demonstrated in the abdomen or pelvis. Prostate gland is enlarged. Atherosclerotic calcification of the abdominal aorta and branch vessels including the superior mesenteric artery and celiac axis  Dg Esophagus 05/08/2015 Limited study, as above, considered nondiagnostic. The patient should not be charged a professional fee for the interpretation of this examination.   Dg Abd 2 Views 05/06/2015 Negative exam.   Discharge Exam: Filed Vitals:   05/08/15 1052 05/08/15 1539  BP: 148/89 131/57  Pulse: 60 58  Temp:  98.4 F (36.9 C)  Resp:  18   Filed Vitals:   05/08/15 0411 05/08/15 0704 05/08/15 1052 05/08/15 1539  BP: 172/104 160/95 148/89 131/57  Pulse: 75  60 58  Temp: 97.3 F (36.3 C)   98.4 F (36.9 C)  TempSrc: Oral   Oral  Resp: 20   18  Height:      Weight: 82.827 kg (182 lb 9.6 oz)     SpO2: 100%   98%    General: Pt is alert, follows commands appropriately, not in acute distress Cardiovascular: Regular rate and rhythm, S1/S2 +, no murmurs, no rubs, no gallops Respiratory: Clear to auscultation bilaterally, no wheezing, no crackles, no rhonchi Abdominal: Soft, non tender, non distended, bowel sounds +, no guarding  Discharge Instructions  Discharge Instructions    Diet - low sodium heart healthy    Complete by:  As directed      Increase activity slowly    Complete by:  As directed             Medication List  STOP taking these medications        hydrALAZINE 50 MG tablet  Commonly known as:  APRESOLINE     HYDROcodone-homatropine 5-1.5 MG/5ML syrup  Commonly known as:  HYCODAN     sacubitril-valsartan 24-26 MG  Commonly known as:  ENTRESTO     sildenafil 100 MG tablet  Commonly known as:  VIAGRA      TAKE these medications        aspirin EC 81 MG tablet  Take 81 mg by mouth every 3 (three) days.     carvedilol 25 MG tablet  Commonly known as:  COREG  Take 1 tablet (25 mg total) by mouth 2 (two) times daily with a meal.      furosemide 40 MG tablet  Commonly known as:  LASIX  Take 1 tablet (40 mg total) by mouth daily.     lisinopril 10 MG tablet  Commonly known as:  PRINIVIL,ZESTRIL  Take 1 tablet (10 mg total) by mouth daily.     NITROSTAT 0.4 MG SL tablet  Generic drug:  nitroGLYCERIN  PLACE ONE TABLET UNDER THE TONGUE EVERY 5 MINUTES AS NEEDED FOR CHEST PAIN.     pantoprazole 40 MG tablet  Commonly known as:  PROTONIX  Take 1 tablet (40 mg total) by mouth daily.     potassium chloride SA 20 MEQ tablet  Commonly known as:  K-DUR,KLOR-CON  Take 20 mEq by mouth every 3 (three) days.     simvastatin 20 MG tablet  Commonly known as:  ZOCOR  Take 20 mg by mouth every 3 (three) days.           Follow-up Information    Follow up with Candee Furbish, MD.   Specialty:  Cardiology   Contact information:   [id number] [place] [address] [phone]        The results of significant diagnostics from this hospitalization (including imaging, microbiology, ancillary and laboratory) are listed below for reference.     Microbiology: Recent Results (from the past 240 hour(s))  Culture, blood (routine x 2) Call MD if unable to obtain prior to antibiotics being given     Status: None (Preliminary result)   Collection Time: 05/07/15  5:40 AM  Result Value Ref Range Status   Specimen Description BLOOD RIGHT ARM  Final   Special Requests BOTTLES DRAWN AEROBIC AND ANAEROBIC 5CC  Final   Culture   Final    NO GROWTH 1 DAY Performed at [facility]    Report Status PENDING  Incomplete  Culture, blood (routine x 2) Call MD if unable to obtain prior to antibiotics being given     Status: None (Preliminary result)   Collection Time: 05/07/15  5:40 AM  Result Value Ref Range Status   Specimen Description BLOOD LEFT HAND  Final   Special Requests IN PEDIATRIC BOTTLE 4CC  Final   Culture   Final    NO GROWTH 1 DAY Performed at [facility]    Report Status PENDING   Incomplete     Labs: Basic Metabolic Panel:  Recent Labs Lab 05/06/15 1923 05/06/15 2254 05/07/15 0540 05/08/15 0405  NA 137 144 142 144  K 3.6 3.2* 3.3* 3.6  CL 99 108 110 109  CO2 25 23 24 26   GLUCOSE 105* 88 89 80  BUN 32* 31* 27* 16  CREATININE 1.44* 1.32* 1.07 1.05  CALCIUM 10.0 8.8* 9.0 9.0  MG  --   --  2.1  --   PHOS  --   --  2.6  --    Liver Function Tests:  Recent Labs Lab 05/06/15 1923 05/06/15 2254 05/07/15 0540  AST 22 22 22   ALT 8* 11* 11*  ALKPHOS 87 66 67  BILITOT 0.9 1.0 1.0  PROT 8.7* 7.2 7.2  ALBUMIN 4.1 3.1* 3.4*    Recent Labs Lab 05/06/15 2254  LIPASE 17   CBC:  Recent Labs Lab 05/06/15 1923 05/06/15 2254 05/07/15 0540 05/08/15 0405  WBC 10.5* 9.6 8.0 7.9  NEUTROABS  --  5.5 4.2  --   HGB 17.9 15.4 14.4 14.3  HCT 52.2 44.8 43.8 43.8  MCV 88.6 90.3 92.6 93.6  PLT  --  357 361 354   Cardiac Enzymes:  Recent Labs Lab 05/07/15 0237 05/07/15 0900 05/07/15 1355  TROPONINI 0.08* 0.08* 0.08*   SIGNED: Time coordinating discharge: 30 minutes  Faye Ramsay, MD  [facility] 05/08/2015, 6:35 PM Pager [phone]  If 7PM-7AM, please contact night-coverage [email or web address] Password [email or web address]

## 2015-05-08 NOTE — Discharge Instructions (Signed)

## 2015-05-08 NOTE — Progress Notes (Signed)
  Echocardiogram 2D Echocardiogram has been performed.  Darlina Sicilian M 05/08/2015, 9:53 AM

## 2015-05-08 NOTE — Progress Notes (Signed)
Patient Name: Roger Martinez Date of Encounter: 05/08/2015  Hospital Problem List     Principal Problem:   Intractable nausea and vomiting Active Problems:   CAD, multiple vessel s/p CABG 2011    CAP (community acquired pneumonia)   Dehydration   Chronic combined systolic (congestive) and diastolic (congestive) heart failure (HCC)   Hypertensive heart disease with heart failure ([place])   H/O medication noncompliance   Dyslipidemia   Hypokalemia   Tobacco abuse disorder   Marijuana abuse   Protein-calorie malnutrition, severe    Subjective   Nausea improved.  Tolerating clears this AM.  Hungry.  Breathing stable.  Inpatient Medications    . azithromycin  500 mg Intravenous Q24H  . carvedilol  25 mg Oral BID WC  . cefTRIAXone (ROCEPHIN)  IV  2 g Intravenous Q24H  . pantoprazole  40 mg Oral Daily  . sodium chloride flush  3 mL Intravenous Q12H    Vital Signs    Filed Vitals:   05/07/15 1724 05/07/15 2123 05/08/15 0411 05/08/15 0704  BP: 150/92 152/96 172/104 160/95  Pulse: 72 67 75   Temp:  98.1 F (36.7 C) 97.3 F (36.3 C)   TempSrc:  Oral Oral   Resp:  20 20   Height:      Weight:   182 lb 9.6 oz (82.827 kg)   SpO2:  100% 100%     Intake/Output Summary (Last 24 hours) at 05/08/15 0844 Last data filed at 05/08/15 0411  Gross per 24 hour  Intake   1880 ml  Output    100 ml  Net   1780 ml   Filed Weights   05/07/15 0341 05/08/15 0411  Weight: 180 lb 6.4 oz (81.829 kg) 182 lb 9.6 oz (82.827 kg)    Physical Exam    General: Pleasant, NAD. Neuro: Alert and oriented X 3. Moves all extremities spontaneously. Psych: Normal affect. HEENT:  Normal  Neck: Supple without bruits or JVD. Lungs:  Resp regular and unlabored, CTA. Heart: RRR no s3, s4, or murmurs. Abdomen: Soft, non-tender, non-distended, BS + x 4.  Extremities: No clubbing, cyanosis or edema. DP/PT/Radials 2+ and equal bilaterally.  Labs    CBC  Recent Labs  05/06/15 2254 05/07/15 0540  05/08/15 0405  WBC 9.6 8.0 7.9  NEUTROABS 5.5 4.2  --   HGB 15.4 14.4 14.3  HCT 44.8 43.8 43.8  MCV 90.3 92.6 93.6  PLT 357 361 [id number]   Basic Metabolic Panel  Recent Labs  05/07/15 0540 05/08/15 0405  NA 142 144  K 3.3* 3.6  CL 110 109  CO2 24 26  GLUCOSE 89 80  BUN 27* 16  CREATININE 1.07 1.05  CALCIUM 9.0 9.0  MG 2.1  --   PHOS 2.6  --    Liver Function Tests  Recent Labs  05/06/15 2254 05/07/15 0540  AST 22 22  ALT 11* 11*  ALKPHOS 66 67  BILITOT 1.0 1.0  PROT 7.2 7.2  ALBUMIN 3.1* 3.4*    Recent Labs  05/06/15 2254  LIPASE 17   Cardiac Enzymes  Recent Labs  05/07/15 0237 05/07/15 0900 05/07/15 1355  TROPONINI 0.08* 0.08* 0.08*   Hemoglobin A1C  Recent Labs  05/06/15 1955  HGBA1C 5.9   Telemetry    RSR, PVC's, 6 beats NSVT.  ECG    Rsr, 65, RAD, LAE, LVH, inf/antlat ST dep/TWI - unchanged.  Radiology    Dg Chest 2 View  05/06/2015  CLINICAL DATA:  Vomiting  and weight loss of unknown duration. Initial encounter. EXAM: CHEST  2 VIEW COMPARISON:  PA and lateral chest 04/27/2015 and 02/05/2015. FINDINGS: The patient is status post CABG with an AICD in place. The lungs are clear. Heart size is enlarged. No pneumothorax or pleural effusion. No focal bony abnormality. IMPRESSION: Cardiomegaly without acute disease. Electronically Signed   By: Inge Rise M.D.   On: 05/06/2015 23:34   Dg Chest 2 View  04/27/2015  CLINICAL DATA:  Productive cough EXAM: CHEST  2 VIEW COMPARISON:  02/05/2015 FINDINGS: Left subclavian AICD device is stable. Right hilar soft tissue fullness is stable. Lungs remain grossly clear. No pneumothorax. No pleural effusion. IMPRESSION: Stable chronic changes.  No active cardiopulmonary disease. Electronically Signed   By: Marybelle Killings M.D.   On: 04/27/2015 13:56   Ct Abdomen Pelvis W Contrast  05/07/2015  CLINICAL DATA:  Weakness beginning 1.5 weeks ago. Heartburn, nausea, inability to eat or drink. Patient is lost over  20 pounds. EXAM: CT ABDOMEN AND PELVIS WITH CONTRAST TECHNIQUE: Multidetector CT imaging of the abdomen and pelvis was performed using the standard protocol following bolus administration of intravenous contrast. CONTRAST:  11mL OMNIPAQUE IOHEXOL 300 MG/ML SOLN, 157mL OMNIPAQUE IOHEXOL 300 MG/ML SOLN COMPARISON:  CT chest 01/09/2012 FINDINGS: Focal areas of patchy infiltration noted in both lung bases, suggesting multifocal pneumonia. Small lipoma demonstrated in the right chest wall muscles. Postoperative changes in the mediastinum. The liver, spleen, gallbladder, pancreas, adrenal glands, inferior vena cava, and retroperitoneal lymph nodes are unremarkable. Left renal cyst. No hydronephrosis in either kidney. Atherosclerotic calcification in the abdominal aorta and branch vessels without aneurysm. Prominent calcification in the celiac axis and superior mesenteric artery origins. Vessels appear patent but stenosis is not excluded. The stomach and small bowel are decompressed. Colon is mostly decompressed with scattered stool present. No free air or free fluid in the abdomen. Small umbilical hernia containing fat. Pelvis: Prostate gland is enlarged, measuring 5.2 cm transverse dimension. Bladder wall is not thickened. Scattered diverticula in the sigmoid colon without evidence of diverticulitis. No pelvic mass or lymphadenopathy. No free or loculated pelvic fluid collections. The appendix is normal. Degenerative changes in the lumbar spine. No destructive bone lesions. IMPRESSION: Patchy infiltrates in the lung bases suggesting pneumonia. No evidence of bowel obstruction or inflammation. No acute process demonstrated in the abdomen or pelvis. Prostate gland is enlarged. Atherosclerotic calcification of the abdominal aorta and branch vessels including the superior mesenteric artery and celiac axis. Electronically Signed   By: Lucienne Capers M.D.   On: 05/07/2015 00:33   Dg Abd 2 Views  05/06/2015  CLINICAL DATA:   Nausea and vomiting today.  Initial encounter. EXAM: ABDOMEN - 2 VIEW COMPARISON:  None. FINDINGS: The bowel gas pattern is normal. There is no evidence of free air. No radio-opaque calculi or other significant radiographic abnormality is seen. Scoliosis statement lead from an AICD are noted. IMPRESSION: Negative exam. Electronically Signed   By: Inge Rise M.D.   On: 05/06/2015 20:29    Assessment & Plan    1. Nausea/Vomiting/Anorexia: Pt presented to ED on 1/24 with a 10 day h/o nausea and vomiting associated with anorexia and wt loss. Abd/Pelvic CT w/o acute pathology. Nausea improving.  Tolerating clears.  No vomiting but had diarrhea this AM. PPI/antiemetics per IM. ? Gastroenteritis/gastritis.  2. Chronic combined systolic/diastolic CHF/ICM: He has been off of all meds since 1/14 in setting of #1.  Up 1.7L since admission in setting of hydration.  Wt  up 2 lbs.  Prev dry wt ~ 196 lbs. Euvolemic on exam though he says he was having some doe over the past month - I suspect this is likely 2/2 his noncompliance. Hypertensive on admission and BP's remain mod elevated in setting of being off meds. Coreg resumed 1/24.  I suspect that we can potentially improve his compliance by simplifying his regimen and getting him off of entresto, which is expensive for him and he is only taking every couple of days.  Also, he says that he is taking lisinopril in conjunction with entresto, which is contraindicated.  He is calling family to get list.  I will add lisinopril today with a plan to titrate to max as tolerated.  If he continues to tolerate PO's, can probably add back lasix tomorrow (40 daily since he was taking 40 BID every three days previously).  Echo this AM.  3. Hypertensive Heart Disease: See discussion in #2.  4. HL: Resume statin.  5. CAD/Elevated troponin: Had burning chest pain yesterday evening - nitrate responsive.  ECG abnl but not acutely so. Troponin is mildly elevated @  0.08 x 3. Flat trend suggests probable demand ischemia in setting of #1. Cont ASA, bb, statin. F/U echo to assess for any further worsening of LV fxn. If EF stable, would not pursue inpt eval. Plan on outpt MV.  6. Noncompliance: Likely driving DOE @ home. Says it's b/c of finances but even skipping $4 meds multiple days/wk. Entresto cost not helping. Will be well-served by simplifying regimen - bb, acei. Try to get him off of hydralazine since it's a TID med and he's only taking every three days.  7. Tobacco/Marijuana Abuse: Complete cessation advised.  8. Hypokalemia: stable.  9.  CAP:  abx per IM.  Afebrile, nl WBC.  Signed, Murray Hodgkins NP   I saw examined patient today along with Mr. Sharolyn Douglas, NP-C. We discussed the chart details as well as telemetry and discuss his findings, examination of the above. I agree with his note.  Principal Problem:   Intractable nausea and vomiting Active Problems:   H/O medication noncompliance   CAD, multiple vessel s/p CABG 2011    Dyslipidemia   Hypokalemia   Dehydration   CAP (community acquired pneumonia)   Tobacco abuse disorder   Chronic combined systolic (congestive) and diastolic (congestive) heart failure (HCC)   Hypertensive heart disease with heart failure (HCC)   Marijuana abuse   Protein-calorie malnutrition, severe  2-D echocardiogram January 26: Study Conclusions  - Left ventricle: The cavity size was mildly dilated. There was severe concentric hypertrophy with more hypetrophy of the septum. Systolic function was severely reduced. The estimated ejection fraction was in the range of 25% to 30%. Diffuse hypokinesis. Features are consistent with a pseudonormal left ventricular filling pattern, with concomitant abnormal relaxation and increased filling pressure (grade 2 diastolic dysfunction). Doppler parameters are consistent with high ventricular filling pressure. - Aortic valve: There was  mild to moderate regurgitation. Regurgitation pressure half-time: 541 ms. - Mitral valve: There was mild regurgitation. - Left atrium: The atrium was severely dilated. - Right ventricle: The cavity size was normal. Wall thickness was normal. Systolic function was mildly reduced. - Right atrium: The atrium was mildly dilated. - Atrial septum: The septum bowed from left to right, consistent with increased left atrial pressure. No defect or patent foramen ovale was identified. - Tricuspid valve: There was moderate regurgitation. - Pulmonary arteries: PA peak pressure: 33 mm Hg (S). - Global longitudinal strain -  7%.    Really admitted for a noncardiac issue. His GI symptoms seem to have improved, but he still has heartburn type of pain. I'm seeing him now after he was down in radiology for some type of barium swallow. Is clearly evident that he is very upset about her interaction with the team down there. There are 2 different stories on hearing. He is very upset with the physician I personally spoke with a physician and explained to me the situation and that the patient was verbally belligerent with transportation staff upon arrival down to radiology, and provoke enough concern that the staff felt compelled to have security standing by. Then during the test he was noticing some pain which he had mentioned and felt as though he is being forced complete the study. I did my best to elicit the patient and try to diffuse the situation, but also discussed these concerns with the doctor in question in radiology.  Compliance is an issue with him given his severe cardiac condition. Since were not sure if he is taking Entresto, I really don't see the benefit of him being on an expensive medication without getting the benefit from it. For now I do agree with putting him back on ACE inhibitor. Once he proves to be compliant, then we can try to get him back on Entresto.  I do think that any 3 times a day  medication is probably not lead a good option for him. He is on twice a day carvedilol at baseline and now is on an ACE inhibitor. He is not actively having any heart symptoms and his echocardiogram that I just reviewed appeared to be relatively stable as compared to the most recent echo prior to this.   Based on that he was having some atypical chest discomfort episodes prior to arrival, I don't take his unreasonable to consider noninvasive stress testing once he is clinically more stable from his current GI issues. This can probably ordered as an outpatient in 3-4 weeks.   Leonie Man, M.D., M.S. Interventional Cardiologist   Pager # [phone] Phone # [phone] [address] [place], [place] [address]

## 2015-05-12 LAB — CULTURE, BLOOD (ROUTINE X 2)
CULTURE: NO GROWTH
Culture: NO GROWTH

## 2015-05-14 ENCOUNTER — Ambulatory Visit (INDEPENDENT_AMBULATORY_CARE_PROVIDER_SITE_OTHER): Payer: PRIVATE HEALTH INSURANCE | Admitting: *Deleted

## 2015-05-14 DIAGNOSIS — I255 Ischemic cardiomyopathy: Secondary | ICD-10-CM | POA: Diagnosis not present

## 2015-05-14 NOTE — Progress Notes (Signed)
Remote ICD transmission.   

## 2015-05-20 ENCOUNTER — Other Ambulatory Visit: Payer: Self-pay | Admitting: Cardiology

## 2015-05-21 ENCOUNTER — Telehealth: Payer: Self-pay | Admitting: Cardiology

## 2015-05-21 ENCOUNTER — Inpatient Hospital Stay (HOSPITAL_COMMUNITY)
Admission: EM | Admit: 2015-05-21 | Discharge: 2015-05-23 | DRG: 292 | Disposition: A | Discharge status: Home / Self Care | Payer: PRIVATE HEALTH INSURANCE | Attending: Internal Medicine | Admitting: Internal Medicine

## 2015-05-21 ENCOUNTER — Encounter (HOSPITAL_COMMUNITY): Payer: Self-pay

## 2015-05-21 ENCOUNTER — Emergency Department (HOSPITAL_COMMUNITY): Payer: PRIVATE HEALTH INSURANCE

## 2015-05-21 DIAGNOSIS — Z951 Presence of aortocoronary bypass graft: Secondary | ICD-10-CM | POA: Diagnosis not present

## 2015-05-21 DIAGNOSIS — E876 Hypokalemia: Secondary | ICD-10-CM | POA: Diagnosis not present

## 2015-05-21 DIAGNOSIS — Z95810 Presence of automatic (implantable) cardiac defibrillator: Secondary | ICD-10-CM | POA: Diagnosis not present

## 2015-05-21 DIAGNOSIS — N179 Acute kidney failure, unspecified: Secondary | ICD-10-CM | POA: Diagnosis present

## 2015-05-21 DIAGNOSIS — I083 Combined rheumatic disorders of mitral, aortic and tricuspid valves: Secondary | ICD-10-CM | POA: Diagnosis present

## 2015-05-21 DIAGNOSIS — I2510 Atherosclerotic heart disease of native coronary artery without angina pectoris: Secondary | ICD-10-CM | POA: Diagnosis present

## 2015-05-21 DIAGNOSIS — I472 Ventricular tachycardia: Secondary | ICD-10-CM | POA: Diagnosis not present

## 2015-05-21 DIAGNOSIS — I255 Ischemic cardiomyopathy: Secondary | ICD-10-CM | POA: Diagnosis present

## 2015-05-21 DIAGNOSIS — I110 Hypertensive heart disease with heart failure: Secondary | ICD-10-CM | POA: Diagnosis present

## 2015-05-21 DIAGNOSIS — R51 Headache: Secondary | ICD-10-CM | POA: Diagnosis present

## 2015-05-21 DIAGNOSIS — Z720 Tobacco use: Secondary | ICD-10-CM | POA: Diagnosis present

## 2015-05-21 DIAGNOSIS — F411 Generalized anxiety disorder: Secondary | ICD-10-CM | POA: Diagnosis present

## 2015-05-21 DIAGNOSIS — Z9114 Patient's other noncompliance with medication regimen: Secondary | ICD-10-CM | POA: Diagnosis not present

## 2015-05-21 DIAGNOSIS — Z79899 Other long term (current) drug therapy: Secondary | ICD-10-CM

## 2015-05-21 DIAGNOSIS — I5040 Unspecified combined systolic (congestive) and diastolic (congestive) heart failure: Secondary | ICD-10-CM | POA: Diagnosis not present

## 2015-05-21 DIAGNOSIS — Z9119 Patient's noncompliance with other medical treatment and regimen: Secondary | ICD-10-CM | POA: Diagnosis not present

## 2015-05-21 DIAGNOSIS — F17210 Nicotine dependence, cigarettes, uncomplicated: Secondary | ICD-10-CM | POA: Diagnosis present

## 2015-05-21 DIAGNOSIS — I252 Old myocardial infarction: Secondary | ICD-10-CM

## 2015-05-21 DIAGNOSIS — I5023 Acute on chronic systolic (congestive) heart failure: Secondary | ICD-10-CM | POA: Diagnosis not present

## 2015-05-21 DIAGNOSIS — Z7982 Long term (current) use of aspirin: Secondary | ICD-10-CM | POA: Diagnosis not present

## 2015-05-21 DIAGNOSIS — I509 Heart failure, unspecified: Secondary | ICD-10-CM | POA: Insufficient documentation

## 2015-05-21 DIAGNOSIS — M5430 Sciatica, unspecified side: Secondary | ICD-10-CM | POA: Diagnosis present

## 2015-05-21 DIAGNOSIS — E785 Hyperlipidemia, unspecified: Secondary | ICD-10-CM | POA: Diagnosis present

## 2015-05-21 DIAGNOSIS — I160 Hypertensive urgency: Secondary | ICD-10-CM | POA: Diagnosis present

## 2015-05-21 DIAGNOSIS — I5041 Acute combined systolic (congestive) and diastolic (congestive) heart failure: Secondary | ICD-10-CM | POA: Diagnosis not present

## 2015-05-21 DIAGNOSIS — I248 Other forms of acute ischemic heart disease: Secondary | ICD-10-CM | POA: Diagnosis present

## 2015-05-21 DIAGNOSIS — I5043 Acute on chronic combined systolic (congestive) and diastolic (congestive) heart failure: Secondary | ICD-10-CM | POA: Diagnosis present

## 2015-05-21 LAB — BRAIN NATRIURETIC PEPTIDE: B Natriuretic Peptide: 3492.5 pg/mL — ABNORMAL HIGH (ref 0.0–100.0)

## 2015-05-21 LAB — BASIC METABOLIC PANEL
Anion gap: 12 (ref 5–15)
BUN: 16 mg/dL (ref 6–20)
CO2: 24 mmol/L (ref 22–32)
Calcium: 9.6 mg/dL (ref 8.9–10.3)
Chloride: 106 mmol/L (ref 101–111)
Creatinine, Ser: 1.31 mg/dL — ABNORMAL HIGH (ref 0.61–1.24)
GFR calc Af Amer: >60 mL/min (ref >60)
GFR calc non Af Amer: >60 mL/min (ref >60)
Glucose, Bld: 97 mg/dL (ref 65–99)
Potassium: 4 mmol/L (ref 3.5–5.1)
Sodium: 142 mmol/L (ref 135–145)

## 2015-05-21 LAB — CBC WITH DIFFERENTIAL/PLATELET
Basophils Absolute: 0 10*3/uL (ref 0.0–0.1)
Basophils Relative: 0 %
Eosinophils Absolute: 0.1 10*3/uL (ref 0.0–0.7)
Eosinophils Relative: 1 %
HCT: 45.4 % (ref 39.0–52.0)
Hemoglobin: 15.2 g/dL (ref 13.0–17.0)
Lymphocytes Relative: 19 %
Lymphs Abs: 1.3 10*3/uL (ref 0.7–4.0)
MCH: 31.2 pg (ref 26.0–34.0)
MCHC: 33.5 g/dL (ref 30.0–36.0)
MCV: 93.2 fL (ref 78.0–100.0)
Monocytes Absolute: 1 10*3/uL (ref 0.1–1.0)
Monocytes Relative: 15 %
Neutro Abs: 4.4 10*3/uL (ref 1.7–7.7)
Neutrophils Relative %: 65 %
Platelets: 263 10*3/uL (ref 150–400)
RBC: 4.87 MIL/uL (ref 4.22–5.81)
RDW: 14.1 % (ref 11.5–15.5)
WBC: 6.8 10*3/uL (ref 4.0–10.5)

## 2015-05-21 LAB — TROPONIN I: Troponin I: 0.09 ng/mL — ABNORMAL HIGH (ref <0.031)

## 2015-05-21 MED ORDER — FUROSEMIDE 10 MG/ML IJ SOLN
40.0000 mg | Freq: Two times a day (BID) | INTRAMUSCULAR | Status: DC
  Administered 2015-05-21 – 2015-05-22 (×2): 40 mg via INTRAVENOUS
  Filled 2015-05-21 (×2): qty 4

## 2015-05-21 MED ORDER — ASPIRIN 81 MG PO CHEW
CHEWABLE_TABLET | ORAL | Status: AC
  Administered 2015-05-21: 81 mg
  Filled 2015-05-21: qty 4

## 2015-05-21 MED ORDER — DIAZEPAM 5 MG/ML IJ SOLN
5.0000 mg | Freq: Once | INTRAMUSCULAR | Status: AC
  Administered 2015-05-21: 5 mg via INTRAVENOUS
  Filled 2015-05-21: qty 2

## 2015-05-21 MED ORDER — FUROSEMIDE 10 MG/ML IJ SOLN
60.0000 mg | Freq: Once | INTRAMUSCULAR | Status: AC
  Administered 2015-05-21: 60 mg via INTRAVENOUS
  Filled 2015-05-21: qty 8

## 2015-05-21 MED ORDER — ONDANSETRON HCL 4 MG/2ML IJ SOLN: 4.0000 mg | Freq: Four times a day (QID) | INTRAMUSCULAR | Status: DC | PRN

## 2015-05-21 MED ORDER — NITROGLYCERIN IN D5W 200-5 MCG/ML-% IV SOLN
INTRAVENOUS | Status: AC
  Filled 2015-05-21: qty 250

## 2015-05-21 MED ORDER — ASPIRIN EC 81 MG PO TBEC
81.0000 mg | DELAYED_RELEASE_TABLET | ORAL | Status: DC
  Administered 2015-05-21: 81 mg via ORAL
  Filled 2015-05-21: qty 1

## 2015-05-21 MED ORDER — ADENOSINE 6 MG/2ML IV SOLN
INTRAVENOUS | Status: AC
  Filled 2015-05-21: qty 2

## 2015-05-21 MED ORDER — DIPHENHYDRAMINE HCL 50 MG/ML IJ SOLN
12.5000 mg | Freq: Once | INTRAMUSCULAR | Status: AC
  Administered 2015-05-21: 12.5 mg via INTRAVENOUS
  Filled 2015-05-21: qty 1

## 2015-05-21 MED ORDER — PROCHLORPERAZINE EDISYLATE 5 MG/ML IJ SOLN
10.0000 mg | Freq: Four times a day (QID) | INTRAMUSCULAR | Status: DC | PRN
  Administered 2015-05-21: 10 mg via INTRAVENOUS
  Filled 2015-05-21: qty 2

## 2015-05-21 MED ORDER — LISINOPRIL 10 MG PO TABS
10.0000 mg | ORAL_TABLET | Freq: Every day | ORAL | Status: DC
  Administered 2015-05-21 – 2015-05-22 (×2): 10 mg via ORAL
  Filled 2015-05-21 (×3): qty 1

## 2015-05-21 MED ORDER — PANTOPRAZOLE SODIUM 40 MG PO TBEC
40.0000 mg | DELAYED_RELEASE_TABLET | Freq: Every day | ORAL | Status: DC
  Administered 2015-05-21 – 2015-05-23 (×3): 40 mg via ORAL
  Filled 2015-05-21 (×3): qty 1

## 2015-05-21 MED ORDER — NITROGLYCERIN IN D5W 200-5 MCG/ML-% IV SOLN
0.0000 ug/min | Freq: Once | INTRAVENOUS | Status: AC
  Administered 2015-05-21: 66.667 ug/min via INTRAVENOUS

## 2015-05-21 MED ORDER — SIMVASTATIN 10 MG PO TABS
20.0000 mg | ORAL_TABLET | ORAL | Status: DC
  Administered 2015-05-21: 20 mg via ORAL
  Filled 2015-05-21: qty 2

## 2015-05-21 MED ORDER — HYDRALAZINE HCL 20 MG/ML IJ SOLN
10.0000 mg | Freq: Once | INTRAMUSCULAR | Status: AC
  Administered 2015-05-21: 10 mg via INTRAVENOUS
  Filled 2015-05-21: qty 1

## 2015-05-21 MED ORDER — KETOROLAC TROMETHAMINE 15 MG/ML IJ SOLN
15.0000 mg | Freq: Once | INTRAMUSCULAR | Status: AC
  Administered 2015-05-21: 15 mg via INTRAVENOUS
  Filled 2015-05-21: qty 1

## 2015-05-21 MED ORDER — ONDANSETRON HCL 4 MG PO TABS: 4.0000 mg | ORAL_TABLET | Freq: Four times a day (QID) | ORAL | Status: DC | PRN

## 2015-05-21 MED ORDER — CARVEDILOL 25 MG PO TABS
25.0000 mg | ORAL_TABLET | Freq: Once | ORAL | Status: AC
  Administered 2015-05-21: 25 mg via ORAL
  Filled 2015-05-21 (×2): qty 1

## 2015-05-21 MED ORDER — LISINOPRIL 10 MG PO TABS
10.0000 mg | ORAL_TABLET | Freq: Once | ORAL | Status: AC
  Administered 2015-05-21: 10 mg via ORAL
  Filled 2015-05-21: qty 1

## 2015-05-21 MED ORDER — ENOXAPARIN SODIUM 40 MG/0.4ML ~~LOC~~ SOLN
40.0000 mg | SUBCUTANEOUS | Status: DC
  Administered 2015-05-21 – 2015-05-22 (×2): 40 mg via SUBCUTANEOUS
  Filled 2015-05-21 (×2): qty 0.4

## 2015-05-21 MED ORDER — NITROSTAT 0.4 MG SL SUBL: SUBLINGUAL_TABLET | SUBLINGUAL | Status: DC

## 2015-05-21 NOTE — ED Notes (Signed)
Adenosine pulled for patient on an emergent verbal order per Dr. Wilson Singer.  Medication cancelled by Dr. Wilson Singer after cap was removed.  Medication was not punctured and returned to pharmacy.
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
## 2015-05-21 NOTE — ED Notes (Signed)
MD at bedside. 

## 2015-05-21 NOTE — ED Provider Notes (Signed)
Pt was evaluated by Dr Stanford Breed.  Pt is no longer on BIPAP.  Recently admitted by hospitalist service.  Requests hospitalist admission.  Dorie Rank, MD 05/21/15 [phone]

## 2015-05-21 NOTE — ED Notes (Signed)
Pt is tolerating Bi-Pap well and is breathing without difficulty.  O2 saturation 100%, breathing unlabored with symmetrical chest expansion.  He is alert and responding to questions, no signs of acute distress noted.

## 2015-05-21 NOTE — Telephone Encounter (Signed)
Patient request refill for Nitro sent to patients pharmacy of record  Patient also says he takes a medication for spasms by another doctor and he does not have a primary care doctor Medication is not in our medication record. Advised patient that the best would be to be seen in an urgent care center today so they can provide the medication you have had in the past for this symptom Patient agrees to plan.
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
## 2015-05-21 NOTE — ED Notes (Signed)
Patient reports feeling like he is smothering when lying flat.

## 2015-05-21 NOTE — Telephone Encounter (Signed)
New message      Pt states he has not slept since yesterday.  He does not have a PCP. He want to know if Dr [name] will call him in something to help him sleep.
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
## 2015-05-21 NOTE — H&P (Addendum)
[facility] History and Physical  Roger Martinez [id number] DOB: February 01, 1966 DOA: 05/21/2015  Referring physician: ED physician, Dr. Tomi Bamberger PCP: Candee Furbish, MD   Chief Complaint: dyspnea   HPI:  Patient is 50 year old male with known history of coronary artery disease status post CABG, ischemic cardiomyopathy, hypertension, medical noncompliance, last echocardiogram January 2017 with ejection fraction 25-30% and grade 2 diastolic CHF, mild to moderate aortic insufficiency and mitral regurgitation, recently admitted for evaluation of nausea and vomiting that was thought to be secondary to pneumonia but he left [place] and has not taken the medications as prescribed. He now presents to [facility] long emergency department with main concern of several days duration of progressively worsening dyspnea that initially started with exertion and has progressed to dyspnea at rest in the past 24 hours. This has been associated with worsening orthopnea. Patient denies significant weight gain or weight loss, no lower extremity swelling as far as he is aware.  In ED, pt noted to be in mild distress due to dyspnea, cardiology consulted. [facility] asked to admit for further evaluation. Please note initial blood pressure was 216/134 and repeated blood pressure 142/84. Stepdown unit bed requested.  Assessment and Plan: Acute on chronic systolic and diastolic congestive heart failure - In patient with known medical noncompliance - Cardiology consulted, recommended Lasix 40 mg IV twice a day and follow renal function closely - Admit to step down unit, cycle cardiac enzymes - Monitor daily weights, strict input and urine output - Appreciate cardiology team assistance  Ischemic cardiomyopathy - Resume lisinopril - As congestive heart failure improves, plan on resuming Coreg  Hypertensive urgency - Resume ACE inhibitor, diuresis with Lasix as noted above and IV nitroglycerin - Plan to add Coreg  congestive heart failure improves  Tobacco abuse  - Consult on cessation  Acute kidney injury - Monitor closely while on Lasix and lisinopril  Lovenox Sq for DVT prophylaxis   Radiological Exams on Admission: Dg Chest 2 View 05/21/2015 Cardiomegaly with bilateral interstitial thickening and patchy right perihilar alveolar airspace opacities. Differential considerations include mild pulmonary vascular congestion with possible superimposed right perihilar pneumonia.   Code Status: Full Family Communication: Pt and wife at bedside Disposition Plan: Admit for further evaluation    [facility] [id number]   Review of Systems:  Constitutional: Negative for fever, chills and malaise/fatigue. Negative for diaphoresis.  HENT: Negative for hearing loss, ear pain, nosebleeds, congestion, sore throat, neck pain, tinnitus and ear discharge.   Eyes: Negative for blurred vision, double vision, photophobia, pain, discharge and redness.  Respiratory: Negative for cough, hemoptysis, sputum production, shortness of breath, wheezing and stridor.   Cardiovascular: Negative for chest pain, palpitations, orthopnea, claudication and leg swelling.  Gastrointestinal: Negative for nausea, vomiting and abdominal pain. Negative for heartburn, constipation, blood in stool and melena.  Genitourinary: Negative for dysuria, urgency, frequency, hematuria and flank pain.  Musculoskeletal: Negative for myalgias, back pain, joint pain and falls.  Skin: Negative for itching and rash.  Neurological: Negative for dizziness and weakness. Negative for tingling, tremors, sensory change, speech change, focal weakness, loss of consciousness and headaches.  Endo/Heme/Allergies: Negative for environmental allergies and polydipsia. Does not bruise/bleed easily.  Psychiatric/Behavioral: Negative for suicidal ideas. The patient is not nervous/anxious.      Past Medical History  Diagnosis Date  . Coronary artery disease     a.  2011 s/p CABG x 3.  . Hypertensive heart disease with heart failure ([place])   . Chronic combined systolic (congestive)  and diastolic (congestive) heart failure ([place])     a. 03/2014 Echo: EF 20-25%, diff HK, Gr 2 DD, mild AI, sev dil LA, mod reduced RV fxn, PASP 56mmHg.  . Ischemic cardiomyopathy     a. 03/2014 Echo: EF 20-25%;  b. 06/2014 s/p BSX single lead AICD, ser # [id number].  Marland Kitchen Anxiety state, unspecified   . MI (myocardial infarction) ([place]) 03/2011  . STD (sexually transmitted disease)   . Hyperlipidemia   . Tobacco abuse   . Noncompliance   . Marijuana abuse     Past Surgical History  Procedure Laterality Date  . Bypass graft  2011    3 vessel  . Ep implantable device  06/17/2014    DR [name]  . Implantable cardioverter defibrillator implant N/A 06/17/2014    Procedure: IMPLANTABLE CARDIOVERTER DEFIBRILLATOR IMPLANT;  Surgeon: Deboraha Sprang, MD;  Location: [facility] CATH LAB;  Service: Cardiovascular;  Laterality: N/A;    Social History:  reports that he has quit smoking. He has never used smokeless tobacco. He reports that he uses illicit drugs (Marijuana). He reports that he does not drink alcohol.  No Known Allergies  Family History  Problem Relation Age of Onset  . Hypertension Mother     deceased.  . Diabetes Mother   . Other Father     alive - pt unaware of medical hx.    Prior to Admission medications   Medication Sig Start Date End Date Taking? Authorizing Provider  aspirin EC 81 MG tablet Take 81 mg by mouth every 3 (three) days.    Yes Historical Provider, MD  carvedilol (COREG) 25 MG tablet Take 1 tablet (25 mg total) by mouth 2 (two) times daily with a meal. Patient taking differently: Take 25 mg by mouth every 3 (three) days.  06/14/14  Yes Jerline Pain, MD  furosemide (LASIX) 40 MG tablet Take 1 tablet (40 mg total) by mouth daily. 05/08/15  Yes Theodis Blaze, MD  lisinopril (PRINIVIL,ZESTRIL) 10 MG tablet Take 1 tablet (10 mg total) by mouth daily. 05/08/15  Yes Theodis Blaze, MD  NITROSTAT 0.4 MG SL tablet PLACE ONE TABLET UNDER THE TONGUE EVERY 5 MINUTES AS NEEDED FOR CHEST PAIN to total of three 05/21/15  Yes Leonie Man, MD  pantoprazole (PROTONIX) 40 MG tablet Take 1 tablet (40 mg total) by mouth daily. 05/08/15  Yes Theodis Blaze, MD  potassium chloride SA (K-DUR,KLOR-CON) 20 MEQ tablet Take 20 mEq by mouth every 3 (three) days.    Yes Historical Provider, MD  simvastatin (ZOCOR) 20 MG tablet Take 20 mg by mouth every 3 (three) days.    Yes Historical Provider, MD  doxycycline (VIBRAMYCIN) 100 MG capsule Take 1 capsule (100 mg total) by mouth 2 (two) times daily. Patient not taking: Reported on 05/21/2015 05/08/15   Theodis Blaze, MD    Physical Exam: Filed Vitals:   05/21/15 1506 05/21/15 1514 05/21/15 1630 05/21/15 1700  BP: 216/134 198/136 139/88 134/102  Pulse:  110 47 93  Temp:      TempSrc:      Resp:  25 27 25   SpO2:  99% 100% 100%    Physical Exam  Constitutional: Appears well-developed and well-nourished. No distress.  HENT: Normocephalic. External right and left ear normal. Oropharynx is clear and moist.  Eyes: Conjunctivae and EOM are normal. PERRLA, no scleral icterus.  Neck: Normal ROM. Neck supple. No tracheal deviation. No thyromegaly.  CVS: RRR, S1/S2 +, no gallops,  no carotid bruit.  Pulmonary: Effort and breath sounds normal, crackles at bases  Abdominal: Soft. BS +,  no distension, tenderness, rebound or guarding.  Musculoskeletal: Normal range of motion.  Lymphadenopathy: No lymphadenopathy noted, cervical, inguinal. Neuro: Alert. Normal reflexes, muscle tone coordination. No cranial nerve deficit. Skin: Skin is warm and dry. No rash noted. Not diaphoretic. No erythema. No pallor.  Psychiatric: Normal mood and affect. Behavior, judgment, thought content normal.   Labs on Admission:  Basic Metabolic Panel:  Recent Labs Lab 05/21/15 1457  NA 142  K 4.0  CL 106  CO2 24  GLUCOSE 97  BUN 16  CREATININE 1.31*   CALCIUM 9.6   CBC:  Recent Labs Lab 05/21/15 1457  WBC 6.8  NEUTROABS 4.4  HGB 15.2  HCT 45.4  MCV 93.2  PLT 263   Cardiac Enzymes:  Recent Labs Lab 05/21/15 1457  TROPONINI 0.09*   EKG: pending   If 7PM-7AM, please contact night-coverage [email or web address] Password [facility] 05/21/2015, 5:38 PM

## 2015-05-21 NOTE — Telephone Encounter (Signed)
Follow up       *STAT* If patient is at the pharmacy, call can be transferred to refill team.   1. Which medications need to be refilled? (please list name of each medication and dose if known) nitrostat 2. Which pharmacy/location (including street and city if local pharmacy) is medication to be sent to? [facility] at [address] 3. Do they need a 30 day or 90 day supply? Not sure

## 2015-05-21 NOTE — Consult Note (Signed)
Primary cardiologist: Dr [name]  HPI: 50 year old male with history of noncompliance, coronary artery disease status post coronary artery bypass graft, ischemic cardiomyopathy,, hypertension, hyperlipidemia for evaluation of acute on chronic systolic congestive heart failure. Patient had three-vessel coronary artery bypass graft in 2011.  He has had problems with noncompliance. He has had previous ICD placed. Last echocardiogram January 2017 showed ejection fraction 82-95%, grade 2 diastolic dysfunction, mild to moderate aortic insufficiency, mild mitral regurgitation, severe left atrial enlargement and mild left atrial enlargement. Moderate tricuspid regurgitation. Patient recently left AGAINST MEDICAL ADVICE following admission for nausea and vomiting. He was felt to have pneumonia at that time. He has not taken his medications over the past 3 days. Over the past 2 days he has noticed progressive dyspnea on exertion progressing to rest. He notes orthopnea and no pedal edema. He denies chest pain, fevers or chills or productive cough. No hemoptysis. At time of evaluation patient was on BiPAP. He removed this  As it was uncomfortable.   (Not in a hospital admission)  No Known Allergies  Past Medical History  Diagnosis Date  . Coronary artery disease     a. 2011 s/p CABG x 3.  . Hypertensive heart disease with heart failure ([place])   . Chronic combined systolic (congestive) and diastolic (congestive) heart failure ([place])     a. 03/2014 Echo: EF 20-25%, diff HK, Gr 2 DD, mild AI, sev dil LA, mod reduced RV fxn, PASP 64mHg.  . Ischemic cardiomyopathy     a. 03/2014 Echo: EF 20-25%;  b. 06/2014 s/p BSX single lead AICD, ser # [id number]  .Marland KitchenAnxiety state, unspecified   . MI (myocardial infarction) ([place] 03/2011  . STD (sexually transmitted disease)   . Hyperlipidemia   . Tobacco abuse   . Noncompliance   . Marijuana abuse     Past Surgical History  Procedure Laterality Date  . Bypass graft   2011    3 vessel  . Ep implantable device  06/17/2014    DR KCaryl Comes . Implantable cardioverter defibrillator implant N/A 06/17/2014    Procedure: IMPLANTABLE CARDIOVERTER DEFIBRILLATOR IMPLANT;  Surgeon: SDeboraha Sprang MD;  [facility];  Service: Cardiovascular;  Laterality: N/A;    Social History   Social History  . Marital Status: Legally Separated    Spouse Name: N/A  . Number of Children: N/A  . Years of Education: N/A   Occupational History  . Not on file.   Social History Main Topics  . Smoking status: Former Smoker -- 0.00 packs/day for 20 years  . Smokeless tobacco: Never Used  . Alcohol Use: No  . Drug Use: Yes    Special: Marijuana     Comment: none for 2 weeks  . Sexual Activity: Not on file   Other Topics Concern  . Not on file   Social History Narrative   [place]  [place] not routinely exercise.  Unemployed.                   Family History  Problem Relation Age of Onset  . Hypertension Mother     deceased.  . Diabetes Mother   . Other Father     alive - pt unaware of medical hx.    ROS:  no fevers or chills, productive cough, hemoptysis, dysphasia, odynophagia, melena, hematochezia, dysuria, hematuria, rash, seizure activity, PND, pedal edema, claudication. Remaining systems are negative.  Physical Exam:   Blood pressure  198/136, pulse 110, temperature 98.3 F (36.8 C), temperature source Oral, resp. rate 25, SpO2 99 %.  General:  Well developed/well nourished in NAD; mildly diaphoretic Skin warm/dry Patient not depressed No peripheral clubbing Back-normal HEENT-normal/normal eyelids Neck supple/normal carotid upstroke bilaterally; no bruits; no thyromegaly chest - CTA/ normal expansion; ICD in place CV - RRR/normal S1 and S2; no murmurs, rubs or gallops;  PMI nondisplaced Abdomen -NT/ND, no HSM, no mass, + bowel sounds, no bruit 2+ femoral pulses, no bruits Ext-no edema, chords, 2+ DP Neuro-grossly  nonfocal  ECG Sinus tachycardia with occasional PVC. Left atrial enlargement. Right axis deviation. Nonspecific ST changes.  Results for orders placed or performed during the hospital encounter of 05/21/15 (from the past 48 hour(s))  CBC with Differential     Status: None   Collection Time: 05/21/15  2:57 PM  Result Value Ref Range   WBC 6.8 4.0 - 10.5 K/uL   RBC 4.87 4.22 - 5.81 MIL/uL   Hemoglobin 15.2 13.0 - 17.0 g/dL   HCT 45.4 39.0 - 52.0 %   MCV 93.2 78.0 - 100.0 fL   MCH 31.2 26.0 - 34.0 pg   MCHC 33.5 30.0 - 36.0 g/dL   RDW 14.1 11.5 - 15.5 %   Platelets 263 150 - 400 K/uL   Neutrophils Relative % 65 %   Neutro Abs 4.4 1.7 - 7.7 K/uL   Lymphocytes Relative 19 %   Lymphs Abs 1.3 0.7 - 4.0 K/uL   Monocytes Relative 15 %   Monocytes Absolute 1.0 0.1 - 1.0 K/uL   Eosinophils Relative 1 %   Eosinophils Absolute 0.1 0.0 - 0.7 K/uL   Basophils Relative 0 %   Basophils Absolute 0.0 0.0 - 0.1 K/uL  Basic metabolic panel     Status: Abnormal   Collection Time: 05/21/15  2:57 PM  Result Value Ref Range   Sodium 142 135 - 145 mmol/L   Potassium 4.0 3.5 - 5.1 mmol/L   Chloride 106 101 - 111 mmol/L   CO2 24 22 - 32 mmol/L   Glucose, Bld 97 65 - 99 mg/dL   BUN 16 6 - 20 mg/dL   Creatinine, Ser 1.31 (H) 0.61 - 1.24 mg/dL   Calcium 9.6 8.9 - 10.3 mg/dL   GFR calc non Af Amer >60 >60 mL/min   GFR calc Af Amer >60 >60 mL/min    Comment: (NOTE) The eGFR has been calculated using the CKD EPI equation. This calculation has not been validated in all clinical situations. eGFR's persistently <60 mL/min signify possible Chronic Kidney Disease.    Anion gap 12 5 - 15  Brain natriuretic peptide     Status: Abnormal   Collection Time: 05/21/15  2:57 PM  Result Value Ref Range   B Natriuretic Peptide 3492.5 (H) 0.0 - 100.0 pg/mL  Troponin I     Status: Abnormal   Collection Time: 05/21/15  2:57 PM  Result Value Ref Range   Troponin I 0.09 (H) <0.031 ng/mL    Comment:          PERSISTENTLY INCREASED TROPONIN VALUES IN THE RANGE OF 0.04-0.49 ng/mL CAN BE SEEN IN:       -UNSTABLE ANGINA       -CONGESTIVE HEART FAILURE       -MYOCARDITIS       -CHEST TRAUMA       -ARRYHTHMIAS       -LATE PRESENTING MYOCARDIAL INFARCTION       -COPD   CLINICAL  FOLLOW-UP RECOMMENDED.     Dg Chest 2 View  05/21/2015  CLINICAL DATA:  Shortness of breath, productive cough for 2 weeks EXAM: CHEST  2 VIEW COMPARISON:  05/06/2015 FINDINGS: There is bilateral interstitial thickening and patchy right perihilar alveolar airspace opacities. There is no pleural effusion or pneumothorax. There is stable cardiomegaly. There is a single lead cardiac pacemaker. There is evidence of prior CABG. The osseous structures are unremarkable. IMPRESSION: 1. Cardiomegaly with bilateral interstitial thickening and patchy right perihilar alveolar airspace opacities. Differential considerations include mild pulmonary vascular congestion with possible superimposed right perihilar pneumonia. Electronically Signed   By: Kathreen Devoid   On: 05/21/2015 14:44    Assessment/Plan 1 acute on chronic systolic congestive heart failure-the patient presents with acute systolic heart failure.  He has not taken his medications in the past 3 days and his presentation is related to noncompliance. Would diurese with Lasix 40 mg IV twice a day and follow renal function closely. We'll continue IV nitroglycerin for now. Cycle enzymes but event does not appear to be ischemia mediated. 2 ischemic cardiomyopathy-would resume lisinopril. As congestive heart failure improves will resume carvedilol. We will avoid entresto as he states this medication has been too expensive. 3 Hypertensive urgency-we will treat with ACE inhibitor, diuresis and IV nitroglycerin. Resume  Coreg as CHF improves. 4 noncompliance-patient instructed on the importance of medication compliance. He will need a case manager consult at time of discharge for medication  assistance. 5. Tobacco abuse-patient counseled on discontinuing. 6 coronary artery disease-continue aspirin and statin.  Kirk Ruths MD 05/21/2015, 4:49 PM

## 2015-05-21 NOTE — Progress Notes (Signed)
RT NOTE: Pt is resting at this time. BIPAP ordered if pt needs therapy.

## 2015-05-22 ENCOUNTER — Other Ambulatory Visit: Payer: Self-pay

## 2015-05-22 DIAGNOSIS — Z9119 Patient's noncompliance with other medical treatment and regimen: Secondary | ICD-10-CM

## 2015-05-22 DIAGNOSIS — I160 Hypertensive urgency: Secondary | ICD-10-CM

## 2015-05-22 DIAGNOSIS — I2510 Atherosclerotic heart disease of native coronary artery without angina pectoris: Secondary | ICD-10-CM

## 2015-05-22 DIAGNOSIS — I5043 Acute on chronic combined systolic (congestive) and diastolic (congestive) heart failure: Secondary | ICD-10-CM

## 2015-05-22 DIAGNOSIS — I509 Heart failure, unspecified: Secondary | ICD-10-CM | POA: Insufficient documentation

## 2015-05-22 LAB — BASIC METABOLIC PANEL
ANION GAP: 13 (ref 5–15)
BUN: 19 mg/dL (ref 6–20)
CO2: 25 mmol/L (ref 22–32)
CREATININE: 1.24 mg/dL (ref 0.61–1.24)
Calcium: 9 mg/dL (ref 8.9–10.3)
Chloride: 105 mmol/L (ref 101–111)
Glucose, Bld: 99 mg/dL (ref 65–99)
POTASSIUM: 3.5 mmol/L (ref 3.5–5.1)
Sodium: 143 mmol/L (ref 135–145)

## 2015-05-22 LAB — TROPONIN I
Troponin I: 0.12 ng/mL — ABNORMAL HIGH (ref <0.031)
Troponin I: 0.14 ng/mL — ABNORMAL HIGH (ref <0.031)
Troponin I: 0.14 ng/mL — ABNORMAL HIGH (ref <0.031)

## 2015-05-22 LAB — CBC
HCT: 38.9 % — ABNORMAL LOW (ref 39.0–52.0)
Hemoglobin: 12.9 g/dL — ABNORMAL LOW (ref 13.0–17.0)
MCH: 30.5 pg (ref 26.0–34.0)
MCHC: 33.2 g/dL (ref 30.0–36.0)
MCV: 92 fL (ref 78.0–100.0)
PLATELETS: 226 10*3/uL (ref 150–400)
RBC: 4.23 MIL/uL (ref 4.22–5.81)
RDW: 14 % (ref 11.5–15.5)
WBC: 6.8 10*3/uL (ref 4.0–10.5)

## 2015-05-22 LAB — MRSA PCR SCREENING: MRSA by PCR: NEGATIVE

## 2015-05-22 MED ORDER — FUROSEMIDE 40 MG PO TABS
40.0000 mg | ORAL_TABLET | Freq: Two times a day (BID) | ORAL | Status: DC
  Administered 2015-05-22 – 2015-05-23 (×2): 40 mg via ORAL
  Filled 2015-05-22 (×2): qty 1

## 2015-05-22 MED ORDER — CARVEDILOL 12.5 MG PO TABS
25.0000 mg | ORAL_TABLET | Freq: Two times a day (BID) | ORAL | Status: DC
Start: 2015-05-22 — End: 2015-05-23
  Administered 2015-05-22 – 2015-05-23 (×2): 25 mg via ORAL
  Filled 2015-05-22 (×2): qty 2

## 2015-05-22 MED ORDER — HYDRALAZINE HCL 20 MG/ML IJ SOLN
10.0000 mg | INTRAMUSCULAR | Status: DC | PRN
  Administered 2015-05-22 – 2015-05-23 (×3): 10 mg via INTRAVENOUS
  Filled 2015-05-22 (×3): qty 1

## 2015-05-22 MED ORDER — HYDRALAZINE HCL 20 MG/ML IJ SOLN: 10.0000 mg | Freq: Once | INTRAMUSCULAR | Status: AC

## 2015-05-22 MED ORDER — POTASSIUM CHLORIDE CRYS ER 20 MEQ PO TBCR
40.0000 meq | EXTENDED_RELEASE_TABLET | Freq: Once | ORAL | Status: AC
  Administered 2015-05-22: 40 meq via ORAL
  Filled 2015-05-22: qty 2

## 2015-05-22 MED ORDER — HYDRALAZINE HCL 25 MG PO TABS
25.0000 mg | ORAL_TABLET | Freq: Three times a day (TID) | ORAL | Status: DC
  Administered 2015-05-22 – 2015-05-23 (×3): 25 mg via ORAL
  Filled 2015-05-22 (×3): qty 1

## 2015-05-22 NOTE — Progress Notes (Signed)
Patient ID: Fynn Reves, male   DOB: 1965/08/21, 50 y.o.   MRN: [id number]  [facility] PROGRESS NOTE  Esiquio Pidcock [id number] DOB: 1966/01/13 DOA: 05/21/2015 PCP: Candee Furbish, MD   Brief narrative:    Patient is 50 year old male with known history of coronary artery disease status post CABG, ischemic cardiomyopathy, hypertension, medical noncompliance, last echocardiogram January 2017 with ejection fraction 25-30% and grade 2 diastolic CHF, mild to moderate aortic insufficiency and mitral regurgitation, recently admitted for evaluation of nausea and vomiting that was thought to be secondary to pneumonia but he left [place] and has not taken the medications as prescribed. He now presents to [facility] long emergency department with main concern of several days duration of progressively worsening dyspnea that initially started with exertion and has progressed to dyspnea at rest in the past 24 hours. This has been associated with worsening orthopnea. Patient denies significant weight gain or weight loss, no lower extremity swelling as far as he is aware.  In ED, pt noted to be in mild distress due to dyspnea, cardiology consulted. [facility] asked to admit for further evaluation. Please note initial blood pressure was 216/134 and repeated blood pressure 142/84. Stepdown unit bed requested.  Assessment/Plan:    Acute on chronic systolic and diastolic congestive heart failure - In patient with known medical noncompliance - Cardiology consulted, recommended Lasix 40 mg IV twice a day and follow renal function closely - renal function remains stable, pt reports feeling much better this AM - plan to add Coreg  - Monitor daily weights, strict input and urine output - Appreciate cardiology team assistance  Ischemic cardiomyopathy - Resumed lisinopril - As congestive heart failure improves, plan on resuming Coreg  Hypertensive urgency - Resume ACE inhibitor, diuresis with Lasix as  noted above - Plan to add Coreg, hydralazine   Tobacco abuse  - Consult on cessation  Acute kidney injury - Monitor closely while on Lasix and lisinopril  Lovenox Sq for DVT prophylaxis    Code Status: Full.  Family Communication:  plan of care discussed with the patient Disposition Plan: Home when cleared by cardiology   IV access:  Peripheral IV  Procedures and diagnostic studies:    Dg Chest 2 View 05/21/2015 Cardiomegaly with bilateral interstitial thickening and patchy right perihilar alveolar airspace opacities. Differential considerations include mild pulmonary vascular congestion with possible superimposed right perihilar pneumonia.   Dg Esophagus 05/08/2015  Limited study, as above, considered nondiagnostic. The patient should not be charged a professional fee for the interpretation of this examination.  Medical Consultants:  Cardiology   Other Consultants:  None  IAnti-Infectives:   None  Faye Ramsay, MD  [facility] Pager [phone]  If 7PM-7AM, please contact night-coverage [email or web address] Password [email or web address] 05/22/2015, 8:17 PM   LOS: 1 day   HPI/Subjective: No events overnight.   Objective: Filed Vitals:   05/22/15 1511 05/22/15 1600 05/22/15 1620 05/22/15 1938  BP: 193/111 148/95    Pulse: 78 72    Temp:   98.4 F (36.9 C) 99 F (37.2 C)  TempSrc:   Oral Oral  Resp: 18     Height:      Weight:      SpO2: 98% 98%      Intake/Output Summary (Last 24 hours) at 05/22/15 2017 Last data filed at 05/22/15 1833  Gross per 24 hour  Intake    720 ml  Output   1200 ml  Net   -480 ml    Exam:   General:  Pt is alert, follows commands appropriately, not in acute distress  Cardiovascular: Regular rate and rhythm, S1/S2, no murmurs, no rubs, no gallops  Respiratory: Clear to auscultation bilaterally, no wheezing, mild crackles at bases   Abdomen: Soft, non tender, non distended, bowel sounds present, no guarding  Extremities: pulses DP and PT  palpable bilaterally  Neuro: Grossly nonfocal  Data Reviewed: Basic Metabolic Panel:  Recent Labs Lab 05/21/15 1457 05/22/15 0153  NA 142 143  K 4.0 3.5  CL 106 105  CO2 24 25  GLUCOSE 97 99  BUN 16 19  CREATININE 1.31* 1.24  CALCIUM 9.6 9.0   CBC:  Recent Labs Lab 05/21/15 1457 05/22/15 0153  WBC 6.8 6.8  NEUTROABS 4.4  --   HGB 15.2 12.9*  HCT 45.4 38.9*  MCV 93.2 92.0  PLT 263 226   Cardiac Enzymes:  Recent Labs Lab 05/21/15 1457 05/22/15 0153 05/22/15 0826 05/22/15 1442  TROPONINI 0.09* 0.12* 0.14* 0.14*     Recent Results (from the past 240 hour(s))  MRSA PCR Screening     Status: None   Collection Time: 05/21/15  9:30 PM  Result Value Ref Range Status   MRSA by PCR NEGATIVE NEGATIVE Final    Comment:        The GeneXpert MRSA Assay (FDA approved for NASAL specimens only), is one component of a comprehensive MRSA colonization surveillance program. It is not intended to diagnose MRSA infection nor to guide or monitor treatment for MRSA infections.      Scheduled Meds: . aspirin EC  81 mg Oral Q3 days  . enoxaparin (LOVENOX) injection  40 mg Subcutaneous Q24H  . furosemide  40 mg Oral BID  . hydrALAZINE  25 mg Oral TID  . lisinopril  10 mg Oral Daily  . pantoprazole  40 mg Oral Daily  . simvastatin  20 mg Oral Q3 days   Continuous Infusions:

## 2015-05-22 NOTE — Care Management Note (Signed)
Case Management Note  Patient Details  Name: Roger Martinez MRN: [id number] Date of Birth: 02-02-1966  Subjective/Objective:         Chf, pna, positive troponins had iv ntg on admission o2 at 80%           Action/Plan:Date: May 22, 2015 Chart reviewed for concurrent status and case management needs. Will continue to follow patient for changes and needs: Velva Harman, BSN, RN, [place]   [phone]   Expected Discharge Date:   ( unknown)               Expected Discharge Plan:  Home/Self Care  In-House Referral:  Clinical Social Work  Discharge planning Services  CM Consult  Post Acute Care Choice:  NA Choice offered to:  NA  DME Arranged:    DME Agency:     HH Arranged:    [place] Agency:     Status of Service:  Completed, signed off  Medicare Important Message Given:    Date Medicare IM Given:    Medicare IM give by:    Date Additional Medicare IM Given:    Additional Medicare Important Message give by:     If discussed at [place] of Stay Meetings, dates discussed:    Additional Comments:  Leeroy Cha, RN 05/22/2015, 11:43 AM

## 2015-05-22 NOTE — Progress Notes (Addendum)
Initial Nutrition Assessment  DOCUMENTATION CODES:   Not applicable  INTERVENTION:  Pt declined ONS at this time Continue to monitor for needs   NUTRITION DIAGNOSIS:   Inadequate oral intake related to acute illness, nausea, vomiting (non-compliance with medications.) as evidenced by per patient/family report.  GOAL:   Patient will meet greater than or equal to 90% of their needs  MONITOR:   PO intake, I & O's, Labs  REASON FOR ASSESSMENT:   Malnutrition Screening Tool    ASSESSMENT:   Patient is 50 year old male with known history of coronary artery disease status post CABG, ischemic cardiomyopathy, hypertension, medical noncompliance, last echocardiogram January 2017 with ejection fraction 25-30% and grade 2 diastolic CHF, mild to moderate aortic insufficiency and mitral regurgitation, recently admitted for evaluation of nausea and vomiting that was thought to be secondary to pneumonia but he left [place] and has not taken the medications as prescribed.  Spoke with pt briefly at bedside. Pt endorses feeling bad, taking meds and throwing up at home, which led to 3 days of him not taking his meds and not eating. Pt endorses some wt loss from previous admit, but it appears his wt is up to 187# from 173# on 1/24. He still exhibits a 7% insignificant wt loss over 7 months from his weight of 201# as of 03/2015.  Pt stated he had breakfast and lunch, PO was ~100% no nausea/vomiting or other tolerance issues.  Pt declined ONS. Nutrition-Focused physical exam completed. Findings are no fat depletion, no muscle depletion, and no edema.  No further RD interventions necessary Labs and Medications reviewed.     Diet Order:  Diet Heart Room service appropriate?: Yes; Fluid consistency:: Thin  Skin:  Reviewed, no issues  Last BM:  2/7  Height:   Ht Readings from Last 1 Encounters:  05/21/15 5\' 11"  (1.803 m)    Weight:   Wt Readings from Last 1 Encounters:   05/22/15 187 lb 9.8 oz (85.1 kg)    Ideal Body Weight:  78.18 kg  BMI:  Body mass index is 26.18 kg/(m^2).  Estimated Nutritional Needs:   Kcal:  2050-2250  Protein:  75-85 grams  Fluid:  >/= 2.2L  EDUCATION NEEDS:   No education needs identified at this time  Satira Anis. Honest Vanleer, MS, RD LDN After Hours/Weekend Pager [phone]

## 2015-05-22 NOTE — Progress Notes (Signed)
Patient Name: Roger Martinez Date of Encounter: 05/22/2015  Hospital Problem List     Principal Problem:   Acute on chronic combined systolic and diastolic congestive heart failure, NYHA class 3 (HCC) Active Problems:   H/O medication noncompliance   CAD, multiple vessel s/p CABG 2011    Hypertensive urgency   Ischemic cardiomyopathy   Tobacco abuse disorder    Subjective   Breathing better.  BP 150's to 160's.  He is apologetic for missing meds. Has been feeling poorly r/t cold Ss.  Was not taking OTC cold remedies.    Inpatient Medications    . aspirin EC  81 mg Oral Q3 days  . enoxaparin (LOVENOX) injection  40 mg Subcutaneous Q24H  . furosemide  40 mg Intravenous BID  . hydrALAZINE  10 mg Intravenous Once  . lisinopril  10 mg Oral Daily  . pantoprazole  40 mg Oral Daily  . simvastatin  20 mg Oral Q3 days    Vital Signs    Filed Vitals:   05/22/15 0600 05/22/15 0700 05/22/15 0800 05/22/15 0937  BP: 166/82 168/97 155/93 164/101  Pulse: 55 69 64 63  Temp:   98.2 F (36.8 C)   TempSrc:   Oral   Resp: 14 21 17 10   Height:      Weight:      SpO2: 99% 95% 96% 97%    Intake/Output Summary (Last 24 hours) at 05/22/15 0953 Last data filed at 05/22/15 0903  Gross per 24 hour  Intake    240 ml  Output   1200 ml  Net   -960 ml   Filed Weights   05/21/15 1945 05/22/15 0500  Weight: 187 lb 2.7 oz (84.9 kg) 187 lb 9.8 oz (85.1 kg)    Physical Exam    General: Pleasant, NAD. Neuro: Alert and oriented X 3. Moves all extremities spontaneously. Psych: Normal affect. HEENT:  Normal  Neck: Supple without bruits.  JVP ~ 10cm. Lungs:  Resp regular and unlabored, bibasilar crackles. Heart: RRR no s3, s4, or murmurs. Abdomen: Soft, non-tender, non-distended, BS + x 4.  Extremities: No clubbing, cyanosis or edema. DP/PT/Radials 2+ and equal bilaterally.  Labs    CBC  Recent Labs  05/21/15 1457 05/22/15 0153  WBC 6.8 6.8  NEUTROABS 4.4  --   HGB 15.2 12.9*    HCT 45.4 38.9*  MCV 93.2 92.0  PLT 263 [id number]   Basic Metabolic Panel  Recent Labs  05/21/15 1457 05/22/15 0153  NA 142 143  K 4.0 3.5  CL 106 105  CO2 24 25  GLUCOSE 97 99  BUN 16 19  CREATININE 1.31* 1.24  CALCIUM 9.6 9.0   Cardiac Enzymes  Recent Labs  05/21/15 1457 05/22/15 0153 05/22/15 0826  TROPONINI 0.09* 0.12* 0.14*    Telemetry    Rsr, several brief runs of NSVT (9-11 beats).  One prolonged run of VT ~ 20:40 PM asymptomatic.  Radiology    Dg Chest 2 View  05/21/2015  CLINICAL DATA:  Shortness of breath, productive cough for 2 weeks EXAM: CHEST  2 VIEW COMPARISON:  05/06/2015 FINDINGS: There is bilateral interstitial thickening and patchy right perihilar alveolar airspace opacities. There is no pleural effusion or pneumothorax. There is stable cardiomegaly. There is a single lead cardiac pacemaker. There is evidence of prior CABG. The osseous structures are unremarkable. IMPRESSION: 1. Cardiomegaly with bilateral interstitial thickening and patchy right perihilar alveolar airspace opacities. Differential considerations include mild pulmonary vascular congestion with possible  superimposed right perihilar pneumonia. Electronically Signed   By: Kathreen Devoid   On: 05/21/2015 14:44   2D Echocardiogram 1.26.2017  Study Conclusions  - Left ventricle: The cavity size was mildly dilated. There was severe concentric hypertrophy with more hypetrophy of the septum. Systolic function was severely reduced. The estimated ejection fraction was in the range of 25% to 30%. Diffuse hypokinesis. Features are consistent with a pseudonormal left ventricular filling pattern, with concomitant abnormal relaxation and increased filling pressure (grade 2 diastolic dysfunction). Doppler parameters are consistent with high ventricular filling pressure. - Aortic valve: There was mild to moderate regurgitation. Regurgitation pressure half-time: 541 ms. - Mitral valve:  There was mild regurgitation. - Left atrium: The atrium was severely dilated. - Right ventricle: The cavity size was normal. Wall thickness was normal. Systolic function was mildly reduced. - Right atrium: The atrium was mildly dilated. - Atrial septum: The septum bowed from left to right, consistent with increased left atrial pressure. No defect or patent foramen ovale was identified. - Tricuspid valve: There was moderate regurgitation. - Pulmonary arteries: PA peak pressure: 33 mm Hg (S). - Global longitudinal strain -7%.  Assessment & Plan    1.  Acute on chronic combined systolic and diastolic chf/ICM:  Ef [id number].  Admitted 2/8 with acute dyspnea and volume overload in the setting of medication noncompliance and marked HTN.  Net negative 960 ml since admission.  Wt up a lb since admission and up 13 lbs since admission on 1/24.  Of note, at that time, wt was down over 20 lbs in setting of prolonged n/v/anorexia.  Still with bibasilar crackles on exam but otw volume looks pretty good.  Neck veins relatively flat.  Resume coreg today.  Cont acei.  Switch to PO lasix after this morning's dose of IV.  We discussed the importance of daily weights, sodium restriction, medication compliance, and symptom reporting and he verbalizes understanding.   2.  Hypertensive Urgency:  BP overall improved but still generally trending in 150's to 160's.  Prev on coreg 25 bid (initially on hold in setting of acute decompensation) and lisinopril 10.  Coreg resumed 2/8 evening.  3.  CAD/elevated troponin:  Chronically elevated troponin (.10 on 1/15, .08 x3 on 1/25).  Current trend 0.09  0.12  0.14.  No chest pain.  Would not pursue ischemic eval @ this time.  Cont asa/acei/statin.  Coreg resumed.  4.  Noncompliance:  Left AMA 1/26 after admission for n/v.  Volume was stable on that admission.  Meds were adjusted to make more affordable as at that point, he was only taking meds every 2-3 days due to cost  (entresto biggest offender  d/c'd).  He verbalizes understanding for need for compliance.  5.  Tob Abuse:  Complete cessation advised.  7.  VT/NSVT:  Pt had prolonged run of asymptomatic VT last night. BB resumed last night.  He has an ICD.  8.  Hypokalemia:  supp.  Signed, Murray Hodgkins NP As above, patient seen and examined. His dyspnea is much improved. He denies chest pain. Minimal troponin elevation not consistent with acute coronary syndrome. No further ischemia evaluation. CHF is improving. Change Lasix to 40 mg twice a day. Continue ACE inhibitor. Resume carvedilol. His major issue is noncompliance  And we have stressed the importance of complying with medical therapy. Patient is noted to have nonsustained ventricular tachycardia on telemetry. Resume beta blocker. ICD in place. Kirk Ruths

## 2015-05-22 NOTE — Progress Notes (Signed)
Pt resting comfortably at this time.  Pt in no noted distress, BIPAP not required at this time.  RT to monitor and assess as needed.

## 2015-05-23 ENCOUNTER — Telehealth: Payer: Self-pay | Admitting: Cardiology

## 2015-05-23 DIAGNOSIS — I255 Ischemic cardiomyopathy: Secondary | ICD-10-CM

## 2015-05-23 DIAGNOSIS — I5041 Acute combined systolic (congestive) and diastolic (congestive) heart failure: Secondary | ICD-10-CM

## 2015-05-23 DIAGNOSIS — I160 Hypertensive urgency: Secondary | ICD-10-CM

## 2015-05-23 DIAGNOSIS — Z720 Tobacco use: Secondary | ICD-10-CM

## 2015-05-23 LAB — BASIC METABOLIC PANEL
Anion gap: 9 (ref 5–15)
BUN: 18 mg/dL (ref 6–20)
CHLORIDE: 107 mmol/L (ref 101–111)
CO2: 26 mmol/L (ref 22–32)
Calcium: 9 mg/dL (ref 8.9–10.3)
Creatinine, Ser: 1.06 mg/dL (ref 0.61–1.24)
GFR calc non Af Amer: >60 mL/min (ref >60)
Glucose, Bld: 97 mg/dL (ref 65–99)
POTASSIUM: 3.6 mmol/L (ref 3.5–5.1)
SODIUM: 142 mmol/L (ref 135–145)

## 2015-05-23 LAB — CBC
HEMATOCRIT: 45 % (ref 39.0–52.0)
HEMOGLOBIN: 14.6 g/dL (ref 13.0–17.0)
MCH: 30 pg (ref 26.0–34.0)
MCHC: 32.4 g/dL (ref 30.0–36.0)
MCV: 92.6 fL (ref 78.0–100.0)
Platelets: 234 10*3/uL (ref 150–400)
RBC: 4.86 MIL/uL (ref 4.22–5.81)
RDW: 14.2 % (ref 11.5–15.5)
WBC: 6.5 10*3/uL (ref 4.0–10.5)

## 2015-05-23 LAB — MAGNESIUM: Magnesium: 2 mg/dL (ref 1.7–2.4)

## 2015-05-23 MED ORDER — POTASSIUM CHLORIDE CRYS ER 20 MEQ PO TBCR
40.0000 meq | EXTENDED_RELEASE_TABLET | Freq: Every day | ORAL | Status: DC
  Administered 2015-05-23: 40 meq via ORAL
  Filled 2015-05-23: qty 2

## 2015-05-23 MED ORDER — LISINOPRIL 20 MG PO TABS: 10.0000 mg | ORAL_TABLET | Freq: Every day | ORAL | Status: DC

## 2015-05-23 MED ORDER — CARVEDILOL 25 MG PO TABS: 25.0000 mg | ORAL_TABLET | Freq: Two times a day (BID) | ORAL | Status: DC

## 2015-05-23 MED ORDER — FUROSEMIDE 40 MG PO TABS: 40.0000 mg | ORAL_TABLET | Freq: Every day | ORAL | Status: DC

## 2015-05-23 MED ORDER — OXYCODONE-ACETAMINOPHEN 5-325 MG PO TABS: 1.0000 | ORAL_TABLET | Freq: Four times a day (QID) | ORAL | Status: DC | PRN

## 2015-05-23 MED ORDER — LISINOPRIL 10 MG PO TABS
20.0000 mg | ORAL_TABLET | Freq: Every day | ORAL | Status: DC
  Administered 2015-05-23: 20 mg via ORAL
  Filled 2015-05-23: qty 2

## 2015-05-23 NOTE — Progress Notes (Signed)
   05/23/15 0238 05/23/15 0300  Vitals  BP (!) 189/77 mmHg --   MAP (mmHg) 109 --   Pulse Rate --  (!) 52  ECG Heart Rate --  62  Resp --  13  Oxygen Therapy  SpO2 --  97 %  hydralazine 10 mg iv prn

## 2015-05-23 NOTE — Discharge Summary (Signed)
Physician Discharge Summary  Roger Martinez [id number] DOB: 23-Oct-1965 DOA: 05/21/2015  PCP: Candee Furbish, MD  Admit date: 05/21/2015 Discharge date: 05/23/2015  Recommendations for Outpatient Follow-up:  1. Pt will need to follow up with PCP in 1-2 weeks post discharge 2. Please obtain BMP to evaluate electrolytes and kidney function 3. Please also check CBC to evaluate Hg and Hct levels 4. Please note changes made by cardiologist team, pt now on Lasix, Lisinopril, Coreg 5. Medical compliance discussed by multiple physicians  Discharge Diagnoses:  Principal Problem:   Acute on chronic combined systolic and diastolic congestive heart failure, NYHA class 3 (HCC) Active Problems:   H/O medication noncompliance   CAD, multiple vessel s/p CABG 2011    Ischemic cardiomyopathy   Tobacco abuse disorder   Hypertensive urgency   Acute congestive heart failure [facility])   Discharge Condition: Stable  Diet recommendation: Heart healthy diet discussed in details    Brief narrative:    Patient is 50 year old male with known history of coronary artery disease status post CABG, ischemic cardiomyopathy, hypertension, medical noncompliance, last echocardiogram January 2017 with ejection fraction 25-30% and grade 2 diastolic CHF, mild to moderate aortic insufficiency and mitral regurgitation, recently admitted for evaluation of nausea and vomiting that was thought to be secondary to pneumonia but he left [place] and has not taken the medications as prescribed. He now presents to [facility] long emergency department with main concern of several days duration of progressively worsening dyspnea that initially started with exertion and has progressed to dyspnea at rest in the past 24 hours. This has been associated with worsening orthopnea. Patient denies significant weight gain or weight loss, no lower extremity swelling as far as he is aware.  In ED, pt noted to be in mild distress due to  dyspnea, cardiology consulted. [facility] asked to admit for further evaluation. Please note initial blood pressure was 216/134 and repeated blood pressure 142/84. Stepdown unit bed requested.  Assessment/Plan:    Acute on chronic systolic and diastolic congestive heart failure - In patient with known medical noncompliance - Cardiology consulted, recommended Lasix 40 mg PO QD, lisinopril, coreg upon discharge - no chest pain, pt wants to go home and cardiology team cleared for d/c  Ischemic cardiomyopathy - Resumed lisinopril and coreg   Hypertensive urgency - Resumed Coreg and ACE inhibitor, diuresis with Lasix as noted above  Tobacco abuse  - Consult on cessation  Acute kidney injury - stable in the past 24 hours   Code Status: Full.  Family Communication: plan of care discussed with the patient Disposition Plan: Home   IV access:  Peripheral IV  Procedures and diagnostic studies:   Dg Chest 2 View 05/21/2015 Cardiomegaly with bilateral interstitial thickening and patchy right perihilar alveolar airspace opacities. Differential considerations include mild pulmonary vascular congestion with possible superimposed right perihilar pneumonia.   Dg Esophagus 05/08/2015 Limited study, as above, considered nondiagnostic. The patient should not be charged a professional fee for the interpretation of this examination.  Medical Consultants:  Cardiology   Other Consultants:  None  IAnti-Infectives:   None  Faye Ramsay, MD [facility] Pager [phone]      Discharge Exam: Filed Vitals:   05/23/15 0828 05/23/15 1000  BP:  132/77  Pulse:  66  Temp: 98.1 F (36.7 C)   Resp:  23   Filed Vitals:   05/23/15 0730 05/23/15 0828 05/23/15 0910 05/23/15 1000  BP: 171/84   132/77  Pulse: 79   66  Temp:  98.1 F (36.7 C)    TempSrc:  Oral    Resp: 15   23  Height:      Weight:   84.1 kg (185 lb 6.5 oz)   SpO2: 99%   98%    General: Pt is alert,  follows commands appropriately, not in acute distress Cardiovascular: Regular rate and rhythm, no rubs, no gallops Respiratory: Clear to auscultation bilaterally, no wheezing, no crackles, no rhonchi Abdominal: Soft, non tender, non distended, bowel sounds +, no guarding Extremities:  no cyanosis, pulses palpable bilaterally DP and PT Neuro: Grossly nonfocal  Discharge Instructions  Discharge Instructions    Diet - low sodium heart healthy    Complete by:  As directed      Increase activity slowly    Complete by:  As directed             Medication List    STOP taking these medications        doxycycline 100 MG capsule  Commonly known as:  VIBRAMYCIN     NITROSTAT 0.4 MG SL tablet  Generic drug:  nitroGLYCERIN      TAKE these medications        aspirin EC 81 MG tablet  Take 81 mg by mouth every 3 (three) days.     carvedilol 25 MG tablet  Commonly known as:  COREG  Take 1 tablet (25 mg total) by mouth 2 (two) times daily with a meal.     furosemide 40 MG tablet  Commonly known as:  LASIX  Take 1 tablet (40 mg total) by mouth daily.     lisinopril 20 MG tablet  Commonly known as:  PRINIVIL,ZESTRIL  Take 0.5 tablets (10 mg total) by mouth daily.     oxyCODONE-acetaminophen 5-325 MG tablet  Commonly known as:  PERCOCET/ROXICET  Take 1 tablet by mouth every 6 (six) hours as needed for moderate pain.     pantoprazole 40 MG tablet  Commonly known as:  PROTONIX  Take 1 tablet (40 mg total) by mouth daily.     potassium chloride SA 20 MEQ tablet  Commonly known as:  K-DUR,KLOR-CON  Take 20 mEq by mouth every 3 (three) days.     simvastatin 20 MG tablet  Commonly known as:  ZOCOR  Take 20 mg by mouth every 3 (three) days.            Follow-up Information    Follow up with Candee Furbish, MD.   Specialty:  Cardiology   Contact information:   [id number] [place] [address] [phone]       Call Faye Ramsay, MD.    Specialty:  Internal Medicine   Why:  As needed call my cell phone [phone]   Contact information:   [address] [place] [address] [phone]        The results of significant diagnostics from this hospitalization (including imaging, microbiology, ancillary and laboratory) are listed below for reference.     Microbiology: Recent Results (from the past 240 hour(s))  MRSA PCR Screening     Status: None   Collection Time: 05/21/15  9:30 PM  Result Value Ref Range Status   MRSA by PCR NEGATIVE NEGATIVE Final    Comment:        The GeneXpert MRSA Assay (FDA approved for NASAL specimens only), is one component of a comprehensive MRSA colonization surveillance program. It is not intended to diagnose MRSA infection nor to  guide or monitor treatment for MRSA infections.      Labs: Basic Metabolic Panel:  Recent Labs Lab 05/21/15 1457 05/22/15 0153 05/23/15 0318  NA 142 143 142  K 4.0 3.5 3.6  CL 106 105 107  CO2 24 25 26   GLUCOSE 97 99 97  BUN 16 19 18   CREATININE 1.31* 1.24 1.06  CALCIUM 9.6 9.0 9.0  MG  --   --  2.0   CBC:  Recent Labs Lab 05/21/15 1457 05/22/15 0153 05/23/15 0318  WBC 6.8 6.8 6.5  NEUTROABS 4.4  --   --   HGB 15.2 12.9* 14.6  HCT 45.4 38.9* 45.0  MCV 93.2 92.0 92.6  PLT 263 226 234   Cardiac Enzymes:  Recent Labs Lab 05/21/15 1457 05/22/15 0153 05/22/15 0826 05/22/15 1442  TROPONINI 0.09* 0.12* 0.14* 0.14*   BNP: BNP (last 3 results)  Recent Labs  05/21/15 1457  BNP 3492.5*   SIGNED: Time coordinating discharge: 30 minutes  Faye Ramsay, MD  [facility] 05/23/2015, 11:21 AM Pager [phone]  If 7PM-7AM, please contact night-coverage [email or web address] Password [email or web address]

## 2015-05-23 NOTE — Progress Notes (Signed)
   05/23/15 0600  Vitals  BP (!) 179/90 mmHg  MAP (mmHg) 121  Pulse Rate 74  ECG Heart Rate 75  Resp (!) 25  Oxygen Therapy  SpO2 98 %  O2 Device Room Air   Hydralazine 10 mg IV PRN

## 2015-05-23 NOTE — Progress Notes (Signed)
Pt discharged with wife via private vehicle. Discharge instructions given and all questions answered. Appts confirmed and verified with patient.

## 2015-05-23 NOTE — Progress Notes (Signed)
Patient: Roger Martinez / Admit Date: 05/21/2015 / Date of Encounter: 05/23/2015, 8:52 AM   Subjective: No CP. SOB improved. C/o sciatica issues. Reports ++UOP on Lasix.   Objective: Telemetry: NSR; nocturnal SB HR upper 40s, occasional PVCs, brief runs of NSVT (longest was 12 beats on 2/9 prior to BB re-initiation) Physical Exam: Blood pressure 171/84, pulse 79, temperature 98.1 F (36.7 C), temperature source Oral, resp. rate 15, height 5\' 11"  (1.803 m), weight 187 lb 9.8 oz (85.1 kg), SpO2 99 %. General: Well developed, well nourished AAM, in no acute distress. Head: Normocephalic, atraumatic, sclera non-icteric, no xanthomas, nares are without discharge. Neck: Negative for carotid bruits. JVP not elevated. Lungs: Clear bilaterally to auscultation without wheezes, rales, or rhonchi. Breathing is unlabored. Heart: RRR S1 S2 without murmurs, rubs, or gallops.  Abdomen: Soft, non-tender, non-distended with normoactive bowel sounds. No rebound/guarding. Extremities: No clubbing or cyanosis. No edema. Distal pedal pulses are 2+ and equal bilaterally. Neuro: Alert and oriented X 3. Moves all extremities spontaneously. Psych:  Responds to questions appropriately with a normal affect.   Intake/Output Summary (Last 24 hours) at 05/23/15 0852 Last data filed at 05/22/15 2100  Gross per 24 hour  Intake    480 ml  Output    900 ml  Net   -420 ml    Inpatient Medications:  . aspirin EC  81 mg Oral Q3 days  . carvedilol  25 mg Oral BID WC  . enoxaparin (LOVENOX) injection  40 mg Subcutaneous Q24H  . furosemide  40 mg Oral BID  . hydrALAZINE  25 mg Oral TID  . lisinopril  10 mg Oral Daily  . pantoprazole  40 mg Oral Daily  . simvastatin  20 mg Oral Q3 days   Infusions:    Labs:  Recent Labs  05/22/15 0153 05/23/15 0318  NA 143 142  K 3.5 3.6  CL 105 107  CO2 25 26  GLUCOSE 99 97  BUN 19 18  CREATININE 1.24 1.06  CALCIUM 9.0 9.0   No results for input(s): AST, ALT, ALKPHOS,  BILITOT, PROT, ALBUMIN in the last 72 hours.  Recent Labs  05/21/15 1457 05/22/15 0153 05/23/15 0318  WBC 6.8 6.8 6.5  NEUTROABS 4.4  --   --   HGB 15.2 12.9* 14.6  HCT 45.4 38.9* 45.0  MCV 93.2 92.0 92.6  PLT 263 226 234    Recent Labs  05/21/15 1457 05/22/15 0153 05/22/15 0826 05/22/15 1442  TROPONINI 0.09* 0.12* 0.14* 0.14*   Invalid input(s): POCBNP No results for input(s): HGBA1C in the last 72 hours.   Radiology/Studies:  Dg Chest 2 View  05/21/2015  CLINICAL DATA:  Shortness of breath, productive cough for 2 weeks EXAM: CHEST  2 VIEW COMPARISON:  05/06/2015 FINDINGS: There is bilateral interstitial thickening and patchy right perihilar alveolar airspace opacities. There is no pleural effusion or pneumothorax. There is stable cardiomegaly. There is a single lead cardiac pacemaker. There is evidence of prior CABG. The osseous structures are unremarkable. IMPRESSION: 1. Cardiomegaly with bilateral interstitial thickening and patchy right perihilar alveolar airspace opacities. Differential considerations include mild pulmonary vascular congestion with possible superimposed right perihilar pneumonia. Electronically Signed   By: Kathreen Devoid   On: 05/21/2015 14:44   Dg Chest 2 View  05/06/2015  CLINICAL DATA:  Vomiting and weight loss of unknown duration. Initial encounter. EXAM: CHEST  2 VIEW COMPARISON:  PA and lateral chest 04/27/2015 and 02/05/2015. FINDINGS: The patient is status post CABG with an  AICD in place. The lungs are clear. Heart size is enlarged. No pneumothorax or pleural effusion. No focal bony abnormality. IMPRESSION: Cardiomegaly without acute disease. Electronically Signed   By: Inge Rise M.D.   On: 05/06/2015 23:34   Dg Chest 2 View  04/27/2015  CLINICAL DATA:  Productive cough EXAM: CHEST  2 VIEW COMPARISON:  02/05/2015 FINDINGS: Left subclavian AICD device is stable. Right hilar soft tissue fullness is stable. Lungs remain grossly clear. No  pneumothorax. No pleural effusion. IMPRESSION: Stable chronic changes.  No active cardiopulmonary disease. Electronically Signed   By: Marybelle Killings M.D.   On: 04/27/2015 13:56   Ct Abdomen Pelvis W Contrast  05/07/2015  CLINICAL DATA:  Weakness beginning 1.5 weeks ago. Heartburn, nausea, inability to eat or drink. Patient is lost over 20 pounds. EXAM: CT ABDOMEN AND PELVIS WITH CONTRAST TECHNIQUE: Multidetector CT imaging of the abdomen and pelvis was performed using the standard protocol following bolus administration of intravenous contrast. CONTRAST:  61mL OMNIPAQUE IOHEXOL 300 MG/ML SOLN, 158mL OMNIPAQUE IOHEXOL 300 MG/ML SOLN COMPARISON:  CT chest 01/09/2012 FINDINGS: Focal areas of patchy infiltration noted in both lung bases, suggesting multifocal pneumonia. Small lipoma demonstrated in the right chest wall muscles. Postoperative changes in the mediastinum. The liver, spleen, gallbladder, pancreas, adrenal glands, inferior vena cava, and retroperitoneal lymph nodes are unremarkable. Left renal cyst. No hydronephrosis in either kidney. Atherosclerotic calcification in the abdominal aorta and branch vessels without aneurysm. Prominent calcification in the celiac axis and superior mesenteric artery origins. Vessels appear patent but stenosis is not excluded. The stomach and small bowel are decompressed. Colon is mostly decompressed with scattered stool present. No free air or free fluid in the abdomen. Small umbilical hernia containing fat. Pelvis: Prostate gland is enlarged, measuring 5.2 cm transverse dimension. Bladder wall is not thickened. Scattered diverticula in the sigmoid colon without evidence of diverticulitis. No pelvic mass or lymphadenopathy. No free or loculated pelvic fluid collections. The appendix is normal. Degenerative changes in the lumbar spine. No destructive bone lesions. IMPRESSION: Patchy infiltrates in the lung bases suggesting pneumonia. No evidence of bowel obstruction or  inflammation. No acute process demonstrated in the abdomen or pelvis. Prostate gland is enlarged. Atherosclerotic calcification of the abdominal aorta and branch vessels including the superior mesenteric artery and celiac axis. Electronically Signed   By: Lucienne Capers M.D.   On: 05/07/2015 00:33   Dg Esophagus  05/08/2015  CLINICAL DATA:  50 year old male with history of severe heartburn for the past several days. Emesis. 30 pound weight loss in the past 2 weeks. Weakness. EXAM: ESOPHOGRAM/BARIUM SWALLOW TECHNIQUE: Combined double contrast and single contrast examination performed using effervescent crystals, thick barium liquid, and thin barium liquid. FLUOROSCOPY TIME:  Radiation Exposure Index (as provided by the fluoroscopic device): 3.0 mGy If the device does not provide the exposure index: Fluoroscopy Time:  5 seconds Number of Acquired Images:  2 COMPARISON:  No priors. FINDINGS: Examination was exceedingly limited, with only 2 double contrast images acquired of the proximal esophagus, which were grossly unremarkable. These images demonstrated postoperative changes of CABG, atherosclerotic calcifications in the thoracic aorta, and a pacemaker/defibrillator lead projecting over the heart. After ingestion of hydrated effervescent crystals, the patient expressed discomfort and difficulty maintaining the gas in his esophagus. Upon ingestion of the thick barium, the patient expressed extreme pain, during which he used several profanities. When asked to ingest more barium, the patient became agitated and refused, stating that it was too painful. I asked the patient  to please relax and calm down, and informed the patient that the ingestion of barium was necessary to perform the examination. He became extremely agitated, telling me that I could not "force" him to drink the barium, and described me as a "drill sergeant" throughout the remainder of our exchange. I told him that I was not trying to force him to  do anything, but that the procedure required repeated ingestion of barium, and that we would have to terminate the procedure if he was unwilling or unable to drink any more barium. Throughout the interaction, the patient became increasingly agitated and uncooperative, repeatedly expressing profanity in a very angry fashion to both myself and the staff. Despite repeated efforts to deescalate the situation, the patient would not calm down. Accordingly, the examination was terminated and the patient was returned to his hospital room with a security escort. IMPRESSION: 1. Limited study, as above, considered nondiagnostic. The patient should not be charged a professional fee for the interpretation of this examination. Electronically Signed   By: Vinnie Langton M.D.   On: 05/08/2015 13:45   Dg Abd 2 Views  05/06/2015  CLINICAL DATA:  Nausea and vomiting today.  Initial encounter. EXAM: ABDOMEN - 2 VIEW COMPARISON:  None. FINDINGS: The bowel gas pattern is normal. There is no evidence of free air. No radio-opaque calculi or other significant radiographic abnormality is seen. Scoliosis statement lead from an AICD are noted. IMPRESSION: Negative exam. Electronically Signed   By: Inge Rise M.D.   On: 05/06/2015 20:29     Assessment and Plan  13M with history of CAD s/p CABGx3 2011, chronic combinend CHF EF 25-30%/hypertensive heart disease/ICM s/p ICD 06/2014, HLD, tobacco abuse, noncompliance admitted with a/c combined CHF and accelerated HTN in the setting of not taking his medications 3 days prior to admission. UDS + benzos, THC.  1. Acute on chronic combined CHF - weight not available yet today, doubt I/O's accurate. Will ask nursing to weigh. Beta blocker and hydralazine were just started yesterday. Has also been receiving hydralazine IV PRN periodically per nursing notes (3x since last night). Will titrate lisinopril to 20mg  daily today. Continue Lasix at present dose. Add KCl as below. May be nearing  d/c soon but need to discuss med titration with MD given that he has been receiving IV hydralazine - will discontinue this order so that we focus more on oral med titration.   2. Accelerated HTN -  See above.  3. Noncompliance - pt apologetic this admission for missing meds - was only taking every 2-3 days due to cost Delene Loll was biggest offender -> d/c'd). Compliance reinforced. This is major issue and puts him at risk for readmission.  4. Tobacco abuse - cessation advised.  5. CAD - continue ASA, BB, statin. Minimally elevated troponin felt due to demand ischemia rather than ACS.  6. NSVT - beta blocker resumed just yesterday, follow. Start KCl 6meq daily. Check Mg.   Signed, Melina Copa PA-C Pager: [phone] As above, patient seen and examined. He is much improved. He denies dyspnea or chest pain. He has a history of noncompliance and I would like to consolidate medications. Discontinue hydralazine. Increase lisinopril to 20 mg daily. Continue carvedilol. Change Lasix to 40 mg daily. We again discussed the importance of compliance with medications and avoiding tobacco abuse. Patient can be discharged from a cardiac standpoint on present medications. FU PA for TOC appt 1 week and Dr [name] 6 weeks Kirk Ruths

## 2015-05-23 NOTE — Progress Notes (Signed)
F/u scheduled with Dr. [name] 05/30/15 at 8:30am. Melina Copa PA-C

## 2015-05-23 NOTE — Telephone Encounter (Signed)
New message     TCM appt on 2.10.2017 with Dr. Gillian Shields per Melina Copa

## 2015-05-23 NOTE — Discharge Instructions (Signed)
Heart Failure  Heart failure means your heart has trouble pumping blood. This makes it hard for your body to work well. Heart failure is usually a long-term (chronic) condition. You must take good care of yourself and follow your doctor's treatment plan.  HOME CARE   Take your heart medicine as told by your doctor.    Do not stop taking medicine unless your doctor tells you to.    Do not skip any dose of medicine.    Refill your medicines before they run out.    Take other medicines only as told by your doctor or pharmacist.   Stay active if told by your doctor. The elderly and people with severe heart failure should talk with a doctor about physical activity.   Eat heart-healthy foods. Choose foods that are without trans fat and are low in saturated fat, cholesterol, and salt (sodium). This includes fresh or frozen fruits and vegetables, fish, lean meats, fat-free or low-fat dairy foods, whole grains, and high-fiber foods. Lentils and dried peas and beans (legumes) are also good choices.   Limit salt if told by your doctor.   Cook in a healthy way. Roast, grill, broil, bake, poach, steam, or stir-fry foods.   Limit fluids as told by your doctor.   Weigh yourself every morning. Do this after you pee (urinate) and before you eat breakfast. Write down your weight to give to your doctor.   Take your blood pressure and write it down if your doctor tells you to.   Ask your doctor how to check your pulse. Check your pulse as told.   Lose weight if told by your doctor.   Stop smoking or chewing tobacco. Do not use gum or patches that help you quit without your doctor's approval.   Schedule and go to doctor visits as told.   Nonpregnant women should have no more than 1 drink a day. Men should have no more than 2 drinks a day. Talk to your doctor about drinking alcohol.   Stop illegal drug use.   Stay current with shots (immunizations).   Manage your health conditions as told by your doctor.   Learn to  manage your stress.   Rest when you are tired.   If it is really hot outside:    Avoid intense activities.    Use air conditioning or fans, or get in a cooler place.    Avoid caffeine and alcohol.    Wear loose-fitting, lightweight, and light-colored clothing.   If it is really cold outside:    Avoid intense activities.    Layer your clothing.    Wear mittens or gloves, a hat, and a scarf when going outside.    Avoid alcohol.   Learn about heart failure and get support as needed.   Get help to maintain or improve your quality of life and your ability to care for yourself as needed.  GET HELP IF:    You gain weight quickly.   You are more short of breath than usual.   You cannot do your normal activities.   You tire easily.   You cough more than normal, especially with activity.   You have any or more puffiness (swelling) in areas such as your hands, feet, ankles, or belly (abdomen).   You cannot sleep because it is hard to breathe.   You feel like your heart is beating fast (palpitations).   You get dizzy or light-headed when you stand up.  GET HELP   RIGHT AWAY IF:    You have trouble breathing.   There is a change in mental status, such as becoming less alert or not being able to focus.   You have chest pain or discomfort.   You faint.  MAKE SURE YOU:    Understand these instructions.   Will watch your condition.   Will get help right away if you are not doing well or get worse.     This information is not intended to replace advice given to you by your health care provider. Make sure you discuss any questions you have with your health care provider.     Document Released: 01/06/2008 Document Revised: 04/19/2014 Document Reviewed: 05/15/2012  Elsevier Interactive Patient Education 2016 [employer]

## 2015-05-26 NOTE — Telephone Encounter (Signed)
Patient contacted regarding discharge from [facility] on May 23, 2015.  Patient understands to follow up with provider Dr. [name] on May 30, 2015 at 8:30AM at [facility]. Patient understands discharge instructions? yes Patient understands medications and regiment? yes Patient understands to bring all medications to this visit? yes

## 2015-05-30 ENCOUNTER — Ambulatory Visit (INDEPENDENT_AMBULATORY_CARE_PROVIDER_SITE_OTHER): Payer: PRIVATE HEALTH INSURANCE | Admitting: Cardiology

## 2015-05-30 ENCOUNTER — Encounter: Payer: Self-pay | Admitting: Cardiology

## 2015-05-30 VITALS — BP 170/100 | HR 62 | Ht 71.0 in | Wt 194.8 lb

## 2015-05-30 DIAGNOSIS — I110 Hypertensive heart disease with heart failure: Secondary | ICD-10-CM | POA: Diagnosis not present

## 2015-05-30 DIAGNOSIS — Z720 Tobacco use: Secondary | ICD-10-CM | POA: Diagnosis not present

## 2015-05-30 DIAGNOSIS — I5043 Acute on chronic combined systolic (congestive) and diastolic (congestive) heart failure: Secondary | ICD-10-CM

## 2015-05-30 DIAGNOSIS — I2510 Atherosclerotic heart disease of native coronary artery without angina pectoris: Secondary | ICD-10-CM | POA: Diagnosis not present

## 2015-05-30 NOTE — Progress Notes (Signed)
[facility] [address] [place], [place]  [address] Phone: [phone] Fax:  [phone]  Date:  05/30/2015   ID:  Roger Martinez, DOB 1965/12/12, MRN [id number]  PCP:  Candee Furbish, MD    transition of care visit following hospitalization  History of Present Illness: Roger Martinez is a 50 y.o. male with prior history of three-vessel bypass in 2011, coronary artery disease, hypertension here for follow-up.ICD placed. Dr. Lovena Le patient.  Patient previously left AMA from hospital on 04/07/13 with new diagnosis of systolic heart failure with ejection fraction of 30-35%. His EF has been repeated and was 20/25 percent. Because of this, defibrillator was placed. He was quite thankful on today's visit. He seems to have turned the corner with his medical compliance. Excellent.  He was a Administrator. Bruise left of sternum. Basketball.  2 miles a day walking. Feels good. No shortness of breath, no chest pain.  11/27/14 - Not taking Entesto but every 2-3 days. Same with other heart failure medications. Anxiety medication does not agree with him. Held lasix for several days. Every 3 days he is taking coreg. Feels anxious if takes daily. He thinks he has been eating more. He feels well however, no shortness of breath  03/18/15 - on 02/05/15 he presented to the emergency room and left AMA, saw Dr. condition he went to be admitted for rule out MI dilated cardiopathy, LVH best approach would be for diagnostic cath. He had substernal chest pain, right-sided, noncompliant with medications.   Really wants help to stop smoking. pets why he came today he states.  Really why he is here. Wants patch. Tried one prior to Thanksgiving. [employer], just singed up. he is not having any shortness of breath, no chest pain currently, no syncope. No orthopnea.   05/30/15-here for transition of care visit posthospitalization and discharge on 05/23/15, 7 days ago with accelerated hypertension, acute systolic  heart failure, noncompliance. Hospital records reviewed. Lab work reviewed. Troponin mildly elevated consistent with demand ischemia, 0.14. Creatinine 1.06, hemoglobin 14.6. Original EKG on 05/21/15 demonstrated sinus tachycardia 125 bpm.  He is going to be establishing with a primary physician next Tuesday. His wife is helping him get this set up. We talked about fluid. He admits that he was drinking Gatorade before and after walking. This would not be a good idea for him given his cardiomyopathy. We discussed salt intake as well. This seemed to like new information to him. Telephone call made prior to this appointment and documented.   Wt Readings from Last 3 Encounters:  05/30/15 194 lb 12.8 oz (88.361 kg)  05/23/15 185 lb 6.5 oz (84.1 kg)  05/08/15 182 lb 9.6 oz (82.827 kg)     Past Medical History  Diagnosis Date  . Coronary artery disease     a. 2011 s/p CABG x 3.  . Hypertensive heart disease with heart failure ([place])   . Chronic combined systolic (congestive) and diastolic (congestive) heart failure ([place])     a. 03/2014 Echo: EF 20-25%, diff HK, Gr 2 DD, mild AI, sev dil LA, mod reduced RV fxn, PASP 53mmHg.  . Ischemic cardiomyopathy     a. 03/2014 Echo: EF 20-25%;  b. 06/2014 s/p BSX single lead AICD, ser # [id number].  Marland Kitchen Anxiety state, unspecified   . MI (myocardial infarction) ([place]) 03/2011  . STD (sexually transmitted disease)   . Hyperlipidemia   . Tobacco abuse   . Noncompliance   . Marijuana abuse  Past Surgical History  Procedure Laterality Date  . Bypass graft  2011    3 vessel  . Ep implantable device  06/17/2014    DR [name]  . Implantable cardioverter defibrillator implant N/A 06/17/2014    Procedure: IMPLANTABLE CARDIOVERTER DEFIBRILLATOR IMPLANT;  Surgeon: Deboraha Sprang, MD;  Location: [facility] CATH LAB;  Service: Cardiovascular;  Laterality: N/A;    Current Outpatient Prescriptions  Medication Sig Dispense Refill  . aspirin EC 81 MG tablet Take 81 mg by mouth every 3  (three) days.     . carvedilol (COREG) 25 MG tablet Take 1 tablet (25 mg total) by mouth 2 (two) times daily with a meal. 60 tablet 3  . furosemide (LASIX) 40 MG tablet Take 1 tablet (40 mg total) by mouth daily. 30 tablet 3  . lisinopril (PRINIVIL,ZESTRIL) 20 MG tablet Take 0.5 tablets (10 mg total) by mouth daily. 30 tablet 3  . oxyCODONE-acetaminophen (PERCOCET/ROXICET) 5-325 MG tablet Take 1 tablet by mouth every 6 (six) hours as needed for moderate pain. 30 tablet 0  . pantoprazole (PROTONIX) 40 MG tablet Take 1 tablet (40 mg total) by mouth daily. 30 tablet 0  . potassium chloride SA (K-DUR,KLOR-CON) 20 MEQ tablet Take 20 mEq by mouth every 3 (three) days.      No current facility-administered medications for this visit.    Allergies:   No Known Allergies  Social History:  The patient  reports that he has quit smoking. He has never used smokeless tobacco. He reports that he uses illicit drugs (Marijuana). 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 Hypertension Mother     deceased.  . Diabetes Mother   . Other Father     alive - pt unaware of medical hx.    ROS:  Please see the history of present illness.   Denies any syncope, bleeding, positive orthopnea, positive PND, no fevers, chills, rash   All other systems reviewed and negative.   PHYSICAL EXAM: VS:  BP 170/100 mmHg  Pulse 62  Ht 5\' 11"  (1.803 m)  Wt 194 lb 12.8 oz (88.361 kg)  BMI 27.18 kg/m2  SpO2 98% Gen.: Well nourished, well developed, in no acute distress HEENT: normal, [place]/AT, EOMI Ext: no edema Skin: warm and dry, mild bruising left of sternum. Radial artery harvest site noted left arm CV: Regular rate and rhythm, no S3, no murmurs, minimal JVD Lungs: Clear to auscultation bilaterally no rales GU: deferred Neuro: no focal abnormalities noted, AAO x 3  EKG:  03/20/14-sinus bradycardia rate 56, T-wave inversion in precordial/inferior leads-no  change from prior 05/08/13 :Sinus rhythm rate 80 with T-wave inversion through precordial leads as well as inferior leads, possible ischemia. No significant change from prior.  Lab work, hospital records extensively reviewed. 3/16-creatinine 1.02, troponin normal, BNP 4500, LDL 118, hemoglobin 15.3  Echocardiogram:   05/08/15- Left ventricle: The cavity size was normal. Wall thickness was increased in a pattern of severe LVH. Systolic function was moderately to severely reduced. The estimated ejection fraction was in the range of 20-25%. Diffuse hypokinesis. - Aortic valve: Mild, moderate regurgitation. - Mitral valve: Mild regurgitation. - Left atrium: The atrium was moderately dilated. - Right atrium: The atrium was mildly dilated. - Pulmonary arteries: PA peak pressure: 44mm Hg (S).  ASSESSMENT AND PLAN:  History of coronary artery disease status post bypass surgery 3 in 2011 with chronic combined systolic heart failure EF 25-30% with  hypertensive heart disease, ischemic cardiomyopathy status post ICD in March 2016, tobacco use, history of noncompliance, accelerated hypertension, THC use, with recent discharge from hospital on 05/23/15.  1. Cardiomyopathy/chronic systolic heart failure-EF 25%-30.  Admitted December 2014 as well as February 2017 with acute systolic heart failure. Previously noncompliance. Truck driver. Defibrillator in place. Teary-eyed at times in the past.. Low-salt diet. Fluid restriction. Thankfully, he feels better. NYHA 1-2. He was started on Entresto by Dr. [name]. Postop secondary to cost during the hospitalization. Thankfully he  told me that he is taking him every 3 days. Try to encourage compliance however this is been an ongoing issue.  2. Coronary artery disease-post bypass/CABG in [facility].  No active anginal symptoms, right-sided chest pain previously in the emergency room, troponin was negative. He refused admission. 3. Essential hypertension-  Continue to encourage medication use. Recent hospitalization demonstrated accelerated hypertension. Once he has been on medications, this improved. Lisinopril was titrated to 20 mg a day. Lasix. He did not take his medication she had this morning. 4. Nonsustained ventricular tachycardia noted during hospitalization. Beta blocker. ICD in place 5. Hyperlipidemia-was taking simvastatin 20 mg every other day. As he is having myalgias. He stopped taking it however. We can address this at future clinic visit, perhaps atorvastatin low-dose. 6. 2 week close follow-up, Scott  Signed, Candee Furbish, MD [facility]  05/30/2015 8:48 AM

## 2015-05-30 NOTE — Patient Instructions (Addendum)
Medication Instructions:  The current medical regimen is effective;  continue present plan and medications.  Follow-Up: Follow up in 2 weeks with Richardson Dopp, PA.  If you need a refill on your cardiac medications before your next appointment, please call your pharmacy.  Please try not to drink Gatorade and refrain from foods high in sodium.  Thank you for choosing [facility]!!     Low-Sodium Eating Plan Sodium raises blood pressure and causes water to be held in the body. Getting less sodium from food will help lower your blood pressure, reduce any swelling, and protect your heart, liver, and kidneys. We get sodium by adding salt (sodium chloride) to food. Most of our sodium comes from canned, boxed, and frozen foods. Restaurant foods, fast foods, and pizza are also very high in sodium. Even if you take medicine to lower your blood pressure or to reduce fluid in your body, getting less sodium from your food is important. WHAT IS MY PLAN? Most people should limit their sodium intake to 2,300 mg a day. Your health care provider recommends that you limit your sodium intake to __________ a day.  WHAT DO I NEED TO KNOW ABOUT THIS EATING PLAN? For the low-sodium eating plan, you will follow these general guidelines:  Choose foods with a % Daily Value for sodium of less than 5% (as listed on the food label).   Use salt-free seasonings or herbs instead of table salt or sea salt.   Check with your health care provider or pharmacist before using salt substitutes.   Eat fresh foods.  Eat more vegetables and fruits.  Limit canned vegetables. If you do use them, rinse them well to decrease the sodium.   Limit cheese to 1 oz (28 g) per day.   Eat lower-sodium products, often labeled as "lower sodium" or "no salt added."  Avoid foods that contain monosodium glutamate (MSG). MSG is sometimes added to [place] food and some canned foods.  Check food labels (Nutrition Facts  labels) on foods to learn how much sodium is in one serving.  Eat more home-cooked food and less restaurant, buffet, and fast food.  When eating at a restaurant, ask that your food be prepared with less salt, or no salt if possible.  HOW DO I READ FOOD LABELS FOR SODIUM INFORMATION? The Nutrition Facts label lists the amount of sodium in one serving of the food. If you eat more than one serving, you must multiply the listed amount of sodium by the number of servings. Food labels may also identify foods as:  Sodium free--Less than 5 mg in a serving.  Very low sodium--35 mg or less in a serving.  Low sodium--140 mg or less in a serving.  Light in sodium--50% less sodium in a serving. For example, if a food that usually has 300 mg of sodium is changed to become light in sodium, it will have 150 mg of sodium.  Reduced sodium--25% less sodium in a serving. For example, if a food that usually has 400 mg of sodium is changed to reduced sodium, it will have 300 mg of sodium. WHAT FOODS CAN I EAT? Grains Low-sodium cereals, including oats, puffed wheat and rice, and shredded wheat cereals. Low-sodium crackers. Unsalted rice and pasta. Lower-sodium bread.  Vegetables Frozen or fresh vegetables. Low-sodium or reduced-sodium canned vegetables. Low-sodium or reduced-sodium tomato sauce and paste. Low-sodium or reduced-sodium tomato and vegetable juices.  Fruits Fresh, frozen, and canned fruit. Fruit juice.  Meat and Other Protein  Products Low-sodium canned tuna and salmon. Fresh or frozen meat, poultry, seafood, and fish. Lamb. Unsalted nuts. Dried beans, peas, and lentils without added salt. Unsalted canned beans. Homemade soups without salt. Eggs.  Dairy Milk. Soy milk. Ricotta cheese. Low-sodium or reduced-sodium cheeses. Yogurt.  Condiments Fresh and dried herbs and spices. Salt-free seasonings. Onion and garlic powders. Low-sodium varieties of mustard and ketchup. Fresh or refrigerated  horseradish. Lemon juice.  Fats and Oils Reduced-sodium salad dressings. Unsalted butter.  Other Unsalted popcorn and pretzels.  The items listed above may not be a complete list of recommended foods or beverages. Contact your dietitian for more options. WHAT FOODS ARE NOT RECOMMENDED? Grains Instant hot cereals. Bread stuffing, pancake, and biscuit mixes. Croutons. Seasoned rice or pasta mixes. Noodle soup cups. Boxed or frozen macaroni and cheese. Self-rising flour. Regular salted crackers. Vegetables Regular canned vegetables. Regular canned tomato sauce and paste. Regular tomato and vegetable juices. Frozen vegetables in sauces. Salted [place] fries. Olives. Angie Fava. Relishes. Sauerkraut. Salsa. Meat and Other Protein Products Salted, canned, smoked, spiced, or pickled meats, seafood, or fish. Bacon, ham, sausage, hot dogs, corned beef, chipped beef, and packaged luncheon meats. Salt pork. Jerky. Pickled herring. Anchovies, regular canned tuna, and sardines. Salted nuts. Dairy Processed cheese and cheese spreads. Cheese curds. Blue cheese and cottage cheese. Buttermilk.  Condiments Onion and garlic salt, seasoned salt, table salt, and sea salt. Canned and packaged gravies. Worcestershire sauce. Tartar sauce. Barbecue sauce. Teriyaki sauce. Soy sauce, including reduced sodium. Steak sauce. Fish sauce. Oyster sauce. Cocktail sauce. Horseradish that you find on the shelf. Regular ketchup and mustard. Meat flavorings and tenderizers. Bouillon cubes. Hot sauce. Tabasco sauce. Marinades. Taco seasonings. Relishes. Fats and Oils Regular salad dressings. Salted butter. Margarine. Ghee. Bacon fat.  Other Potato and tortilla chips. Corn chips and puffs. Salted popcorn and pretzels. Canned or dried soups. Pizza. Frozen entrees and pot pies.  The items listed above may not be a complete list of foods and beverages to avoid. Contact your dietitian for more information.   This information is  not intended to replace advice given to you by your health care provider. Make sure you discuss any questions you have with your health care provider.   Document Released: 09/18/2001 Document Revised: 04/19/2014 Document Reviewed: 01/31/2013 Elsevier Interactive Patient Education [employer].

## 2015-06-12 NOTE — Progress Notes (Signed)
Cardiology Office Note:    Date:  06/13/2015   ID:  Roger Martinez, DOB 15-Aug-1965, MRN [id number]  PCP:  Candee Furbish, MD  Cardiologist:  Dr. Candee Furbish   Electrophysiologist:  Dr. Virl Axe   Chief Complaint  Patient presents with  . Hypertensive Heart Disease    Follow up    History of Present Illness:     Roger Martinez is a 50 y.o. male with a hx of CAD status post CABG, dilated ischemic cardiomyopathy, combined systolic and diastolic CHF, hypertensive heart disease, HL, tobacco abuse.  Patient was previously cared for in [place], [place]. He underwent angioplasty of the OM branch and subsequent non-STEMI. LHC demonstrated severe 3 vessel CAD and he underwent CABG (LIMA-LAD, SVG-OM 2, free radial-PDA). He has known cardiomyopathy with EF 35% at the time of his bypass.  He was previously a patient of Dr. [name].  He is now followed by Dr. [name] in [place]. [name]. Ejection fraction has remained less than 35%. He underwent ICD implantation in 3/16.  Admitted 2/8-2/10 with acute on chronic combined systolic and diastolic CHF. Patient is not taking his medications correctly. He had minimally elevated troponin levels felt to be related to demand ischemia  Last seen by Dr. [name] 05/30/15. Blood pressure is uncontrolled. He was not taking medications correctly. He was drinking significant amounts of Gatorade (increased salt load). Compliance was encouraged and early follow-up was arranged today.     Past Medical History  Diagnosis Date  . Coronary artery disease     a. 2011 s/p CABG x 3.  . Hypertensive heart disease with heart failure ([place])   . Chronic combined systolic (congestive) and diastolic (congestive) heart failure ([place])     a. 03/2014 Echo: EF 20-25%, diff HK, Gr 2 DD, mild AI, sev dil LA, mod reduced RV fxn, PASP 13mmHg.  . Ischemic cardiomyopathy     a. 03/2014 Echo: EF 20-25%;  b. 06/2014 s/p BSX single lead AICD, ser # [id number].  Marland Kitchen Anxiety state, unspecified   . MI (myocardial  infarction) ([place]) 03/2011  . STD (sexually transmitted disease)   . Hyperlipidemia   . Tobacco abuse   . Noncompliance   . Marijuana abuse     Past Surgical History  Procedure Laterality Date  . Bypass graft  2011    3 vessel  . Ep implantable device  06/17/2014    DR [name]  . Implantable cardioverter defibrillator implant N/A 06/17/2014    Procedure: IMPLANTABLE CARDIOVERTER DEFIBRILLATOR IMPLANT;  Surgeon: Deboraha Sprang, MD;  Location: [facility] CATH LAB;  Service: Cardiovascular;  Laterality: N/A;    Current Medications: Outpatient Prescriptions Prior to Visit  Medication Sig Dispense Refill  . aspirin EC 81 MG tablet Take 81 mg by mouth every 3 (three) days.     . carvedilol (COREG) 25 MG tablet Take 1 tablet (25 mg total) by mouth 2 (two) times daily with a meal. 60 tablet 3  . furosemide (LASIX) 40 MG tablet Take 1 tablet (40 mg total) by mouth daily. 30 tablet 3  . lisinopril (PRINIVIL,ZESTRIL) 20 MG tablet Take 0.5 tablets (10 mg total) by mouth daily. 30 tablet 3  . oxyCODONE-acetaminophen (PERCOCET/ROXICET) 5-325 MG tablet Take 1 tablet by mouth every 6 (six) hours as needed for moderate pain. 30 tablet 0  . pantoprazole (PROTONIX) 40 MG tablet Take 1 tablet (40 mg total) by mouth daily. 30 tablet 0  . potassium chloride SA (K-DUR,KLOR-CON) 20 MEQ tablet Take 20 mEq by mouth every  3 (three) days.      No facility-administered medications prior to visit.     Allergies:   Review of patient's allergies indicates no known allergies.   Social History   Social History  . Marital Status: Legally Separated    Spouse Name: N/A  . Number of Children: N/A  . Years of Education: N/A   Social History Main Topics  . Smoking status: Former Smoker -- 0.00 packs/day for 20 years  . Smokeless tobacco: Never Used  . Alcohol Use: No  . Drug Use: Yes    Special: Marijuana     Comment: none for 2 weeks  . Sexual Activity: Not on file   Other Topics Concern  . Not on file   Social  History Narrative   Lives in [place] with wife.  Does not routinely exercise.  Unemployed.     Family History:  The patient's family history includes Diabetes in his mother; Hypertension in his mother; Other in his father.   ROS:   Please see the history of present illness.    ROS All other systems reviewed and are negative.   Physical Exam:    VS:  There were no vitals taken for this visit.   GEN: Well nourished, well developed, in no acute distress HEENT: normal Neck: no JVD, no masses Cardiac: Normal S1/S2, RRR; no murmurs, rubs, or gallops, no edema;   carotid bruits,   Respiratory:  clear to auscultation bilaterally; no wheezing, rhonchi or rales GI: soft, nontender, nondistended, + BS MS: no deformity or atrophy Skin: warm and dry, no rash Neuro:  Bilateral strength equal, no focal deficits  Psych: Alert and oriented x 3, normal affect  Wt Readings from Last 3 Encounters:  05/30/15 194 lb 12.8 oz (88.361 kg)  05/23/15 185 lb 6.5 oz (84.1 kg)  05/08/15 182 lb 9.6 oz (82.827 kg)      Studies/Labs Reviewed:     EKG:  EKG is  ordered today.  The ekg ordered today demonstrates   Recent Labs: 05/07/2015: ALT 11* 05/21/2015: B Natriuretic Peptide 3492.5* 05/23/2015: BUN 18; Creatinine, Ser 1.06; Hemoglobin 14.6; Magnesium 2.0; Platelets 234; Potassium 3.6; Sodium 142   Recent Lipid Panel    Component Value Date/Time   CHOL 171 04/06/2013 0202   TRIG 34 04/06/2013 0202   HDL 46 04/06/2013 0202   CHOLHDL 3.7 04/06/2013 0202   VLDL 7 04/06/2013 0202   LDLCALC 118* 04/06/2013 0202    Additional studies/ records that were reviewed today include:    Echo 05/08/15 Severe concentric LVH greater at the septum, EF 25-30%, diffuse HK, grade 2 diastolic dysfunction, mild to moderate AI, mild MR, severe LAE, mildly reduced RVSF, mild RAE increased left atrial pressure, moderate TR, PASP 33 mmHg, GLS -7%   ASSESSMENT:     1. Coronary artery disease involving native  coronary artery of native heart without angina pectoris   2. Hypertensive heart disease with heart failure ([place])   3. Chronic combined systolic and diastolic CHF (congestive heart failure) ([place])   4. Ischemic cardiomyopathy   5. Dyslipidemia   6. Implantable defibrillator reprogramming/check   7. Tobacco abuse disorder     PLAN:     In order of problems listed above:  1. CAD -   2. HTN Heart Disease -   3. Chronic Combined Systolic and Diastolic CHF -   4. Ischemic CM -   5. HL - Intol of simvastatin. Discussed Prava vs Rosuva.    6.  S/p ICD - FU with EP as planned.  7. Tobacco abuse -     Medication Adjustments/Labs and Tests Ordered: Current medicines are reviewed at length with the patient today.  Concerns regarding medicines are outlined above.  Medication changes, Labs and Tests ordered today are outlined in the Patient Instructions noted below. There are no Patient Instructions on file for this visit. Signed, Richardson Dopp, PA-C  06/13/2015 8:26 AM    [facility] [address], [place], [place]  [address] Phone: [phone]; Fax: [phone]     This encounter was created in error - please disregard.

## 2015-06-13 ENCOUNTER — Encounter (HOSPITAL_COMMUNITY): Admission: EM | Discharge status: Against Medical Advice | Payer: Self-pay | Source: Home / Self Care

## 2015-06-13 ENCOUNTER — Inpatient Hospital Stay (HOSPITAL_COMMUNITY)
Admission: EM | Admit: 2015-06-13 | Discharge: 2015-06-14 | DRG: 357 | Discharge status: Against Medical Advice | Payer: PRIVATE HEALTH INSURANCE | Attending: Surgery | Admitting: Surgery

## 2015-06-13 ENCOUNTER — Observation Stay (HOSPITAL_COMMUNITY): Payer: PRIVATE HEALTH INSURANCE | Admitting: Certified Registered Nurse Anesthetist

## 2015-06-13 ENCOUNTER — Encounter (HOSPITAL_COMMUNITY): Payer: Self-pay | Admitting: *Deleted

## 2015-06-13 ENCOUNTER — Encounter: Payer: PRIVATE HEALTH INSURANCE | Admitting: Physician Assistant

## 2015-06-13 ENCOUNTER — Emergency Department (HOSPITAL_COMMUNITY): Payer: PRIVATE HEALTH INSURANCE

## 2015-06-13 ENCOUNTER — Encounter: Payer: Self-pay | Admitting: Physician Assistant

## 2015-06-13 DIAGNOSIS — Z833 Family history of diabetes mellitus: Secondary | ICD-10-CM

## 2015-06-13 DIAGNOSIS — Z9114 Patient's other noncompliance with medication regimen: Secondary | ICD-10-CM

## 2015-06-13 DIAGNOSIS — K610 Anal abscess: Secondary | ICD-10-CM | POA: Diagnosis present

## 2015-06-13 DIAGNOSIS — Z951 Presence of aortocoronary bypass graft: Secondary | ICD-10-CM

## 2015-06-13 DIAGNOSIS — K612 Anorectal abscess: Principal | ICD-10-CM | POA: Diagnosis present

## 2015-06-13 DIAGNOSIS — Z91148 Patient's other noncompliance with medication regimen for other reason: Secondary | ICD-10-CM

## 2015-06-13 DIAGNOSIS — I255 Ischemic cardiomyopathy: Secondary | ICD-10-CM | POA: Diagnosis present

## 2015-06-13 DIAGNOSIS — Z79899 Other long term (current) drug therapy: Secondary | ICD-10-CM

## 2015-06-13 DIAGNOSIS — L905 Scar conditions and fibrosis of skin: Secondary | ICD-10-CM | POA: Diagnosis present

## 2015-06-13 DIAGNOSIS — Z720 Tobacco use: Secondary | ICD-10-CM | POA: Diagnosis present

## 2015-06-13 DIAGNOSIS — Z9119 Patient's noncompliance with other medical treatment and regimen: Secondary | ICD-10-CM

## 2015-06-13 DIAGNOSIS — E785 Hyperlipidemia, unspecified: Secondary | ICD-10-CM | POA: Diagnosis present

## 2015-06-13 DIAGNOSIS — I110 Hypertensive heart disease with heart failure: Secondary | ICD-10-CM | POA: Diagnosis present

## 2015-06-13 DIAGNOSIS — I5042 Chronic combined systolic (congestive) and diastolic (congestive) heart failure: Secondary | ICD-10-CM | POA: Diagnosis present

## 2015-06-13 DIAGNOSIS — F411 Generalized anxiety disorder: Secondary | ICD-10-CM | POA: Diagnosis present

## 2015-06-13 DIAGNOSIS — I272 Other secondary pulmonary hypertension: Secondary | ICD-10-CM | POA: Diagnosis present

## 2015-06-13 DIAGNOSIS — Z87891 Personal history of nicotine dependence: Secondary | ICD-10-CM

## 2015-06-13 DIAGNOSIS — Z8249 Family history of ischemic heart disease and other diseases of the circulatory system: Secondary | ICD-10-CM

## 2015-06-13 DIAGNOSIS — I160 Hypertensive urgency: Secondary | ICD-10-CM

## 2015-06-13 DIAGNOSIS — Z95810 Presence of automatic (implantable) cardiac defibrillator: Secondary | ICD-10-CM

## 2015-06-13 DIAGNOSIS — Z7982 Long term (current) use of aspirin: Secondary | ICD-10-CM

## 2015-06-13 DIAGNOSIS — I420 Dilated cardiomyopathy: Secondary | ICD-10-CM | POA: Diagnosis present

## 2015-06-13 DIAGNOSIS — I2510 Atherosclerotic heart disease of native coronary artery without angina pectoris: Secondary | ICD-10-CM | POA: Diagnosis present

## 2015-06-13 DIAGNOSIS — I252 Old myocardial infarction: Secondary | ICD-10-CM

## 2015-06-13 DIAGNOSIS — K611 Rectal abscess: Secondary | ICD-10-CM | POA: Diagnosis present

## 2015-06-13 HISTORY — PX: INCISION AND DRAINAGE PERIRECTAL ABSCESS: SHX1804

## 2015-06-13 LAB — COMPREHENSIVE METABOLIC PANEL WITH GFR
ALT: 10 U/L — ABNORMAL LOW (ref 17–63)
AST: 25 U/L (ref 15–41)
Albumin: 4 g/dL (ref 3.5–5.0)
Alkaline Phosphatase: 98 U/L (ref 38–126)
Anion gap: 9 (ref 5–15)
BUN: 13 mg/dL (ref 6–20)
CO2: 25 mmol/L (ref 22–32)
Calcium: 9.5 mg/dL (ref 8.9–10.3)
Chloride: 107 mmol/L (ref 101–111)
Creatinine, Ser: 1.24 mg/dL (ref 0.61–1.24)
GFR calc Af Amer: >60 mL/min
GFR calc non Af Amer: >60 mL/min
Glucose, Bld: 156 mg/dL — ABNORMAL HIGH (ref 65–99)
Potassium: 3.6 mmol/L (ref 3.5–5.1)
Sodium: 141 mmol/L (ref 135–145)
Total Bilirubin: 0.6 mg/dL (ref 0.3–1.2)
Total Protein: 7.7 g/dL (ref 6.5–8.1)

## 2015-06-13 LAB — TYPE AND SCREEN
ABO/RH(D): O POS
Antibody Screen: NEGATIVE

## 2015-06-13 LAB — CBC
HEMATOCRIT: 44.9 % (ref 39.0–52.0)
Hemoglobin: 15 g/dL (ref 13.0–17.0)
MCH: 31.4 pg (ref 26.0–34.0)
MCHC: 33.4 g/dL (ref 30.0–36.0)
MCV: 93.9 fL (ref 78.0–100.0)
PLATELETS: 241 10*3/uL (ref 150–400)
RBC: 4.78 MIL/uL (ref 4.22–5.81)
RDW: 14.4 % (ref 11.5–15.5)
WBC: 8.9 10*3/uL (ref 4.0–10.5)

## 2015-06-13 LAB — I-STAT CHEM 8, ED
BUN: 12 mg/dL (ref 6–20)
CALCIUM ION: 1.25 mmol/L — AB (ref 1.12–1.23)
CHLORIDE: 105 mmol/L (ref 101–111)
Creatinine, Ser: 1.1 mg/dL (ref 0.61–1.24)
Glucose, Bld: 153 mg/dL — ABNORMAL HIGH (ref 65–99)
HCT: 49 % (ref 39.0–52.0)
Hemoglobin: 16.7 g/dL (ref 13.0–17.0)
POTASSIUM: 3.6 mmol/L (ref 3.5–5.1)
SODIUM: 144 mmol/L (ref 135–145)
TCO2: 27 mmol/L (ref 0–100)

## 2015-06-13 LAB — ABO/RH: ABO/RH(D): O POS

## 2015-06-13 SURGERY — INCISION AND DRAINAGE, ABSCESS, PERIRECTAL
Anesthesia: General | Site: Renal

## 2015-06-13 MED ORDER — DIPHENHYDRAMINE HCL 25 MG PO CAPS: 25.0000 mg | ORAL_CAPSULE | Freq: Four times a day (QID) | ORAL | Status: DC | PRN

## 2015-06-13 MED ORDER — DEXAMETHASONE SODIUM PHOSPHATE 10 MG/ML IJ SOLN
INTRAMUSCULAR | Status: DC | PRN
  Administered 2015-06-13: 10 mg via INTRAVENOUS

## 2015-06-13 MED ORDER — POTASSIUM CHLORIDE IN NACL 20-0.9 MEQ/L-% IV SOLN
INTRAVENOUS | Status: DC
  Administered 2015-06-13: 1000 mL via INTRAVENOUS
  Administered 2015-06-14: 12:00:00 via INTRAVENOUS
  Filled 2015-06-13 (×3): qty 1000

## 2015-06-13 MED ORDER — NICOTINE POLACRILEX 2 MG MT GUM
2.0000 mg | CHEWING_GUM | OROMUCOSAL | Status: DC | PRN
  Filled 2015-06-13: qty 1

## 2015-06-13 MED ORDER — FENTANYL CITRATE (PF) 100 MCG/2ML IJ SOLN
INTRAMUSCULAR | Status: DC | PRN
  Administered 2015-06-13 (×2): 100 ug via INTRAVENOUS
  Administered 2015-06-13: 50 ug via INTRAVENOUS

## 2015-06-13 MED ORDER — OXYCODONE HCL 5 MG PO TABS: 5.0000 mg | ORAL_TABLET | Freq: Once | ORAL | Status: DC | PRN

## 2015-06-13 MED ORDER — MENTHOL 3 MG MT LOZG
1.0000 | LOZENGE | OROMUCOSAL | Status: DC | PRN
  Filled 2015-06-13: qty 9

## 2015-06-13 MED ORDER — OXYCODONE HCL 5 MG PO TABS: 5.0000 mg | ORAL_TABLET | ORAL | Status: DC | PRN

## 2015-06-13 MED ORDER — ENALAPRILAT 1.25 MG/ML IV SOLN
1.2500 mg | Freq: Once | INTRAVENOUS | Status: AC
  Administered 2015-06-13: 1.25 mg via INTRAVENOUS
  Filled 2015-06-13: qty 1

## 2015-06-13 MED ORDER — CEFTRIAXONE SODIUM 2 G IJ SOLR
2.0000 g | INTRAMUSCULAR | Status: DC
  Administered 2015-06-13 – 2015-06-14 (×2): 2 g via INTRAVENOUS
  Filled 2015-06-13: qty 2

## 2015-06-13 MED ORDER — BUPIVACAINE LIPOSOME 1.3 % IJ SUSP
20.0000 mL | INTRAMUSCULAR | Status: DC
  Filled 2015-06-13 (×2): qty 20

## 2015-06-13 MED ORDER — OXYCODONE HCL 5 MG/5ML PO SOLN
5.0000 mg | Freq: Once | ORAL | Status: DC | PRN
  Filled 2015-06-13: qty 5

## 2015-06-13 MED ORDER — WITCH HAZEL-GLYCERIN EX PADS
1.0000 "application " | MEDICATED_PAD | CUTANEOUS | Status: DC | PRN
  Filled 2015-06-13: qty 100

## 2015-06-13 MED ORDER — LIP MEDEX EX OINT
1.0000 "application " | TOPICAL_OINTMENT | Freq: Two times a day (BID) | CUTANEOUS | Status: DC
  Administered 2015-06-13 – 2015-06-14 (×2): 1 via TOPICAL

## 2015-06-13 MED ORDER — ACETAMINOPHEN 325 MG PO TABS: 650.0000 mg | ORAL_TABLET | Freq: Four times a day (QID) | ORAL | Status: DC | PRN

## 2015-06-13 MED ORDER — ONDANSETRON HCL 4 MG/2ML IJ SOLN
INTRAMUSCULAR | Status: AC
  Filled 2015-06-13: qty 2

## 2015-06-13 MED ORDER — PHENYLEPHRINE HCL 10 MG/ML IJ SOLN
INTRAMUSCULAR | Status: AC
  Filled 2015-06-13: qty 1

## 2015-06-13 MED ORDER — ALUM & MAG HYDROXIDE-SIMETH 200-200-20 MG/5ML PO SUSP: 30.0000 mL | Freq: Four times a day (QID) | ORAL | Status: DC | PRN

## 2015-06-13 MED ORDER — LISINOPRIL 10 MG PO TABS
10.0000 mg | ORAL_TABLET | Freq: Two times a day (BID) | ORAL | Status: DC
  Administered 2015-06-13 – 2015-06-14 (×2): 10 mg via ORAL
  Filled 2015-06-13 (×3): qty 1

## 2015-06-13 MED ORDER — LIDOCAINE HCL (CARDIAC) 20 MG/ML IV SOLN
INTRAVENOUS | Status: AC
  Filled 2015-06-13: qty 5

## 2015-06-13 MED ORDER — PROMETHAZINE HCL 25 MG/ML IJ SOLN: 6.2500 mg | INTRAMUSCULAR | Status: DC | PRN

## 2015-06-13 MED ORDER — KETOROLAC TROMETHAMINE 30 MG/ML IJ SOLN
INTRAMUSCULAR | Status: DC | PRN
Start: 2015-06-13 — End: 2015-06-13
  Administered 2015-06-13: 30 mg via INTRAVENOUS

## 2015-06-13 MED ORDER — LACTATED RINGERS IV SOLN
INTRAVENOUS | Status: DC
  Administered 2015-06-13: 1000 mL via INTRAVENOUS

## 2015-06-13 MED ORDER — SODIUM CHLORIDE 0.9 % IV SOLN: 250.0000 mL | INTRAVENOUS | Status: DC | PRN

## 2015-06-13 MED ORDER — SIMETHICONE 80 MG PO CHEW
40.0000 mg | CHEWABLE_TABLET | Freq: Four times a day (QID) | ORAL | Status: DC | PRN
  Filled 2015-06-13: qty 1

## 2015-06-13 MED ORDER — KETOROLAC TROMETHAMINE 30 MG/ML IJ SOLN
INTRAMUSCULAR | Status: AC
  Filled 2015-06-13: qty 1

## 2015-06-13 MED ORDER — SODIUM CHLORIDE 0.9 % IJ SOLN
INTRAMUSCULAR | Status: AC
  Filled 2015-06-13: qty 10

## 2015-06-13 MED ORDER — ENOXAPARIN SODIUM 40 MG/0.4ML ~~LOC~~ SOLN
40.0000 mg | SUBCUTANEOUS | Status: DC
  Administered 2015-06-14: 40 mg via SUBCUTANEOUS
  Filled 2015-06-13 (×2): qty 0.4

## 2015-06-13 MED ORDER — FUROSEMIDE 40 MG PO TABS
40.0000 mg | ORAL_TABLET | Freq: Every day | ORAL | Status: DC
  Administered 2015-06-14: 40 mg via ORAL
  Filled 2015-06-13: qty 1

## 2015-06-13 MED ORDER — LACTATED RINGERS IV BOLUS (SEPSIS): 1000.0000 mL | Freq: Three times a day (TID) | INTRAVENOUS | Status: DC | PRN

## 2015-06-13 MED ORDER — IOHEXOL 300 MG/ML  SOLN
100.0000 mL | Freq: Once | INTRAMUSCULAR | Status: AC | PRN
  Administered 2015-06-13: 100 mL via INTRAVENOUS

## 2015-06-13 MED ORDER — METHOCARBAMOL 500 MG PO TABS: 500.0000 mg | ORAL_TABLET | Freq: Four times a day (QID) | ORAL | Status: DC | PRN

## 2015-06-13 MED ORDER — ACETAMINOPHEN 500 MG PO TABS
1000.0000 mg | ORAL_TABLET | Freq: Three times a day (TID) | ORAL | Status: DC
  Administered 2015-06-13 – 2015-06-14 (×3): 1000 mg via ORAL
  Filled 2015-06-13 (×4): qty 2

## 2015-06-13 MED ORDER — PROPOFOL 10 MG/ML IV BOLUS
INTRAVENOUS | Status: AC
  Filled 2015-06-13: qty 20

## 2015-06-13 MED ORDER — LABETALOL HCL 5 MG/ML IV SOLN
INTRAVENOUS | Status: DC | PRN
  Administered 2015-06-13 (×4): 5 mg via INTRAVENOUS

## 2015-06-13 MED ORDER — LISINOPRIL 10 MG PO TABS
10.0000 mg | ORAL_TABLET | Freq: Every day | ORAL | Status: DC
  Filled 2015-06-13: qty 1

## 2015-06-13 MED ORDER — DIPHENHYDRAMINE HCL 50 MG/ML IJ SOLN: 25.0000 mg | Freq: Four times a day (QID) | INTRAMUSCULAR | Status: DC | PRN

## 2015-06-13 MED ORDER — HYDRALAZINE HCL 20 MG/ML IJ SOLN
10.0000 mg | INTRAMUSCULAR | Status: DC | PRN
  Administered 2015-06-14: 10 mg via INTRAVENOUS
  Filled 2015-06-13: qty 1

## 2015-06-13 MED ORDER — MEPERIDINE HCL 50 MG/ML IJ SOLN: 6.2500 mg | INTRAMUSCULAR | Status: DC | PRN

## 2015-06-13 MED ORDER — HYDROMORPHONE HCL 1 MG/ML IJ SOLN
0.5000 mg | INTRAMUSCULAR | Status: DC | PRN
  Administered 2015-06-13 – 2015-06-14 (×3): 1 mg via INTRAVENOUS
  Filled 2015-06-13 (×3): qty 1

## 2015-06-13 MED ORDER — FENTANYL CITRATE (PF) 250 MCG/5ML IJ SOLN
INTRAMUSCULAR | Status: AC
  Filled 2015-06-13: qty 5

## 2015-06-13 MED ORDER — ACETAMINOPHEN 10 MG/ML IV SOLN
INTRAVENOUS | Status: AC
  Filled 2015-06-13: qty 100

## 2015-06-13 MED ORDER — ONDANSETRON HCL 4 MG/2ML IJ SOLN: 4.0000 mg | Freq: Four times a day (QID) | INTRAMUSCULAR | Status: DC | PRN

## 2015-06-13 MED ORDER — PANTOPRAZOLE SODIUM 40 MG PO TBEC
40.0000 mg | DELAYED_RELEASE_TABLET | Freq: Every day | ORAL | Status: DC
  Administered 2015-06-14: 40 mg via ORAL
  Filled 2015-06-13: qty 1

## 2015-06-13 MED ORDER — SACCHAROMYCES BOULARDII 250 MG PO CAPS
250.0000 mg | ORAL_CAPSULE | Freq: Two times a day (BID) | ORAL | Status: DC
  Administered 2015-06-13 – 2015-06-14 (×2): 250 mg via ORAL
  Filled 2015-06-13 (×3): qty 1

## 2015-06-13 MED ORDER — HYDROCORTISONE ACE-PRAMOXINE 2.5-1 % RE CREA
1.0000 | TOPICAL_CREAM | Freq: Four times a day (QID) | RECTAL | Status: DC | PRN
Start: 2015-06-13 — End: 2015-06-14
  Filled 2015-06-13: qty 30

## 2015-06-13 MED ORDER — SODIUM CHLORIDE 0.9 % IJ SOLN
INTRAMUSCULAR | Status: AC
  Filled 2015-06-13: qty 20

## 2015-06-13 MED ORDER — FENTANYL CITRATE (PF) 100 MCG/2ML IJ SOLN
100.0000 ug | INTRAMUSCULAR | Status: DC | PRN
  Administered 2015-06-13: 100 ug via INTRAVENOUS
  Filled 2015-06-13: qty 2

## 2015-06-13 MED ORDER — KETOROLAC TROMETHAMINE 30 MG/ML IJ SOLN: 30.0000 mg | Freq: Once | INTRAMUSCULAR | Status: DC

## 2015-06-13 MED ORDER — SODIUM CHLORIDE 0.9% FLUSH: 3.0000 mL | Freq: Two times a day (BID) | INTRAVENOUS | Status: DC

## 2015-06-13 MED ORDER — ONDANSETRON HCL 4 MG/2ML IJ SOLN
INTRAMUSCULAR | Status: DC | PRN
  Administered 2015-06-13 (×2): 2 mg via INTRAVENOUS

## 2015-06-13 MED ORDER — LORAZEPAM 2 MG/ML IJ SOLN: 0.5000 mg | Freq: Three times a day (TID) | INTRAMUSCULAR | Status: DC | PRN

## 2015-06-13 MED ORDER — DOCUSATE SODIUM 100 MG PO CAPS
100.0000 mg | ORAL_CAPSULE | Freq: Two times a day (BID) | ORAL | Status: DC
  Administered 2015-06-13 – 2015-06-14 (×2): 100 mg via ORAL
  Filled 2015-06-13 (×3): qty 1

## 2015-06-13 MED ORDER — HYDRALAZINE HCL 20 MG/ML IJ SOLN: 10.0000 mg | INTRAMUSCULAR | Status: DC | PRN

## 2015-06-13 MED ORDER — MAGIC MOUTHWASH
15.0000 mL | Freq: Four times a day (QID) | ORAL | Status: DC | PRN
  Filled 2015-06-13: qty 15

## 2015-06-13 MED ORDER — ONDANSETRON HCL 4 MG/2ML IJ SOLN
4.0000 mg | Freq: Once | INTRAMUSCULAR | Status: AC
  Administered 2015-06-13: 4 mg via INTRAVENOUS
  Filled 2015-06-13: qty 2

## 2015-06-13 MED ORDER — MORPHINE SULFATE (PF) 4 MG/ML IV SOLN
4.0000 mg | INTRAVENOUS | Status: DC | PRN
  Administered 2015-06-13: 4 mg via INTRAVENOUS
  Filled 2015-06-13: qty 1

## 2015-06-13 MED ORDER — SODIUM CHLORIDE 0.9 % IV SOLN
INTRAVENOUS | Status: DC | PRN
  Administered 2015-06-13: 40 mL

## 2015-06-13 MED ORDER — HYDROMORPHONE HCL 1 MG/ML IJ SOLN: 0.2500 mg | INTRAMUSCULAR | Status: DC | PRN

## 2015-06-13 MED ORDER — ZOLPIDEM TARTRATE 5 MG PO TABS
5.0000 mg | ORAL_TABLET | Freq: Every evening | ORAL | Status: DC | PRN
  Administered 2015-06-14: 5 mg via ORAL
  Filled 2015-06-13: qty 1

## 2015-06-13 MED ORDER — LACTATED RINGERS IV SOLN
INTRAVENOUS | Status: DC | PRN
  Administered 2015-06-13 (×2): via INTRAVENOUS

## 2015-06-13 MED ORDER — MORPHINE SULFATE (PF) 2 MG/ML IV SOLN: 1.0000 mg | INTRAVENOUS | Status: DC | PRN

## 2015-06-13 MED ORDER — ACETAMINOPHEN 650 MG RE SUPP: 650.0000 mg | Freq: Four times a day (QID) | RECTAL | Status: DC | PRN

## 2015-06-13 MED ORDER — CARVEDILOL 25 MG PO TABS
25.0000 mg | ORAL_TABLET | Freq: Two times a day (BID) | ORAL | Status: DC
  Administered 2015-06-14 (×2): 25 mg via ORAL
  Filled 2015-06-13 (×4): qty 1

## 2015-06-13 MED ORDER — CEFTRIAXONE SODIUM 2 G IJ SOLR
INTRAMUSCULAR | Status: AC
  Filled 2015-06-13: qty 2

## 2015-06-13 MED ORDER — BUPIVACAINE-EPINEPHRINE 0.25% -1:200000 IJ SOLN
INTRAMUSCULAR | Status: DC | PRN
  Administered 2015-06-13: 10 mL

## 2015-06-13 MED ORDER — METRONIDAZOLE IN NACL 5-0.79 MG/ML-% IV SOLN
INTRAVENOUS | Status: AC
  Filled 2015-06-13: qty 100

## 2015-06-13 MED ORDER — BUPIVACAINE-EPINEPHRINE 0.25% -1:200000 IJ SOLN
INTRAMUSCULAR | Status: AC
  Filled 2015-06-13: qty 1

## 2015-06-13 MED ORDER — HYDRALAZINE HCL 20 MG/ML IJ SOLN
INTRAMUSCULAR | Status: AC
  Filled 2015-06-13: qty 1

## 2015-06-13 MED ORDER — ACETAMINOPHEN 10 MG/ML IV SOLN
INTRAVENOUS | Status: DC | PRN
  Administered 2015-06-13: 1000 mg via INTRAVENOUS

## 2015-06-13 MED ORDER — METRONIDAZOLE IN NACL 5-0.79 MG/ML-% IV SOLN
500.0000 mg | Freq: Three times a day (TID) | INTRAVENOUS | Status: DC
  Administered 2015-06-13 – 2015-06-14 (×4): 500 mg via INTRAVENOUS
  Filled 2015-06-13 (×4): qty 100

## 2015-06-13 MED ORDER — SODIUM CHLORIDE 0.9% FLUSH: 3.0000 mL | INTRAVENOUS | Status: DC | PRN

## 2015-06-13 MED ORDER — POLYETHYLENE GLYCOL 3350 17 G PO PACK
17.0000 g | PACK | Freq: Every day | ORAL | Status: DC | PRN
  Filled 2015-06-13: qty 1

## 2015-06-13 MED ORDER — PHENOL 1.4 % MT LIQD
2.0000 | OROMUCOSAL | Status: DC | PRN
Start: 2015-06-13 — End: 2015-06-14
  Filled 2015-06-13: qty 177

## 2015-06-13 MED ORDER — FUROSEMIDE 40 MG PO TABS
40.0000 mg | ORAL_TABLET | Freq: Once | ORAL | Status: AC
  Administered 2015-06-13: 40 mg via ORAL
  Filled 2015-06-13 (×2): qty 1

## 2015-06-13 MED ORDER — HYDRALAZINE HCL 20 MG/ML IJ SOLN: 20.0000 mg | Freq: Once | INTRAMUSCULAR | Status: DC

## 2015-06-13 MED ORDER — OXYCODONE-ACETAMINOPHEN 5-325 MG PO TABS: 1.0000 | ORAL_TABLET | ORAL | Status: DC | PRN

## 2015-06-13 MED ORDER — SODIUM CHLORIDE 0.9 % IV SOLN
Freq: Once | INTRAVENOUS | Status: AC
  Administered 2015-06-13: 09:00:00 via INTRAVENOUS

## 2015-06-13 MED ORDER — METOPROLOL TARTRATE 1 MG/ML IV SOLN
5.0000 mg | Freq: Four times a day (QID) | INTRAVENOUS | Status: DC | PRN
  Administered 2015-06-14: 5 mg via INTRAVENOUS
  Filled 2015-06-13 (×2): qty 5

## 2015-06-13 MED ORDER — MIDAZOLAM HCL 5 MG/5ML IJ SOLN
INTRAMUSCULAR | Status: DC | PRN
  Administered 2015-06-13 (×2): 0.5 mg via INTRAVENOUS

## 2015-06-13 MED ORDER — PROPOFOL 10 MG/ML IV BOLUS
INTRAVENOUS | Status: DC | PRN
  Administered 2015-06-13: 50 mg via INTRAVENOUS
  Administered 2015-06-13: 200 mg via INTRAVENOUS

## 2015-06-13 MED ORDER — LIDOCAINE HCL (CARDIAC) 20 MG/ML IV SOLN
INTRAVENOUS | Status: DC | PRN
  Administered 2015-06-13: 75 mg via INTRAVENOUS

## 2015-06-13 MED ORDER — ACETAMINOPHEN 10 MG/ML IV SOLN: 1000.0000 mg | Freq: Once | INTRAVENOUS | Status: DC

## 2015-06-13 MED ORDER — METOPROLOL TARTRATE 12.5 MG HALF TABLET
12.5000 mg | ORAL_TABLET | Freq: Two times a day (BID) | ORAL | Status: DC | PRN
  Filled 2015-06-13: qty 1

## 2015-06-13 MED ORDER — MIDAZOLAM HCL 2 MG/2ML IJ SOLN
INTRAMUSCULAR | Status: AC
  Filled 2015-06-13: qty 2

## 2015-06-13 MED ORDER — POLYETHYLENE GLYCOL 3350 17 G PO PACK
17.0000 g | PACK | Freq: Two times a day (BID) | ORAL | Status: DC
  Administered 2015-06-13 – 2015-06-14 (×2): 17 g via ORAL
  Filled 2015-06-13 (×3): qty 1

## 2015-06-13 SURGICAL SUPPLY — 25 items
BLADE HEX COATED 2.75 (ELECTRODE) ×3 IMPLANT
BLADE SURG 15 STRL LF DISP TIS (BLADE) ×1 IMPLANT
BLADE SURG 15 STRL SS (BLADE) ×2
BRIEF STRETCH FOR OB PAD LRG (UNDERPADS AND DIAPERS) ×3 IMPLANT
COVER SURGICAL LIGHT HANDLE (MISCELLANEOUS) ×3 IMPLANT
DRAPE LAPAROTOMY T 102X78X121 (DRAPES) ×3 IMPLANT
DRSG PAD ABDOMINAL 8X10 ST (GAUZE/BANDAGES/DRESSINGS) ×3 IMPLANT
ELECT PENCIL ROCKER SW 15FT (MISCELLANEOUS) ×3 IMPLANT
ELECT REM PT RETURN 9FT ADLT (ELECTROSURGICAL) ×3
ELECTRODE REM PT RTRN 9FT ADLT (ELECTROSURGICAL) ×1 IMPLANT
GAUZE SPONGE 4X4 12PLY STRL (GAUZE/BANDAGES/DRESSINGS) ×3 IMPLANT
GAUZE SPONGE 4X4 16PLY XRAY LF (GAUZE/BANDAGES/DRESSINGS) ×3 IMPLANT
GLOVE ECLIPSE 8.0 STRL XLNG CF (GLOVE) ×3 IMPLANT
GLOVE INDICATOR 8.0 STRL GRN (GLOVE) ×3 IMPLANT
GOWN STRL REUS W/TWL XL LVL3 (GOWN DISPOSABLE) ×6 IMPLANT
KIT BASIN OR (CUSTOM PROCEDURE TRAY) ×3 IMPLANT
LUBRICANT JELLY K Y 4OZ (MISCELLANEOUS) ×3 IMPLANT
NEEDLE HYPO 22GX1.5 SAFETY (NEEDLE) ×3 IMPLANT
PACK BASIC VI WITH GOWN DISP (CUSTOM PROCEDURE TRAY) ×3 IMPLANT
SPONGE LAP 18X18 X RAY DECT (DISPOSABLE) ×3 IMPLANT
SYR 20CC LL (SYRINGE) ×3 IMPLANT
SYR BULB IRRIGATION 50ML (SYRINGE) ×3 IMPLANT
TOWEL OR 17X26 10 PK STRL BLUE (TOWEL DISPOSABLE) ×3 IMPLANT
TOWEL OR NON WOVEN STRL DISP B (DISPOSABLE) ×3 IMPLANT
YANKAUER SUCT BULB TIP 10FT TU (MISCELLANEOUS) ×3 IMPLANT

## 2015-06-13 NOTE — Op Note (Addendum)
06/13/2015  3:39 PM  PATIENT:  Roger Martinez  50 y.o. male  Patient Care Team: Dorothy Spark, MD as Consulting Physician (Cardiology) Jerline Pain, MD as Consulting Physician (Cardiology)  PRE-OPERATIVE DIAGNOSIS:  Perianal abscess, rectal bleeding  POST-OPERATIVE DIAGNOSIS:  Perianal abscess, rectal bleeding  PROCEDURE:   INCISION AND DRAINAGE PERIRECTAL ABSCESS INCISION AND DEBRIDEMENT PERIRECTAL FISTULA / SCAR   SURGEON:  Surgeon(s): Michael Boston, MD  ASSISTANT: RN   ANESTHESIA:   local and general  EBL:  Total I/O In: 900 [I.V.:900] Out: -   Delay start of Pharmacological VTE agent (>24hrs) due to surgical blood loss or risk of bleeding:  no  DRAINS: none   SPECIMEN:  Source of Specimen:  1.  LEFT POSTERIOR ABSCESS CAVITY  2.  RIGHT ANTERIOR INFLAMED SCAR (?FISTULA) 3.  LEFT POSTERIOR PERIRECTAL ABSCESS FOR C&S  DISPOSITION OF SPECIMEN:  PATHOLOGY  COUNTS:  YES  PLAN OF CARE: Admit for overnight observation  PATIENT DISPOSITION:  PACU - hemodynamically stable.  INDICATION:  Patient with severe perirectal pain. Unable to tolerate examination.  CT scan suspicious for abscess. Patient had an history of abscesses in the past.  Wonders if he is had Crohn's but cannot recall for certain.  The anatomy and physiology of skin abscesses was discussed. Pathophysiology of SQ abscess, possible progression to fasciitis & sepsis, etc discussed . I stressed good hygiene & wound care. Possible redebridement was discussed as well.   Possibility of recurrence was discussed. Risks, benefits, alternatives were discussed. I noted a good likelihood this will help address the problem. Risks of anesthesia and other risks discussed. Questions answered. The patient is does wish to proceed.   OR FINDINGS:   4cm deep abscess left posterior perirectal.  Extended above/external to the sphincters.  No definite fistula on anorectal side.   Wound measures 3cm by 2cm.  It is 5 cm deep.  3  x 2 centimeter inflamed scar right anterior perirectal region with some induration..  Excised with some debridement. 3 x 2 cm superficial wound.   DESCRIPTION:   Informed consent was confirmed. The patient received IV antibiotics. The patient underwent general anesthesia without any difficulty. The patient was positioned in high lithotomy. SCDs were active during the entire case. The area around the abscess was prepped and draped in a sterile fashion. A surgical timeout confirmed our plan.   I could palpate an obvious 4 x 3 cm left posterior perirectal mass with edema at the anal verge, just lateral to the sphincters. That of dimpling and scarring suspicious for prior fistulas or abscesses.  I made an incision over the most fluctuant area of the mass.   .  Purulence released.  Culture sent.   I excised skin to have a nice open wound.  I placed my finger into the abscess cavity to break up loculations.  The incision was extended to adequately expose the entire cavity.  Excised skin/dermis/SQ fat.   Anoscopy revealed no internal fistulas or abscesses.  No major proctitis or inflammation. Major hemorrhoids.  No fissure.  No rectal mass.  Prostate smooth.  We did copious irrigation. The fascia was viable.   I did sharp debridement of skin with a scalpel to allow a nice 3cm by 2cm open wound. We took extra care to ensure hemostasis.     Patient had inflamed right anterior scar with pustules. Excise a scar of skin & dermis & SQ fat until I got to some healthy deeper perirectal subcutaneous tissues.  This was about 2 cm distal to the anal verge. Encountered no deep fistula or opening.  The posterior wound was packed with 1/4" NuGauze gauze x 8cm.  Anterior wound left open.  Sterile dressings applied.  Patient is being extubated go to recovery room.   We plan to continue IV antibiotics and begin wound care training tomorrow.   I am about to discuss OR findings with family per the patient's request  Adin Hector, M.D., F.A.C.S. Gastrointestinal and Minimally Invasive Surgery [facility] [address] [place], [place] [address] [phone] Main / Paging

## 2015-06-13 NOTE — H&P (Signed)
[facility] Admission Note  Roger Martinez 04-01-1966  [id number].    Requesting MD: Dr. Tanna Furry Chief Complaint/Reason for Consult: Perianal abscess  HPI:  50 y/o AA male smoker with PMH CAD s/p CABG 2011, Hypertensive heart disease with heart failure, ischemic cardiomyopathy s/p AICD, HLD presented to [facility] with rectal pain and rectal bleeding.  States that he noticed some rectal discomofort and itching in his groin on Tuesday night, but Wednesday he had an episode of diarrhea with some blood. Then Thursday, he had hard stools with clotted blood along with severe pain.  No drainage of purulence or mucous.  Denies any penile discharge or genital swelling/erythema.  He is monogamous with his wife, he denies rectal trauma, denies having sex with men, denies concern for STD's or crohns/UC.  Describes left sharp anal pain 10/10 upon arrival to the ED and pain with bowel movements. States it is painful when he walks/sit.  No other precipitating/alleviating factors, no radiating pain.  Denies fever/chills, N/V, abdominal pain.  He's never had anything like this before but does admit to "boils" in his perineal area in the past.  No known hemorrhoids.  No h/o abdominal surgeries.  Admits to marijuana use.  CT revealed a small perianal abscess.  WBC normal.  We were asked to eval for surgical intervention.  ROS: All systems reviewed and otherwise negative except for as above  Family History  Problem Relation Age of Onset  . Hypertension Mother     deceased.  . Diabetes Mother   . Other Father     alive - pt unaware of medical hx.    Past Medical History  Diagnosis Date  . Coronary artery disease     a. 2011 s/p CABG x 3.  . Hypertensive heart disease with heart failure ([place])   . Chronic combined systolic (congestive) and diastolic (congestive) heart failure ([place])     a. 03/2014 Echo: EF 20-25%, diff HK, Gr 2 DD, mild AI, sev dil LA, mod reduced RV fxn, PASP 70mHg.  . Ischemic  cardiomyopathy     a. 03/2014 Echo: EF 20-25%;  b. 06/2014 s/p BSX single lead AICD, ser # [id number]  .Marland KitchenAnxiety state, unspecified   . [place]  . STD (sexually transmitted disease)   . Hyperlipidemia   . Tobacco abuse   . Noncompliance   . Marijuana abuse     Past Surgical History  Procedure Laterality Date  . Bypass graft  2011    3 vessel  . Ep implantable device  06/17/2014    DR KCaryl Comes . Implantable cardioverter defibrillator implant N/A 06/17/2014    Procedure: IMPLANTABLE CARDIOVERTER DEFIBRILLATOR IMPLANT;  Surgeon: SDeboraha Sprang MD;  [facility];  Service: Cardiovascular;  Laterality: N/A;    Social History:  reports that he has quit smoking. He has never used smokeless tobacco. He reports that he uses illicit drugs (Marijuana). He reports that he does not drink alcohol.  Allergies: No Known Allergies   (Not in a hospital admission)  Blood pressure 169/97, pulse 80, temperature 98.2 F (36.8 C), temperature source Oral, resp. rate 9, SpO2 92 %. Physical Exam: General: pleasant, WD/WN AA male who is laying in bed in NAD HEENT: head is normocephalic, atraumatic.  Sclera are noninjected.  PERRL.  Ears and nose without any masses or lesions.  Mouth is pink and moist Heart: regular, rate, and rhythm.  No obvious murmurs, gallops, or rubs noted.  Palpable pedal pulses  bilaterally Lungs: CTAB, no wheezes, rhonchi, or rales noted.  Respiratory effort nonlabored Abd: soft, NT/ND, +BS, no masses, hernias, or organomegaly MS: all 4 extremities are symmetrical with no cyanosis, clubbing, or edema. Skin: warm and dry, perianal area is difficult to examen due to significant pain/tenderness in this area, no over signs of abscess, no punctum, no purulent or bloody drainage, no perineal or buttock erythema/induration, exquisite tenderness.   Genitals:  Chronic scarring of perineum/scrotum from prior infections/abscesses Psych: A&Ox3 with an  appropriate affect.   Results for orders placed or performed during the hospital encounter of 06/13/15 (from the past 48 hour(s))  Comprehensive metabolic panel     Status: Abnormal   Collection Time: 06/13/15  8:45 AM  Result Value Ref Range   Sodium 141 135 - 145 mmol/L   Potassium 3.6 3.5 - 5.1 mmol/L   Chloride 107 101 - 111 mmol/L   CO2 25 22 - 32 mmol/L   Glucose, Bld 156 (H) 65 - 99 mg/dL   BUN 13 6 - 20 mg/dL   Creatinine, Ser 1.24 0.61 - 1.24 mg/dL   Calcium 9.5 8.9 - 10.3 mg/dL   Total Protein 7.7 6.5 - 8.1 g/dL   Albumin 4.0 3.5 - 5.0 g/dL   AST 25 15 - 41 U/L   ALT 10 (L) 17 - 63 U/L   Alkaline Phosphatase 98 38 - 126 U/L   Total Bilirubin 0.6 0.3 - 1.2 mg/dL   GFR calc non Af Amer >60 >60 mL/min   GFR calc Af Amer >60 >60 mL/min    Comment: (NOTE) The eGFR has been calculated using the CKD EPI equation. This calculation has not been validated in all clinical situations. eGFR's persistently <60 mL/min signify possible Chronic Kidney Disease.    Anion gap 9 5 - 15  CBC     Status: None   Collection Time: 06/13/15  8:45 AM  Result Value Ref Range   WBC 8.9 4.0 - 10.5 K/uL   RBC 4.78 4.22 - 5.81 MIL/uL   Hemoglobin 15.0 13.0 - 17.0 g/dL   HCT 44.9 39.0 - 52.0 %   MCV 93.9 78.0 - 100.0 fL   MCH 31.4 26.0 - 34.0 pg   MCHC 33.4 30.0 - 36.0 g/dL   RDW 14.4 11.5 - 15.5 %   Platelets 241 150 - 400 K/uL  Type and screen [place]     Status: None   Collection Time: 06/13/15  8:45 AM  Result Value Ref Range   ABO/RH(D) O POS    Antibody Screen NEG    Sample Expiration 06/16/2015   ABO/Rh     Status: None   Collection Time: 06/13/15  8:48 AM  Result Value Ref Range   ABO/RH(D) O POS   I-stat chem 8, ed     Status: Abnormal   Collection Time: 06/13/15  9:19 AM  Result Value Ref Range   Sodium 144 135 - 145 mmol/L   Potassium 3.6 3.5 - 5.1 mmol/L   Chloride 105 101 - 111 mmol/L   BUN 12 6 - 20 mg/dL   Creatinine, Ser 1.10 0.61 - 1.24 mg/dL    Glucose, Bld 153 (H) 65 - 99 mg/dL   Calcium, Ion 1.25 (H) 1.12 - 1.23 mmol/L   TCO2 27 0 - 100 mmol/L   Hemoglobin 16.7 13.0 - 17.0 g/dL   HCT 49.0 39.0 - 52.0 %   Ct Abdomen Pelvis W Contrast  06/13/2015  CLINICAL DATA:  Rectal  bleeding EXAM: CT ABDOMEN AND PELVIS WITH CONTRAST TECHNIQUE: Multidetector CT imaging of the abdomen and pelvis was performed using the standard protocol following bolus administration of intravenous contrast. CONTRAST:  133m OMNIPAQUE IOHEXOL 300 MG/ML  SOLN COMPARISON:  05/06/2015 FINDINGS: Lung bases are free of acute infiltrate or sizable effusion. The heart is enlarged. The liver, gallbladder, spleen, adrenal glands and pancreas are within normal limits. Kidneys are well visualized with a normal enhancement pattern bilaterally. No renal calculi or urinary tract obstructive changes are seen. A left renal cyst is noted simple in nature. Aortoiliac calcifications are noted without aneurysmal dilatation. The appendix is well visualized and within normal limits. Very mild diverticular change is noted without evidence of diverticulitis. The bladder is partially distended. Degenerative changes of the lumbar spine are seen. On image number 84 of series 2 in the perianal region there is a 2.0 cm peripherally enhancing area of fluid attenuation. This raises suspicion for possible perianal abscess. No air is identified in this region. Clinical correlation is recommended with the physical exam. IMPRESSION: Changes suggestive of a small perianal abscess. No other acute abnormality is noted. Electronically Signed   By: MInez CatalinaM.D.   On: 06/13/2015 10:17   Dg Chest Port 1 View  06/13/2015  CLINICAL DATA:  Rectal bleeding and perirectal abscess. EXAM: PORTABLE CHEST 1 VIEW COMPARISON:  05/21/2015 FINDINGS: Stable cardiac enlargement and radiographic appearance of ICD. Stable chronic pulmonary venous hypertension and probable chronic edema without overt acute airspace edema or  pleural effusion. IMPRESSION: Stable cardiac enlargement and appearance of indwelling ICD. Stable chronic pulmonary venous hypertension and chronic edema. Electronically Signed   By: GAletta EdouardM.D.   On: 06/13/2015 11:30      Assessment/Plan Perianal abscess per CT Rectal/anal pain Melana/Hematochezia -Anesthesia consult given card concerns -Plan for EUA, drainage of perianal abscess -NPO, IVF, pain control, HTN control -Dr. GJohney Maineto see and eval, discussed procedure and he would like to proceed  CAD s/p CABG 2011 Hypertensive heart disease with heart failure Ischemic cardiomyopathy s/p AICD HLD Tobacco/Marijuana abuse  [facility] 06/13/2015, 12:58 PM Pager: [phone](7am - 4:30pm M-F; 7am - 11:30am Sa/Su)

## 2015-06-13 NOTE — Progress Notes (Signed)
Dr Al Corpus informed BP 173/112.  No orders received

## 2015-06-13 NOTE — Transfer of Care (Signed)
Immediate Anesthesia Transfer of Care Note  Patient: Roger Martinez  Procedure(s) Performed: Procedure(s): IRRIGATION AND DEBRIDEMENT PERIRECTAL ABSCESS (N/A)  Patient Location: PACU  Anesthesia Type:General  Level of Consciousness: awake, alert , oriented and patient cooperative  Airway & Oxygen Therapy: Patient Spontanous Breathing and Patient connected to face mask oxygen  Post-op Assessment: Report given to RN, Post -op Vital signs reviewed and stable and Patient moving all extremities  Post vital signs: Reviewed and stable  Last Vitals:  Filed Vitals:   06/13/15 1315 06/13/15 1550  BP: 159/95 173/112  Pulse: 74   Temp:  36.4 C  Resp: 17     Complications: No apparent anesthesia complications

## 2015-06-13 NOTE — Anesthesia Preprocedure Evaluation (Signed)
Anesthesia Evaluation  Patient identified by MRN, date of birth, ID band Patient awake    Reviewed: Allergy & Precautions, NPO status , Patient's Chart, lab work & pertinent test results, reviewed documented beta blocker date and time   Airway Mallampati: I  TM Distance: >3 FB Neck ROM: Full    Dental  (+) Teeth Intact, Dental Advisory Given   Pulmonary former smoker,    breath sounds clear to auscultation       Cardiovascular hypertension, Pt. on medications and Pt. on home beta blockers + CAD, + Past MI and +CHF   Rhythm:Regular Rate:Normal     Neuro/Psych    GI/Hepatic   Endo/Other    Renal/GU      Musculoskeletal   Abdominal   Peds  Hematology   Anesthesia Other Findings   Reproductive/Obstetrics                             Anesthesia Physical Anesthesia Plan  ASA: III  Anesthesia Plan: General   Post-op Pain Management:    Induction: Intravenous  Airway Management Planned: LMA  Additional Equipment:   Intra-op Plan:   Post-operative Plan: Extubation in OR  Informed Consent: I have reviewed the patients History and Physical, chart, labs and discussed the procedure including the risks, benefits and alternatives for the proposed anesthesia with the patient or authorized representative who has indicated his/her understanding and acceptance.   Dental advisory given  Plan Discussed with: CRNA, Anesthesiologist and Surgeon  Anesthesia Plan Comments:         Anesthesia Quick Evaluation

## 2015-06-13 NOTE — ED Notes (Signed)
Pt states he has had rectal bleeding with clots 3 nights ago and continues, Dark red clots

## 2015-06-13 NOTE — Anesthesia Postprocedure Evaluation (Signed)
Anesthesia Post Note  Patient: Roger Martinez  Procedure(s) Performed: Procedure(s) (LRB): IRRIGATION AND DEBRIDEMENT PERIRECTAL ABSCESS (N/A)  Patient location during evaluation: PACU Anesthesia Type: General Level of consciousness: awake and alert Pain management: pain level controlled Vital Signs Assessment: post-procedure vital signs reviewed and stable Respiratory status: spontaneous breathing, nonlabored ventilation and respiratory function stable Cardiovascular status: blood pressure returned to baseline and stable Postop Assessment: no signs of nausea or vomiting Anesthetic complications: no Comments: It should be noted that the pt was very hypertensive pre op and states he uses his current meds in "sort of" an experimental fashion based on his pressures measured after exercising. He sometimes takes none at all.    Last Vitals:  Filed Vitals:   06/13/15 1615 06/13/15 1630  BP: 169/109 172/108  Pulse: 64 63  Temp:    Resp: 15 16    Last Pain:  Filed Vitals:   06/13/15 1632  PainSc: 9                  Oddie Kuhlmann A

## 2015-06-13 NOTE — Consult Note (Addendum)
CARDIOLOGY CONSULT NOTE   Patient ID: Mitesh Mattern MRN: [id number] DOB/AGE: 48-May-1967 48 y.o.  Admit date: 06/13/2015  Consulting Physician: Primary Physician   No primary care provider on file. Primary Cardiologist  Dr. [name] Dr. [name] Reason for Consultation   HTN and CHF  HPI: Rudolpho Congo is a 50 y.o. 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 Johney Maine. Post op he has had issues with HTN and CHF and cardiology was consulted.   Patient was previously cared for in [place], [place]. He underwent angioplasty of the OM branch and subsequent non-STEMI. LHC demonstrated severe 3 vessel CAD and he underwent CABG (LIMA-LAD, SVG-OM 2, free radial-PDA). He has known cardiomyopathy with EF 35% at the time of his bypass. He was previously a patient of Dr. [name]. He is now followed by Dr. [name] in Dr. [name]. Ejection fraction has remained less than 35%. He underwent ICD implantation in 3/16.  He was recently seen by Dr. [name] on 05/30/15 for office post hospital follow up after an admission for accelerated  hypertension, acute systolic heart failure in the setting of noncompliance with taking his medications. He has been on lasix 40mg  daily, lisinopril 10mg  bid and coreg 25mg  bid.   He came to the ED with pain in his buttocks.  This has been going on since Wed.  He is now status post drainage of a perirectal abscess.  He did not take his meds today and I reviewed the MAR.  I see that he got one dose of IV enalapril.  He now has no rectal pain.  He denies any chest pain.  He is not currently having SOB.  He says that he was doing relatively well prior to this and has had no PND or orthopnea.  He had a His BPs have been elevated since arrival in the ED,  likely secondary to non compliance. 204/104 on admission. CXR w/ chronic pulmonary venous hypertension and chronic edema.   Past Medical History  Diagnosis Date  . Coronary artery disease     a. 2011 s/p CABG x 3.  . Hypertensive heart disease with heart failure ([place])   . Chronic combined systolic (congestive) and diastolic (congestive) heart failure ([place])     a. 03/2014 Echo: EF 20-25%, diff HK, Gr 2 DD, mild AI, sev dil LA, mod reduced RV fxn, PASP 13mmHg.  . Ischemic cardiomyopathy     a. 03/2014 Echo: EF 20-25%;  b. 06/2014 s/p BSX single lead AICD, ser # [id number].  Marland Kitchen Anxiety state, unspecified   . MI (myocardial infarction) ([place]) 03/2011  . STD (sexually transmitted disease)   . Hyperlipidemia   . Tobacco abuse   . Noncompliance   . Marijuana abuse      Past Surgical History  Procedure Laterality Date  . Bypass graft  2011    3 vessel  . Ep implantable device  06/17/2014    DR [name]  . Implantable cardioverter defibrillator implant N/A 06/17/2014    Procedure: IMPLANTABLE CARDIOVERTER DEFIBRILLATOR IMPLANT;  Surgeon: Deboraha Sprang, MD;  Location: [facility] CATH LAB;  Service: Cardiovascular;  Laterality: N/A;    Not on File  I have reviewed the patient's current medications . [MAR Hold] bupivacaine liposome  20  mL Infiltration On Call to OR  . [MAR Hold] carvedilol  25 mg Oral BID WC  . [MAR Hold] cefTRIAXone (ROCEPHIN)  IV  2 g Intravenous Q24H  . [MAR Hold] docusate sodium  100 mg Oral BID  . [MAR Hold] furosemide  40 mg Oral Daily  . ketorolac      . [MAR Hold] lisinopril  10 mg Oral Daily  . [MAR Hold] metronidazole  500 mg Intravenous Q8H  . [MAR Hold] pantoprazole  40 mg Oral Daily   . 0.9 % NaCl with KCl 20 mEq / L    . lactated ringers 1,000 mL (06/13/15 1424)   [MAR Hold] acetaminophen **OR** [MAR Hold] acetaminophen, [MAR Hold] diphenhydrAMINE **OR** [MAR Hold] diphenhydrAMINE, [MAR Hold] fentaNYL (SUBLIMAZE) injection, [MAR Hold] hydrALAZINE, HYDROmorphone  (DILAUDID) injection, meperidine (DEMEROL) injection, [MAR Hold] methocarbamol, [MAR Hold]  morphine injection, [MAR Hold] ondansetron (ZOFRAN) IV, oxyCODONE **OR** oxyCODONE, [MAR Hold] oxyCODONE, [MAR Hold] polyethylene glycol, [MAR Hold] simethicone  Prior to Admission medications   Medication Sig Start Date End Date Taking? Authorizing Provider  aspirin EC 81 MG tablet Take 81 mg by mouth every 3 (three) days.    Yes Historical Provider, MD  carvedilol (COREG) 25 MG tablet Take 1 tablet (25 mg total) by mouth 2 (two) times daily with a meal. Patient taking differently: Take 25 mg by mouth every 3 (three) days.  05/23/15  Yes Theodis Blaze, MD  furosemide (LASIX) 40 MG tablet Take 1 tablet (40 mg total) by mouth daily. Patient taking differently: Take 40 mg by mouth every 3 (three) days.  05/23/15  Yes Theodis Blaze, MD  lisinopril (PRINIVIL,ZESTRIL) 20 MG tablet Take 0.5 tablets (10 mg total) by mouth daily. Patient taking differently: Take 10 mg by mouth every 3 (three) days.  05/23/15  Yes Theodis Blaze, MD  pantoprazole (PROTONIX) 40 MG tablet Take 1 tablet (40 mg total) by mouth daily. Patient taking differently: Take 40 mg by mouth every 3 (three) days.  05/08/15  Yes Theodis Blaze, MD  potassium chloride SA (K-DUR,KLOR-CON) 20 MEQ tablet Take 20 mEq by mouth every 3 (three) days.    Yes Historical Provider, MD  oxyCODONE-acetaminophen (PERCOCET/ROXICET) 5-325 MG tablet Take 1-2 tablets by mouth every 4 (four) hours as needed for moderate pain. 06/13/15   Michael Boston, MD     Social History   Social History  . Marital Status: Legally Separated    Spouse Name: N/A  . Number of Children: N/A  . Years of Education: N/A   Occupational History  . Not on file.   Social History Main Topics  . Smoking status: Former Smoker -- 0.00 packs/day for 20 years  . Smokeless tobacco: Never Used  . Alcohol Use: No  . Drug Use: Yes    Special: Marijuana     Comment: none for 2 weeks  . Sexual  Activity: Not on file   Other Topics Concern  . Not on file   Social History Narrative   Lives in [place] with wife.  Does not routinely exercise.  Unemployed.    Family Status  Relation Status Death Age  . Mother Deceased   . Father Deceased    Family History  Problem Relation Age of Onset  . Hypertension Mother     deceased.  . Diabetes Mother   . Other Father     alive - pt unaware of medical hx.     ROS:  Full 14 point review of  systems complete and found to be negative unless listed above.  Physical Exam: Blood pressure 159/109, pulse 63, temperature 97.6 F (36.4 C), temperature source Oral, resp. rate 14, SpO2 100 %.  GENERAL:  Well appearing HEENT:  Pupils equal round and reactive, fundi not visualized, oral mucosa unremarkable NECK:  No jugular venous distention, waveform within normal limits, carotid upstroke brisk and symmetric, no bruits, no thyromegaly LYMPHATICS:  No cervical, inguinal adenopathy LUNGS:  Clear to auscultation bilaterally BACK:  No CVA tenderness CHEST:  ICD and sternotomy scar well healed.  Marland Kitchen  HEART:  PMI not displaced or sustained,S1 and S2 within normal limits, no S3, no S4, no clicks, no rubs, 3/6 systolic murmur at the apex, no diastolic murmurs ABD:  Flat, positive bowel sounds normal in frequency in pitch, no bruits, no rebound, no guarding, no midline pulsatile mass, no hepatomegaly, no splenomegaly EXT:  2 plus pulses throughout, no edema, no cyanosis no clubbing SKIN:  No rashes no nodules NEURO:  Cranial nerves II through XII grossly intact, motor grossly intact throughout PSYCH:  Cognitively intact, oriented to person place and time  Labs:   Lab Results  Component Value Date   WBC 8.9 06/13/2015   HGB 16.7 06/13/2015   HCT 49.0 06/13/2015   MCV 93.9 06/13/2015   PLT 241 06/13/2015   No results for input(s): INR in the last 72 hours.  Recent Labs Lab 06/13/15 0845 06/13/15 0919  NA 141 144  K 3.6 3.6  CL 107 105    CO2 25  --   BUN 13 12  CREATININE 1.24 1.10  CALCIUM 9.5  --   PROT 7.7  --   BILITOT 0.6  --   ALKPHOS 98  --   ALT 10*  --   AST 25  --   GLUCOSE 156* 153*  ALBUMIN 4.0  --    MAGNESIUM  Date Value Ref Range Status  05/23/2015 2.0 1.7 - 2.4 mg/dL Final   No results for input(s): CKTOTAL, CKMB, TROPONINI in the last 72 hours. No results for input(s): TROPIPOC in the last 72 hours. PRO B NATRIURETIC PEPTIDE (BNP)  Date/Time Value Ref Range Status  05/25/2013 01:56 PM 1074.0* 0.0 - 100.0 pg/mL Final  04/05/2013 08:15 PM 4534.0* 0 - 125 pg/mL Final   Lab Results  Component Value Date   CHOL 171 04/06/2013   HDL 46 04/06/2013   LDLCALC 118* 04/06/2013   TRIG 34 04/06/2013   No results found for: DDIMER LIPASE  Date/Time Value Ref Range Status  05/06/2015 10:54 PM 17 11 - 51 U/L Final    Echo 05/08/2015 LV EF: 25% -  30% Study Conclusions - Left ventricle: The cavity size was mildly dilated. There was severe concentric hypertrophy with more hypetrophy of the septum. Systolic function was severely reduced. The estimated ejection fraction was in the range of 25% to 30%. Diffuse hypokinesis. Features are consistent with a pseudonormal left ventricular filling pattern, with concomitant abnormal relaxation and increased filling pressure (grade 2 diastolic dysfunction). Doppler parameters are consistent with high ventricular filling pressure. - Aortic valve: There was mild to moderate regurgitation. Regurgitation pressure half-time: 541 ms. - Mitral valve: There was mild regurgitation. - Left atrium: The atrium was severely dilated. - Right ventricle: The cavity size was normal. Wall thickness was normal. Systolic function was mildly reduced. - Right atrium: The atrium was mildly dilated. - Atrial septum: The septum bowed from left to right, consistent with increased left atrial pressure. No defect  or patent foramen ovale was identified. -  Tricuspid valve: There was moderate regurgitation. - Pulmonary arteries: PA peak pressure: 33 mm Hg (S). - Global longitudinal strain -7%.  ECG:  HR 73. Sinus rhythm Biatrial enlargement LVH with secondary repolarization abnormality Borderline prolonged QT interval No significant change from prior.  Radiology:  Ct Abdomen Pelvis W Contrast  06/13/2015  CLINICAL DATA:  Rectal bleeding EXAM: CT ABDOMEN AND PELVIS WITH CONTRAST TECHNIQUE: Multidetector CT imaging of the abdomen and pelvis was performed using the standard protocol following bolus administration of intravenous contrast. CONTRAST:  14mL OMNIPAQUE IOHEXOL 300 MG/ML  SOLN COMPARISON:  05/06/2015 FINDINGS: Lung bases are free of acute infiltrate or sizable effusion. The heart is enlarged. The liver, gallbladder, spleen, adrenal glands and pancreas are within normal limits. Kidneys are well visualized with a normal enhancement pattern bilaterally. No renal calculi or urinary tract obstructive changes are seen. A left renal cyst is noted simple in nature. Aortoiliac calcifications are noted without aneurysmal dilatation. The appendix is well visualized and within normal limits. Very mild diverticular change is noted without evidence of diverticulitis. The bladder is partially distended. Degenerative changes of the lumbar spine are seen. On image number 84 of series 2 in the perianal region there is a 2.0 cm peripherally enhancing area of fluid attenuation. This raises suspicion for possible perianal abscess. No air is identified in this region. Clinical correlation is recommended with the physical exam. IMPRESSION: Changes suggestive of a small perianal abscess. No other acute abnormality is noted. Electronically Signed   By: Inez Catalina M.D.   On: 06/13/2015 10:17   Dg Chest Port 1 View  06/13/2015  CLINICAL DATA:  Rectal bleeding and perirectal abscess. EXAM: PORTABLE CHEST 1 VIEW COMPARISON:  05/21/2015 FINDINGS: Stable cardiac enlargement and  radiographic appearance of ICD. Stable chronic pulmonary venous hypertension and probable chronic edema without overt acute airspace edema or pleural effusion. IMPRESSION: Stable cardiac enlargement and appearance of indwelling ICD. Stable chronic pulmonary venous hypertension and chronic edema. Electronically Signed   By: Aletta Edouard M.D.   On: 06/13/2015 11:30    ASSESSMENT AND PLAN:     HTN:  BP is elevated.  He did not have his antihypertensives this morning.  I am not sure he takes his meds as prescribed.  This has been an ongoing issue.  He is going to restart the previous meds.   He is on moderate dose ACE and this could be increased to Lisinopril 20 bid in the AM if BP is not better.  Also could be started on spironolactone.  I would like to see what is BP is after he gets his evening meds.  He can get PRN hydralazine tonight.    Chronic systolic heart failure/CM :  Mild basilar crackles.  I will give him PO Lasix tonight and then resume scheduled in the AM.    CAD:  No active ischemia.  No further work up.    TOBACCO ABUSE: Still smoking. He understands the need to reduce this.    Minus Breeding

## 2015-06-13 NOTE — Progress Notes (Signed)
Plan EUA in office My PA, Megan, d/w Dr [name] - OK to proceed w surgery & keep dry as possible.  They will see PRN  The anatomy and physiology of the region was discussed. The pathophysiology of subcutaneous abscess formation with progression to fasciitis & sepsis was discussed.  Need for incision, drainage, debridement discussed.  I stressed good hygiene & need for repeated wound care.  Possible redebridement / reoperation was discussed as well. Possibility of recurrence was discussed.   Risks of bleeding, infection, abscess, leak, injury to other organs, need for repair of tissues / organs, need for further treatment, heart attack, death, and other risks were discussed.  Benefits, alternatives were discussed. I noted a good likelihood this will help address the problem.  Questions answered.  The patient agrees to proceed.  Adin Hector, M.D., F.A.C.S. Gastrointestinal and Minimally Invasive Surgery [facility] [address] [place], [place] [address] [phone] Main / Paging

## 2015-06-13 NOTE — ED Provider Notes (Signed)
CSN: [id number]     Arrival date & time 06/13/15  [id number] History   First MD Initiated Contact with Patient 06/13/15 [phone]     Chief Complaint  Patient presents with  . Rectal Bleeding      HPI  Patient was initially evaluation of rectal pain and rectal bleeding. States that 2 days ago had an episode of diarrhea that had some blood. It was on Wednesday.  Then yesterday ,Thursday, he had formed bowel movements that had some clotted blood and severe pain with bowel it. No drainage of purulence. Has perirectal pain more on the left side.  It started last night, became severe this morning, thus he presents here. Describes the pain to the left of his anus and pain with bowel movements. States it is painful when he walks.  Past Medical History  Diagnosis Date  . Coronary artery disease     a. 2011 s/p CABG x 3.  . Hypertensive heart disease with heart failure ([place])   . Chronic combined systolic (congestive) and diastolic (congestive) heart failure ([place])     a. 03/2014 Echo: EF 20-25%, diff HK, Gr 2 DD, mild AI, sev dil LA, mod reduced RV fxn, PASP 35mmHg.  . Ischemic cardiomyopathy     a. 03/2014 Echo: EF 20-25%;  b. 06/2014 s/p BSX single lead AICD, ser # [id number].  Marland Kitchen Anxiety state, unspecified   . MI (myocardial infarction) ([place]) 03/2011  . STD (sexually transmitted disease)   . Hyperlipidemia   . Tobacco abuse   . Noncompliance   . Marijuana abuse    Past Surgical History  Procedure Laterality Date  . Bypass graft  2011    3 vessel  . Ep implantable device  06/17/2014    DR [name]  . Implantable cardioverter defibrillator implant N/A 06/17/2014    Procedure: IMPLANTABLE CARDIOVERTER DEFIBRILLATOR IMPLANT;  Surgeon: Deboraha Sprang, MD;  Location[facility] C[facility] LAB;  Service: Cardiovascular;  Laterality: N/A;  . Incision and drainage perirectal abscess N/A 06/13/2015    Procedure: IRRIGATION AND DEBRIDEMENT PERIRECTAL ABSCESS;  Surgeon: Michael Boston, MD;  Location: [facility] ORS;  Service: General;   Laterality: N/A;   Family History  Problem Relation Age of Onset  . Hypertension Mother     deceased.  . Diabetes Mother   . Other Father     alive - pt unaware of medical hx.   Social History  Substance Use Topics  . Smoking status: Former Smoker -- 0.00 packs/day for 20 years  . Smokeless tobacco: Never Used  . Alcohol Use: No    Review of Systems  Constitutional: Negative for fever, chills, diaphoresis, appetite change and fatigue.  HENT: Negative for mouth sores, sore throat and trouble swallowing.   Eyes: Negative for visual disturbance.  Respiratory: Negative for cough, chest tightness, shortness of breath and wheezing.   Cardiovascular: Negative for chest pain.  Gastrointestinal: Positive for blood in stool, anal bleeding and rectal pain. Negative for nausea, vomiting, abdominal pain, diarrhea and abdominal distention.  Endocrine: Negative for polydipsia, polyphagia and polyuria.  Genitourinary: Negative for dysuria, frequency and hematuria.  Musculoskeletal: Negative for gait problem.  Skin: Negative for color change, pallor and rash.  Neurological: Negative for dizziness, syncope, light-headedness and headaches.  Hematological: Does not bruise/bleed easily.  Psychiatric/Behavioral: Negative for behavioral problems and confusion.      Allergies  Review of patient's allergies indicates no known allergies.  Home Medications   Prior to Admission medications   Medication Sig Start Date End  Date Taking? Authorizing Provider  aspirin EC 81 MG tablet Take 81 mg by mouth every 3 (three) days.    Yes Historical Provider, MD  pantoprazole (PROTONIX) 40 MG tablet Take 1 tablet (40 mg total) by mouth daily. Patient taking differently: Take 40 mg by mouth every 3 (three) days.  05/08/15  Yes Theodis Blaze, MD  potassium chloride SA (K-DUR,KLOR-CON) 20 MEQ tablet Take 20 mEq by mouth daily.   Yes Historical Provider, MD  carvedilol (COREG) 25 MG tablet Take 1 tablet (25 mg  total) by mouth 2 (two) times daily with a meal. 06/23/15   Burtis Junes, NP  furosemide (LASIX) 40 MG tablet Take 1 tablet (40 mg total) by mouth daily. 06/23/15   Burtis Junes, NP  lisinopril (PRINIVIL,ZESTRIL) 20 MG tablet Take 1 tablet (20 mg total) by mouth daily. 06/14/15   Michael Boston, MD   BP 186/107 mmHg  Pulse 70  Temp(Src) 98.3 F (36.8 C) (Oral)  Resp 20  Ht 5\' 11"  (1.803 m)  Wt 195 lb 9.6 oz (88.724 kg)  BMI 27.29 kg/m2  SpO2 99% Physical Exam  Constitutional: He is oriented to person, place, and time. He appears well-developed and well-nourished. No distress.  HENT:  Head: Normocephalic.  Eyes: Conjunctivae are normal. Pupils are equal, round, and reactive to light. No scleral icterus.  Neck: Normal range of motion. Neck supple. No thyromegaly present.  Cardiovascular: Normal rate and regular rhythm.  Exam reveals no gallop and no friction rub.   No murmur heard. Pulmonary/Chest: Effort normal and breath sounds normal. No respiratory distress. He has no wheezes. He has no rales.  Abdominal: Soft. Bowel sounds are normal. He exhibits no distension. There is no tenderness. There is no rebound.  Genitourinary:     Musculoskeletal: Normal range of motion.  Neurological: He is alert and oriented to person, place, and time.  Skin: Skin is warm and dry. No rash noted.  Psychiatric: He has a normal mood and affect. His behavior is normal.    ED Course  Procedures (including critical care time) Labs Review Labs Reviewed  COMPREHENSIVE METABOLIC PANEL - Abnormal; Notable for the following:    Glucose, Bld 156 (*)    ALT 10 (*)    All other components within normal limits  CBC - Abnormal; Notable for the following:    WBC 14.7 (*)    RBC 4.15 (*)    Hemoglobin 12.7 (*)    HCT 38.9 (*)    All other components within normal limits  BASIC METABOLIC PANEL - Abnormal; Notable for the following:    Glucose, Bld 159 (*)    All other components within normal limits    I-STAT CHEM 8, ED - Abnormal; Notable for the following:    Glucose, Bld 153 (*)    Calcium, Ion 1.25 (*)    All other components within normal limits  ANAEROBIC CULTURE  CULTURE, ROUTINE-ABSCESS  CBC  TYPE AND SCREEN  ABO/RH  SURGICAL PATHOLOGY    Imaging Review No results found. I have personally reviewed and evaluated these images and lab results as part of my medical decision-making.   EKG Interpretation   Date/Time:  Friday June 13 2015 11:12:13 EST Ventricular Rate:  73 PR Interval:  187 QRS Duration: 127 QT Interval:  447 QTC Calculation: 493 R Axis:   78 Text Interpretation:  Sinus rhythm Biatrial enlargement LVH with secondary  repolarization abnormality Borderline prolonged QT interval No significant  change from prior. Rate  has normalized. Confirmed by Jeneen Rinks  MD, [place]  [phone]) on 06/13/2015 12:31:01 PM      MDM   Final diagnoses:  Peri-rectal abscess    Discussed with [facility]. Patient will be admitted for I and D    Tanna Furry, MD 06/23/15 2058

## 2015-06-13 NOTE — Anesthesia Procedure Notes (Signed)
Procedure Name: LMA Insertion Date/Time: 06/13/2015 2:29 PM Performed by: Ofilia Neas Pre-anesthesia Checklist: Patient identified, Emergency Drugs available, Suction available, Patient being monitored and Timeout performed Patient Re-evaluated:Patient Re-evaluated prior to inductionOxygen Delivery Method: Circle system utilized Preoxygenation: Pre-oxygenation with 100% oxygen Intubation Type: IV induction LMA: LMA inserted LMA Size: 4.0 Number of attempts: 1

## 2015-06-14 DIAGNOSIS — I255 Ischemic cardiomyopathy: Secondary | ICD-10-CM

## 2015-06-14 DIAGNOSIS — I252 Old myocardial infarction: Secondary | ICD-10-CM | POA: Diagnosis not present

## 2015-06-14 DIAGNOSIS — K612 Anorectal abscess: Secondary | ICD-10-CM | POA: Diagnosis present

## 2015-06-14 DIAGNOSIS — K610 Anal abscess: Secondary | ICD-10-CM

## 2015-06-14 DIAGNOSIS — I2510 Atherosclerotic heart disease of native coronary artery without angina pectoris: Secondary | ICD-10-CM | POA: Diagnosis present

## 2015-06-14 DIAGNOSIS — I272 Other secondary pulmonary hypertension: Secondary | ICD-10-CM | POA: Diagnosis present

## 2015-06-14 DIAGNOSIS — E785 Hyperlipidemia, unspecified: Secondary | ICD-10-CM | POA: Diagnosis present

## 2015-06-14 DIAGNOSIS — Z79899 Other long term (current) drug therapy: Secondary | ICD-10-CM | POA: Diagnosis not present

## 2015-06-14 DIAGNOSIS — Z9119 Patient's noncompliance with other medical treatment and regimen: Secondary | ICD-10-CM | POA: Diagnosis not present

## 2015-06-14 DIAGNOSIS — Z951 Presence of aortocoronary bypass graft: Secondary | ICD-10-CM | POA: Diagnosis not present

## 2015-06-14 DIAGNOSIS — Z95810 Presence of automatic (implantable) cardiac defibrillator: Secondary | ICD-10-CM | POA: Diagnosis not present

## 2015-06-14 DIAGNOSIS — I5042 Chronic combined systolic (congestive) and diastolic (congestive) heart failure: Secondary | ICD-10-CM

## 2015-06-14 DIAGNOSIS — Z833 Family history of diabetes mellitus: Secondary | ICD-10-CM | POA: Diagnosis not present

## 2015-06-14 DIAGNOSIS — F411 Generalized anxiety disorder: Secondary | ICD-10-CM | POA: Diagnosis present

## 2015-06-14 DIAGNOSIS — Z87891 Personal history of nicotine dependence: Secondary | ICD-10-CM | POA: Diagnosis not present

## 2015-06-14 DIAGNOSIS — I110 Hypertensive heart disease with heart failure: Secondary | ICD-10-CM | POA: Diagnosis present

## 2015-06-14 DIAGNOSIS — Z9114 Patient's other noncompliance with medication regimen: Secondary | ICD-10-CM | POA: Diagnosis not present

## 2015-06-14 DIAGNOSIS — Z7982 Long term (current) use of aspirin: Secondary | ICD-10-CM | POA: Diagnosis not present

## 2015-06-14 DIAGNOSIS — I420 Dilated cardiomyopathy: Secondary | ICD-10-CM | POA: Diagnosis present

## 2015-06-14 DIAGNOSIS — L905 Scar conditions and fibrosis of skin: Secondary | ICD-10-CM | POA: Diagnosis present

## 2015-06-14 DIAGNOSIS — Z720 Tobacco use: Secondary | ICD-10-CM

## 2015-06-14 DIAGNOSIS — Z8249 Family history of ischemic heart disease and other diseases of the circulatory system: Secondary | ICD-10-CM | POA: Diagnosis not present

## 2015-06-14 LAB — CBC
HCT: 38.9 % — ABNORMAL LOW (ref 39.0–52.0)
Hemoglobin: 12.7 g/dL — ABNORMAL LOW (ref 13.0–17.0)
MCH: 30.6 pg (ref 26.0–34.0)
MCHC: 32.6 g/dL (ref 30.0–36.0)
MCV: 93.7 fL (ref 78.0–100.0)
PLATELETS: 221 10*3/uL (ref 150–400)
RBC: 4.15 MIL/uL — ABNORMAL LOW (ref 4.22–5.81)
RDW: 14.3 % (ref 11.5–15.5)
WBC: 14.7 10*3/uL — ABNORMAL HIGH (ref 4.0–10.5)

## 2015-06-14 LAB — CUP PACEART REMOTE DEVICE CHECK
Battery Remaining Longevity: 144 mo
Battery Remaining Percentage: 100 %
Date Time Interrogation Session: 20170201060500
HighPow Impedance: 53 Ohm
Implantable Lead Location: 753860
Implantable Lead Model: 293
Lead Channel Pacing Threshold Pulse Width: 0.4 ms
Lead Channel Setting Sensing Sensitivity: 0.6 mV
MDC IDC LEAD IMPLANT DT: 20160307
MDC IDC LEAD SERIAL: 349317
MDC IDC MSMT LEADCHNL RV IMPEDANCE VALUE: 380 Ohm
MDC IDC MSMT LEADCHNL RV PACING THRESHOLD AMPLITUDE: 1.9 V
MDC IDC SET LEADCHNL RV PACING AMPLITUDE: 3 V
MDC IDC SET LEADCHNL RV PACING PULSEWIDTH: 0.4 ms
MDC IDC STAT BRADY RV PERCENT PACED: 0 %
Pulse Gen Serial Number: 201422

## 2015-06-14 LAB — BASIC METABOLIC PANEL
ANION GAP: 9 (ref 5–15)
BUN: 15 mg/dL (ref 6–20)
CHLORIDE: 108 mmol/L (ref 101–111)
CO2: 24 mmol/L (ref 22–32)
Calcium: 9.1 mg/dL (ref 8.9–10.3)
Creatinine, Ser: 1.17 mg/dL (ref 0.61–1.24)
GFR calc Af Amer: >60 mL/min (ref >60)
GLUCOSE: 159 mg/dL — AB (ref 65–99)
POTASSIUM: 4.3 mmol/L (ref 3.5–5.1)
SODIUM: 141 mmol/L (ref 135–145)

## 2015-06-14 MED ORDER — SPIRONOLACTONE 25 MG PO TABS
25.0000 mg | ORAL_TABLET | Freq: Every day | ORAL | Status: DC
  Administered 2015-06-14: 25 mg via ORAL
  Filled 2015-06-14: qty 1

## 2015-06-14 MED ORDER — LISINOPRIL 20 MG PO TABS: 20.0000 mg | ORAL_TABLET | Freq: Every day | ORAL | Status: DC

## 2015-06-14 MED ORDER — HYDRALAZINE HCL 25 MG PO TABS
25.0000 mg | ORAL_TABLET | Freq: Three times a day (TID) | ORAL | Status: DC
  Administered 2015-06-14 (×2): 25 mg via ORAL
  Filled 2015-06-14 (×4): qty 1

## 2015-06-14 MED ORDER — OXYCODONE HCL 5 MG PO TABS: 5.0000 mg | ORAL_TABLET | ORAL | Status: DC | PRN

## 2015-06-14 MED ORDER — AMOXICILLIN-POT CLAVULANATE 875-125 MG PO TABS: 1.0000 | ORAL_TABLET | Freq: Two times a day (BID) | ORAL | Status: DC

## 2015-06-14 MED ORDER — HYDRALAZINE HCL 25 MG PO TABS: 25.0000 mg | ORAL_TABLET | Freq: Four times a day (QID) | ORAL | Status: DC

## 2015-06-14 NOTE — Progress Notes (Signed)
Patient states he is ready to leave he feels fine and he is not staying here another day. Informed patient that cardiology has adjusted some of his BP medications and wants him to stay until morning to see if BP medications are working because is BP is still running high with SBP 180's.  Patient states he did not come here for BP he came for abscess and he is ready to go.  Notified Tenny Craw, PA (cardiology) of situation, he states patient is not medically ready for d/c will need to sign out AMA. Also notified  Dr. Johney Maine of situation.

## 2015-06-14 NOTE — Progress Notes (Signed)
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 Johney Maine.  No CP, no SOB. Pain.   Objective:  Vital Signs in the last 24 hours: Temp:  [97.6 F (36.4 C)-98.9 F (37.2 C)] 98.6 F (37 C) (03/04 0556) Pulse Rate:  [56-80] 69 (03/04 0556) Resp:  [9-21] 16 (03/04 0556) BP: (159-210)/(87-124) 188/100 mmHg (03/04 0556) SpO2:  [89 %-100 %] 98 % (03/04 0556)  Intake/Output from previous day: 03/03 0701 - 03/04 0700 In: 2517.5 [P.O.:960; I.V.:1557.5] Out: 200 [Urine:200]   Physical Exam: General: Well developed, well nourished, in no acute distress. Head:  Normocephalic and atraumatic. Lungs: Clear to auscultation and percussion. No significant crackles Heart: Normal S1 and S2.  No murmur, rubs or gallops. ICD Abdomen: soft, non-tender, positive bowel sounds. Extremities: No clubbing or cyanosis. No edema. Neurologic: Alert and oriented x 3.    Lab Results:  Recent Labs  06/13/15 0845 06/13/15 0919 06/14/15 0527  WBC 8.9  --  14.7*  HGB 15.0 16.7 12.7*  PLT 241  --  221    Recent Labs  06/13/15 0845 06/13/15 0919 06/14/15 0527  NA 141 144 141  K 3.6 3.6 4.3  CL 107 105 108  CO2 25  --  24  GLUCOSE 156* 153* 159*  BUN 13 12 15   CREATININE 1.24 1.10 1.17    Hepatic Function Panel  Recent Labs  06/13/15 0845  PROT 7.7  ALBUMIN 4.0  AST 25  ALT 10*  ALKPHOS 98  BILITOT 0.6    Imaging: Ct Abdomen Pelvis W Contrast  06/13/2015  CLINICAL DATA:  Rectal bleeding EXAM: CT ABDOMEN AND PELVIS WITH CONTRAST TECHNIQUE: Multidetector CT imaging of the abdomen and pelvis was performed using the standard protocol following bolus administration of intravenous contrast. CONTRAST:  166mL OMNIPAQUE IOHEXOL 300 MG/ML  SOLN COMPARISON:   05/06/2015 FINDINGS: Lung bases are free of acute infiltrate or sizable effusion. The heart is enlarged. The liver, gallbladder, spleen, adrenal glands and pancreas are within normal limits. Kidneys are well visualized with a normal enhancement pattern bilaterally. No renal calculi or urinary tract obstructive changes are seen. A left renal cyst is noted simple in nature. Aortoiliac calcifications are noted without aneurysmal dilatation. The appendix is well visualized and within normal limits. Very mild diverticular change is noted without evidence of diverticulitis. The bladder is partially distended. Degenerative changes of the lumbar spine are seen. On image number 84 of series 2 in the perianal region there is a 2.0 cm peripherally enhancing area of fluid attenuation. This raises suspicion for possible perianal abscess. No air is identified in this region. Clinical correlation is recommended with the physical exam. IMPRESSION: Changes suggestive of a small perianal abscess. No other acute abnormality is noted. Electronically Signed   By: Inez Catalina M.D.   On: 06/13/2015 10:17   Dg Chest Port 1 View  06/13/2015  CLINICAL DATA:  Rectal bleeding and perirectal abscess. EXAM: PORTABLE CHEST 1 VIEW COMPARISON:  05/21/2015 FINDINGS: Stable cardiac enlargement and radiographic appearance of ICD. Stable chronic pulmonary venous hypertension and probable chronic edema without overt acute airspace edema or pleural effusion. IMPRESSION: Stable cardiac enlargement and appearance of indwelling ICD. Stable chronic pulmonary venous hypertension and chronic edema. Electronically Signed   By: Aletta Edouard M.D.   On: 06/13/2015 11:30  Personally viewed.    EKG:  Sinus rhythm, LVH, deep T-wave inversion anterior, inferior laterally Personally viewed. No change from prior EKG  Cardiac Studies:  ECHO 05/08/15: - Left ventricle: The cavity size was mildly dilated. There was severe concentric hypertrophy with more  hypetrophy of the septum. Systolic function was severely reduced. The estimated ejection fraction was in the range of 25% to 30%. Diffuse hypokinesis. Features are consistent with a pseudonormal left ventricular filling pattern, with concomitant abnormal relaxation and increased filling pressure (grade 2 diastolic dysfunction). Doppler parameters are consistent with high ventricular filling pressure. - Aortic valve: There was mild to moderate regurgitation. Regurgitation pressure half-time: 541 ms. - Mitral valve: There was mild regurgitation. - Left atrium: The atrium was severely dilated. - Right ventricle: The cavity size was normal. Wall thickness was normal. Systolic function was mildly reduced. - Right atrium: The atrium was mildly dilated. - Atrial septum: The septum bowed from left to right, consistent with increased left atrial pressure. No defect or patent foramen ovale was identified. - Tricuspid valve: There was moderate regurgitation. - Pulmonary arteries: PA peak pressure: 33 mm Hg (S). - Global longitudinal strain -7%.  Meds: Scheduled Meds: . acetaminophen  1,000 mg Oral TID  . bupivacaine liposome  20 mL Infiltration On Call to OR  . carvedilol  25 mg Oral BID WC  . cefTRIAXone (ROCEPHIN)  IV  2 g Intravenous Q24H  . docusate sodium  100 mg Oral BID  . enoxaparin (LOVENOX) injection  40 mg Subcutaneous Q24H  . furosemide  40 mg Oral Daily  . lip balm  1 application Topical BID  . lisinopril  10 mg Oral BID  . metronidazole  500 mg Intravenous Q8H  . pantoprazole  40 mg Oral Daily  . polyethylene glycol  17 g Oral BID  . saccharomyces boulardii  250 mg Oral BID  . sodium chloride flush  3 mL Intravenous Q12H   Continuous Infusions: . 0.9 % NaCl with KCl 20 mEq / L 1,000 mL (06/13/15 2231)   PRN Meds:.sodium chloride, acetaminophen **OR** acetaminophen, alum & mag hydroxide-simeth, diphenhydrAMINE **OR** diphenhydrAMINE, fentaNYL  (SUBLIMAZE) injection, hydrALAZINE, hydrocortisone-pramoxine, HYDROmorphone (DILAUDID) injection, lactated ringers, LORazepam, magic mouthwash, menthol-cetylpyridinium, methocarbamol, metoprolol, metoprolol tartrate, nicotine polacrilex, ondansetron (ZOFRAN) IV, oxyCODONE, phenol, polyethylene glycol, promethazine, simethicone, sodium chloride flush, witch hazel-glycerin, zolpidem  Assessment/Plan:  Principal Problem:   Perianal abscess s/p I&D 06/13/2015 Active Problems:   Perirectal abscess  Perirectal abscess -Per Dr. Johney Maine team -Metronidazole IV -Incision and drainage [id number]  Chronic systolic heart failure -EF 25-30% -Appears well compensated -Be careful with IV fluids -Continue with Lasix -I will add spironolactone 25 mg once a day which should not only help with cardiomyopathy but also with blood pressure. We will continue to monitor potassium -Continue with carvedilol and lisinopril -Will add hydralazine.  -Has had a history of noncompliance in the past.  ICD -Functioning well -Dr. [name]  Coronary artery disease -Status post CABG times 06/29/2009 CABG (LIMA-LAD, SVG-OM 2, free radial-PDA).  -No anginal symptoms  Ischemic cardiomyopathy -As above  Hypertensive heart disease with heart failure -Trying to control with medications -Still quite elevated. 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

## 2015-06-14 NOTE — Progress Notes (Signed)
[facility]:  [phone] General Surgery Progress Note    POD -  1 Day Post-Op  Assessment/Plan: 1.  IRRIGATION AND DEBRIDEMENT PERIRECTAL ABSCESS x 3 - 06/13/2015 - Gross.  Elza Rafter out.  Wound looks okay.  He feels much better  WBC - 14,700 - 06/14/2015  Cefriaxone/Flagyl  To start sitz baths  2.  CAD  Multivessel CABG 2011  ICD   EF 25%  Seen by Dr. Luther Parody.  Because of her HTN, Dr. Luther Parody has asked patient to stay another day.  I am not sure that he is going to do that. 3.  HTN  Spironolactone added 4.  Smokes  5.  DVT prophylaxis - Lovenox   Principal Problem:   Perianal abscess s/p I&D 06/13/2015 Active Problems:   H/O medication noncompliance   CAD, multiple vessel s/p CABG 2011    Ischemic cardiomyopathy   Tobacco abuse disorder   Chronic combined systolic (congestive) and diastolic (congestive) heart failure (HCC)   Perirectal abscess   Subjective:  Patient is anxious to go home. He says that he feels much better.  His BP remains high.  I discussed Dr. [name] request to stay another day, but I am not sure that he will.  Objective:   Filed Vitals:   06/14/15 1004 06/14/15 1007  BP: 193/103 186/95  Pulse:  97  Temp:    Resp:       Intake/Output from previous day:  03/03 0701 - 03/04 0700 In: 2517.5 [P.O.:960; I.V.:1557.5] Out: 200 [Urine:200]  Intake/Output this shift:      Physical Exam:   General: AA M who is alert and oriented.    HEENT: Normal. Pupils equal. .   Lungs: Clear   Wound: Rectal wound anteriorly is clean.  I pulled out wick.   Lab Results:    Recent Labs  06/13/15 0845 06/13/15 0919 06/14/15 0527  WBC 8.9  --  14.7*  HGB 15.0 16.7 12.7*  HCT 44.9 49.0 38.9*  PLT 241  --  221    BMET   Recent Labs  06/13/15 0845 06/13/15 0919 06/14/15 0527  NA 141 144 141  K 3.6 3.6 4.3  CL 107 105 108  CO2 25  --  24  GLUCOSE 156* 153* 159*  BUN 13 12 15   CREATININE 1.24 1.10 1.17  CALCIUM 9.5  --  9.1     PT/INR  No results for input(s): LABPROT, INR in the last 72 hours.  ABG  No results for input(s): PHART, HCO3 in the last 72 hours.  Invalid input(s): PCO2, PO2   Studies/Results:  Ct Abdomen Pelvis W Contrast  06/13/2015  CLINICAL DATA:  Rectal bleeding EXAM: CT ABDOMEN AND PELVIS WITH CONTRAST TECHNIQUE: Multidetector CT imaging of the abdomen and pelvis was performed using the standard protocol following bolus administration of intravenous contrast. CONTRAST:  121mL OMNIPAQUE IOHEXOL 300 MG/ML  SOLN COMPARISON:  05/06/2015 FINDINGS: Lung bases are free of acute infiltrate or sizable effusion. The heart is enlarged. The liver, gallbladder, spleen, adrenal glands and pancreas are within normal limits. Kidneys are well visualized with a normal enhancement pattern bilaterally. No renal calculi or urinary tract obstructive changes are seen. A left renal cyst is noted simple in nature. Aortoiliac calcifications are noted without aneurysmal dilatation. The appendix is well visualized and within normal limits. Very mild diverticular change is noted without evidence of diverticulitis. The bladder is partially distended. Degenerative changes of the lumbar spine are seen. On image number 84 of series  2 in the perianal region there is a 2.0 cm peripherally enhancing area of fluid attenuation. This raises suspicion for possible perianal abscess. No air is identified in this region. Clinical correlation is recommended with the physical exam. IMPRESSION: Changes suggestive of a small perianal abscess. No other acute abnormality is noted. Electronically Signed   By: Inez Catalina M.D.   On: 06/13/2015 10:17   Dg Chest Port 1 View  06/13/2015  CLINICAL DATA:  Rectal bleeding and perirectal abscess. EXAM: PORTABLE CHEST 1 VIEW COMPARISON:  05/21/2015 FINDINGS: Stable cardiac enlargement and radiographic appearance of ICD. Stable chronic pulmonary venous hypertension and probable chronic edema without overt acute  airspace edema or pleural effusion. IMPRESSION: Stable cardiac enlargement and appearance of indwelling ICD. Stable chronic pulmonary venous hypertension and chronic edema. Electronically Signed   By: Aletta Edouard M.D.   On: 06/13/2015 11:30     Anti-infectives:   Anti-infectives    Start     Dose/Rate Route Frequency Ordered Stop   06/13/15 1300  cefTRIAXone (ROCEPHIN) 2 g in dextrose 5 % 50 mL IVPB    Comments:  Pharmacy may adjust dosing strength / duration / interval for maximal efficacy   2 g 100 mL/hr over 30 Minutes Intravenous Every 24 hours 06/13/15 1255     06/13/15 1300  metroNIDAZOLE (FLAGYL) IVPB 500 mg     500 mg 100 mL/hr over 60 Minutes Intravenous Every 8 hours 06/13/15 1255        Alphonsa Overall, MD, FACS Pager: [facility]: [phone] 06/14/2015  \

## 2015-06-14 NOTE — Progress Notes (Signed)
Normal remote reviewed. V impedance trending down, will need to monitor.   [employer] 08/13/15

## 2015-06-14 NOTE — Progress Notes (Signed)
Patient informed of dangers/risk  associated with high blood pressures. Patient acknowledged death and stroke as immediate threat/risk when leaving hospital against medical advice. Patient signed AMA paper and  peripheral IV discontinued. Advised patient to follow up with his cardiologist as soon as possible.

## 2015-06-14 NOTE — Discharge Instructions (Signed)
ANORECTAL SURGERY:  POST OPERATIVE INSTRUCTIONS  1. Take your usually prescribed home medications unless otherwise directed. 2. DIET: Follow a light bland diet the first 24 hours after arrival home, such as soup, liquids, crackers, etc.  Be sure to include lots of fluids daily.  Avoid fast food or heavy meals as your are more likely to get nauseated.  Eat a low fat the next few days after surgery.   3. PAIN CONTROL: a. Pain is best controlled by a usual combination of three different methods TOGETHER: i. Ice/Heat ii. Over the counter pain medication iii. Prescription pain medication b. Most patients will experience some swelling and discomfort in the anus/rectal area. and incisions.  Ice packs or heat (30-60 minutes up to 6 times a day) will help. Use ice for the first few days to help decrease swelling and bruising, then switch to heat such as warm towels, sitz baths, warm baths, etc to help relax tight/sore spots and speed recovery.  Some people prefer to use ice alone, heat alone, alternating between ice & heat.  Experiment to what works for you.  Swelling and bruising can take several weeks to resolve.   c. It is helpful to take an over-the-counter pain medication regularly for the first few weeks.  Choose one of the following that works best for you: i. Naproxen (Aleve, etc)  Two '220mg'$  tabs twice a day ii. Ibuprofen (Advil, etc) Three '200mg'$  tabs four times a day (every meal & bedtime) iii. Acetaminophen (Tylenol, etc) 500-'650mg'$  four times a day (every meal & bedtime) d. A  prescription for pain medication (such as oxycodone, hydrocodone, etc) should be given to you upon discharge.  Take your pain medication as prescribed.  i. If you are having problems/concerns with the prescription medicine (does not control pain, nausea, vomiting, rash, itching, etc), please call us [phone] to see if we need to switch you to a different pain medicine that will work better for you and/or control your  side effect better. ii. If you need a refill on your pain medication, please contact your pharmacy.  They will contact our office to request authorization. Prescriptions will not be filled after 5 pm or on week-ends.  Use a Sitz Bath 4-8 times a day for relief   [employer] A sitz bath is a warm water bath taken in the sitting position that covers only the hips and buttocks. It may be used for either healing or hygiene purposes. Sitz baths are also used to relieve pain, itching, or muscle spasms. The water may contain medicine. Moist heat will help you heal and relax.  HOME CARE INSTRUCTIONS  Take 3 to 4 sitz baths a day.  Fill the bathtub half full with warm water.  Sit in the water and open the drain a little.  Turn on the warm water to keep the tub half full. Keep the water running constantly.  Soak in the water for 15 to 20 minutes.  After the sitz bath, pat the affected area dry first.   4. KEEP YOUR BOWELS REGULAR a. The goal is one bowel movement a day b. Avoid getting constipated.  Between the surgery and the pain medications, it is common to experience some constipation.  Increasing fluid intake and taking a fiber supplement (such as Metamucil, Citrucel, FiberCon, MiraLax, etc) 1-2 times a day regularly will usually help prevent this problem from occurring.  A mild laxative (prune juice, Milk of Magnesia, MiraLax, etc) should be taken according to package  directions if there are no bowel movements after 48 hours. c. Watch out for diarrhea.  If you have many loose bowel movements, simplify your diet to bland foods & liquids for a few days.  Stop any stool softeners and decrease your fiber supplement.  Switching to mild anti-diarrheal medications (Kayopectate, Pepto Bismol) can help.  If this worsens or does not improve, please call us.  5. Wound Care  a. Remove your bandages the day after surgery.  Unless discharge instructions indicate otherwise, leave your bandage dry and in  place overnight.  Remove the bandage during your first bowel movement.   b. Wear an absorbent pad or soft cotton gauze in your underwear as needed to catch any drainage and help keep the area  c. Keep the area clean and dry.  Bathe / shower every day.  Keep the area clean by showering / bathing over the incision / wound.   It is okay to soak an open wound to help wash it.  Wet wipes or showers / gentle washing after bowel movements is often less traumatic than regular toilet paper. d. Dennis Bast will often notice bleeding with bowel movements.  This should slow down by the end of the first week of surgery e. Expect some drainage.  This should slow down, too, by the end of the first week of surgery.  Wear an absorbent pad or soft cotton gauze in your underwear until the drainage stops.  6. ACTIVITIES as tolerated:   a. You may resume regular (light) daily activities beginning the next day--such as daily self-care, walking, climbing stairs--gradually increasing activities as tolerated.  If you can walk 30 minutes without difficulty, it is safe to try more intense activity such as jogging, treadmill, bicycling, low-impact aerobics, swimming, etc. b. Save the most intensive and strenuous activity for last such as sit-ups, heavy lifting, contact sports, etc  Refrain from any heavy lifting or straining until you are off narcotics for pain control.   c. DO NOT PUSH THROUGH PAIN.  Let pain be your guide: If it hurts to do something, don't do it.  Pain is your body warning you to avoid that activity for another week until the pain goes down. d. You may drive when you are no longer taking prescription pain medication, you can comfortably sit for long periods of time, and you can safely maneuver your car and apply brakes. e. Dennis Bast may have sexual intercourse when it is comfortable.  7. FOLLOW UP in our office a. Please call [facility] at [phone] to set up an appointment to see your surgeon in the office for a follow-up  appointment approximately 2 weeks after your surgery. b. Make sure that you call for this appointment the day you arrive home to insure a convenient appointment time. 10. IF YOU HAVE DISABILITY OR FAMILY LEAVE FORMS, BRING THEM TO THE OFFICE FOR PROCESSING.  DO NOT GIVE THEM TO YOUR DOCTOR.        WHEN TO CALL us [phone]: 1. Poor pain control 2. Reactions / problems with new medications (rash/itching, nausea, etc)  3. Fever over 101.5 F (38.5 C) 4. Inability to urinate 5. Nausea and/or vomiting 6. Worsening swelling or bruising 7. Continued bleeding from incision. 8. Increased pain, redness, or drainage from the incision  The clinic staff is available to answer your questions during regular business hours (8:30am-5pm).  Please dont hesitate to call and ask to speak to one of our nurses for clinical concerns.   A  surgeon from [facility] is always on call at the hospitals   If you have a medical emergency, go to the nearest emergency room or call 911.    [facility], [place], [place], [place], [place]  [address] ? MAIN: [phone] ? TOLL FREE: [phone] ? FAX [phone] [email or web address]   Managing Pain  Pain after surgery or related to activity is often due to strain/injury to muscle, tendon, nerves and/or incisions.  This pain is usually short-term and will improve in a few months.   Many people find it helpful to do the following things TOGETHER to help speed the process of healing and to get back to regular activity more quickly:  1. Avoid heavy physical activity at first a. No lifting greater than 20 pounds at first, then increase to lifting as tolerated over the next few weeks b. Do not push through the pain.  Listen to your body and avoid positions and maneuvers than reproduce the pain.  Wait a few days before trying something more intense c. Walking is okay as tolerated, but go slowly and  stop when getting sore.  If you can walk 30 minutes without stopping or pain, you can try more intense activity (running, jogging, aerobics, cycling, swimming, treadmill, sex, sports, weightlifting, etc ) d. Remember: If it hurts to do it, then dont do it!  2. Take Acetaminophen Anti-inflammatory medication i. Acetaminophen 500mg  tabs (Tylenol) 1-2 pills with every meal and just before bedtime (avoid if you have liver problems) ii. Take with food/snack around the clock for 1-2 weeks iii. This helps the muscle and nerve tissues become less irritable and calm down faster  3. Use a Heating pad or Ice/Cold Pack a. 4-6 times a day b. May use warm bath/hottub  or showers  4. Try Gentle Massage and/or Stretching  a. at the area of pain many times a day b. stop if you feel pain - do not overdo it  Try these steps together to help you body heal faster and avoid making things get worse.  Doing just one of these things may not be enough.    If you are not getting better after two weeks or are noticing you are getting worse, contact our office for further advice; we may need to re-evaluate you & see what other things we can do to help.  GETTING TO GOOD BOWEL HEALTH. Irregular bowel habits such as constipation and diarrhea can lead to many problems over time.  Having one soft bowel movement a day is the most important way to prevent further problems.  The anorectal canal is designed to handle stretching and feces to safely manage our ability to get rid of solid waste (feces, poop, stool) out of our body.  BUT, hard constipated stools can act like ripping concrete bricks and diarrhea can be a burning fire to this very sensitive area of our body, causing inflamed hemorrhoids, anal fissures, increasing risk is perirectal abscesses, abdominal pain/bloating, an making irritable bowel worse.      The goal: ONE SOFT BOWEL MOVEMENT A DAY!  To have soft, regular bowel movements:   Drink plenty of fluids,  consider 4-6 tall glasses of water a day.    Take plenty of fiber.  Fiber is the undigested part of plant food that passes into the colon, acting s natures broom to encourage bowel motility and movement.  Fiber can absorb and hold large amounts of water. This results in a larger,  bulkier stool, which is soft and easier to pass. Work gradually over several weeks up to 6 servings a day of fiber (25g a day even more if needed) in the form of: o Vegetables -- Root (potatoes, carrots, turnips), leafy green (lettuce, salad greens, celery, spinach), or cooked high residue (cabbage, broccoli, etc) o Fruit -- Fresh (unpeeled skin & pulp), Dried (prunes, apricots, cherries, etc ),  or stewed ( applesauce)  o Whole grain breads, pasta, etc (whole wheat)  o Bran cereals   Bulking Agents -- This type of water-retaining fiber generally is easily obtained each day by one of the following:  o Psyllium bran -- The psyllium plant is remarkable because its ground seeds can retain so much water. This product is available as Metamucil, Konsyl, Effersyllium, Per Diem Fiber, or the less expensive generic preparation in drug and health food stores. Although labeled a laxative, it really is not a laxative.  o Methylcellulose -- This is another fiber derived from wood which also retains water. It is available as Citrucel. o Polyethylene Glycol - and artificial fiber commonly called Miralax or Glycolax.  It is helpful for people with gassy or bloated feelings with regular fiber o Flax Seed - a less gassy fiber than psyllium  No reading or other relaxing activity while on the toilet. If bowel movements take longer than 5 minutes, you are too constipated  AVOID CONSTIPATION.  High fiber and water intake usually takes care of this.  Sometimes a laxative is needed to stimulate more frequent bowel movements, but   Laxatives are not a good long-term solution as it can wear the colon out.  They can help jump-start bowels if  constipated, but should be relied on constantly without discussing with your doctor o Osmotics (Milk of Magnesia, Fleets phosphosoda, Magnesium citrate, MiraLax, GoLytely) are safer than  o Stimulants (Senokot, Castor Oil, Dulcolax, Ex Lax)    o Avoid taking laxatives for more than 7 days in a row.   IF SEVERELY CONSTIPATED, try a Bowel Retraining Program: o Do not use laxatives.  o Eat a diet high in roughage, such as bran cereals and leafy vegetables.  o Drink six (6) ounces of prune or apricot juice each morning.  o Eat two (2) large servings of stewed fruit each day.  o Take one (1) heaping tablespoon of a psyllium-based bulking agent twice a day. Use sugar-free sweetener when possible to avoid excessive calories.  o Eat a normal breakfast.  o Set aside 15 minutes after breakfast to sit on the toilet, but do not strain to have a bowel movement.  o If you do not have a bowel movement by the third day, use an enema and repeat the above steps.   Controlling diarrhea o Switch to liquids and simpler foods for a few days to avoid stressing your intestines further. o Avoid dairy products (especially milk & ice cream) for a short time.  The intestines often can lose the ability to digest lactose when stressed. o Avoid foods that cause gassiness or bloating.  Typical foods include beans and other legumes, cabbage, broccoli, and dairy foods.  Every person has some sensitivity to other foods, so listen to our body and avoid those foods that trigger problems for you. o Adding fiber (Citrucel, Metamucil, psyllium, Miralax) gradually can help thicken stools by absorbing excess fluid and retrain the intestines to act more normally.  Slowly increase the dose over a few weeks.  Too much fiber too soon can backfire  and cause cramping & bloating. o Probiotics (such as active yogurt, Align, etc) may help repopulate the intestines and colon with normal bacteria and calm down a sensitive digestive tract.  Most  studies show it to be of mild help, though, and such products can be costly. o Medicines: - Bismuth subsalicylate (ex. Kayopectate, Pepto Bismol) every 30 minutes for up to 6 doses can help control diarrhea.  Avoid if pregnant. - Loperamide (Immodium) can slow down diarrhea.  Start with two tablets (4mg  total) first and then try one tablet every 6 hours.  Avoid if you are having fevers or severe pain.  If you are not better or start feeling worse, stop all medicines and call your doctor for advice o Call your doctor if you are getting worse or not better.  Sometimes further testing (cultures, endoscopy, X-ray studies, bloodwork, etc) may be needed to help diagnose and treat the cause of the diarrhea.  TROUBLESHOOTING IRREGULAR BOWELS 1) Avoid extremes of bowel movements (no bad constipation/diarrhea) 2) Miralax 17gm mixed in 8oz. water or juice-daily. May use BID as needed.  3) Gas-x,Phazyme, etc. as needed for gas & bloating.  4) Soft,bland diet. No spicy,greasy,fried foods.  5) Prilosec over-the-counter as needed  6) May hold gluten/wheat products from diet to see if symptoms improve.  7)  May try probiotics (Align, Activa, etc) to help calm the bowels down 7) If symptoms become worse call back immediately.  STOP SMOKING!  We strongly recommend that you stop smoking.  Smoking increases the risk of surgery including infection in the form of an open wound, pus formation, abscess, hernia at an incision on the abdomen, etc.  You have an increased risk of other MAJOR complications such as stroke, heart attack, forming clots in the leg and/or lungs, and death.    Smoking Cessation Quitting smoking is important to your health and has many advantages. However, it is not always easy to quit since nicotine is a very addictive drug. Often times, people try 3 times or more before being able to quit. This document explains the best ways for you to prepare to quit smoking. Quitting takes hard work and a lot  of effort, but you can do it. ADVANTAGES OF QUITTING SMOKING  You will live longer, feel better, and live better.  Your body will feel the impact of quitting smoking almost immediately.  Within 20 minutes, blood pressure decreases. Your pulse returns to its normal level.  After 8 hours, carbon monoxide levels in the blood return to normal. Your oxygen level increases.  After 24 hours, the chance of having a heart attack starts to decrease. Your breath, hair, and body stop smelling like smoke.  After 48 hours, damaged nerve endings begin to recover. Your sense of taste and smell improve.  After 72 hours, the body is virtually free of nicotine. Your bronchial tubes relax and breathing becomes easier.  After 2 to 12 weeks, lungs can hold more air. Exercise becomes easier and circulation improves.  The risk of having a heart attack, stroke, cancer, or lung disease is greatly reduced.  After 1 year, the risk of coronary heart disease is cut in half.  After 5 years, the risk of stroke falls to the same as a nonsmoker.  After 10 years, the risk of lung cancer is cut in half and the risk of other cancers decreases significantly.  After 15 years, the risk of coronary heart disease drops, usually to the level of a nonsmoker.  If you  are pregnant, quitting smoking will improve your chances of having a healthy baby.  The people you live with, especially any children, will be healthier.  You will have extra money to spend on things other than cigarettes. QUESTIONS TO THINK ABOUT BEFORE ATTEMPTING TO QUIT You may want to talk about your answers with your caregiver.  Why do you want to quit?  If you tried to quit in the past, what helped and what did not?  What will be the most difficult situations for you after you quit? How will you plan to handle them?  Who can help you through the tough times? Your family? Friends? A caregiver?  What pleasures do you get from smoking? What ways can  you still get pleasure if you quit? Here are some questions to ask your caregiver:  How can you help me to be successful at quitting?  What medicine do you think would be best for me and how should I take it?  What should I do if I need more help?  What is smoking withdrawal like? How can I get information on withdrawal? GET READY  Set a quit date.  Change your environment by getting rid of all cigarettes, ashtrays, matches, and lighters in your home, car, or work. Do not let people smoke in your home.  Review your past attempts to quit. Think about what worked and what did not. GET SUPPORT AND ENCOURAGEMENT You have a better chance of being successful if you have help. You can get support in many ways.  Tell your family, friends, and co-workers that you are going to quit and need their support. Ask them not to smoke around you.  Get individual, group, or telephone counseling and support. Programs are available at [employer] and health centers. Call your local health department for information about programs in your area.  Spiritual beliefs and practices may help some smokers quit.  Download a "quit meter" on your computer to keep track of quit statistics, such as how long you have gone without smoking, cigarettes not smoked, and money saved.  Get a self-help book about quitting smoking and staying off of tobacco. [place] yourself from urges to smoke. Talk to someone, go for a walk, or occupy your time with a task.  Change your normal routine. Take a different route to work. Drink tea instead of coffee. Eat breakfast in a different place.  Reduce your stress. Take a hot bath, exercise, or read a book.  Plan something enjoyable to do every day. Reward yourself for not smoking.  Explore interactive web-based programs that specialize in helping you quit. GET MEDICINE AND USE IT CORRECTLY Medicines can help you stop smoking and decrease the  urge to smoke. Combining medicine with the above behavioral methods and support can greatly increase your chances of successfully quitting smoking.  Nicotine replacement therapy helps deliver nicotine to your body without the negative effects and risks of smoking. Nicotine replacement therapy includes nicotine gum, lozenges, inhalers, nasal sprays, and skin patches. Some may be available over-the-counter and others require a prescription.  Antidepressant medicine helps people abstain from smoking, but how this works is unknown. This medicine is available by prescription.  Nicotinic receptor partial agonist medicine simulates the effect of nicotine in your brain. This medicine is available by prescription. Ask your caregiver for advice about which medicines to use and how to use them based on your health history. Your caregiver will tell you what side  effects to look out for if you choose to be on a medicine or therapy. Carefully read the information on the package. Do not use any other product containing nicotine while using a nicotine replacement product.  RELAPSE OR DIFFICULT SITUATIONS Most relapses occur within the first 3 months after quitting. Do not be discouraged if you start smoking again. Remember, most people try several times before finally quitting. You may have symptoms of withdrawal because your body is used to nicotine. You may crave cigarettes, be irritable, feel very hungry, cough often, get headaches, or have difficulty concentrating. The withdrawal symptoms are only temporary. They are strongest when you first quit, but they will go away within 10 14 days. To reduce the chances of relapse, try to:  Avoid drinking alcohol. Drinking lowers your chances of successfully quitting.  Reduce the amount of caffeine you consume. Once you quit smoking, the amount of caffeine in your body increases and can give you symptoms, such as a rapid heartbeat, sweating, and anxiety.  Avoid smokers  because they can make you want to smoke.  Do not let weight gain distract you. Many smokers will gain weight when they quit, usually less than 10 pounds. Eat a healthy diet and stay active. You can always lose the weight gained after you quit.  Find ways to improve your mood other than smoking. FOR MORE INFORMATION  www.smokefree.gov    While it can be one of the most difficult things to do, the [place] community has programs to help you stop.  Consider talking with your primary care physician about options.  Also, Smoking Cessation classes are available through the [facility]:  The smoking cessation program is a proven-effective program from the American Lung Association. The program is available for anyone 94 and older who currently smokes. The program lasts for 7 weeks and is 8 sessions. Each class will be approximately 1 1/2 hours. The program is every Tuesday.  All classes are 12-1:30pm and same location.  Event Location Information:  Location: [facility] 2nd Floor Conference Room 2-037; located next to [facility] cross streets: [place] Entrance into the [facility] is adjacent to the [employer] main entrance. The conference room is located on the 2nd floor.  Parking Instructions: Visitor parking is adjacent to [employer] main entrance and the [place]    A smoking cessation program is also offered through the [facility]. Register online at ClickDebate.gl or call [phone] for more information.   Tobacco cessation counseling is available at [facility]. Call [phone] for a free appointment.   Tobacco cessation classes also are available through the [facility] in [place]. For information, call [phone].   The Patient Education Network features videos on tobacco cessation. Please consult your listings in the center of  this book to find instructions on how to access this resource.   If you want more information, ask your nurse.   Perirectal Abscess An abscess is an infected area that contains a collection of pus. A perirectal abscess is an abscess that is near the opening of the anus or around the rectum. A perirectal abscess can cause a lot of pain, especially during bowel movements. CAUSES This condition is almost always caused by an infection that starts in an anal gland. RISK FACTORS This condition is more likely to develop in:  People with diabetes or inflammatory bowel disease.  People whose body defense system (immune system) is weak.  People who have anal sex.  People who have a sexually transmitted disease (STD).  People who have certain kinds of cancers, such as rectal carcinoma, leukemia, or lymphoma. SYMPTOMS The main symptom of this condition is pain. The pain may be a throbbing pain that gets worse during bowel movements. Other symptoms include:  Fever.  Swelling.  Redness.  Bleeding.  Constipation. DIAGNOSIS The condition is diagnosed with a physical exam. If the abscess is not visible, a health care provider may need to place a finger inside the rectum to find the abscess. Sometimes, imaging tests are done to determine the size and location of the abscess. These tests may include:  An ultrasound.  An MRI.  A CT scan. TREATMENT This condition is usually treated with incision and drainage surgery. Incision and drainage surgery involves making an incision over the abscess to drain the pus. Treatment may also involve antibiotic medicine, pain medicine, stool softeners, or laxatives. HOME CARE INSTRUCTIONS  Take medicines only as directed by your health care provider.  If you were prescribed an antibiotic, finish all of it even if you start to feel better.  To relieve pain, try sitting:  In a warm, shallow bath (sitz bath).  On a heating pad with the setting on  low.  On an inflatable donut-shaped cushion.  Follow any diet instructions as directed by your health care provider.  Keep all follow-up visits as directed by your health care provider. This is important. SEEK MEDICAL CARE IF:  Your abscess is bleeding.  You have pain, swelling, or redness that is getting worse.  You are constipated.  You feel ill.  You have muscle aches or chills.  You have a fever.  Your symptoms return after the abscess has healed.   This information is not intended to replace advice given to you by your health care provider. Make sure you discuss any questions you have with your health care provider.   Document Released: 03/26/2000 Document Revised: 12/18/2014 Document Reviewed: 02/06/2014 Elsevier Interactive Patient Education 2016 [employer].    Hypertension Hypertension, commonly called high blood pressure, is when the force of blood pumping through your arteries is too strong. Your arteries are the blood vessels that carry blood from your heart throughout your body. A blood pressure reading consists of a higher number over a lower number, such as 110/72. The higher number (systolic) is the pressure inside your arteries when your heart pumps. The lower number (diastolic) is the pressure inside your arteries when your heart relaxes. Ideally you want your blood pressure below 120/80. Hypertension forces your heart to work harder to pump blood. Your arteries may become narrow or stiff. Having untreated or uncontrolled hypertension can cause heart attack, stroke, kidney disease, and other problems. RISK FACTORS Some risk factors for high blood pressure are controllable. Others are not.  Risk factors you cannot control include:   Race. You may be at higher risk if you are African American.  Age. Risk increases with age.  Gender. Men are at higher risk than women before age 58 years. After age 49, women are at higher risk than men. Risk factors you can  control include:  Not getting enough exercise or physical activity.  Being overweight.  Getting too much fat, sugar, calories, or salt in your diet.  Drinking too much alcohol. SIGNS AND SYMPTOMS Hypertension does not usually cause signs or symptoms. Extremely high blood pressure (hypertensive crisis) may cause  headache, anxiety, shortness of breath, and nosebleed. DIAGNOSIS To check if you have hypertension, your health care provider will measure your blood pressure while you are seated, with your arm held at the level of your heart. It should be measured at least twice using the same arm. Certain conditions can cause a difference in blood pressure between your right and left arms. A blood pressure reading that is higher than normal on one occasion does not mean that you need treatment. If it is not clear whether you have high blood pressure, you may be asked to return on a different day to have your blood pressure checked again. Or, you may be asked to monitor your blood pressure at home for 1 or more weeks. TREATMENT Treating high blood pressure includes making lifestyle changes and possibly taking medicine. Living a healthy lifestyle can help lower high blood pressure. You may need to change some of your habits. Lifestyle changes may include:  Following the DASH diet. This diet is high in fruits, vegetables, and whole grains. It is low in salt, red meat, and added sugars.  Keep your sodium intake below 2,300 mg per day.  Getting at least 30-45 minutes of aerobic exercise at least 4 times per week.  Losing weight if necessary.  Not smoking.  Limiting alcoholic beverages.  Learning ways to reduce stress. Your health care provider may prescribe medicine if lifestyle changes are not enough to get your blood pressure under control, and if one of the following is true:  You are 46-47 years of age and your systolic blood pressure is above 140.  You are 53 years of age or older, and  your systolic blood pressure is above 150.  Your diastolic blood pressure is above 90.  You have diabetes, and your systolic blood pressure is over [id number] or your diastolic blood pressure is over 90.  You have kidney disease and your blood pressure is above 140/90.  You have heart disease and your blood pressure is above 140/90. Your personal target blood pressure may vary depending on your medical conditions, your age, and other factors. HOME CARE INSTRUCTIONS  Have your blood pressure rechecked as directed by your health care provider.   Take medicines only as directed by your health care provider. Follow the directions carefully. Blood pressure medicines must be taken as prescribed. The medicine does not work as well when you skip doses. Skipping doses also puts you at risk for problems.  Do not smoke.   Monitor your blood pressure at home as directed by your health care provider. SEEK MEDICAL CARE IF:   You think you are having a reaction to medicines taken.  You have recurrent headaches or feel dizzy.  You have swelling in your ankles.  You have trouble with your vision. SEEK IMMEDIATE MEDICAL CARE IF:  You develop a severe headache or confusion.  You have unusual weakness, numbness, or feel faint.  You have severe chest or abdominal pain.  You vomit repeatedly.  You have trouble breathing. MAKE SURE YOU:   Understand these instructions.  Will watch your condition.  Will get help right away if you are not doing well or get worse.   This information is not intended to replace advice given to you by your health care provider. Make sure you discuss any questions you have with your health care provider.   Document Released: 03/29/2005 Document Revised: 08/13/2014 Document Reviewed: 01/19/2013 Elsevier Interactive Patient Education [employer].

## 2015-06-16 ENCOUNTER — Encounter (HOSPITAL_COMMUNITY): Payer: Self-pay | Admitting: Surgery

## 2015-06-17 LAB — CULTURE, ROUTINE-ABSCESS

## 2015-06-18 LAB — ANAEROBIC CULTURE

## 2015-06-20 NOTE — Discharge Summary (Signed)
Roger Martinez Surgery Discharge Summary   Patient ID: Roger Martinez MRN: [id number] DOB/AGE: 09/11/1965 50 y.o.  Admit date: 06/13/2015 Discharge date: 06/20/2015  Admitting Diagnosis: Perianal abscess  Discharge Diagnosis Patient Active Problem List   Diagnosis Date Noted  . Perianal abscess s/p I&D 06/13/2015 06/13/2015  . Perirectal abscess 06/13/2015  . Hypertensive urgency 05/22/2015  . Acute congestive heart failure ([place])   . Acute on chronic combined systolic and diastolic congestive heart failure, NYHA class 3 ([place]) 05/21/2015  . Tobacco abuse disorder 05/07/2015  . Chronic combined systolic (congestive) and diastolic (congestive) heart failure ([place])   . Hypertensive heart disease with heart failure ([place])   . Ischemic cardiomyopathy 06/17/2014  . H/O medication noncompliance 04/05/2013  . CAD, multiple vessel s/p CABG 2011  04/05/2013  . Arteriosclerosis of coronary artery 03/20/2010  . Cannot sleep 03/18/2010  . Accelerated hypertension 03/16/2010  . HLD (hyperlipidemia) 03/13/2010    Consultants Dr. [name] - Cardiology  Imaging: No results found.  Procedures Dr. Johney Maine (06/13/15) - Incision and drainage perirectal abscess, incision and debridement perirectal fistula/scar  Hospital Course:  50 y/o AA male smoker with PMH CAD s/p CABG 2011, Hypertensive heart disease with heart failure, ischemic cardiomyopathy s/p AICD, HLD presented to [facility] with rectal pain and rectal bleeding. States that he noticed some rectal discomofort and itching in his groin on Tuesday night, but Wednesday he had an episode of diarrhea with some blood. Then Thursday, he had hard stools with clotted blood along with severe pain. No drainage of purulence or mucous. Denies any penile discharge or genital swelling/erythema. He is monogamous with his wife, he denies rectal trauma, denies having sex with men, denies concern for STD's or crohns/UC. Describes left sharp anal pain 10/10 upon  arrival to the ED and pain with bowel movements. States it is painful when he walks/sit. No other precipitating/alleviating factors, no radiating pain. Denies fever/chills, N/V, abdominal pain. He's never had anything like this before but does admit to "boils" in his perineal area in the past. No known hemorrhoids. No h/o abdominal surgeries. Admits to marijuana use. CT revealed a small perianal abscess. WBC normal. We were asked to eval for surgical intervention.  Workup showed perianal abscess with rectal bleeding.  Patient was admitted and underwent procedure listed above.  Tolerated procedure well and was transferred to the floor.  Diet was advanced as tolerated.    On POD #1, was feeling well and wanted to go home.   His cardiologist Dr [name] advised him to stay another day to continue to monitor him, but he decided to leave AMA.  He was advised about the risks of leaving the hospital before doctors recommendations.  Patient will follow up in our office in 2 weeks and knows to call with questions or concerns.  He should call confirm appointment date/time.  He was recommended to take Augmentin at discharge for 5 additional days.  He was to resume all home meds, although he apparently only takes them every 3 days "due to costs".       Medication List    TAKE these medications        amoxicillin-clavulanate 875-125 MG tablet  Commonly known as:  AUGMENTIN  Take 1 tablet by mouth 2 (two) times daily.     aspirin EC 81 MG tablet  Take 81 mg by mouth every 3 (three) days.     carvedilol 25 MG tablet  Commonly known as:  COREG  Take 1 tablet (25 mg total) by  mouth 2 (two) times daily with a meal.     furosemide 40 MG tablet  Commonly known as:  LASIX  Take 1 tablet (40 mg total) by mouth daily.     hydrALAZINE 25 MG tablet  Commonly known as:  APRESOLINE  Take 1 tablet (25 mg total) by mouth 4 (four) times daily.     lisinopril 20 MG tablet  Commonly known as:   PRINIVIL,ZESTRIL  Take 1 tablet (20 mg total) by mouth daily.     oxyCODONE 5 MG immediate release tablet  Commonly known as:  Oxy IR/ROXICODONE  Take 1-2 tablets (5-10 mg total) by mouth every 4 (four) hours as needed for moderate pain or severe pain.     oxyCODONE-acetaminophen 5-325 MG tablet  Commonly known as:  PERCOCET/ROXICET  Take 1-2 tablets by mouth every 4 (four) hours as needed for moderate pain.     pantoprazole 40 MG tablet  Commonly known as:  PROTONIX  Take 1 tablet (40 mg total) by mouth daily.     potassium chloride SA 20 MEQ tablet  Commonly known as:  K-DUR,KLOR-CON  Take 20 mEq by mouth every 3 (three) days.            Follow-up Information    Follow up with GROSS,STEVEN C., MD. Schedule an appointment as soon as possible for a visit in 2 weeks.   Specialty:  General Surgery   Why:  To follow up after your operation, To follow up after your hospital stay   Contact information:   [place] [id number] [phone]       Schedule an appointment as soon as possible for a visit with Candee Furbish, MD.   Specialty:  Cardiology   Why:  The office will call with the follow up appt   Contact information:   [address] [address] [phone]       Signed: Nat Christen, [facility] Surgery [phone]  06/20/2015, 12:05 PM   Agree with above. Note:  I talked to him the day her left and explained why I thought staying in the hospital the recommendations of cardiology made sense.   He has been non compliant on some of his treatment and he did not want to stay another day for BP control.  His non-compliance continues to be an issue.  But this is his decision.  Alphonsa Overall, MD, [facility] Surgery Pager: [phone] Office phone:  [phone]

## 2015-06-23 ENCOUNTER — Encounter: Payer: Self-pay | Admitting: Nurse Practitioner

## 2015-06-23 ENCOUNTER — Ambulatory Visit (INDEPENDENT_AMBULATORY_CARE_PROVIDER_SITE_OTHER): Payer: PRIVATE HEALTH INSURANCE | Admitting: Nurse Practitioner

## 2015-06-23 VITALS — BP 200/120 | HR 72 | Ht 71.0 in | Wt 199.4 lb

## 2015-06-23 DIAGNOSIS — I5022 Chronic systolic (congestive) heart failure: Secondary | ICD-10-CM

## 2015-06-23 DIAGNOSIS — I10 Essential (primary) hypertension: Secondary | ICD-10-CM | POA: Diagnosis not present

## 2015-06-23 DIAGNOSIS — I110 Hypertensive heart disease with heart failure: Secondary | ICD-10-CM | POA: Diagnosis not present

## 2015-06-23 DIAGNOSIS — I255 Ischemic cardiomyopathy: Secondary | ICD-10-CM | POA: Diagnosis not present

## 2015-06-23 LAB — BASIC METABOLIC PANEL
BUN: 11 mg/dL (ref 7–25)
CO2: 29 mmol/L (ref 20–31)
Calcium: 9.7 mg/dL (ref 8.6–10.3)
Chloride: 103 mmol/L (ref 98–110)
Creat: 0.93 mg/dL (ref 0.60–1.35)
Glucose, Bld: 84 mg/dL (ref 65–99)
Potassium: 3.9 mmol/L (ref 3.5–5.3)
Sodium: 140 mmol/L (ref 135–146)

## 2015-06-23 MED ORDER — CARVEDILOL 25 MG PO TABS: 25.0000 mg | ORAL_TABLET | Freq: Two times a day (BID) | ORAL | Status: DC

## 2015-06-23 MED ORDER — CLONIDINE HCL 0.1 MG PO TABS
0.1000 mg | ORAL_TABLET | ORAL | Status: AC
  Administered 2015-06-23: 0.1 mg via ORAL

## 2015-06-23 MED ORDER — FUROSEMIDE 40 MG PO TABS: 40.0000 mg | ORAL_TABLET | Freq: Every day | ORAL | Status: DC

## 2015-06-23 NOTE — Addendum Note (Signed)
Addended by: Burtis Junes on: 06/23/2015 08:52 PM   Modules accepted: Miquel Dunn

## 2015-06-23 NOTE — Patient Instructions (Addendum)
We will be checking the following labs today - BMET   Medication Instructions:    Follow this list of medicines - take every day.    Testing/Procedures To Be Arranged:  N/A  Follow-Up:   FLEX OV on Wednesday    Other Special Instructions:   N/A    If you need a refill on your cardiac medications before your next appointment, please call your pharmacy.   Call the [place] office at [phone] if you have any questions, problems or concerns.

## 2015-06-23 NOTE — Progress Notes (Addendum)
CARDIOLOGY OFFICE NOTE  Date:  06/23/2015    Roger Martinez Date of Birth: December 02, 1965 Medical Record [id number]  PCP:  No primary care provider on file.  Cardiologist:  [name]    Chief Complaint  Patient presents with  . Hyperlipidemia  . Congestive Heart Failure  . Cardiomyopathy  . Coronary Artery Disease    Follow up visit - seen for Dr. [name]    History of Present Illness: Roger Martinez is a 50 y.o. male who presents today for a one month check. Seen for Dr. [name].   He has a history of CAD with prior three-vessel bypass in 2011 and hypertension. He has had ongoing compliance issues.    Patient previously left AMA from hospital on 04/07/13 with new diagnosis of systolic heart failure with ejection fraction of 30-35%. Echo has been repeated and was 20- 25 percent. Because of this, defibrillator was placed back in 2016 - followed by Dr. Lovena Le. Has had tendency to not take his medicines on a daily basis.   Admitted last month for accelerated HTN with associated heart failure and noncompliance. Troponin mildly elevated consistent with demand ischemia, 0.14. Seen last month for his TOC visit - felt to be doing ok. Lots of education given regarding need for salt restriction, etc.   Was in the hospital earlier this month and had to have I & D of a perirectal abscess.   Comes back today. Here alone today. He tells me that he continues to take all his medicines only every 2 to 3 days. His bottom is hurting. Pain medicine is upsetting his stomach. No NSAID use reported. No insurance. Still smoking. No real chest pain or shortness of breath. Lots of stress and very angry about his current financial situation. Says he feels "fine". He only took 2 doses of the Hydralazine and stopped because it made him feel like he was "on fire".   Past Medical History  Diagnosis Date  . Coronary artery disease     a. 2011 s/p CABG x 3.  . Hypertensive heart disease with heart failure ([place])   .  Chronic combined systolic (congestive) and diastolic (congestive) heart failure ([place])     a. 03/2014 Echo: EF 20-25%, diff HK, Gr 2 DD, mild AI, sev dil LA, mod reduced RV fxn, PASP 50mmHg.  . Ischemic cardiomyopathy     a. 03/2014 Echo: EF 20-25%;  b. 06/2014 s/p BSX single lead AICD, ser # [id number].  Marland Kitchen Anxiety state, unspecified   . MI (myocardial infarction) ([place]) 03/2011  . STD (sexually transmitted disease)   . Hyperlipidemia   . Tobacco abuse   . Noncompliance   . Marijuana abuse     Past Surgical History  Procedure Laterality Date  . Bypass graft  2011    3 vessel  . Ep implantable device  06/17/2014    DR [name]  . Implantable cardioverter defibrillator implant N/A 06/17/2014    Procedure: IMPLANTABLE CARDIOVERTER DEFIBRILLATOR IMPLANT;  Surgeon: Deboraha Sprang, MD;  Location: [facility] CATH LAB;  Service: Cardiovascular;  Laterality: N/A;  . Incision and drainage perirectal abscess N/A 06/13/2015    Procedure: IRRIGATION AND DEBRIDEMENT PERIRECTAL ABSCESS;  Surgeon: Michael Boston, MD;  Location: [facility] ORS;  Service: General;  Laterality: N/A;     Medications: Current Outpatient Prescriptions  Medication Sig Dispense Refill  . aspirin EC 81 MG tablet Take 81 mg by mouth every 3 (three) days.     . carvedilol (COREG) 25 MG tablet Take  1 tablet (25 mg total) by mouth 2 (two) times daily with a meal. (Patient taking differently: Take 25 mg by mouth every 3 (three) days. ) 60 tablet 3  . furosemide (LASIX) 40 MG tablet Take 1 tablet (40 mg total) by mouth daily. (Patient taking differently: Take 40 mg by mouth every 3 (three) days. ) 30 tablet 3  . lisinopril (PRINIVIL,ZESTRIL) 20 MG tablet Take 1 tablet (20 mg total) by mouth daily. 30 tablet 3  . pantoprazole (PROTONIX) 40 MG tablet Take 1 tablet (40 mg total) by mouth daily. (Patient taking differently: Take 40 mg by mouth every 3 (three) days. ) 30 tablet 0  . potassium chloride SA (K-DUR,KLOR-CON) 20 MEQ tablet Take 20 mEq by mouth every 3  (three) days.      No current facility-administered medications for this visit.    Allergies: No Known Allergies  Social History: The patient  reports that he has quit smoking. He has never used smokeless tobacco. He reports that he uses illicit drugs (Marijuana). He reports that he does not drink alcohol.   Family History: The patient's family history includes Diabetes in his mother; Hypertension in his mother; Other in his father.   Review of Systems: Please see the history of present illness.   Otherwise, the review of systems is positive for none.   All other systems are reviewed and negative.   Physical Exam: VS:  BP 200/120 mmHg  Pulse 72  Ht 5\' 11"  (1.803 m)  Wt 199 lb 6.4 oz (90.447 kg)  BMI 27.82 kg/m2 .  BMI Body mass index is 27.82 kg/(m^2).  Wt Readings from Last 3 Encounters:  06/23/15 199 lb 6.4 oz (90.447 kg)  06/14/15 195 lb 9.6 oz (88.724 kg)  05/30/15 194 lb 12.8 oz (88.361 kg)   BP is 220/120 by me and then 230/130.   General: Angry. He is alert. He is raising his voice at times. He is in no acute distress. HEENT: Normal. Neck: Supple, no JVD, carotid bruits, or masses noted.  Cardiac: Regular rate and rhythm. No murmurs, rubs, or gallops. No edema.  Respiratory:  Lungs are clear to auscultation bilaterally with normal work of breathing.  GI: Soft and nontender.  MS: No deformity or atrophy. Gait and ROM intact. Skin: Warm and dry. Color is normal.  Neuro:  Strength and sensation are intact and no gross focal deficits noted.  Psych: Alert, appropriate and with normal affect.   LABORATORY DATA:  EKG:  EKG is not ordered today.   Lab Results  Component Value Date   WBC 14.7* 06/14/2015   HGB 12.7* 06/14/2015   HCT 38.9* 06/14/2015   PLT 221 06/14/2015   GLUCOSE 159* 06/14/2015   CHOL 171 04/06/2013   TRIG 34 04/06/2013   HDL 46 04/06/2013   LDLCALC 118* 04/06/2013   ALT 10* 06/13/2015   AST 25 06/13/2015   NA 141 06/14/2015   K 4.3  06/14/2015   CL 108 06/14/2015   CREATININE 1.17 06/14/2015   BUN 15 06/14/2015   CO2 24 06/14/2015   HGBA1C 5.9 05/06/2015    BNP (last 3 results)  Recent Labs  05/21/15 1457  BNP 3492.5*    ProBNP (last 3 results) No results for input(s): PROBNP in the last 8760 hours.   Other Studies Reviewed Today:  Echocardiogram: 05/08/15- Left ventricle: The cavity size was normal. Wall thickness was increased in a pattern of severe LVH. Systolic function was moderately to severely reduced. The estimated ejection  fraction was in the range of 20-25%. Diffuse hypokinesis. - Aortic valve: Mild, moderate regurgitation. - Mitral valve: Mild regurgitation. - Left atrium: The atrium was moderately dilated. - Right atrium: The atrium was mildly dilated. - Pulmonary arteries: PA peak pressure: 18mm Hg (S).  ASSESSMENT AND PLAN:   1. Cardiomyopathy/chronic systolic heart failure-EF 25%-30. Admitted December 2014 as well as February 2017 with acute systolic heart failure. Defibrillator in place. Continued noncompliance. Not really clear to me about the way he is taking medicines - says he has "plenty". BP markedly elevated at today's visit.  2. Coronary artery disease-post bypass/CABG in [place] back in 2011. No active anginal symptoms - would favor continued CV risk factor modification.  3. Essential hypertension- he is NOT taking his cardiac medicines as prescribed. Not clear to me as to why since he says he has them. Worried about starting all back at one time - but BP very high today. Gave one dose of Clonidine 0.1 mg here in the office today. Did not tolerate hydralazine (but only took 2 doses) and I do not think he will take. Restarting beta blocker, ACE and diuretic - EVERY DAY. FLEX visit on Wednesday or Thursday  4. Nonsustained ventricular tachycardia noted during past hospitalization. Beta blocker prescribed but not taking regularly. ICD in place 5. Hyperlipidemia -  no longer on statin - ?of myalgias in the past - would get BP treated and then discuss this.  6. Perirectal abscess that has required recent I & D. Plan per general surgery.  7. Tobacco abuse - he is not ready to stop.     Current medicines are reviewed with the patient today.  The patient does not have concerns regarding medicines other than what has been noted above.  The following changes have been made:  See above.  Labs/ tests ordered today include:    Orders Placed This Encounter  Procedures  . Basic metabolic panel     Disposition:   FU in the FLEX on Wednesday or Thursday.    Patient is agreeable to this plan and will call if any problems develop in the interim.   Signed: Burtis Junes, RN, ANP-C 06/23/2015 2:03 PM  [address] [place], [place]  [address] Phone: [phone] Fax: [phone]        Addendum:  Patient was kept in the office for approximately 1 1/2 hours following administration of Clonidine. BP 210/120. Very little improvement but he was adamant about leaving. Told me he had "plenty" of medicines at home. I have asked him to take Coreg, Lisinopril and Lasix today and then each day as prescribed. I suspect BP has been quite elevated for some time. Not clear to me why he does not take his medicines as prescribed despite having them. Overall situation quite tenuous at best. He is to come back later this week for recheck.   Burtis Junes, RN, [facility] [address] [place], [place]  [address] [phone]

## 2015-06-25 ENCOUNTER — Encounter: Payer: Self-pay | Admitting: Cardiology

## 2015-06-25 NOTE — Progress Notes (Signed)
Cardiology Office Note:    Date:  06/26/2015   ID:  Roger Martinez, DOB 01-26-1966, MRN [id number]  PCP:  No primary care provider on file.  Cardiologist:  Dr. Candee Furbish   Electrophysiologist:  Dr. Virl Martinez   Chief Complaint  Patient presents with  . Follow-up    HTN    History of Present Illness:     Roger Martinez is a 50 y.o. male with a hx of CAD status post CABG, dilated ischemic cardiomyopathy, combined systolic and diastolic CHF, hypertensive heart disease, HL, tobacco abuse. Patient was previously cared for in [place], [place]. He underwent angioplasty of the OM branch and subsequent non-STEMI. LHC demonstrated severe 3 vessel CAD and he underwent CABG (LIMA-LAD, SVG-OM2, free radial-PDA). He has known cardiomyopathy with EF 35% at the time of his bypass. He was previously a patient of Dr. [name]. He is now followed by Dr. [name] and Dr. [name]. Ejection fraction has remained less than 35%. He underwent ICD implantation in 3/16.  Admitted 2/8-2/10 with acute on chronic combined systolic and diastolic CHF. Patient was not taking his medications correctly. He had minimally elevated troponin levels felt to be related to demand ischemia  Last seen by Dr. [name] 05/30/15. Blood pressure is uncontrolled. He was not taking medications correctly. He was drinking significant amounts of Gatorade (increased salt load). Compliance was encouraged.  Last seen in this office by [name], NP on 06/23/15.  Patient's blood pressure was markedly elevated. He was only taking his medications every 2-3 days. He reported side effects with hydralazine and stop the medication. He was given 1 dose of clonidine. He was placed back on his beta blocker, ACE inhibitor and diuretic. Early follow-up was recommended.  Returns for follow-up. Here alone. Overall, doing well. Blood pressure today markedly improved. He notes he was in significant amounts of pain when he was seen a few days ago. This is improving  as well. The patient denies any chest pain, significant dyspnea, syncope, orthopnea, PND, edema.   Past Medical History  Diagnosis Date  . Coronary artery disease     a. 2011 s/p CABG x 3.  . Hypertensive heart disease with heart failure ([place])   . Chronic combined systolic (congestive) and diastolic (congestive) heart failure ([place])     a. 03/2014 Echo: EF 20-25%, diff HK, Gr 2 DD, mild AI, sev dil LA, mod reduced RV fxn, PASP 71mmHg.  . Ischemic cardiomyopathy     a. 03/2014 Echo: EF 20-25%;  b. 06/2014 s/p BSX single lead AICD, ser # [id number].  Marland Kitchen Anxiety state, unspecified   . MI (myocardial infarction) ([place]) 03/2011  . STD (sexually transmitted disease)   . Hyperlipidemia   . Tobacco abuse   . Noncompliance   . Marijuana abuse     Past Surgical History  Procedure Laterality Date  . Bypass graft  2011    3 vessel  . Ep implantable device  06/17/2014    DR [name]  . Implantable cardioverter defibrillator implant N/A 06/17/2014    Procedure: IMPLANTABLE CARDIOVERTER DEFIBRILLATOR IMPLANT;  Surgeon: Deboraha Sprang, MD;  Location: [facility] CATH LAB;  Service: Cardiovascular;  Laterality: N/A;  . Incision and drainage perirectal abscess N/A 06/13/2015    Procedure: IRRIGATION AND DEBRIDEMENT PERIRECTAL ABSCESS;  Surgeon: Michael Boston, MD;  Location: [facility] ORS;  Service: General;  Laterality: N/A;    Current Medications: Outpatient Prescriptions Prior to Visit  Medication Sig Dispense Refill  . aspirin EC 81 MG tablet Take 81 mg  by mouth every 3 (three) days.     . carvedilol (COREG) 25 MG tablet Take 1 tablet (25 mg total) by mouth 2 (two) times daily with a meal. 60 tablet 3  . furosemide (LASIX) 40 MG tablet Take 1 tablet (40 mg total) by mouth daily. 30 tablet 3  . lisinopril (PRINIVIL,ZESTRIL) 20 MG tablet Take 1 tablet (20 mg total) by mouth daily. 30 tablet 3  . potassium chloride SA (K-DUR,KLOR-CON) 20 MEQ tablet Take 20 mEq by mouth daily.    . pantoprazole (PROTONIX) 40 MG tablet Take 1  tablet (40 mg total) by mouth daily. (Patient taking differently: Take 40 mg by mouth every 3 (three) days. ) 30 tablet 0   No facility-administered medications prior to visit.     Allergies:   Review of patient's allergies indicates no known allergies.   Social History   Social History  . Marital Status: Legally Separated    Spouse Name: N/A  . Number of Children: N/A  . Years of Education: N/A   Social History Main Topics  . Smoking status: Former Smoker -- 0.00 packs/day for 20 years  . Smokeless tobacco: Never Used  . Alcohol Use: No  . Drug Use: Yes    Special: Marijuana     Comment: none for 2 weeks  . Sexual Activity: Not Asked   Other Topics Concern  . None   Social History Narrative   Lives in [place] with wife.  Does not routinely exercise.  Unemployed.     Family History:  The patient's family history includes Diabetes in his mother; Hypertension in his mother; Other in his father.   ROS:   Please see the history of present illness.    ROS All other systems reviewed and are negative.   Physical Exam:    VS:  BP 130/82 mmHg  Pulse 58  Ht 5\' 11"  (1.803 m)  Wt 196 lb (88.905 kg)  BMI 27.35 kg/m2   GEN: Well nourished, well developed, in no acute distress HEENT: normal Neck: no JVD, no masses Cardiac: Normal S1/S2, RRR; no murmurs, rubs, or gallops, no edema;     Respiratory:  clear to auscultation bilaterally; no wheezing, rhonchi or rales GI: soft, nontender, nondistended MS: no deformity or atrophy Skin: warm and dry Neuro: No focal deficits  Psych: Alert and oriented x 3, normal affect  Wt Readings from Last 3 Encounters:  06/26/15 196 lb (88.905 kg)  06/23/15 199 lb 6.4 oz (90.447 kg)  06/14/15 195 lb 9.6 oz (88.724 kg)      Studies/Labs Reviewed:     EKG:  EKG is  ordered today.  The ekg ordered today demonstrates Sinus bradycardia, HR 58, rightward axis, T-wave inversions in 1, 2, 3, aVF, V2-V6, LVH, QTc 508 ms, no change from prior  tracings  Recent Labs: 05/21/2015: B Natriuretic Peptide 3492.5* 05/23/2015: Magnesium 2.0 06/13/2015: ALT 10* 06/14/2015: Hemoglobin 12.7*; Platelets 221 06/23/2015: BUN 11; Creat 0.93; Potassium 3.9; Sodium 140   Recent Lipid Panel    Component Value Date/Time   CHOL 171 04/06/2013 0202   TRIG 34 04/06/2013 0202   HDL 46 04/06/2013 0202   CHOLHDL 3.7 04/06/2013 0202   VLDL 7 04/06/2013 0202   LDLCALC 118* 04/06/2013 0202    Additional studies/ records that were reviewed today include:   Echo 05/08/15 Severe concentric LVH greater at the septum, EF 25-30%, diffuse HK, grade 2 diastolic dysfunction, mild to moderate AI, mild MR, severe LAE, mildly reduced RVSF,  mild RAE increased left atrial pressure, moderate TR, PASP 33 mmHg, GLS -7%  ASSESSMENT:     1. Hypertensive heart disease with heart failure ([place])   2. Coronary artery disease involving native coronary artery of native heart without angina pectoris   3. Chronic combined systolic and diastolic CHF (congestive heart failure) ([facility])   4. Cardiomyopathy, ischemic   5. Dyslipidemia   6. Implantable defibrillator reprogramming/check   7. Tobacco abuse disorder     PLAN:     In order of problems listed above:  1. HTN Heart Disease -  Blood pressure improved. He tells me that he only takes his medications every other day secondary to cost.  We discussed getting medications at [facility] or [facility].  He gets a change in his insurance in April.  I have advised him of the importance of taking medications on a daily basis.  He can look into [facility].  His wife has looked into this.    2. CAD -status post prior CABG in [place], [place]. No angina.  Continue ASA.  He was intol of Simvastatin.  Could try Pravastatin.  Will wait until he can afford to be on another medication.   3. Chronic Combined Systolic and Diastolic CHF - Volume stable.  Recent labs ok on 3/13 (K 3.9, Cr 0.93).    4. Ischemic CM - EF 25-30%  by echo 1/17.  Continue beta-blocker, ACE inhibitor.  Eventually try to get on Spiro 25 QD when he can afford to add more medications. He is intol of Hydralazine.  He takes PDE-5 inhibitors and cannot take nitrates.   5. HL - Eventually try to start Prava 20 qhs.   6. S/p ICD - FU with EP as planned.   7. Tobacco abuse - He just quit smoking 2 days ago.    Medication Adjustments/Labs and Tests Ordered: Current medicines are reviewed at length with the patient today.  Concerns regarding medicines are outlined above.  Medication changes, Labs and Tests ordered today are outlined in the Patient Instructions noted below. Patient Instructions  Medication Instructions:  Your physician recommends that you continue on your current medications as directed. Please refer to the Current Medication list given to you today.  Labwork: NONE  Testing/Procedures: NONE  Follow-Up: 08/07/15 @ 8:30 WITH Zohair Epp, PAC   Any Other Special Instructions Will Be Listed Below (If Applicable).  If you need a refill on your cardiac medications before your next appointment, please call your pharmacy.    Signed, Richardson Dopp, PA-C  06/26/2015 9:25 AM    [facility] [place], [place], [place]  [address] Phone: [phone]; Fax: [phone]

## 2015-06-26 ENCOUNTER — Ambulatory Visit (INDEPENDENT_AMBULATORY_CARE_PROVIDER_SITE_OTHER): Payer: PRIVATE HEALTH INSURANCE | Admitting: Physician Assistant

## 2015-06-26 ENCOUNTER — Encounter: Payer: Self-pay | Admitting: Physician Assistant

## 2015-06-26 VITALS — BP 130/82 | HR 58 | Ht 71.0 in | Wt 196.0 lb

## 2015-06-26 DIAGNOSIS — I5042 Chronic combined systolic (congestive) and diastolic (congestive) heart failure: Secondary | ICD-10-CM | POA: Diagnosis not present

## 2015-06-26 DIAGNOSIS — I2510 Atherosclerotic heart disease of native coronary artery without angina pectoris: Secondary | ICD-10-CM

## 2015-06-26 DIAGNOSIS — Z720 Tobacco use: Secondary | ICD-10-CM

## 2015-06-26 DIAGNOSIS — Z4502 Encounter for adjustment and management of automatic implantable cardiac defibrillator: Secondary | ICD-10-CM

## 2015-06-26 DIAGNOSIS — I255 Ischemic cardiomyopathy: Secondary | ICD-10-CM

## 2015-06-26 DIAGNOSIS — I110 Hypertensive heart disease with heart failure: Secondary | ICD-10-CM | POA: Diagnosis not present

## 2015-06-26 DIAGNOSIS — E785 Hyperlipidemia, unspecified: Secondary | ICD-10-CM

## 2015-06-26 NOTE — Patient Instructions (Addendum)
Medication Instructions:  Your physician recommends that you continue on your current medications as directed. Please refer to the Current Medication list given to you today.  Labwork: NONE  Testing/Procedures: NONE  Follow-Up: 08/07/15 @ 8:30 WITH SCOTT WEAVER, PAC   Any Other Special Instructions Will Be Listed Below (If Applicable).  If you need a refill on your cardiac medications before your next appointment, please call your pharmacy.

## 2015-07-22 ENCOUNTER — Telehealth: Payer: Self-pay | Admitting: Cardiology

## 2015-07-22 NOTE — Telephone Encounter (Signed)
LMTCB

## 2015-07-22 NOTE — Telephone Encounter (Signed)
Pt wants to verify meds-he was taking 8 and now he has nine-pls call [phone]

## 2015-07-23 NOTE — Telephone Encounter (Signed)
Spoke with pt who was confused about his medications and what he should be taking.  Reviewed medications ordered against the medication bottles he has at home.  He thanked me for the call and taking time to review his meds with him.  He states understanding of the medications he should be taking.

## 2015-08-06 NOTE — Progress Notes (Signed)
Cardiology Office Note:    Date:  08/07/2015   ID:  Roger Martinez, DOB February 05, 1966, MRN [id number]  PCP:  No primary care provider on file.  Cardiologist:  Dr. Candee Furbish   Electrophysiologist:  Dr. Virl Axe   Chief Complaint  Patient presents with  . Follow-up    HTN    History of Present Illness:     Roger Martinez is a 50 y.o. male with a hx of CAD status post CABG, dilated ischemic cardiomyopathy, combined systolic and diastolic CHF, hypertensive heart disease, HL, tobacco abuse. Patient was previously cared for in [place], [place]. He underwent angioplasty of the OM branch and subsequent non-STEMI. LHC demonstrated severe 3 vessel CAD and he underwent CABG (LIMA-LAD, SVG-OM2, free radial-PDA). He has known cardiomyopathy with EF 35% at the time of his bypass. He was previously a patient of Dr. [name]. He is now followed by Dr. [name] and Dr. [name]. Ejection fraction has remained less than 35%. He underwent ICD implantation in 3/16.  Admitted 2/16 with a/c combined systolic and diastolic HF. Patient was not taking his medications correctly. He had minimally elevated troponin levels felt to be related to demand ischemia  Patient has been seen recently in the office with markedly elevated blood pressures. He was not taking his medications on a consistent basis. He was doing this secondary to cost. When last seen by me on 3/16, blood pressure was controlled.  Returns for follow-up.  His BP is markedly elevated today.  He is still not taking medications correctly.  At first he tells me he is taking his meds.  He admitted to skipping his meds today b/c he has not eaten.  But, after further questioning, he admits to not taking meds every day.  He has not looked into [facility] yet to help with medications etc.  Denies HAs.  Exercises daily.  The patient denies chest pain, shortness of breath, syncope, orthopnea, PND or significant pedal edema.    Past Medical History    Diagnosis Date  . Coronary artery disease     a. 2011 s/p CABG x 3.  . Hypertensive heart disease with heart failure ([place])   . Chronic combined systolic (congestive) and diastolic (congestive) heart failure ([place])     a. 03/2014 Echo: EF 20-25%, diff HK, Gr 2 DD, mild AI, sev dil LA, mod reduced RV fxn, PASP 52mmHg.  . Ischemic cardiomyopathy     a. 03/2014 Echo: EF 20-25%;  b. 06/2014 s/p BSX single lead AICD, ser # [id number].  Marland Kitchen Anxiety state, unspecified   . MI (myocardial infarction) ([place]) 03/2011  . STD (sexually transmitted disease)   . Hyperlipidemia   . Tobacco abuse   . Noncompliance   . Marijuana abuse     Past Surgical History  Procedure Laterality Date  . Bypass graft  2011    3 vessel  . Ep implantable device  06/17/2014    DR [name]  . Implantable cardioverter defibrillator implant N/A 06/17/2014    Procedure: IMPLANTABLE CARDIOVERTER DEFIBRILLATOR IMPLANT;  Surgeon: Deboraha Sprang, MD;  Location: [facility] CATH LAB;  Service: Cardiovascular;  Laterality: N/A;  . Incision and drainage perirectal abscess N/A 06/13/2015    Procedure: IRRIGATION AND DEBRIDEMENT PERIRECTAL ABSCESS;  Surgeon: Michael Boston, MD;  Location: [facility] ORS;  Service: General;  Laterality: N/A;    Current Medications: Outpatient Prescriptions Prior to Visit  Medication Sig Dispense Refill  . aspirin EC 81 MG tablet Take 81 mg by mouth every  3 (three) days.     . carvedilol (COREG) 25 MG tablet Take 1 tablet (25 mg total) by mouth 2 (two) times daily with a meal. 60 tablet 3  . furosemide (LASIX) 40 MG tablet Take 1 tablet (40 mg total) by mouth daily. 30 tablet 3  . hydrALAZINE (APRESOLINE) 50 MG tablet Take 50 mg by mouth 3 (three) times daily.    Marland Kitchen lisinopril (PRINIVIL,ZESTRIL) 20 MG tablet Take 1 tablet (20 mg total) by mouth daily. 30 tablet 3  . potassium chloride SA (K-DUR,KLOR-CON) 20 MEQ tablet Take 20 mEq by mouth daily.    . pantoprazole (PROTONIX) 40 MG tablet Take 40 mg by mouth every 3 (three) days.      No facility-administered medications prior to visit.     Allergies:   Review of patient's allergies indicates no known allergies.   Social History   Social History  . Marital Status: Legally Separated    Spouse Name: N/A  . Number of Children: N/A  . Years of Education: N/A   Social History Main Topics  . Smoking status: Former Smoker -- 0.00 packs/day for 20 years  . Smokeless tobacco: Never Used  . Alcohol Use: No  . Drug Use: Yes    Special: Marijuana     Comment: none for 2 weeks  . Sexual Activity: Not Asked   Other Topics Concern  . None   Social History Narrative   Lives in [place] with wife.  Does not routinely exercise.  Unemployed.     Family History:  The patient's family history includes Diabetes in his mother; Hypertension in his mother; Other in his father.   ROS:   Please see the history of present illness.    ROS All other systems reviewed and are negative.   Physical Exam:    VS:  BP 180/100 mmHg  Pulse 72  Ht 5\' 11"  (1.803 m)  Wt 197 lb 12.8 oz (89.721 kg)  BMI 27.60 kg/m2   GEN: Well nourished, well developed, in no acute distress HEENT: normal Neck: no JVD, no masses Cardiac: Normal S1/S2, RRR; no murmurs, rubs, or gallops, no edema;     Respiratory:  clear to auscultation bilaterally; no wheezing, rhonchi or rales GI: soft, nontender, nondistended MS: no deformity or atrophy Skin: warm and dry Neuro: No focal deficits  Psych: Alert and oriented x 3, normal affect  Wt Readings from Last 3 Encounters:  08/07/15 197 lb 12.8 oz (89.721 kg)  06/26/15 196 lb (88.905 kg)  06/23/15 199 lb 6.4 oz (90.447 kg)      Studies/Labs Reviewed:     EKG:  EKG is not  ordered today.  The ekg ordered today demonstrates n/a  Recent Labs: 05/21/2015: B Natriuretic Peptide 3492.5* 05/23/2015: Magnesium 2.0 06/13/2015: ALT 10* 06/14/2015: Hemoglobin 12.7*; Platelets 221 06/23/2015: BUN 11; Creat 0.93; Potassium 3.9; Sodium 140   Recent Lipid  Panel    Component Value Date/Time   CHOL 171 04/06/2013 0202   TRIG 34 04/06/2013 0202   HDL 46 04/06/2013 0202   CHOLHDL 3.7 04/06/2013 0202   VLDL 7 04/06/2013 0202   LDLCALC 118* 04/06/2013 0202    Additional studies/ records that were reviewed today include:   Echo 05/08/15 Severe concentric LVH greater at the septum, EF 25-30%, diffuse HK, grade 2 diastolic dysfunction, mild to moderate AI, mild MR, severe LAE, mildly reduced RVSF, mild RAE increased left atrial pressure, moderate TR, PASP 33 mmHg, GLS -7%  ASSESSMENT:  1. Hypertensive heart disease with heart failure ([place])   2. Coronary artery disease involving native coronary artery of native heart without angina pectoris   3. Chronic combined systolic and diastolic CHF (congestive heart failure) ([facility])   4. Cardiomyopathy, ischemic   5. Dyslipidemia     PLAN:     In order of problems listed above:  1. HTN Heart Disease -  BP uncontrolled.  Long d/w patient today regarding the importance of taking his medications as prescribed.  We discussed the difficulty in adjusting his meds appropriately when he does not take them on a daily basis.  He notes difficulty in affording medications and in affording meals.  Wife still works so he has not qualified for assistance with some more expensive medications.  We discussed the risk or MI, progressive CAD, acute CHF, CVA and death as it relates to poorly treated HTN.  I have rec he look in to going to the [facility] to see if meds at their pharmacy are cheaper.  BP at last visit was good.  But, he had taken his meds that day.  BP at home ranges 140s/90s.  At this point I am not sure how to adjust his meds.  I shared this with him.    -  Rec that he take all meds every day as prescribed  -  Check BP daily and record  -  Return in 2 weeks with record of BP and BP machine >> adjust meds as needed (Increase Lisinopril)  2. CAD - s/p prior CABG in [place], [place]. No angina.   Continue ASA.  He was intol of Simvastatin.  Could try Pravastatin.  But, as noted, he is not taking his meds correctly.  He gets overwhelmed when I discuss increasing/changing meds.  If he can establish with [facility] clinic, will add statin.   3. Chronic Combined Systolic and Diastolic CHF - Volume stable.  Volume stable.  4. Ischemic CM - EF 25-30% by echo 1/17.  Continue beta-blocker, ACE inhibitor.  Records indicate that he has a prior intolerance to spironolactone. Previously he told be that he was intolerant to hydralazine. However, he is currently taking hydralazine. He takes PDE-5 inhibitors and cannot take nitrates.  Records indicate that he was intolerant to [facility].  5. HL - Eventually try to start Prava 20 qhs.   Medication Adjustments/Labs and Tests Ordered: Current medicines are reviewed at length with the patient today.  Concerns regarding medicines are outlined above.  Medication changes, Labs and Tests ordered today are outlined in the Patient Instructions noted below. Patient Instructions  Medication Instructions:  Your physician recommends that you continue on your current medications as directed. Please refer to the Current Medication list given to you today. It is important to take your medications on a daily basis to control your blood pressure. Labwork: None ordered Testing/Procedures: None ordered Follow-Up: Your physician recommends that you schedule a follow-up appointment in: [place], PA-C Any Other Special Instructions Will Be Listed Below (If Applicable). Your physician has requested that you regularly monitor and record your blood pressure readings at home. Please use the same machine at the same time of day to check your readings and record them to bring to your follow-up visit. BRING YOUR BLOOD PRESSURE MACHINE WITH YOU TO [place] #: [phone] If you need a refill on your cardiac medications before  your next appointment, please call your pharmacy.  Signed, Richardson Dopp, PA-C  08/07/2015 9:08 AM    [facility] [place], [place], [place]  [address] Phone: [phone]; Fax: [phone]

## 2015-08-07 ENCOUNTER — Encounter: Payer: Self-pay | Admitting: Physician Assistant

## 2015-08-07 ENCOUNTER — Ambulatory Visit (INDEPENDENT_AMBULATORY_CARE_PROVIDER_SITE_OTHER): Payer: PRIVATE HEALTH INSURANCE | Admitting: Physician Assistant

## 2015-08-07 VITALS — BP 180/100 | HR 72 | Ht 71.0 in | Wt 197.8 lb

## 2015-08-07 DIAGNOSIS — I5042 Chronic combined systolic (congestive) and diastolic (congestive) heart failure: Secondary | ICD-10-CM

## 2015-08-07 DIAGNOSIS — E785 Hyperlipidemia, unspecified: Secondary | ICD-10-CM

## 2015-08-07 DIAGNOSIS — I255 Ischemic cardiomyopathy: Secondary | ICD-10-CM

## 2015-08-07 DIAGNOSIS — I2510 Atherosclerotic heart disease of native coronary artery without angina pectoris: Secondary | ICD-10-CM | POA: Diagnosis not present

## 2015-08-07 DIAGNOSIS — I110 Hypertensive heart disease with heart failure: Secondary | ICD-10-CM

## 2015-08-07 MED ORDER — PANTOPRAZOLE SODIUM 40 MG PO TBEC: 40.0000 mg | DELAYED_RELEASE_TABLET | ORAL | Status: DC

## 2015-08-07 NOTE — Patient Instructions (Addendum)
Medication Instructions:  Your physician recommends that you continue on your current medications as directed. Please refer to the Current Medication list given to you today. It is important to take your medications on a daily basis to control your blood pressure. Labwork: None ordered Testing/Procedures: None ordered Follow-Up: Your physician recommends that you schedule a follow-up appointment in: [place], PA-C Any Other Special Instructions Will Be Listed Below (If Applicable). Your physician has requested that you regularly monitor and record your blood pressure readings at home. Please use the same machine at the same time of day to check your readings and record them to bring to your follow-up visit. BRING YOUR BLOOD PRESSURE MACHINE WITH YOU TO [place] #: [phone] If you need a refill on your cardiac medications before your next appointment, please call your pharmacy.

## 2015-08-13 ENCOUNTER — Ambulatory Visit (INDEPENDENT_AMBULATORY_CARE_PROVIDER_SITE_OTHER): Payer: PRIVATE HEALTH INSURANCE | Admitting: *Deleted

## 2015-08-13 DIAGNOSIS — I255 Ischemic cardiomyopathy: Secondary | ICD-10-CM | POA: Diagnosis not present

## 2015-08-15 NOTE — Progress Notes (Signed)
Remote ICD transmission.   

## 2015-08-22 ENCOUNTER — Ambulatory Visit: Payer: PRIVATE HEALTH INSURANCE | Admitting: Physician Assistant

## 2015-09-07 ENCOUNTER — Emergency Department (HOSPITAL_COMMUNITY): Payer: PRIVATE HEALTH INSURANCE

## 2015-09-07 ENCOUNTER — Inpatient Hospital Stay (HOSPITAL_COMMUNITY)
Admission: EM | Admit: 2015-09-07 | Discharge: 2015-09-08 | DRG: 292 | Disposition: A | Discharge status: Home / Self Care | Payer: PRIVATE HEALTH INSURANCE | Attending: Internal Medicine | Admitting: Internal Medicine

## 2015-09-07 ENCOUNTER — Encounter (HOSPITAL_COMMUNITY): Payer: Self-pay | Admitting: Emergency Medicine

## 2015-09-07 DIAGNOSIS — T502X6A Underdosing of carbonic-anhydrase inhibitors, benzothiadiazides and other diuretics, initial encounter: Secondary | ICD-10-CM | POA: Diagnosis present

## 2015-09-07 DIAGNOSIS — Z87891 Personal history of nicotine dependence: Secondary | ICD-10-CM

## 2015-09-07 DIAGNOSIS — I110 Hypertensive heart disease with heart failure: Secondary | ICD-10-CM | POA: Diagnosis not present

## 2015-09-07 DIAGNOSIS — I255 Ischemic cardiomyopathy: Secondary | ICD-10-CM | POA: Diagnosis present

## 2015-09-07 DIAGNOSIS — I248 Other forms of acute ischemic heart disease: Secondary | ICD-10-CM | POA: Diagnosis present

## 2015-09-07 DIAGNOSIS — Z7982 Long term (current) use of aspirin: Secondary | ICD-10-CM | POA: Diagnosis not present

## 2015-09-07 DIAGNOSIS — I2510 Atherosclerotic heart disease of native coronary artery without angina pectoris: Secondary | ICD-10-CM | POA: Diagnosis present

## 2015-09-07 DIAGNOSIS — Z9114 Patient's other noncompliance with medication regimen: Secondary | ICD-10-CM

## 2015-09-07 DIAGNOSIS — I509 Heart failure, unspecified: Secondary | ICD-10-CM

## 2015-09-07 DIAGNOSIS — R0602 Shortness of breath: Secondary | ICD-10-CM | POA: Diagnosis not present

## 2015-09-07 DIAGNOSIS — Z91128 Patient's intentional underdosing of medication regimen for other reason: Secondary | ICD-10-CM

## 2015-09-07 DIAGNOSIS — Z951 Presence of aortocoronary bypass graft: Secondary | ICD-10-CM

## 2015-09-07 DIAGNOSIS — F17200 Nicotine dependence, unspecified, uncomplicated: Secondary | ICD-10-CM | POA: Diagnosis present

## 2015-09-07 DIAGNOSIS — Z95810 Presence of automatic (implantable) cardiac defibrillator: Secondary | ICD-10-CM

## 2015-09-07 DIAGNOSIS — I252 Old myocardial infarction: Secondary | ICD-10-CM

## 2015-09-07 DIAGNOSIS — I5043 Acute on chronic combined systolic (congestive) and diastolic (congestive) heart failure: Secondary | ICD-10-CM | POA: Diagnosis present

## 2015-09-07 DIAGNOSIS — E785 Hyperlipidemia, unspecified: Secondary | ICD-10-CM | POA: Diagnosis present

## 2015-09-07 DIAGNOSIS — I5023 Acute on chronic systolic (congestive) heart failure: Secondary | ICD-10-CM | POA: Diagnosis not present

## 2015-09-07 DIAGNOSIS — Z8249 Family history of ischemic heart disease and other diseases of the circulatory system: Secondary | ICD-10-CM | POA: Diagnosis not present

## 2015-09-07 DIAGNOSIS — I5033 Acute on chronic diastolic (congestive) heart failure: Secondary | ICD-10-CM

## 2015-09-07 DIAGNOSIS — Z79899 Other long term (current) drug therapy: Secondary | ICD-10-CM | POA: Diagnosis not present

## 2015-09-07 DIAGNOSIS — N179 Acute kidney failure, unspecified: Secondary | ICD-10-CM | POA: Diagnosis present

## 2015-09-07 LAB — COMPREHENSIVE METABOLIC PANEL
ALT: 28 U/L (ref 17–63)
ANION GAP: 7 (ref 5–15)
AST: 68 U/L — AB (ref 15–41)
Albumin: 3.6 g/dL (ref 3.5–5.0)
Alkaline Phosphatase: 145 U/L — ABNORMAL HIGH (ref 38–126)
BUN: 21 mg/dL — AB (ref 6–20)
CHLORIDE: 109 mmol/L (ref 101–111)
CO2: 24 mmol/L (ref 22–32)
Calcium: 9.4 mg/dL (ref 8.9–10.3)
Creatinine, Ser: 1.44 mg/dL — ABNORMAL HIGH (ref 0.61–1.24)
GFR, EST NON AFRICAN AMERICAN: 56 mL/min — AB (ref >60)
Glucose, Bld: 152 mg/dL — ABNORMAL HIGH (ref 65–99)
POTASSIUM: 3.7 mmol/L (ref 3.5–5.1)
Sodium: 140 mmol/L (ref 135–145)
Total Bilirubin: 1 mg/dL (ref 0.3–1.2)
Total Protein: 6.9 g/dL (ref 6.5–8.1)

## 2015-09-07 LAB — I-STAT TROPONIN, ED: TROPONIN I, POC: 0.04 ng/mL (ref 0.00–0.08)

## 2015-09-07 LAB — CBC
HCT: 44.3 % (ref 39.0–52.0)
HEMOGLOBIN: 14.5 g/dL (ref 13.0–17.0)
MCH: 30 pg (ref 26.0–34.0)
MCHC: 32.7 g/dL (ref 30.0–36.0)
MCV: 91.7 fL (ref 78.0–100.0)
Platelets: 262 10*3/uL (ref 150–400)
RBC: 4.83 MIL/uL (ref 4.22–5.81)
RDW: 13.8 % (ref 11.5–15.5)
WBC: 7.2 10*3/uL (ref 4.0–10.5)

## 2015-09-07 LAB — TROPONIN I: Troponin I: 0.09 ng/mL — ABNORMAL HIGH (ref <0.031)

## 2015-09-07 LAB — BRAIN NATRIURETIC PEPTIDE: B NATRIURETIC PEPTIDE 5: 2206 pg/mL — AB (ref 0.0–100.0)

## 2015-09-07 MED ORDER — ASPIRIN EC 81 MG PO TBEC
81.0000 mg | DELAYED_RELEASE_TABLET | Freq: Every day | ORAL | Status: DC
  Administered 2015-09-08: 81 mg via ORAL
  Filled 2015-09-07: qty 1

## 2015-09-07 MED ORDER — ENOXAPARIN SODIUM 40 MG/0.4ML ~~LOC~~ SOLN
40.0000 mg | Freq: Every day | SUBCUTANEOUS | Status: DC
  Administered 2015-09-07: 40 mg via SUBCUTANEOUS
  Filled 2015-09-07: qty 0.4

## 2015-09-07 MED ORDER — ONDANSETRON HCL 4 MG/2ML IJ SOLN: 4.0000 mg | Freq: Four times a day (QID) | INTRAMUSCULAR | Status: DC | PRN

## 2015-09-07 MED ORDER — CARVEDILOL 25 MG PO TABS
25.0000 mg | ORAL_TABLET | Freq: Two times a day (BID) | ORAL | Status: DC
  Administered 2015-09-07 – 2015-09-08 (×3): 25 mg via ORAL
  Filled 2015-09-07 (×2): qty 1
  Filled 2015-09-07: qty 2

## 2015-09-07 MED ORDER — HYDRALAZINE HCL 25 MG PO TABS
50.0000 mg | ORAL_TABLET | Freq: Once | ORAL | Status: AC
  Administered 2015-09-07: 50 mg via ORAL
  Filled 2015-09-07: qty 2

## 2015-09-07 MED ORDER — PANTOPRAZOLE SODIUM 40 MG PO TBEC
40.0000 mg | DELAYED_RELEASE_TABLET | Freq: Every day | ORAL | Status: DC
  Administered 2015-09-07 – 2015-09-08 (×2): 40 mg via ORAL
  Filled 2015-09-07 (×2): qty 1

## 2015-09-07 MED ORDER — SODIUM CHLORIDE 0.9% FLUSH: 3.0000 mL | INTRAVENOUS | Status: DC | PRN

## 2015-09-07 MED ORDER — LISINOPRIL 20 MG PO TABS
20.0000 mg | ORAL_TABLET | Freq: Once | ORAL | Status: AC
  Administered 2015-09-07: 20 mg via ORAL
  Filled 2015-09-07: qty 1

## 2015-09-07 MED ORDER — ACETAMINOPHEN 325 MG PO TABS: 650.0000 mg | ORAL_TABLET | ORAL | Status: DC | PRN

## 2015-09-07 MED ORDER — SODIUM CHLORIDE 0.9% FLUSH
3.0000 mL | Freq: Two times a day (BID) | INTRAVENOUS | Status: DC
  Administered 2015-09-07 – 2015-09-08 (×3): 3 mL via INTRAVENOUS

## 2015-09-07 MED ORDER — SODIUM CHLORIDE 0.9 % IV SOLN: 250.0000 mL | INTRAVENOUS | Status: DC | PRN

## 2015-09-07 MED ORDER — FUROSEMIDE 10 MG/ML IJ SOLN
80.0000 mg | Freq: Once | INTRAMUSCULAR | Status: AC
  Administered 2015-09-07: 80 mg via INTRAVENOUS
  Filled 2015-09-07: qty 8

## 2015-09-07 NOTE — ED Notes (Signed)
Pt to ED via [facility] medic 40 from home, pt arrives on c-pap per ems. has not taken meds since Monday-- CABG in 2011, AICD in February- pt removed from C-Pap per Resp Therapy, placed on 4L/m/-- O2 sats are at 100%.  Pt diaphoretic/pale on arrival.

## 2015-09-07 NOTE — H&P (Signed)
Date: 09/07/2015               Patient Name:  Roger Martinez MRN: [id number]  DOB: 1966-01-29 Age / Sex: 50 y.o., male   PCP: No primary care provider on file.         Medical Service: Internal Medicine Teaching Service         Attending Physician: Dr. Bartholomew Crews, MD    First Contact: Dr. Blane Ohara Pager: [phone]  Second Contact: Dr. 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 He says 5 days ago, he stopped taking his heart medications and says that since then, he has developed worsening shortness of breath, as well as PND and orthopnea. He says before 5 days ago when he was taking his medications, he did not have any of these symptoms and could tolerate walking 2 miles without issue as well as lie flat. He also notes worsening fatigue over this time. He denies cough, chest pain, palpitations, diaphoresis, leg or abdominal swelling, nausea, vomiting, diarrhea, urinary symptoms, recent illness, or other symptoms at this time. He says he has his medications, but has been under significant financial stress due to unemployment as well as being tired of dealing with his chronic medical problems, both of which precipitated him not taking his medications. He is a current 1 pack per day smoker for the past 20 years, but denies alcohol or recreational drug abuse.  Meds: Current Facility-Administered Medications  Medication Dose Route Frequency Provider Last Rate Last Dose  . 0.9 %  sodium chloride infusion  250 mL Intravenous PRN Milagros Loll, MD      . acetaminophen (TYLENOL) tablet 650 mg  650 mg Oral Q4H  PRN Milagros Loll, MD      . aspirin EC tablet 81 mg  81 mg Oral Daily Milagros Loll, MD      . carvedilol (COREG) tablet 25 mg  25 mg Oral BID WC Shary Decamp, PA-C   25 mg at 09/07/15 1137  . enoxaparin (LOVENOX) injection 40 mg  40 mg Subcutaneous Q24H Milagros Loll, MD      . ondansetron [facility]) injection 4 mg  4 mg Intravenous Q6H PRN Milagros Loll, MD      . pantoprazole (PROTONIX) EC tablet 40 mg  40 mg Oral Daily Milagros Loll, MD      . sodium chloride flush (NS) 0.9 % injection 3 mL  3 mL Intravenous Q12H Milagros Loll, MD      . sodium chloride flush (NS) 0.9 % injection 3 mL  3 mL Intravenous PRN Milagros Loll, MD       Current Outpatient Prescriptions  Medication Sig Dispense Refill  . aspirin EC 81 MG tablet Take 81 mg by mouth every 3 (three) days.     . carvedilol (COREG) 25 MG tablet Take 1 tablet (25 mg total) by mouth 2 (two) times daily with a meal. (Patient taking differently: Take 25 mg by mouth See admin instructions. Pt takes every 3 days. Prescribed twice daily) 60 tablet 3  . furosemide (LASIX) 40  MG tablet Take 1 tablet (40 mg total) by mouth daily. (Patient taking differently: Take 40 mg by mouth See admin instructions. Every 3 days) 30 tablet 3  . hydrALAZINE (APRESOLINE) 50 MG tablet Take 50 mg by mouth See admin instructions. Every 3 days    . lisinopril (PRINIVIL,ZESTRIL) 10 MG tablet Take 10 mg by mouth See admin instructions. Every 3 days    . lisinopril (PRINIVIL,ZESTRIL) 20 MG tablet Take 1 tablet (20 mg total) by mouth daily. (Patient taking differently: Take 20 mg by mouth See admin instructions. Every 3 days.) 30 tablet 3  . pantoprazole (PROTONIX) 40 MG tablet Take 1 tablet (40 mg total) by mouth every 3 (three) days. 30 tablet 3  . potassium chloride SA (K-DUR,KLOR-CON) 20 MEQ tablet Take 20 mEq by mouth See admin instructions. Every 3 days. Prescribed daily      Allergies: Allergies as of 09/07/2015  . (No Known Allergies)   Past  Medical History  Diagnosis Date  . Coronary artery disease     a. 2011 s/p CABG x 3.  . Hypertensive heart disease with heart failure ([place])   . Chronic combined systolic (congestive) and diastolic (congestive) heart failure ([place])     a. 03/2014 Echo: EF 20-25%, diff HK, Gr 2 DD, mild AI, sev dil LA, mod reduced RV fxn, PASP 71mmHg.  . Ischemic cardiomyopathy     a. 03/2014 Echo: EF 20-25%;  b. 06/2014 s/p BSX single lead AICD, ser # [id number].  Marland Kitchen Anxiety state, unspecified   . MI (myocardial infarction) ([place]) 03/2011  . STD (sexually transmitted disease)   . Hyperlipidemia   . Tobacco abuse   . Noncompliance   . Marijuana abuse    Past Surgical History  Procedure Laterality Date  . Bypass graft  2011    3 vessel  . Ep implantable device  06/17/2014    DR [name]  . Implantable cardioverter defibrillator implant N/A 06/17/2014    Procedure: IMPLANTABLE CARDIOVERTER DEFIBRILLATOR IMPLANT;  Surgeon: Deboraha Sprang, MD;  Location: [facility] CATH LAB;  Service: Cardiovascular;  Laterality: N/A;  . Incision and drainage perirectal abscess N/A 06/13/2015    Procedure: IRRIGATION AND DEBRIDEMENT PERIRECTAL ABSCESS;  Surgeon: Michael Boston, MD;  Location: [facility] ORS;  Service: General;  Laterality: N/A;   Family History  Problem Relation Age of Onset  . Hypertension Mother     deceased.  . Diabetes Mother   . Other Father     alive - pt unaware of medical hx.   Social History   Social History  . Marital Status: Legally Separated    Spouse Name: N/A  . Number of Children: N/A  . Years of Education: N/A   Occupational History  . Not on file.   Social History Main Topics  . Smoking status: Former Smoker -- 0.00 packs/day for 20 years  . Smokeless tobacco: Never Used  . Alcohol Use: No  . Drug Use: Yes    Special: Marijuana     Comment: none for 2 weeks  . Sexual Activity: Not on file   Other Topics Concern  . Not on file   Social History Narrative   Lives in [place] with wife.  Does  not routinely exercise.  Unemployed.    Review of Systems: Pertinent items noted in HPI and remainder of comprehensive ROS otherwise negative.  Physical Exam: Blood pressure 147/105, pulse 56, temperature 98.2 F (36.8 C), temperature source Oral, resp. rate 13, SpO2 99 %.   Gen:  Fatigued-appearing, alert and oriented to person, place, and time HEENT: Oropharynx clear without erythema or exudate.  Neck: No cervical LAD, no thyromegaly or nodules, ~10cm JVD noted CV: Normal rate, regular rhythm, no murmurs. Physiologic splitting heard on inspiration only; no S3 or S4 Pulmonary: Normal effort, CTA bilaterally, no crackles or wheezes Abdominal: Soft, non-tender, non-distended, without rebound, guarding, or masses Extremities: Distal pulses 2+ in upper and lower extremities bilaterally, no tenderness, erythema or edema Neuro: CN II-XII grossly intact, no focal weakness or sensory deficits noted Skin: No atypical appearing moles. No rashes  Lab results: Basic Metabolic Panel:  Recent Labs  09/07/15 1119  NA 140  K 3.7  CL 109  CO2 24  GLUCOSE 152*  BUN 21*  CREATININE 1.44*  CALCIUM 9.4   Liver Function Tests:  Recent Labs  09/07/15 1119  AST 68*  ALT 28  ALKPHOS 145*  BILITOT 1.0  PROT 6.9  ALBUMIN 3.6   CBC:  Recent Labs  09/07/15 1119  WBC 7.2  HGB 14.5  HCT 44.3  MCV 91.7  PLT 262   Imaging results:  Dg Chest Portable 1 View  09/07/2015  CLINICAL DATA:  50 year old male with severe shortness of breath. Patient has stopped taking his diuretics 7 days previously. EXAM: PORTABLE CHEST 1 VIEW COMPARISON:  Prior chest x-ray 06/13/2015 FINDINGS: Left subclavian approach single lead intracardiac defibrillator with lead projecting over the right ventricle. Stable cardiomegaly with left heart enlargement. Patient is status post median sternotomy with evidence of prior multivessel CABG. Stable pulmonary vascular congestion without overt edema. No definite pleural  effusion, focal airspace consolidation or pneumothorax. No acute osseous abnormality. IMPRESSION: Stable chest x-ray without evidence of acute cardiopulmonary process. Specifically, cardiomegaly with left heart enlargement and pulmonary vascular congestion without overt edema appears similar compared to 06/13/2015. Electronically Signed   By: Jacqulynn Cadet M.D.   On: 09/07/2015 11:19   Other results: EKG: normal sinus rhythm, biatrial enlargement, LVH.  Assessment & Plan by Problem: Active Problems:   Acute CHF (congestive heart failure) ([place]) 1. Acute exacerbation of CHF 2/2 CAD/ischemic cardiomyopathy - patient with worsening shortness of breath orthopnea and PND after not taking medications for the past 5 days. Patient appears fatigued but comfortable at bedside, not tachycardic, 99% on 2 L, lungs clear on exam, EKG unchanged from previous, troponin not elevated, CXR with stable vascular congestion but no obvious pulmonary edema, and BNP in the 2200's. Most likely acute CHF exacerbation in the setting of medication noncompliance. Less likely ACS or PE given his presentation and findings. -Furosemide 80 mg IV; continue IV diuresis -Resume home ASA, carvedilol, lisinopril -Strict I's and O's, daily weights, telemetry -O2 via [place] to keep sats greater than 92% -Repeat BMP in a.m. to monitor Cr and K while diuresing -Encouraged smoking cessation -Patient does not have PCP currently, will need to establish after discharge -CSW following; appreciate the thoughtful care of this mutual patient  2. HTN -Continue home meds as above as well as home hydralazine  Dispo: Disposition is deferred at this time, awaiting improvement of current medical problems. Anticipated discharge in approximately 1-3 day(s).   The patient does not have a current PCP (No primary care provider on file.) and does need an [facility] hospital follow-up appointment after discharge.  The patient does not have transportation  limitations that hinder transportation to clinic appointments.  Signed: [name], MD 09/07/2015, 3:25 PM

## 2015-09-07 NOTE — Progress Notes (Signed)
CSW engaged with Patient at his bedside to discuss unemployment concerns. CSW provided Patient with information for the [employer] and [place] at [facility] that will provide career counseling, resume building, network resources, workshops and the overall job search. CSW also discussed [employer] (Jobs On the Outside) program RN CM has already provided Patient with food pantry and free meal resources. CSW signing off. Please contact if new need(s) arise.     Emiliano Dyer, LCSW [facility] ED/30M Clinical Social Worker [phone]

## 2015-09-07 NOTE — Progress Notes (Signed)
RT called to pt's room due to pt being brought in by EMS on cpap. Pt diaphoretic, but states breathing slowly improving. Pt taken off cpap and placed on 4L [place] with spo2 100% and decreased WOB. Pt states he has not taken his lasix since Monday "because you are supposed to take it with food and i'm unemployed". RN made aware. RT will continue to monitor.

## 2015-09-07 NOTE — ED Provider Notes (Signed)
CSN: [id number]     Arrival date & time 09/07/15  1057 History   First MD Initiated Contact with Patient 09/07/15 1106     Chief Complaint  Patient presents with  . Shortness of Breath   (Consider location/radiation/quality/duration/timing/severity/associated sxs/prior Treatment) HPI 50 y.o. male with a hx of CAD status post CABG, dilated ischemic cardiomyopathy, combined systolic and diastolic CHF, hypertensive heart disease, HL, tobacco abuse, presents to the Emergency Department today via EMS due to shortness of breath. Notes non compliance with home medication due to cost. Pt is unable to afford all of his home medication due to financial stress. Notes no CP/ABD pain. No emesis or diarrhea. No diaphoresis. No fevers.  Pt previously admitted 2/16 with a/c combined systolic and diastolic HF. Patient was not taking his medications correctly at that time. He had minimally elevated troponin levels felt to be related to demand ischemia. No other symptoms noted  Past Medical History  Diagnosis Date  . Coronary artery disease     a. 2011 s/p CABG x 3.  . Hypertensive heart disease with heart failure ([place])   . Chronic combined systolic (congestive) and diastolic (congestive) heart failure ([place])     a. 03/2014 Echo: EF 20-25%, diff HK, Gr 2 DD, mild AI, sev dil LA, mod reduced RV fxn, PASP 24mmHg.  . Ischemic cardiomyopathy     a. 03/2014 Echo: EF 20-25%;  b. 06/2014 s/p BSX single lead AICD, ser # [id number].  Marland Kitchen Anxiety state, unspecified   . MI (myocardial infarction) ([place]) 03/2011  . STD (sexually transmitted disease)   . Hyperlipidemia   . Tobacco abuse   . Noncompliance   . Marijuana abuse    Past Surgical History  Procedure Laterality Date  . Bypass graft  2011    3 vessel  . Ep implantable device  06/17/2014    DR [name]  . Implantable cardioverter defibrillator implant N/A 06/17/2014    Procedure: IMPLANTABLE CARDIOVERTER DEFIBRILLATOR IMPLANT;  Surgeon: Deboraha Sprang, MD;  Location: [facility]  CATH LAB;  Service: Cardiovascular;  Laterality: N/A;  . Incision and drainage perirectal abscess N/A 06/13/2015    Procedure: IRRIGATION AND DEBRIDEMENT PERIRECTAL ABSCESS;  Surgeon: Michael Boston, MD;  Location: [facility] ORS;  Service: General;  Laterality: N/A;   Family History  Problem Relation Age of Onset  . Hypertension Mother     deceased.  . Diabetes Mother   . Other Father     alive - pt unaware of medical hx.   Social History  Substance Use Topics  . Smoking status: Former Smoker -- 0.00 packs/day for 20 years  . Smokeless tobacco: Never Used  . Alcohol Use: No    Review of Systems ROS reviewed and all are negative for acute change except as noted in the HPI.  Allergies  Review of patient's allergies indicates no known allergies.  Home Medications   Prior to Admission medications   Medication Sig Start Date End Date Taking? Authorizing Provider  aspirin EC 81 MG tablet Take 81 mg by mouth every 3 (three) days.     Historical Provider, MD  carvedilol (COREG) 25 MG tablet Take 1 tablet (25 mg total) by mouth 2 (two) times daily with a meal. 06/23/15   Burtis Junes, NP  furosemide (LASIX) 40 MG tablet Take 1 tablet (40 mg total) by mouth daily. 06/23/15   Burtis Junes, NP  hydrALAZINE (APRESOLINE) 50 MG tablet Take 50 mg by mouth 3 (three) times daily.  Historical Provider, MD  lisinopril (PRINIVIL,ZESTRIL) 20 MG tablet Take 1 tablet (20 mg total) by mouth daily. 06/14/15   Michael Boston, MD  pantoprazole (PROTONIX) 40 MG tablet Take 1 tablet (40 mg total) by mouth every 3 (three) days. 08/07/15   Liliane Shi, PA-C  potassium chloride SA (K-DUR,KLOR-CON) 20 MEQ tablet Take 20 mEq by mouth daily.    Historical Provider, MD   There were no vitals taken for this visit.   Physical Exam  Constitutional: He is oriented to person, place, and time. He appears well-developed and well-nourished.  HENT:  Head: Normocephalic and atraumatic.  Eyes: EOM are normal. Pupils are equal,  round, and reactive to light.  Neck: Normal range of motion. Neck supple. No tracheal deviation present.  Cardiovascular: Normal rate, regular rhythm, normal heart sounds and intact distal pulses.   No murmur heard. Pulmonary/Chest: Accessory muscle usage present. No respiratory distress. He has rales in the right upper field, the right lower field, the left upper field and the left lower field.  Abdominal: Soft.  Musculoskeletal: Normal range of motion.  Neurological: He is alert and oriented to person, place, and time.  Skin: Skin is warm and dry.  Psychiatric: He has a normal mood and affect. His behavior is normal. Thought content normal.  Nursing note and vitals reviewed.  ED Course  Procedures (including critical care time) Labs Review Labs Reviewed  COMPREHENSIVE METABOLIC PANEL - Abnormal; Notable for the following:    Glucose, Bld 152 (*)    BUN 21 (*)    Creatinine, Ser 1.44 (*)    AST 68 (*)    Alkaline Phosphatase 145 (*)    GFR calc non Af Amer 56 (*)    All other components within normal limits  BRAIN NATRIURETIC PEPTIDE - Abnormal; Notable for the following:    B Natriuretic Peptide 2206.0 (*)    All other components within normal limits  CBC  I-STAT TROPOININ, ED   Imaging Review Dg Chest Portable 1 View  09/07/2015  CLINICAL DATA:  50 year old male with severe shortness of breath. Patient has stopped taking his diuretics 7 days previously. EXAM: PORTABLE CHEST 1 VIEW COMPARISON:  Prior chest x-ray 06/13/2015 FINDINGS: Left subclavian approach single lead intracardiac defibrillator with lead projecting over the right ventricle. Stable cardiomegaly with left heart enlargement. Patient is status post median sternotomy with evidence of prior multivessel CABG. Stable pulmonary vascular congestion without overt edema. No definite pleural effusion, focal airspace consolidation or pneumothorax. No acute osseous abnormality. IMPRESSION: Stable chest x-ray without evidence of  acute cardiopulmonary process. Specifically, cardiomegaly with left heart enlargement and pulmonary vascular congestion without overt edema appears similar compared to 06/13/2015. Electronically Signed   By: Jacqulynn Cadet M.D.   On: 09/07/2015 11:19   I have personally reviewed and evaluated these images and lab results as part of my medical decision-making.   EKG Interpretation   Date/Time:  Sunday Sep 07 2015 11:01:24 EDT Ventricular Rate:  81 PR Interval:  194 QRS Duration: 124 QT Interval:  422 QTC Calculation: 490 R Axis:   93 Text Interpretation:  Sinus rhythm Biatrial enlargement LVH with secondary  repolarization abnormality Borderline prolonged QT interval Non-specific  ST-t changes No significant change since last tracing Confirmed by Ashok Cordia   MD, Lennette Bihari ([id number]) on 09/07/2015 11:06:49 AM      MDM  I have reviewed and evaluated the relevant laboratory values I have reviewed and evaluated the relevant imaging studies.  I have interpreted the relevant  EKG. I have reviewed the relevant previous healthcare records. I have reviewed EMS Documentation. I obtained HPI from historian. Patient discussed with supervising physician  ED Course:  Assessment: Pt is a 30yM with hx of CAD status post CABG, dilated ischemic cardiomyopathy, combined systolic and diastolic CHF, hypertensive heart disease, HL, tobacco abuse who presents with shortness of breath due to noncompliance with home medications of lasix and other BP medication. No chest pain. On exam, pt in NAD. Nontoxic/nonseptic appearing. VSS. Afebrile. Lungs with bilateral crackles. Heart RRR. Abdomen nontender soft. iStat Trop negative. BNP 2206. CXR showed vascular congestion. EKG showed no acute abnormalities. Given Lasix 80mg  IV and Coreg PO in ED due to vascular congestion and elevated BP. Plan is to Admit to Medicine under Teaching Service for Acute on Chronic CHF Exacerbation.    Disposition/Plan:  Admit Pt acknowledges and  agrees with plan  Supervising Physician Lajean Saver, MD   Final diagnoses:  Acute on chronic diastolic congestive heart failure [facility])     Shary Decamp, PA-C 09/07/15 [place], MD 09/09/15 1239

## 2015-09-07 NOTE — Care Management Note (Signed)
Case Management Note  Patient Details  Name: Roger Martinez MRN: [id number] Date of Birth: 1965/09/04  Subjective/Objective:  50 y.o. M seen in the ED today for SOB. Documented Hx of  non-compliance with medications due to cost,  "needs to take medications with food and has been out of work for the past two years". The pt is on Lasix and Coreg which are both on the $4.00 list at [facility].  History of CAD, CABG 2011,   ICD implant 06-2014.                    Action/Plan: Referred to CSW for assistance with joblessness. CM provided pt with a copy of "little green book" which provides listing of  free meals provided in [place]. Reiterated need to comply with medication regime and that medications are offered at lowest prices. Pt appreciative and voiced no questions. States he is in the process of seeking disability.    Expected Discharge Date:                  Expected Discharge Plan:  Home/Self Care  In-House Referral:  Clinical Social Work  Discharge planning Services  CM Consult  Post Acute Care Choice:    Choice offered to:     DME Arranged:    DME Agency:     HH Arranged:    [place] Agency:     Status of Service:  Completed, signed off  Medicare Important Message Given:    Date Medicare IM Given:    Medicare IM give by:    Date Additional Medicare IM Given:    Additional Medicare Important Message give by:     If discussed at [place] of Stay Meetings, dates discussed:    Additional Comments:  Delrae Sawyers, RN 09/07/2015, 12:18 PM

## 2015-09-08 LAB — TROPONIN I: TROPONIN I: 0.24 ng/mL — AB (ref <0.031)

## 2015-09-08 MED ORDER — LOVASTATIN 20 MG PO TABS: 20.0000 mg | ORAL_TABLET | Freq: Every day | ORAL | Status: DC

## 2015-09-08 NOTE — Care Management Note (Signed)
Case Management Note  Patient Details  Name: Roger Martinez MRN: [id number] Date of Birth: 12/21/1965  Subjective/Objective:           CM following for progression and d/c planning.         Action/Plan: 09/08/2015 Noted the CM has met with this pt in ED re needs. Also noted that pt has insurance with a medication plan as well as resources to purchase tobacco products daily. Pt d/c to home, hopefully will resume meds as ordered.   Expected Discharge Date:  09/08/2015              Expected Discharge Plan:  Home/Self Care  In-House Referral:  Clinical Social Work  Discharge planning Services  CM Consult  Post Acute Care Choice:  NA Choice offered to:  NA  DME Arranged:  N/A DME Agency:  NA  HH Arranged:  NA HH Agency:  NA  Status of Service:  Completed, signed off  Medicare Important Message Given:    Date Medicare IM Given:    Medicare IM give by:    Date Additional Medicare IM Given:    Additional Medicare Important Message give by:     If discussed at Long Length of Stay Meetings, dates discussed:    Additional Comments:  Royal, Cheryl U, RN 09/08/2015, 2:55 PM  

## 2015-09-08 NOTE — Progress Notes (Signed)
Subjective: [name] had NAEON. This morning, he is ambulating comfortably around his room without symptoms on room air. He was able to lie flat last night. He denies any symptoms at this time, including SOB, CP, palpitations, leg swelling, cough, or other issues at this time.   Objective: Vital signs in last 24 hours: Filed Vitals:   09/07/15 2155 09/08/15 0634 09/08/15 0635 09/08/15 0753  BP:  177/103 157/111 177/120  Pulse: 62 57  59  Temp: 97.8 F (36.6 C) 97.6 F (36.4 C)  98 F (36.7 C)  TempSrc: Oral Oral  Oral  Resp: 18 18  18   Height:      Weight:   184 lb 3.2 oz (83.553 kg)   SpO2: 100% 100%  99%   Weight change:   Intake/Output Summary (Last 24 hours) at 09/08/15 0824 Last data filed at 09/08/15 [id number]  Gross per 24 hour  Intake    960 ml  Output    940 ml  Net     20 ml     Gen: Well-appearing, alert and oriented to person, place, and time HEENT: Oropharynx clear without erythema or exudate.  Neck: No cervical LAD, no thyromegaly or nodules, ~8cm JVD noted CV: Normal rate, regular rhythm, no murmurs. Physiologic splitting heard on inspiration only; no S3 or S4 Pulmonary: Normal effort, CTA bilaterally, no crackles or wheezes Abdominal: Soft, non-tender, non-distended, without rebound, guarding, or masses Extremities: Distal pulses 2+ in upper and lower extremities bilaterally, no tenderness, erythema or edema  Lab Results: Basic Metabolic Panel:  Recent Labs Lab 09/07/15 1119  NA 140  K 3.7  CL 109  CO2 24  GLUCOSE 152*  BUN 21*  CREATININE 1.44*  CALCIUM 9.4   Liver Function Tests:  Recent Labs Lab 09/07/15 1119  AST 68*  ALT 28  ALKPHOS 145*  BILITOT 1.0  PROT 6.9  ALBUMIN 3.6   CBC:  Recent Labs Lab 09/07/15 1119  WBC 7.2  HGB 14.5  HCT 44.3  MCV 91.7  PLT 262   Cardiac Enzymes:  Recent Labs Lab 09/07/15 1706 09/07/15 2321  TROPONINI 0.09* 0.24*   Studies/Results: Dg Chest Portable 1 View  09/07/2015  CLINICAL  DATA:  50 year old male with severe shortness of breath. Patient has stopped taking his diuretics 7 days previously. EXAM: PORTABLE CHEST 1 VIEW COMPARISON:  Prior chest x-ray 06/13/2015 FINDINGS: Left subclavian approach single lead intracardiac defibrillator with lead projecting over the right ventricle. Stable cardiomegaly with left heart enlargement. Patient is status post median sternotomy with evidence of prior multivessel CABG. Stable pulmonary vascular congestion without overt edema. No definite pleural effusion, focal airspace consolidation or pneumothorax. No acute osseous abnormality. IMPRESSION: Stable chest x-ray without evidence of acute cardiopulmonary process. Specifically, cardiomegaly with left heart enlargement and pulmonary vascular congestion without overt edema appears similar compared to 06/13/2015. Electronically Signed   By: Jacqulynn Cadet M.D.   On: 09/07/2015 11:19   Assessment/Plan: 1. Acute exacerbation of CHF 2/2 CAD/ischemic cardiomyopathy - patient with worsening shortness of breath orthopnea and PND after not taking medications for the past 5 days. Patient appears fatigued but comfortable at bedside, not tachycardic, 99% on 2 L, lungs clear on exam, EKG unchanged from previous, troponin not elevated, CXR with stable vascular congestion but no obvious pulmonary edema, and BNP in the 2200's. Most likely acute CHF exacerbation in the setting of medication noncompliance. Mild troponin elevation from 0.04 to 0.24 likely due to demand ischemia. -Furosemide 80 mg IV yesterday.  Will start back on PO home dose -Resume home ASA, carvedilol, lisinopril -Consider adding lovastatin 20mg  at next visit, on [employer] $4 list -Strict I's and O's, daily weights, telemetry -O2 via [place] to keep sats greater than 92% -Repeat BMP in a.m. to monitor Cr and K while diuresing and recurrent troponins given mild elevation - however patient adamantly refusing both. Will need to f/u at OP  visit -Encouraged smoking cessation -Patient does not have PCP currently, will need to establish after discharge -CSW following; appreciate the thoughtful care of this mutual patient  2. HTN -Continue home meds as above as well as home hydralazine  Dispo: Disposition is deferred at this time, awaiting improvement of current medical problems.  Anticipated discharge today.  The patient does not have a current PCP (No primary care provider on file.) and does need an [facility] hospital follow-up appointment after discharge.  The patient does not have transportation limitations that hinder transportation to clinic appointments.  LOS: 1 day   [name], MD 09/08/2015, 8:24 AM

## 2015-09-08 NOTE — Care Management Note (Signed)
Case Management Note  Patient Details  Name: Roger Martinez MRN: [id number] Date of Birth: Sep 04, 1965  Subjective/Objective:       CM following for progression and d/c planning.             Action/Plan: 09/07/2017 Pt for d/c to home today, no HH or DME needs identified.   Expected Discharge Date:  09/08/2015              Expected Discharge Plan:  Home/Self Care  In-House Referral:  Clinical Social Work  Discharge planning Services  CM Consult  Post Acute Care Choice:  NA Choice offered to:  NA  DME Arranged:  N/A DME Agency:  NA  HH Arranged:  NA HH Agency:  NA  Status of Service:  Completed, signed off  Medicare Important Message Given:    Date Medicare IM Given:    Medicare IM give by:    Date Additional Medicare IM Given:    Additional Medicare Important Message give by:     If discussed at [place] of Stay Meetings, dates discussed:    Additional Comments:  Adron Bene, RN 09/08/2015, 1:03 PM

## 2015-09-08 NOTE — Progress Notes (Signed)
  Date: 09/08/2015  Patient name: Roger Martinez  Medical record number: [id number]  Date of birth: Aug 24, 1965   I have seen and evaluated [name] and discussed their care with the Residency Team. Mr Sneden has ischemic systolic HF and is prescribed ASA, statin, ACEI, and BB. Outpt notes and the pt confirm that he does not take the meds as prescribed. He stopped taking the meds 5 days ago and gradually developed dyspnea, orthopnea, and PND. On admit, his BNP was 2200 but all other findings did not indicate volume overload (CXR, lung exam, no edema in legs). He has given one dose of 80 IV lasix and diuresed about 1 L. He is walking freely in the hallways, wants additional juice, and to go home.  PMHx, Fam Hx, and/or Soc Hx : Married, got insurance via wife's job.  Filed Vitals:   09/08/15 0635 09/08/15 0753  BP: 157/111 177/120  Pulse:  59  Temp:  98 F (36.7 C)  Resp:  18  Afebrile HR 53 - 82 RR 18 99% RA  NAD, able to speak in full sentences Exam per Dr Merrilyn Puma (pt irritable at being denied additional juice so exam limited to Dr Merrilyn Puma) [facility]  Ext no edema  Cr 1.44, baseline 1 - 1.2, refused AM labs K 3.7 Trop 0.04 - 0.09 - 0.24 - refused  I personally viewed his CXR images and confirmed by reading with the official read. Portable, pacer, slight L effusion, no overt edema  I personally viewed his EKG and confirmed by reading with the official read. Sinus, axis nl / R, biatrial enlargement, NSTWC inf and laterally, no ischemic changes  Assessment and Plan: I have seen and evaluated the patient as outlined above. I agree with the formulated Assessment and Plan as detailed in the residents' note, with the following changes:   1. Acute on chronic systolic iscehmic HF exac - the etiology is 2/2 not taking his meds which has been a long term issue with him. No indication that this is 2/2 arrhthymias or ischemic or other etiology. He appears to be euvolemic and states he  feels at baseline. Cards has encouraged him to est as pt at [facility] but he has yet to do this. [facility] does not have a pharmacy and therefore we encouraged him also to est at [facility]. He needs a long term relationship with a provider, in addition to cards, to maximize med compliance.   He is stable to D/C home.  Bartholomew Crews, MD 5/29/201712:08 PM

## 2015-09-10 NOTE — Discharge Summary (Signed)
Name: Roger Martinez MRN: [id number] DOB: [birth year] 50 y.o. PCP: No primary care provider on file.  Date of Admission: 09/07/2015 10:57 AM Date of Discharge: 09/10/2015 Attending Physician: No att. providers found  Discharge Diagnosis: 1. Acute CHF exacerbation  Active Problems:   Acute CHF (congestive heart failure) ([place])  Discharge Medications:   Medication List    TAKE these medications        aspirin EC 81 MG tablet  Take 81 mg by mouth every 3 (three) days.     carvedilol 25 MG tablet  Commonly known as:  COREG  Take 1 tablet (25 mg total) by mouth 2 (two) times daily with a meal.     furosemide 40 MG tablet  Commonly known as:  LASIX  Take 1 tablet (40 mg total) by mouth daily.     hydrALAZINE 50 MG tablet  Commonly known as:  APRESOLINE  Take 50 mg by mouth See admin instructions. Every 3 days     lisinopril 10 MG tablet  Commonly known as:  PRINIVIL,ZESTRIL  Take 10 mg by mouth See admin instructions. Every 3 days     lisinopril 20 MG tablet  Commonly known as:  PRINIVIL,ZESTRIL  Take 1 tablet (20 mg total) by mouth daily.     lovastatin 20 MG tablet  Commonly known as:  MEVACOR  Take 1 tablet (20 mg total) by mouth at bedtime.     pantoprazole 40 MG tablet  Commonly known as:  PROTONIX  Take 1 tablet (40 mg total) by mouth every 3 (three) days.     potassium chloride SA 20 MEQ tablet  Commonly known as:  K-DUR,KLOR-CON  Take 20 mEq by mouth See admin instructions. Every 3 days. Prescribed daily        Disposition and follow-up:   RogerJerris Martinez was discharged from [facility] in Good condition.  At the hospital follow up visit please address:  1.  Is he taking his medications as prescribed? Is he still without symptoms of CHF?  2.  Labs / imaging needed at time of follow-up: troponin, BMP (Cr recheck)  3.  Pending labs/ test needing follow-up: None  Follow-up Appointments:     Follow-up Information    Follow up with  [facility]. Schedule an appointment as soon as possible for a visit in 2 weeks.   Why:  Hospital follow-up   Contact information:   [address] [place] [phone]      Discharge Instructions: Discharge Instructions    Diet - low sodium heart healthy    Complete by:  As directed      Increase activity slowly    Complete by:  As directed            Consultations:    Procedures Performed:  Dg Chest Portable 1 View  09/07/2015  CLINICAL DATA:  50 year old male with severe shortness of breath. Patient has stopped taking his diuretics 7 days previously. EXAM: PORTABLE CHEST 1 VIEW COMPARISON:  Prior chest x-ray 06/13/2015 FINDINGS: Left subclavian approach single lead intracardiac defibrillator with lead projecting over the right ventricle. Stable cardiomegaly with left heart enlargement. Patient is status post median sternotomy with evidence of prior multivessel CABG. Stable pulmonary vascular congestion without overt edema. No definite pleural effusion, focal airspace consolidation or pneumothorax. No acute osseous abnormality. IMPRESSION: Stable chest x-ray without evidence of acute cardiopulmonary process. Specifically, cardiomegaly with left heart enlargement and pulmonary vascular congestion  without overt edema appears similar compared to 06/13/2015. Electronically Signed   By: Jacqulynn Cadet M.D.   On: 09/07/2015 11:19   2D Echo: None  Cardiac Cath: None  Admission HPI: Roger Martinez is a 50 year old current 20-pack-year smoker with CAD s/p 3V CABG in 2011, HFrEF 2/2 ischemic cardiomyopathy s/p pacemaker 06/2014 (EF 25-30% on TTE 04/2015), and HTN who presents with shortness of breath. He says 5 days ago, he stopped taking his heart medications and says that since then, he has developed worsening shortness of breath, as well as PND and orthopnea. He says before 5 days ago when he was taking his medications, he did not  have any of these symptoms and could tolerate walking 2 miles without issue as well as lie flat. He also notes worsening fatigue over this time. He denies cough, chest pain, palpitations, diaphoresis, leg or abdominal swelling, nausea, vomiting, diarrhea, urinary symptoms, recent illness, or other symptoms at this time. He says he has his medications, but has been under significant financial stress due to unemployment as well as being tired of dealing with his chronic medical problems, both of which precipitated him not taking his medications. He is a current 1 pack per day smoker for the past 20 years, but denies alcohol or recreational drug abuse.  Hospital Course by problem list: Active Problems:   Acute CHF (congestive heart failure) ([place])   1. Acute exacerbation of CHF 2/2 CAD/ischemic cardiomyopathy - due to not taking medications (although does have adequate supply of them). Lungs clear on exam, EKG unchanged from previous, CXR with stable vascular congestion but no obvious pulmonary edema, and BNP in the 2200's. He had a mild troponin elevation from 0.04 to 0.24 likely due to demand ischemia as well as a mild AKI, but repeat labs were unable to be obtained due to patient refusing despite multiple attempts. The patient was diuresed well with furosemide 80mg  IV initially, then back to his home medications. He was able to ambulate without any symptoms on room air on multiple occasions the next day, and was discharged home thereafter.  Discharge Vitals:   BP 177/120 mmHg  Pulse 59  Temp(Src) 98 F (36.7 C) (Oral)  Resp 18  Ht 5\' 11"  (1.803 m)  Wt 184 lb 3.2 oz (83.553 kg)  BMI 25.70 kg/m2  SpO2 99%  Discharge Labs:  No results found for this or any previous visit (from the past 24 hour(s)).  Signed: [name], MD 09/10/2015, 7:50 AM    Services Ordered on Discharge: None Equipment Ordered on Discharge: None

## 2015-09-11 ENCOUNTER — Ambulatory Visit: Payer: PRIVATE HEALTH INSURANCE | Admitting: Physician Assistant

## 2015-09-19 ENCOUNTER — Encounter: Payer: Self-pay | Admitting: Cardiology

## 2015-09-23 LAB — CUP PACEART REMOTE DEVICE CHECK
Battery Remaining Longevity: 144 mo
Date Time Interrogation Session: 20170503045100
HIGH POWER IMPEDANCE MEASURED VALUE: 57 Ohm
Implantable Lead Implant Date: 20160307
Implantable Lead Location: 753860
Implantable Lead Model: 293
Implantable Lead Serial Number: 349317
Lead Channel Pacing Threshold Pulse Width: 0.4 ms
Lead Channel Setting Pacing Pulse Width: 0.4 ms
MDC IDC MSMT BATTERY REMAINING PERCENTAGE: 100 %
MDC IDC MSMT LEADCHNL RV IMPEDANCE VALUE: 385 Ohm
MDC IDC MSMT LEADCHNL RV PACING THRESHOLD AMPLITUDE: 1.9 V
MDC IDC PG SERIAL: 201422
MDC IDC SET LEADCHNL RV PACING AMPLITUDE: 3 V
MDC IDC SET LEADCHNL RV SENSING SENSITIVITY: 0.6 mV
MDC IDC STAT BRADY RV PERCENT PACED: 0 %

## 2015-10-24 ENCOUNTER — Telehealth: Payer: Self-pay | Admitting: Cardiology

## 2015-10-24 NOTE — Telephone Encounter (Signed)
New Message  Pt requested to speak w/ RN about a referral to a PCP. Please call back and discuss.

## 2015-10-24 NOTE — Telephone Encounter (Signed)
Per pt - states wife told him to call to ask for a referral to a PCP.  Advised we generally don't make referrals to any specific PCP and he can establish with the MD of his choice whether that's someone close to home or work.  He states understanding and that he already has one in mind.  He will call them to establish.

## 2015-11-10 ENCOUNTER — Ambulatory Visit: Payer: PRIVATE HEALTH INSURANCE | Admitting: Cardiology

## 2015-11-10 DIAGNOSIS — R0989 Other specified symptoms and signs involving the circulatory and respiratory systems: Secondary | ICD-10-CM

## 2015-11-10 NOTE — Progress Notes (Deleted)
[name]. [address], [address] [place], [place]  [address] Phone: [phone] Fax:  [phone]  Date:  11/10/2015   ID:  Roger Martinez, DOB 1965/12/01, MRN [id number]  PCP:  No primary care provider on file.    transition of care visit following hospitalization  History of Present Illness: Roger Martinez is a 50 y.o. male with prior history of three-vessel bypass in 2011, coronary artery disease, hypertension here for follow-up.HFrEF. ICD placed. Dr. Lovena Le patient.  Patient previously left AMA from hospital on 04/07/13 with new diagnosis of systolic heart failure with ejection fraction of 30-35%. His EF has been repeated and was 20/25 percent. Because of this, defibrillator was placed. Issues with medical compliance.  He was a Administrator. 2 miles a day walking.   11/27/14 - Not taking Entesto but every 2-3 days. Same with other heart failure medications. Anxiety medication does not agree with him. Held lasix for several days. Every 3 days he is taking coreg. Feels anxious if takes daily. He thinks he has been eating more. He feels well however, no shortness of breath  03/18/15 - on 02/05/15 he presented to the emergency room and left AMA, saw Dr. condition he went to be admitted for rule out MI dilated cardiopathy, LVH best approach would be for diagnostic cath. He had substernal chest pain, right-sided, noncompliant with medications.   Really wants help to stop smoking. pets why he came today he states.  Really why he is here. Wants patch. Tried one prior to Thanksgiving. [employer], just singed up. he is not having any shortness of breath, no chest pain currently, no syncope. No orthopnea.   05/30/15-here for transition of care visit posthospitalization and discharge on 05/23/15, 7 days ago with accelerated hypertension, acute systolic heart failure, noncompliance. Hospital records reviewed. Lab work reviewed. Troponin mildly elevated consistent with demand ischemia, 0.14. Creatinine  1.06, hemoglobin 14.6. Original EKG on 05/21/15 demonstrated sinus tachycardia 125 bpm.  He is going to be establishing with a primary physician next Tuesday. His wife is helping him get this set up. We talked about fluid. He admits that he was drinking Gatorade before and after walking. This would not be a good idea for him given his cardiomyopathy. We discussed salt intake as well. This seemed to like new information to him. Telephone call made prior to this appointment and documented.  11/10/15 - Admitted on 09/08/15 for heart failure. Stopped meds for 5 days prior. Had the meds but due to financial stress, did not take them. Toponin was 0.24 (demand). Given 80 of IV lasix and DC'd the next day.   Wt Readings from Last 3 Encounters:  09/08/15 184 lb 3.2 oz (83.6 kg)  08/07/15 197 lb 12.8 oz (89.7 kg)  06/26/15 196 lb (88.9 kg)     Past Medical History:  Diagnosis Date  . Anxiety state, unspecified   . Chronic combined systolic (congestive) and diastolic (congestive) heart failure ([place])    a. 03/2014 Echo: EF 20-25%, diff HK, Gr 2 DD, mild AI, sev dil LA, mod reduced RV fxn, PASP 47mmHg.  Marland Kitchen Coronary artery disease    a. 2011 s/p CABG x 3.  . Hyperlipidemia   . Hypertensive heart disease with heart failure ([place])   . Ischemic cardiomyopathy    a. 03/2014 Echo: EF 20-25%;  b. 06/2014 s/p BSX single lead AICD, ser # [id number].  . Marijuana abuse   . MI (myocardial infarction) ([place]) 03/2011  . Noncompliance   .  STD (sexually transmitted disease)   . Tobacco abuse     Past Surgical History:  Procedure Laterality Date  . BYPASS GRAFT  2011   3 vessel  . EP IMPLANTABLE DEVICE  06/17/2014   DR [name]  . IMPLANTABLE CARDIOVERTER DEFIBRILLATOR IMPLANT N/A 06/17/2014   Procedure: IMPLANTABLE CARDIOVERTER DEFIBRILLATOR IMPLANT;  Surgeon: Deboraha Sprang, MD;  Location: [facility] CATH LAB;  Service: Cardiovascular;  Laterality: N/A;  . INCISION AND DRAINAGE PERIRECTAL ABSCESS N/A 06/13/2015   Procedure:  IRRIGATION AND DEBRIDEMENT PERIRECTAL ABSCESS;  Surgeon: Michael Boston, MD;  Location: [facility] ORS;  Service: General;  Laterality: N/A;    Current Outpatient Prescriptions  Medication Sig Dispense Refill  . aspirin EC 81 MG tablet Take 81 mg by mouth every 3 (three) days.     . carvedilol (COREG) 25 MG tablet Take 1 tablet (25 mg total) by mouth 2 (two) times daily with a meal. (Patient taking differently: Take 25 mg by mouth See admin instructions. Pt takes every 3 days. Prescribed twice daily) 60 tablet 3  . furosemide (LASIX) 40 MG tablet Take 1 tablet (40 mg total) by mouth daily. (Patient taking differently: Take 40 mg by mouth See admin instructions. Every 3 days) 30 tablet 3  . hydrALAZINE (APRESOLINE) 50 MG tablet Take 50 mg by mouth See admin instructions. Every 3 days    . lisinopril (PRINIVIL,ZESTRIL) 10 MG tablet Take 10 mg by mouth See admin instructions. Every 3 days    . lisinopril (PRINIVIL,ZESTRIL) 20 MG tablet Take 1 tablet (20 mg total) by mouth daily. (Patient taking differently: Take 20 mg by mouth See admin instructions. Every 3 days.) 30 tablet 3  . lovastatin (MEVACOR) 20 MG tablet Take 1 tablet (20 mg total) by mouth at bedtime. 30 tablet 3  . pantoprazole (PROTONIX) 40 MG tablet Take 1 tablet (40 mg total) by mouth every 3 (three) days. 30 tablet 3  . potassium chloride SA (K-DUR,KLOR-CON) 20 MEQ tablet Take 20 mEq by mouth See admin instructions. Every 3 days. Prescribed daily     No current facility-administered medications for this visit.     Allergies:   No Known Allergies  Social History:  The patient  reports that he has quit smoking. He smoked 0.00 packs per day for 20.00 years. He has never used smokeless tobacco. He reports that he uses drugs, including Marijuana. 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 Hypertension Mother     deceased.  . Diabetes Mother   . Other Father     alive - pt unaware  of medical hx.    ROS:  Please see the history of present illness.   Denies any syncope, bleeding, positive orthopnea, positive PND, no fevers, chills, rash   All other systems reviewed and negative.   PHYSICAL EXAM: VS:  There were no vitals taken for this visit. Gen.: Well nourished, well developed, in no acute distress  HEENT: normal, [place]/AT, EOMI Ext: no edema Skin: warm and dry, mild bruising left of sternum. Radial artery harvest site noted left arm CV: Regular rate and rhythm, no S3, no murmurs, minimal JVD Lungs: Clear to auscultation bilaterally no rales GU: deferred Neuro: no focal abnormalities noted, AAO x 3  EKG:  03/20/14-sinus bradycardia rate 56, T-wave inversion in precordial/inferior leads-no change from prior 05/08/13 :Sinus rhythm rate 80 with T-wave inversion through precordial leads as well as inferior leads, possible ischemia. No significant  change from prior.  Lab work, hospital records extensively reviewed. 3/16-creatinine 1.02, troponin normal, BNP 4500, LDL 118, hemoglobin 15.3  Echocardiogram:   05/08/15- Left ventricle: The cavity size was normal. Wall thickness was increased in a pattern of severe LVH. Systolic function was moderately to severely reduced. The estimated ejection fraction was in the range of 20-25%. Diffuse hypokinesis. - Aortic valve: Mild, moderate regurgitation. - Mitral valve: Mild regurgitation. - Left atrium: The atrium was moderately dilated. - Right atrium: The atrium was mildly dilated. - Pulmonary arteries: PA peak pressure: 59mm Hg (S).  ASSESSMENT AND PLAN:  History of coronary artery disease status post bypass surgery 3 in 2011 with chronic combined systolic heart failure EF 25-30% with hypertensive heart disease, ischemic cardiomyopathy status post ICD in March 2016, tobacco use, history of noncompliance, accelerated hypertension, THC use, with recent discharge from hospital on 05/23/15 as well as  09/10/15.  1. Cardiomyopathy/chronic systolic heart failure-EF 25%-30.  Admitted December 2014 as well as February 2017 and May 2017 with acute systolic heart failure. Noncompliance. Truck driver. Defibrillator in place. Teary-eyed at times in the past.. Low-salt diet. Fluid restriction. Thankfully, he feels better. NYHA 1-2. He was started on Entresto by Dr. [name] but could not afford (stopped). Try to encourage compliance however this is been an ongoing issue.  2. Coronary artery disease-post bypass/CABG in [facility].  No active anginal symptoms, right-sided chest pain previously in the emergency room, troponin was negative. He refused admission. 3. Essential hypertension- Continue to encourage medication use. Recent hospitalization demonstrated accelerated hypertension. Once he has been on medications, this improved. Lisinopril was titrated to 20 mg a day. Lasix. He did not take his medication she had this morning. 4. Nonsustained ventricular tachycardia noted during hospitalization. Beta blocker. ICD in place 5. Hyperlipidemia-was taking simvastatin 20 mg every other day. As he is having myalgias. He stopped taking it however. We can address this at future clinic visit, perhaps atorvastatin low-dose. 6.   Signed, Candee Furbish, MD [facility]  11/10/2015 6:35 AM

## 2015-11-19 ENCOUNTER — Encounter: Payer: Self-pay | Admitting: Cardiology

## 2015-11-21 ENCOUNTER — Inpatient Hospital Stay (HOSPITAL_COMMUNITY)
Admission: EM | Admit: 2015-11-21 | Discharge: 2015-11-23 | DRG: 280 | Disposition: A | Discharge status: Home / Self Care | Payer: PRIVATE HEALTH INSURANCE | Attending: Internal Medicine | Admitting: Internal Medicine

## 2015-11-21 ENCOUNTER — Encounter (HOSPITAL_COMMUNITY): Payer: Self-pay

## 2015-11-21 ENCOUNTER — Emergency Department (HOSPITAL_COMMUNITY): Payer: PRIVATE HEALTH INSURANCE

## 2015-11-21 DIAGNOSIS — Z7982 Long term (current) use of aspirin: Secondary | ICD-10-CM | POA: Diagnosis not present

## 2015-11-21 DIAGNOSIS — Z9114 Patient's other noncompliance with medication regimen: Secondary | ICD-10-CM | POA: Diagnosis not present

## 2015-11-21 DIAGNOSIS — I110 Hypertensive heart disease with heart failure: Secondary | ICD-10-CM | POA: Diagnosis present

## 2015-11-21 DIAGNOSIS — I5043 Acute on chronic combined systolic (congestive) and diastolic (congestive) heart failure: Secondary | ICD-10-CM | POA: Diagnosis present

## 2015-11-21 DIAGNOSIS — I252 Old myocardial infarction: Secondary | ICD-10-CM

## 2015-11-21 DIAGNOSIS — I5031 Acute diastolic (congestive) heart failure: Secondary | ICD-10-CM

## 2015-11-21 DIAGNOSIS — Z95810 Presence of automatic (implantable) cardiac defibrillator: Secondary | ICD-10-CM | POA: Diagnosis not present

## 2015-11-21 DIAGNOSIS — I161 Hypertensive emergency: Secondary | ICD-10-CM

## 2015-11-21 DIAGNOSIS — J9600 Acute respiratory failure, unspecified whether with hypoxia or hypercapnia: Secondary | ICD-10-CM | POA: Diagnosis present

## 2015-11-21 DIAGNOSIS — I509 Heart failure, unspecified: Secondary | ICD-10-CM

## 2015-11-21 DIAGNOSIS — F17210 Nicotine dependence, cigarettes, uncomplicated: Secondary | ICD-10-CM | POA: Diagnosis present

## 2015-11-21 DIAGNOSIS — E875 Hyperkalemia: Secondary | ICD-10-CM | POA: Diagnosis present

## 2015-11-21 DIAGNOSIS — I214 Non-ST elevation (NSTEMI) myocardial infarction: Secondary | ICD-10-CM

## 2015-11-21 DIAGNOSIS — F1290 Cannabis use, unspecified, uncomplicated: Secondary | ICD-10-CM | POA: Diagnosis present

## 2015-11-21 DIAGNOSIS — I2510 Atherosclerotic heart disease of native coronary artery without angina pectoris: Secondary | ICD-10-CM | POA: Diagnosis present

## 2015-11-21 DIAGNOSIS — I255 Ischemic cardiomyopathy: Secondary | ICD-10-CM | POA: Diagnosis present

## 2015-11-21 DIAGNOSIS — Z951 Presence of aortocoronary bypass graft: Secondary | ICD-10-CM

## 2015-11-21 DIAGNOSIS — N179 Acute kidney failure, unspecified: Secondary | ICD-10-CM | POA: Diagnosis present

## 2015-11-21 DIAGNOSIS — E876 Hypokalemia: Secondary | ICD-10-CM | POA: Diagnosis not present

## 2015-11-21 DIAGNOSIS — E785 Hyperlipidemia, unspecified: Secondary | ICD-10-CM | POA: Diagnosis present

## 2015-11-21 DIAGNOSIS — K219 Gastro-esophageal reflux disease without esophagitis: Secondary | ICD-10-CM | POA: Diagnosis present

## 2015-11-21 DIAGNOSIS — J811 Chronic pulmonary edema: Secondary | ICD-10-CM | POA: Diagnosis present

## 2015-11-21 DIAGNOSIS — I160 Hypertensive urgency: Secondary | ICD-10-CM | POA: Diagnosis present

## 2015-11-21 DIAGNOSIS — Z79899 Other long term (current) drug therapy: Secondary | ICD-10-CM | POA: Diagnosis not present

## 2015-11-21 DIAGNOSIS — J810 Acute pulmonary edema: Secondary | ICD-10-CM

## 2015-11-21 DIAGNOSIS — Z91148 Patient's other noncompliance with medication regimen for other reason: Secondary | ICD-10-CM

## 2015-11-21 DIAGNOSIS — R0602 Shortness of breath: Secondary | ICD-10-CM | POA: Diagnosis present

## 2015-11-21 DIAGNOSIS — Z720 Tobacco use: Secondary | ICD-10-CM | POA: Diagnosis present

## 2015-11-21 DIAGNOSIS — I10 Essential (primary) hypertension: Secondary | ICD-10-CM | POA: Diagnosis present

## 2015-11-21 DIAGNOSIS — F17200 Nicotine dependence, unspecified, uncomplicated: Secondary | ICD-10-CM

## 2015-11-21 LAB — BRAIN NATRIURETIC PEPTIDE: B NATRIURETIC PEPTIDE 5: 1919.9 pg/mL — AB (ref 0.0–100.0)

## 2015-11-21 LAB — CBC WITH DIFFERENTIAL/PLATELET
BASOS ABS: 0 10*3/uL (ref 0.0–0.1)
BASOS PCT: 0 %
EOS ABS: 0.3 10*3/uL (ref 0.0–0.7)
EOS PCT: 3 %
HCT: 50.6 % (ref 39.0–52.0)
Hemoglobin: 16.5 g/dL (ref 13.0–17.0)
LYMPHS PCT: 38 %
Lymphs Abs: 3.9 10*3/uL (ref 0.7–4.0)
MCH: 31.1 pg (ref 26.0–34.0)
MCHC: 32.6 g/dL (ref 30.0–36.0)
MCV: 95.5 fL (ref 78.0–100.0)
Monocytes Absolute: 0.6 10*3/uL (ref 0.1–1.0)
Monocytes Relative: 6 %
Neutro Abs: 5.4 10*3/uL (ref 1.7–7.7)
Neutrophils Relative %: 53 %
PLATELETS: 272 10*3/uL (ref 150–400)
RBC: 5.3 MIL/uL (ref 4.22–5.81)
RDW: 14.4 % (ref 11.5–15.5)
WBC: 10.3 10*3/uL (ref 4.0–10.5)

## 2015-11-21 LAB — BASIC METABOLIC PANEL
ANION GAP: 12 (ref 5–15)
Anion gap: 19 — ABNORMAL HIGH (ref 5–15)
BUN: 15 mg/dL (ref 6–20)
BUN: 16 mg/dL (ref 6–20)
CALCIUM: 9.5 mg/dL (ref 8.9–10.3)
CHLORIDE: 103 mmol/L (ref 101–111)
CO2: 17 mmol/L — ABNORMAL LOW (ref 22–32)
CO2: 26 mmol/L (ref 22–32)
Calcium: 9.3 mg/dL (ref 8.9–10.3)
Chloride: 105 mmol/L (ref 101–111)
Creatinine, Ser: 1.58 mg/dL — ABNORMAL HIGH (ref 0.61–1.24)
Creatinine, Ser: 1.25 mg/dL — ABNORMAL HIGH (ref 0.61–1.24)
GFR calc Af Amer: 58 mL/min — ABNORMAL LOW (ref >60)
GFR calc non Af Amer: >60 mL/min (ref >60)
GFR, EST NON AFRICAN AMERICAN: 50 mL/min — AB (ref >60)
Glucose, Bld: 208 mg/dL — ABNORMAL HIGH (ref 65–99)
Glucose, Bld: 83 mg/dL (ref 65–99)
POTASSIUM: 5.9 mmol/L — AB (ref 3.5–5.1)
POTASSIUM: 3.9 mmol/L (ref 3.5–5.1)
SODIUM: 141 mmol/L (ref 135–145)
SODIUM: 141 mmol/L (ref 135–145)

## 2015-11-21 LAB — TROPONIN I
TROPONIN I: 0.07 ng/mL — AB (ref <0.03)
TROPONIN I: 0.11 ng/mL — AB (ref <0.03)
TROPONIN I: 0.17 ng/mL — AB (ref <0.03)
TROPONIN I: 0.21 ng/mL — AB (ref <0.03)

## 2015-11-21 MED ORDER — ENSURE ENLIVE PO LIQD
237.0000 mL | ORAL | Status: DC
  Administered 2015-11-22: 237 mL via ORAL

## 2015-11-21 MED ORDER — PRAVASTATIN SODIUM 40 MG PO TABS
20.0000 mg | ORAL_TABLET | Freq: Every day | ORAL | Status: DC
  Administered 2015-11-21: 20 mg via ORAL
  Filled 2015-11-21: qty 1

## 2015-11-21 MED ORDER — NITROGLYCERIN IN D5W 200-5 MCG/ML-% IV SOLN
10.0000 ug/min | INTRAVENOUS | Status: DC
  Administered 2015-11-21: 10 ug/min via INTRAVENOUS
  Filled 2015-11-21: qty 250

## 2015-11-21 MED ORDER — FUROSEMIDE 10 MG/ML IJ SOLN
40.0000 mg | Freq: Two times a day (BID) | INTRAMUSCULAR | Status: DC
  Administered 2015-11-21 – 2015-11-22 (×3): 40 mg via INTRAVENOUS
  Filled 2015-11-21 (×3): qty 4

## 2015-11-21 MED ORDER — ENSURE ENLIVE PO LIQD: 237.0000 mL | Freq: Two times a day (BID) | ORAL | Status: DC

## 2015-11-21 MED ORDER — FUROSEMIDE 10 MG/ML IJ SOLN: 40.0000 mg | Freq: Two times a day (BID) | INTRAMUSCULAR | Status: DC

## 2015-11-21 MED ORDER — ONDANSETRON HCL 4 MG/2ML IJ SOLN
4.0000 mg | Freq: Once | INTRAMUSCULAR | Status: AC
  Administered 2015-11-21: 4 mg via INTRAVENOUS
  Filled 2015-11-21: qty 2

## 2015-11-21 MED ORDER — PANTOPRAZOLE SODIUM 40 MG PO TBEC
40.0000 mg | DELAYED_RELEASE_TABLET | Freq: Every day | ORAL | Status: DC
  Administered 2015-11-21 – 2015-11-23 (×3): 40 mg via ORAL
  Filled 2015-11-21 (×3): qty 1

## 2015-11-21 MED ORDER — FUROSEMIDE 10 MG/ML IJ SOLN
40.0000 mg | Freq: Once | INTRAMUSCULAR | Status: AC
  Administered 2015-11-21: 40 mg via INTRAVENOUS
  Filled 2015-11-21: qty 4

## 2015-11-21 MED ORDER — HYDRALAZINE HCL 50 MG PO TABS
50.0000 mg | ORAL_TABLET | Freq: Every day | ORAL | Status: DC
  Administered 2015-11-21: 50 mg via ORAL
  Filled 2015-11-21: qty 1

## 2015-11-21 MED ORDER — IBUPROFEN 600 MG PO TABS
600.0000 mg | ORAL_TABLET | Freq: Four times a day (QID) | ORAL | Status: DC | PRN
  Administered 2015-11-21 – 2015-11-22 (×2): 600 mg via ORAL
  Filled 2015-11-21 (×2): qty 1

## 2015-11-21 MED ORDER — ASPIRIN EC 81 MG PO TBEC
81.0000 mg | DELAYED_RELEASE_TABLET | Freq: Every day | ORAL | Status: DC
  Administered 2015-11-21 – 2015-11-23 (×3): 81 mg via ORAL
  Filled 2015-11-21 (×3): qty 1

## 2015-11-21 MED ORDER — CARVEDILOL 12.5 MG PO TABS
25.0000 mg | ORAL_TABLET | Freq: Two times a day (BID) | ORAL | Status: DC
  Administered 2015-11-21 – 2015-11-23 (×5): 25 mg via ORAL
  Filled 2015-11-21 (×5): qty 2

## 2015-11-21 MED ORDER — ACETAMINOPHEN 500 MG PO TABS
1000.0000 mg | ORAL_TABLET | Freq: Once | ORAL | Status: AC
  Administered 2015-11-21: 1000 mg via ORAL
  Filled 2015-11-21: qty 2

## 2015-11-21 MED ORDER — ENOXAPARIN SODIUM 40 MG/0.4ML ~~LOC~~ SOLN
40.0000 mg | SUBCUTANEOUS | Status: DC
  Administered 2015-11-21 – 2015-11-22 (×2): 40 mg via SUBCUTANEOUS
  Filled 2015-11-21: qty 0.4

## 2015-11-21 NOTE — ED Notes (Signed)
Patient was given a sprite in a cup of ice.

## 2015-11-21 NOTE — H&P (Signed)
Date: 11/21/2015               Patient Name:  Roger Martinez MRN: [id number]  DOB: 08-29-1965 Age / Sex: 50 y.o., male   PCP: No primary care provider on file.         Medical Service: Internal Medicine Teaching Service         Attending Physician: Dr. Axel Filler, MD    First Contact: Dr. Alphonzo Grieve Pager: [phone]  Second Contact: Dr. Dellia Nims Pager: [phone]       After Hours (After 5p/  First Contact Pager: [phone]  weekends / holidays): Second Contact Pager: [phone]   Chief Complaint: SOB  History of Present Illness: 67M w/ PMHx of tobacco use, CAD s/p CABG, CHF, hx of MI, ischemic cardiomyopathy w/ ICD, HTN, and HLD who presents with SOB. Pt was in his usual state of health before going to bed last night around 9:00pm. Then he woke up around 2:00am b/c he did not feel right. He denied any SOB at that time however with in 1 hour he developed acute SOB and attempted to drive to the hospital himself. While driving pt become so SOB and diaphoretic that he had to pull over to the side of the road and called 911. He reports that he has been compliant with his medications for the past 3 days. However prior to 3 days ago he would take his medications according to whether or not he felt he needed them at the time. He says that he usually takes his medications every 3 days. He admits to dietary indiscretion and had a high salt dinner w/ chips last night. He denies recent weight gain, sick contacts, chest pain, diarrhea, n/v. He is a current 1/2 PPD smoker. 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  On EMS arrival he was given 2 tabs of nitro although he denies any chest pain during this encounter. Per EMS he was hypotensive however on ED arrival he was noted to be in htn emergency w/ BP of 234/126 and was started on a nitro gtt and placed on BIPAP. BNP was elevated at 1919 and CXR revealed pulmonary edema. He was also given 40mg  of IV lasix. Currently  pt is on 2L [place] O2 and states he feels 98% back to his baseline.   Meds:  Current Meds  Medication Sig  . aspirin EC 81 MG tablet Take 81 mg by mouth daily.   . carvedilol (COREG) 25 MG tablet Take 1 tablet (25 mg total) by mouth 2 (two) times daily with a meal.  . furosemide (LASIX) 40 MG tablet Take 1 tablet (40 mg total) by mouth daily.  . hydrALAZINE (APRESOLINE) 50 MG tablet Take 50 mg by mouth daily. Every 3 days  . lisinopril (PRINIVIL,ZESTRIL) 20 MG tablet Take 1 tablet (20 mg total) by mouth daily.  Marland Kitchen lovastatin (MEVACOR) 20 MG tablet Take 1 tablet (20 mg total) by mouth at bedtime.  . pantoprazole (PROTONIX) 40 MG tablet Take 1 tablet (40 mg total) by mouth every 3 (three) days. (Patient taking differently: Take 40 mg by mouth daily. )  . potassium chloride SA (K-DUR,KLOR-CON) 20 MEQ tablet Take 20 mEq by mouth daily. Every 3 days. Prescribed daily  . [DISCONTINUED] lisinopril (PRINIVIL,ZESTRIL) 10 MG tablet Take 10 mg by mouth See admin instructions. Every 3 days     Allergies: Allergies as of 11/21/2015  . (No Known Allergies)   Past  Medical History:  Diagnosis Date  . Anxiety state, unspecified   . Chronic combined systolic (congestive) and diastolic (congestive) heart failure ([place])    a. 03/2014 Echo: EF 20-25%, diff HK, Gr 2 DD, mild AI, sev dil LA, mod reduced RV fxn, PASP 42mmHg.  Marland Kitchen Coronary artery disease    a. 2011 s/p CABG x 3.  . Hyperlipidemia   . Hypertensive heart disease with heart failure ([place])   . Ischemic cardiomyopathy    a. 03/2014 Echo: EF 20-25%;  b. 06/2014 s/p BSX single lead AICD, ser # [id number].  . Marijuana abuse   . MI (myocardial infarction) ([place]) 03/2011  . Noncompliance   . STD (sexually transmitted disease)   . Tobacco abuse     Family History: mom- htn, DM. Father: unknown  Social History: Lives w/ wife. 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 Smokes marijuana, last  used it last night and smoked more than usual. Smokes 1/2 PPD of cigarettes x 20 years, unable to quit due to stress.   Review of Systems: A complete ROS was negative except as per HPI.   Physical Exam: Blood pressure (!) 155/105, pulse 63, resp. rate 17, SpO2 98 %. Physical Exam  Constitutional: He appears well-developed and well-nourished. No distress.  HENT:  Head: Normocephalic and atraumatic.  Nose: Nose normal.  Cardiovascular: Normal rate, regular rhythm and normal heart sounds.  Exam reveals no gallop and no friction rub.   No murmur heard. Pulmonary/Chest: Effort normal and breath sounds normal. No respiratory distress. He has no wheezes. He has no rales.  Abdominal: Soft. Bowel sounds are normal. He exhibits no distension and no mass. There is no tenderness. There is no rebound and no guarding.  Musculoskeletal: He exhibits no edema.  Skin: Skin is warm and dry. No rash noted. He is not diaphoretic. No erythema. No pallor.     EKG Interpretation  Date/Time:  Friday November 21 2015 04:31:08 EDT Ventricular Rate:  106 PR Interval:    QRS Duration: 114 QT Interval:  367 QTC Calculation: 488 R Axis:   119 Text Interpretation:  Sinus tachycardia Biatrial enlargement Borderline intraventricular conduction delay Abnormal R-wave progression, late transition Borderline T abnormalities, inferior leads Borderline prolonged QT interval Abnormal ekg No significant change since last tracing Confirmed by Christy Gentles  MD, Elenore Rota ([id number]) on 11/21/2015 4:35:14 AM Also confirmed by Christy Gentles  MD, DONALD ([id number]), editor [place] CT, Leda Gauze [phone])  on 11/21/2015 7:15:48 AM   CXR: Vascular congestion and mild cardiomegaly. Increased interstitial markings raise concern for pulmonary edema. Small left pleural effusion noted.  Assessment & Plan by Problem: Principal Problem:   Pulmonary edema Active Problems:   H/O medication noncompliance   CAD, multiple vessel s/p CABG 2011    Tobacco abuse  disorder   Accelerated hypertension   HLD (hyperlipidemia)   Acute CHF (congestive heart failure) (HCC)   Acute CHF exacerbation-- ECHO in 04/2015 w/ EF of 25-30% w/ grade 2 diastolic dysfunction. pt presenting w/ acute SOB that started last night, noted on CXR to have pulmonary edema, elevated BNP of 1919, and crackles on lung exam noted by ED provider. He initially required BIPAP but is now sating well on 2L [place] and speaking in full sentences. Likely trigger for CHF exacerbation is diet and medication non compliance. Could also be an arrhythmia as he has dilated ischemic cardiomyopathy. Although could be cardiac ischemia however he denies any  chest pain. His troponin was 0.07 in the ED but likely in the setting of demand supply ischemia from CHF exacerbation. EKG is negative for signs of ischemia.  - admit to tele - daily weights, last weighed 184lbs 3 months ago.  - strict I/Os - lasix 40mg  IV BID - weaned off nitro gtt - continue home asa, coreg, and hydralazine. - O2 to maintain O2 sats >92% - BMET at noon - repeat troponin to ensure it trends down at 1000 am  HTN emergency-- on ED arrival pt's BP was severely elevated requiring nitro gtt. BP has improved to 155/105 on exam. Likely due to decreased EF in setting of acute CHF exacerbation. Pt having a headache likely from nitro gtt. Per chart review pt has been non compliant with meds with elevated BP up to 200/120 noted in outpatient clinic visits.  - wean nitro gtt off - continue home coreg and hydralazine - resume lisinopril if creatinine goes down as it was elevated at 1.58 from 1.44 3 months ago - ibuprofen 600mg  prn for HA  Hyperkalemia-- K 5.9 in presentation, he is on KDur 40mEq q3 days at home. There were no T wave changes on EKG. He was given IV lasix in the ED which likely will bring his K down to nl level0. - repeat BMET at noon, if K still elevated can repeat EKG and consider kayexalate although he will be getting another dose  of lasix IV at 1800.   AKI-- Creatinine 1.58, b/l around 1.0. Likely cardiorenal.  - follow BMET, holding ACEinh  HLD--LDL 118 in 2014. Per cards he was intolerant of simvastatin. Continue pravastatin 20mg .  - fasting lipid panel in the am  DVT ppx -- lovenox Diet -- hh   Dispo: Admit patient to Inpatient with expected length of stay greater than 2 midnights.  Signed: Norman Herrlich, MD 11/21/2015, 8:38 AM  Pager: [phone]

## 2015-11-21 NOTE — ED Notes (Signed)
Family at bedside. 

## 2015-11-21 NOTE — ED Provider Notes (Signed)
Pt is supposed to go to internal medicine teaching service Report called to resident    Ripley Fraise, MD 11/21/15 [phone]

## 2015-11-21 NOTE — Progress Notes (Signed)
RT NOTE:  Pt is currently off BIPAP and wearing 2L [place]. Pt tolerating well @ this time. RT continues to monitor.

## 2015-11-21 NOTE — ED Notes (Signed)
Admitting at bedside 

## 2015-11-21 NOTE — Progress Notes (Signed)
Bipap mask sent with pt to 3E15 in case he would need it later. Report called to receiving RT. Pt stable at this time on 2L [place]. No need for bipap currently. RT will continue to monitor.

## 2015-11-21 NOTE — ED Triage Notes (Signed)
Pt comes via [facility], for SOB, fluid in lungs, speaks in one word sentences. Pt not tolerating CPAP with EMS. PTA received 2 nitro, pt very diaphoretic.

## 2015-11-21 NOTE — ED Notes (Signed)
Per admitting turn nitro drip to 5 mcg/min and then d/c after 30 mins.

## 2015-11-21 NOTE — ED Provider Notes (Signed)
[facility] DEPT Provider Note   CSN: [id number] Arrival date & time: 11/21/15  [id number]  First Provider Contact:  First MD Initiated Contact with Patient 11/21/15 0434        History   Chief Complaint Chief Complaint  Patient presents with  . Respiratory Distress  LEVEL 5 CAVEAT DUE TO RESPIRATORY DISTRESS  HPI Roger Martinez is a 50 y.o. male.  The history is provided by the patient and the EMS personnel. The history is limited by the condition of the patient.  Shortness of Breath  This is a new problem. Duration: JUST PRIOR TO ARRIVAL. The problem has been rapidly worsening.   Patient presents via EMS in respiratory distress Pt with h/o CHF, CAD EMS reports he was found outside apartment in distress He was placed on CPAP but did not tolerate this He became agitated Per EMS he was hypotensive No other details are known Past Medical History:  Diagnosis Date  . Anxiety state, unspecified   . Chronic combined systolic (congestive) and diastolic (congestive) heart failure ([place])    a. 03/2014 Echo: EF 20-25%, diff HK, Gr 2 DD, mild AI, sev dil LA, mod reduced RV fxn, PASP 36mmHg.  Marland Kitchen Coronary artery disease    a. 2011 s/p CABG x 3.  . Hyperlipidemia   . Hypertensive heart disease with heart failure ([place])   . Ischemic cardiomyopathy    a. 03/2014 Echo: EF 20-25%;  b. 06/2014 s/p BSX single lead AICD, ser # [id number].  . Marijuana abuse   . MI (myocardial infarction) ([place]) 03/2011  . Noncompliance   . STD (sexually transmitted disease)   . Tobacco abuse     Patient Active Problem List   Diagnosis Date Noted  . Acute CHF (congestive heart failure) ([place]) 09/07/2015  . Perianal abscess s/p I&D 06/13/2015 06/13/2015  . Perirectal abscess 06/13/2015  . Hypertensive urgency 05/22/2015  . Acute congestive heart failure ([place])   . Acute on chronic combined systolic and diastolic congestive heart failure, NYHA class 3 ([place]) 05/21/2015  . Tobacco abuse disorder 05/07/2015  . Chronic  combined systolic (congestive) and diastolic (congestive) heart failure ([place])   . Hypertensive heart disease with heart failure ([place])   . Ischemic cardiomyopathy 06/17/2014  . H/O medication noncompliance 04/05/2013  . CAD, multiple vessel s/p CABG 2011  04/05/2013  . Arteriosclerosis of coronary artery 03/20/2010  . Cannot sleep 03/18/2010  . Accelerated hypertension 03/16/2010  . HLD (hyperlipidemia) 03/13/2010    Past Surgical History:  Procedure Laterality Date  . BYPASS GRAFT  2011   3 vessel  . EP IMPLANTABLE DEVICE  06/17/2014   DR [name]  . IMPLANTABLE CARDIOVERTER DEFIBRILLATOR IMPLANT N/A 06/17/2014   Procedure: IMPLANTABLE CARDIOVERTER DEFIBRILLATOR IMPLANT;  Surgeon: Deboraha Sprang, MD;  Location: [facility] CATH LAB;  Service: Cardiovascular;  Laterality: N/A;  . INCISION AND DRAINAGE PERIRECTAL ABSCESS N/A 06/13/2015   Procedure: IRRIGATION AND DEBRIDEMENT PERIRECTAL ABSCESS;  Surgeon: Michael Boston, MD;  Location: [facility] ORS;  Service: General;  Laterality: N/A;       Home Medications    Prior to Admission medications   Medication Sig Start Date End Date Taking? Authorizing Provider  aspirin EC 81 MG tablet Take 81 mg by mouth every 3 (three) days.     Historical Provider, MD  carvedilol (COREG) 25 MG tablet Take 1 tablet (25 mg total) by mouth 2 (two) times daily with a meal. Patient taking differently: Take 25 mg by mouth See admin instructions. Pt takes every 3 days.  Prescribed twice daily 06/23/15   Burtis Junes, NP  furosemide (LASIX) 40 MG tablet Take 1 tablet (40 mg total) by mouth daily. Patient taking differently: Take 40 mg by mouth See admin instructions. Every 3 days 06/23/15   Burtis Junes, NP  hydrALAZINE (APRESOLINE) 50 MG tablet Take 50 mg by mouth See admin instructions. Every 3 days    Historical Provider, MD  lisinopril (PRINIVIL,ZESTRIL) 10 MG tablet Take 10 mg by mouth See admin instructions. Every 3 days    Historical Provider, MD  lisinopril  (PRINIVIL,ZESTRIL) 20 MG tablet Take 1 tablet (20 mg total) by mouth daily. Patient taking differently: Take 20 mg by mouth See admin instructions. Every 3 days. 06/14/15   Michael Boston, MD  lovastatin (MEVACOR) 20 MG tablet Take 1 tablet (20 mg total) by mouth at bedtime. 09/08/15   [name], MD  pantoprazole (PROTONIX) 40 MG tablet Take 1 tablet (40 mg total) by mouth every 3 (three) days. 08/07/15   Liliane Shi, PA-C  potassium chloride SA (K-DUR,KLOR-CON) 20 MEQ tablet Take 20 mEq by mouth See admin instructions. Every 3 days. Prescribed daily    Historical Provider, MD    Family History Family History  Problem Relation Age of Onset  . Hypertension Mother     deceased.  . Diabetes Mother   . Other Father     alive - pt unaware of medical hx.    Social History Social History  Substance Use Topics  . Smoking status: Former Smoker    Packs/day: 0.00    Years: 20.00  . Smokeless tobacco: Never Used  . Alcohol use No     Allergies   Review of patient's allergies indicates no known allergies.   Review of Systems Review of Systems  Unable to perform ROS: Severe respiratory distress  Respiratory: Positive for shortness of breath.      Physical Exam Updated Vital Signs BP (!) 234/126   Pulse (!) 123   Resp (!) 27   SpO2 98%   Physical Exam  CONSTITUTIONAL: Agitated, diaphoretic, distress noted HEAD: Normocephalic/atraumatic EYES: EOMI ENMT: Mucous membranes moist NECK: supple no meningeal signs SPINE/BACK:entire spine nontender CV: tachycardic, no loud murmurs noted LUNGS: tachypneic, distress noted, crackles bilaterally ABDOMEN: soft, nontender GU:no cva tenderness NEURO: Pt is awake/alert/appropriate, moves all extremitiesx4.  EXTREMITIES: pulses normal/equal, full ROM, no LE edema SKIN: diaphoretic PSYCH: agitated  ED Treatments / Results  Labs (all labs ordered are listed, but only abnormal results are displayed) Labs Reviewed  BASIC METABOLIC  PANEL - Abnormal; Notable for the following:       Result Value   Potassium 5.9 (*)    CO2 17 (*)    Glucose, Bld 208 (*)    Creatinine, Ser 1.58 (*)    GFR calc non Af Amer 50 (*)    GFR calc Af Amer 58 (*)    Anion gap 19 (*)    All other components within normal limits  TROPONIN I - Abnormal; Notable for the following:    Troponin I 0.07 (*)    All other components within normal limits  BRAIN NATRIURETIC PEPTIDE - Abnormal; Notable for the following:    B Natriuretic Peptide 1,919.9 (*)    All other components within normal limits  CBC WITH DIFFERENTIAL/PLATELET    EKG  EKG Interpretation  Date/Time:  Friday November 21 2015 04:31:08 EDT Ventricular Rate:  106 PR Interval:    QRS Duration: 114 QT Interval:  367 QTC  Calculation: 488 R Axis:   119 Text Interpretation:  Sinus tachycardia Biatrial enlargement Borderline intraventricular conduction delay Abnormal R-wave progression, late transition Borderline T abnormalities, inferior leads Borderline prolonged QT interval Abnormal ekg No significant change since last tracing Confirmed by Christy Gentles  MD, Takeisha Cianci ([id number]) on 11/21/2015 4:35:14 AM       Radiology Dg Chest Port 1 View  Result Date: 11/21/2015 CLINICAL DATA:  Acute onset of shortness of breath. Initial encounter. EXAM: PORTABLE CHEST 1 VIEW COMPARISON:  Chest radiograph performed 09/07/2015 FINDINGS: Vascular congestion is noted. Increased interstitial markings raise concern for pulmonary edema. A small left pleural effusion is noted. No pneumothorax seen. The cardiomediastinal silhouette is mildly enlarged. The patient is status post median sternotomy, with evidence of prior CABG. An AICD is noted overlying the left chest wall, with a single lead ending overlying the right ventricle. No acute osseous abnormalities are seen. IMPRESSION: Vascular congestion and mild cardiomegaly. Increased interstitial markings raise concern for pulmonary edema. Small left pleural effusion  noted. Electronically Signed   By: Garald Balding M.D.   On: 11/21/2015 05:04    Procedures Procedures  CRITICAL CARE Performed by: Sharyon Cable Total critical care time: 33 minutes Critical care time was exclusive of separately billable procedures and treating other patients. Critical care was necessary to treat or prevent imminent or life-threatening deterioration. Critical care was time spent personally by me on the following activities: development of treatment plan with patient and/or surrogate as well as nursing, discussions with consultants, evaluation of patient's response to treatment, examination of patient, obtaining history from patient or surrogate, ordering and performing treatments and interventions, ordering and review of laboratory studies, ordering and review of radiographic studies, pulse oximetry and re-evaluation of patient's condition. PATIENT WITH ACUTE RESPIRATORY DISTRESS REQUIRING BIPAP AS WELL AS NITROGLYCERIN DRIP  Medications Ordered in ED Medications  nitroGLYCERIN 50 mg in dextrose 5 % 250 mL (0.2 mg/mL) infusion (15 mcg/min Intravenous Rate/Dose Change 11/21/15 0452)  furosemide (LASIX) injection 40 mg (40 mg Intravenous Given 11/21/15 0437)  acetaminophen (TYLENOL) tablet 1,000 mg (1,000 mg Oral Given 11/21/15 0443)  ondansetron (ZOFRAN) injection 4 mg (4 mg Intravenous Given 11/21/15 0503)     Initial Impression / Assessment and Plan / ED Course  I have reviewed the triage vital signs and the nursing notes.  Pertinent labs & imaging results that were available during my care of the patient were reviewed by me and considered in my medical decision making (see chart for details).  Clinical Course    Pt seen on arrival He was very agitated on arrival, but did respond to [place] after arrival He is awake/alert He appears improved He is hypertensive Suspect acute pulmonary edema NTG drip and lasix ordered He will need admission 7:30 AM PT  IMPROVED HE IS DIURESING HE IS NOW ON OXYGEN AND OFF BIPAP AND TOLERATING WELL WILL ADMIT D/W [facility] FOR ADMISSION TO STEPDOWN UNIT  Final Clinical Impressions(s) / ED Diagnoses   Final diagnoses:  Acute pulmonary edema (HCC)  Acute respiratory failure, unspecified whether with hypoxia or hypercapnia [facility])  Hypertensive emergency    New Prescriptions New Prescriptions   No medications on file     Ripley Fraise, MD 11/21/15 [phone]

## 2015-11-21 NOTE — ED Notes (Signed)
Report attempted nurse to call back.  

## 2015-11-21 NOTE — Progress Notes (Signed)
Initial Nutrition Assessment   INTERVENTION:  Provided and discussed "Low Sodium Nutrition Therapy" handout from the Academy of Nutrition and Dietetics Provide Ensure Enlive po once daily, each supplement provides 350 kcal and 20 grams of protein   NUTRITION DIAGNOSIS:   Inadequate oral intake related to poor appetite as evidenced by per patient/family report, moderate depletions of muscle mass.   GOAL:   Patient will meet greater than or equal to 90% of their needs   MONITOR:   PO intake, Supplement acceptance, Labs, I & O's  REASON FOR ASSESSMENT:   Malnutrition Screening Tool    ASSESSMENT:   62M w/ PMHx of tobacco use, CAD s/p CABG, CHF, hx of MI, ischemic cardiomyopathy w/ ICD, HTN, and HLD who presents with SOB.   Pt reports having a poor appetite and eating 50% less than usual for the past several months. He states that he was also trying to lose weight and quit smoking so, he has been walking 1.6 miles each day. He states that he used to eat 2 meals per day and has only been eating 1 meal/day. Per weight history pt has lost from 197 to 188 lbs in the past 2-3 months- 5% wt loss is not significant for time frame. Per nursing notes pt ate 100% of a late breakfast. Lunch tray at bedside was mostly uneaten which pt states was due to not liking the type of lettuce. RD brought patient crackers, peanut butter and an apple.  Pt states that he received education on a low sodium diet before but "it didn't stick. He reports eating at [facility] and [employer] frequently. Provided and discussed "Low Sodium Nutrition Therapy" handout from the Academy of Nutrition and Dietetics. Reviewed major points: eat more fresh foods and less processed foods, don't add salt to food, be careful when eating out, cook at home more, check nutrition labels when shopping. Pt sleep at time of visit and losing interest. RD name and contact information provided; will follow-up Monday if patient still  admitted.   Labs reviewed.   Diet Order:  Diet Heart Room service appropriate? Yes; Fluid consistency: Thin  Skin:  Reviewed, no issues  Last BM:  8/10  Height:   Ht Readings from Last 1 Encounters:  11/21/15 5\' 11"  (1.803 m)    Weight:   Wt Readings from Last 1 Encounters:  11/21/15 188 lb 1.6 oz (85.3 kg)    Ideal Body Weight:  78.12 kg  BMI:  Body mass index is 26.23 kg/m.  Estimated Nutritional Needs:   Kcal:  2200-2400  Protein:  85-95 grams  Fluid:  2.4 L/day  EDUCATION NEEDS:   Education needs addressed  Scarlette Ar RD, LDN Inpatient Clinical Dietitian Pager: [phone] After Hours Pager: [phone]

## 2015-11-21 NOTE — Progress Notes (Signed)
RT NOTE:  EMS brought patient in on CPAP. Pt had removed mask when they entered trauma bay. Pt in tripod position, diaphoretic, unable to answer questions. RT placed pt on BIPAP 14/5, 40% and patient immediately showed relief. Pt currently tolerating BIPAP @ this time. RT monitoring.

## 2015-11-22 DIAGNOSIS — Z596 Low income: Secondary | ICD-10-CM

## 2015-11-22 DIAGNOSIS — Z7389 Other problems related to life management difficulty: Secondary | ICD-10-CM

## 2015-11-22 DIAGNOSIS — I110 Hypertensive heart disease with heart failure: Principal | ICD-10-CM

## 2015-11-22 DIAGNOSIS — N179 Acute kidney failure, unspecified: Secondary | ICD-10-CM

## 2015-11-22 DIAGNOSIS — J9600 Acute respiratory failure, unspecified whether with hypoxia or hypercapnia: Secondary | ICD-10-CM

## 2015-11-22 DIAGNOSIS — Z9119 Patient's noncompliance with other medical treatment and regimen: Secondary | ICD-10-CM

## 2015-11-22 DIAGNOSIS — I214 Non-ST elevation (NSTEMI) myocardial infarction: Secondary | ICD-10-CM

## 2015-11-22 DIAGNOSIS — K219 Gastro-esophageal reflux disease without esophagitis: Secondary | ICD-10-CM

## 2015-11-22 DIAGNOSIS — I5043 Acute on chronic combined systolic (congestive) and diastolic (congestive) heart failure: Secondary | ICD-10-CM

## 2015-11-22 DIAGNOSIS — E785 Hyperlipidemia, unspecified: Secondary | ICD-10-CM

## 2015-11-22 LAB — BASIC METABOLIC PANEL
ANION GAP: 10 (ref 5–15)
BUN: 14 mg/dL (ref 6–20)
CHLORIDE: 103 mmol/L (ref 101–111)
CO2: 27 mmol/L (ref 22–32)
Calcium: 9.2 mg/dL (ref 8.9–10.3)
Creatinine, Ser: 1.14 mg/dL (ref 0.61–1.24)
GFR calc non Af Amer: >60 mL/min (ref >60)
GLUCOSE: 103 mg/dL — AB (ref 65–99)
Potassium: 3 mmol/L — ABNORMAL LOW (ref 3.5–5.1)
Sodium: 140 mmol/L (ref 135–145)

## 2015-11-22 LAB — HIV ANTIBODY (ROUTINE TESTING W REFLEX): HIV Screen 4th Generation wRfx: NONREACTIVE

## 2015-11-22 LAB — LIPID PANEL
CHOL/HDL RATIO: 3.5 ratio
Cholesterol: 173 mg/dL (ref 0–200)
HDL: 50 mg/dL (ref >40)
LDL CALC: 110 mg/dL — AB (ref 0–99)
TRIGLYCERIDES: 67 mg/dL (ref <150)
VLDL: 13 mg/dL (ref 0–40)

## 2015-11-22 LAB — MAGNESIUM: Magnesium: 2 mg/dL (ref 1.7–2.4)

## 2015-11-22 LAB — TROPONIN I: Troponin I: 0.21 ng/mL (ref <0.03)

## 2015-11-22 MED ORDER — LISINOPRIL 20 MG PO TABS
20.0000 mg | ORAL_TABLET | Freq: Every day | ORAL | Status: DC
  Administered 2015-11-22 – 2015-11-23 (×2): 20 mg via ORAL
  Filled 2015-11-22 (×2): qty 1

## 2015-11-22 MED ORDER — POTASSIUM CHLORIDE 10 MEQ/100ML IV SOLN
INTRAVENOUS | Status: AC
  Filled 2015-11-22: qty 100

## 2015-11-22 MED ORDER — POTASSIUM CHLORIDE CRYS ER 20 MEQ PO TBCR
40.0000 meq | EXTENDED_RELEASE_TABLET | Freq: Once | ORAL | Status: AC
  Administered 2015-11-22: 40 meq via ORAL
  Filled 2015-11-22: qty 2

## 2015-11-22 MED ORDER — FUROSEMIDE 40 MG PO TABS
40.0000 mg | ORAL_TABLET | Freq: Every day | ORAL | Status: DC
  Administered 2015-11-23: 40 mg via ORAL
  Filled 2015-11-22: qty 1

## 2015-11-22 MED ORDER — PRAVASTATIN SODIUM 40 MG PO TABS
40.0000 mg | ORAL_TABLET | Freq: Every day | ORAL | Status: DC
  Administered 2015-11-22: 40 mg via ORAL
  Filled 2015-11-22: qty 1

## 2015-11-22 MED ORDER — POTASSIUM CHLORIDE 10 MEQ/100ML IV SOLN
10.0000 meq | INTRAVENOUS | Status: AC
  Administered 2015-11-22 (×4): 10 meq via INTRAVENOUS
  Filled 2015-11-22 (×3): qty 100

## 2015-11-22 NOTE — Progress Notes (Signed)
Subjective: Patient denies further shortness of breath, also denies chest pain, palpitations, abd pain, nausea or headache.   Patient expresses a lot of frustration with his heart problems as he watched his mother die from heart failure as well. He states he feels that he is overwhelmed with the amount of changes he needs to make to manage his heart failure.  Patient agrees to first committing to taking medications as prescribed will be an easier change than medications plus lifestyle change. Every attempt at consolidating and simplifying his medication regimen will be made as applicable.   Objective:  Vital signs in last 24 hours: Vitals:   11/21/15 2030 11/22/15 0031 11/22/15 0619 11/22/15 1214  BP: (!) 162/95 (!) 143/95 (!) 177/102 (!) 159/91  Pulse: (!) 53 (!) 51 (!) 54 69  Resp: 18 18 18 20   Temp: 99.3 F (37.4 C) 98.6 F (37 C) 98.7 F (37.1 C) 98.6 F (37 C)  TempSrc: Oral Oral Oral Oral  SpO2: 97% 96% 97% 98%  Weight:   84 kg (185 lb 3.2 oz)   Height:       Constitutional: NAD, lying comfortably CV: RRR, distant heart sounds, no murmurs, rubs or gallops Resp: CTAB, no increased work of breathing, no wheezing or crackles Abd: soft, +BS, nondistended, nontender Ext: warm, nonedematous, 2+pulses throughout Skin: warm, dry, sternotomy scar  Assessment/Plan:  Active Problems:   H/O medication noncompliance   CAD, multiple vessel s/p CABG 2011    Tobacco abuse disorder   Hypertensive emergency   HLD (hyperlipidemia)   Acute CHF (congestive heart failure) (HCC)   Acute respiratory failure (HCC)   NSTEMI (non-ST elevated myocardial infarction) (HCC)   GERD (gastroesophageal reflux disease)   Acute kidney injury (HCC)  Acute CHF exacerbation-- ECHO in 04/2015 w/ EF of 25-30% w/ grade 2 diastolic dysfunction. On admission, patient presented w/ acute SOB that started DOA night, noted on CXR to have pulmonary edema, elevated BNP of 1919, and crackles on lung exam noted by  ED provider. He initially required BIPAP but has been weaned down; he is currently on RA. Likely trigger for CHF exacerbation is diet and medication non compliance. Could also be an arrhythmia as he has dilated ischemic cardiomyopathy. His troponin was 0.07 in the ED but likely in the setting of demand supply ischemia from CHF exacerbation, f/u troponins peaked at 0.21. EKG is negative for signs of ischemia.  -- tele -- daily weights, last weighed 184lbs 3 months ago, currently at 185 -- strict I/Os; net output 2.7L since admission -- lasix 40mg  IV BID; switch to PO 40mg  lasix  -- weaned off nitro gtt -- continue home asa, coreg, restart lisinopril 20mg ; d/c hydralazine -- will have HF counselor speak to pt today  HTN emergency-- on ED arrival pt's BP was severely elevated requiring nitro gtt. BP has improved to 155/105 on exam. Likely due to decreased EF in setting of acute CHF exacerbation. Pt complained of headache on admission likely from nitro gtt. Per chart review pt has been non compliant with meds with elevated BP up to 200/120 noted in outpatient clinic visits. He has remained hypertensive despite coreg and hydralazine, but we will restart his lisinopril  --nitro gtt off --continue home coreg 25 bid; lisinopril 20mg  daily;  --resume lisinpril as Cr has trended down to 1.14 --ibuprofen 600mg  prn for HA  Hyperkalemia> Hypokalemia-- K 5.9 in presentation, he is on KDur 35mEq q3 days at home. There were no T wave changes on EKG.  He was given IV lasix which has lowered the potassium down to 3.0 this AM. --Replace K with KDur 49mEq PO, IV KCl 29mEq x4 --monitor  AKI-- Creatinine 1.58, b/l around 1.0. Likely cardiorenal since it has improved with IV Lasix. Currently 1.14.  --Will restart lisinopril  HLD--LDL 118 in 2014. Per cards he was intolerant of simvastatin, at home on pravastatin 20mg . --Fasting lipids: LDL 110 --Increase to pravastatin 40mg , monitor for side-effects  GERD:  Patient has a reported history of GERD on pantoprazole 40mg  daily, but he takes is as needed. States he still has breakthrough symptoms of reflux that he treats with nitroglycerin, but takes pantoprazole as needed for indigestion. --continue pantoprazole 40mg  Daily --provided education to patient about proper administration of PPI's   Dispo: Anticipated discharge in approximately 1 day.   Alphonzo Grieve, MD 11/22/2015, 12:24 PM Pager: [phone]

## 2015-11-22 NOTE — Progress Notes (Signed)
Earlier today I was informed that pt was very agitated. Went in room, pt had taken off the cardiac monitor and thrown it to the floor. Monitor no longer functioning. I spent itme talking with pt- he verbalizes frustration with his medical condition and home life. He stated that he was going to leave-pulled off leads. He sat down and cried and then calmed down and agreed to stay. Dr Posey Pronto went in to speak to him. Continues to be calm and cooperative at this time

## 2015-11-22 NOTE — Progress Notes (Signed)
Paged by RN about patient wanting to leave AMA. Upon speaking with the patient and his wife, he expressed frustration with his current health situation and feeling like he can never regain normal function. He proceeded to elaborate about his many financial obligations and stress at home. He agrees to stay through today though is tired of feeling as though his heart failure is not improving. He feels his strong will and family have helped him come this far though he is weary of the burden he treads with.   I offered support and encouragement. I recommended chaplain consult though he did not feel it would be helpful. He and wife appreciated the time I took to talk to him and hear his story.

## 2015-11-22 NOTE — Progress Notes (Signed)
  Date: 11/22/2015  Patient name: Roger Martinez  Medical record number: [id number]  Date of birth: [birth year]   I have seen and evaluated Roger Martinez and discussed their care with the Residency Team. Roger Martinez has CAD and is s/p CABG, dilated ischemic cardiomyopathy with EF 25-30%, s/p placement of ICD, and sig life stressors. Pt is not compliant with meds 2/2 cost, lack of food, and reported SE. He had been taking every three days but started taking QD for past 3 days. Developed sig dyspnea and felt like he couldn't get breathe and came to ED. Was on bipap but responded to 40 IV lasix.   He is no longer working since he developed heart disease. He has no income and has trouble affording meds. Cards has encouraged him to get a PCP and he has apet on 9/6 with [facility]. He watched his mother die from DM complications inc requiring amputations and also from HF. He feels that he is already down that path and the course cannot be altered. He is overwhelmed with his medical illnesses, medications, medical costs, lack of job, anxiety., stress....  PMHx, Fam Hx, and/or Soc Hx : Married, no longer able to work. financial challenges. Mother died with HF and DM complications inc amputations.   Vitals:   11/22/15 0619 11/22/15 1214  BP: (!) 177/102 (!) 159/91  Pulse: (!) 54 69  Resp: 18 20  Temp: 98.7 F (37.1 C) 98.6 F (37 C)   Pt viably upset and tearful and in distress. Team will return later for exam.  I personally viewed his CXR images and confirmed by reading with the official read. CM, edema, ICD in L chest  I personally viewed his EKG and confirmed by reading with the official read. Sinus, RAD, B atrial enlargement  Assessment and Plan: I have seen and evaluated the patient as outlined above. I agree with the formulated Assessment and Plan as detailed in the residents' note, with the following changes:   1. Acute respiratory failure 2/2 acute on chronic ischemic systolic and  diastolic HF - he responded well to Bipap and lasix. He is now euvolemic after diuresing 2.3 L and is down 3 lbs. He  Will be restarted on his BB, ACEI, statin (change to higher intensity), and ASA. Lasix PO will also be Rx'd and titrated for vol status. We will not resume his hydral - BP high but never on the  BB and ACEI QD to see if they can control his BP and we need to simplify his regimen for cost and QD dosing.   2. Poor coping / overwhelming medical issues - support provided at bedside. We encouraged him to F/U with his PCP and to focus now on just taking the meds. If he is unable to ownership of his medical issues and tackle them head on, he will never achieve a stable medical condition and will have a poor outcome. We will see if cardiac rehab can talk to pt.  Likely d/C 13th  Roger Crews, MD 8/12/20171:14 PM

## 2015-11-22 NOTE — Progress Notes (Signed)
   11/22/15 0619  Vitals  Temp 98.7 F (37.1 C)  Temp Source Oral  BP (!) 177/102  BP Location Left Arm  BP Method Automatic  Patient Position (if appropriate) Lying  Pulse Rate (!) 54  Pulse Rate Source Dinamap  Resp 18  Oxygen Therapy  SpO2 97 %  O2 Device Room Air  Height and Weight  Weight 84 kg (185 lb 3.2 oz) (scale c)  Type of Scale Used Standing   Molt, on call provider, was notified of bp. No new orders obtained at this time.

## 2015-11-23 LAB — BASIC METABOLIC PANEL
Anion gap: 12 (ref 5–15)
BUN: 17 mg/dL (ref 6–20)
CO2: 27 mmol/L (ref 22–32)
CREATININE: 1.2 mg/dL (ref 0.61–1.24)
Calcium: 9.5 mg/dL (ref 8.9–10.3)
Chloride: 102 mmol/L (ref 101–111)
GFR calc Af Amer: >60 mL/min (ref >60)
GLUCOSE: 100 mg/dL — AB (ref 65–99)
Potassium: 3.7 mmol/L (ref 3.5–5.1)
SODIUM: 141 mmol/L (ref 135–145)

## 2015-11-23 MED ORDER — PRAVASTATIN SODIUM 40 MG PO TABS: 40.0000 mg | ORAL_TABLET | Freq: Every day | ORAL | 0 refills | Status: DC

## 2015-11-23 NOTE — Progress Notes (Signed)
Subjective:   Spent lot of time yesterday to explain to the patient that his CHF can be managed with medication. He expressed his feeling of depression and frustration around his medical condition. In the afternoon he pulled off his Telemetry and wanted to leave AMA, however, my colleague Dr. Posey Pronto went to talk to him and calmed him down. Vitals stable overnight.   He is calm today. Expresses understanding of his medical condition and need to take meds. States he will quit smoking and start exercising.   Objective:  Vital signs in last 24 hours: Vitals:   11/22/15 0619 11/22/15 1214 11/23/15 0118 11/23/15 0414  BP: (!) 177/102 (!) 159/91 (!) 170/100 (!) 158/81  Pulse: (!) 54 69 (!) 50 (!) 48  Resp: 18 20 18 18   Temp: 98.7 F (37.1 C) 98.6 F (37 C) 98.1 F (36.7 C) 97.9 F (36.6 C)  TempSrc: Oral Oral Oral Oral  SpO2: 97% 98% 97% 100%  Weight: 185 lb 3.2 oz (84 kg)   183 lb 12.8 oz (83.4 kg)  Height:       Constitutional: NAD, sitting comfortably.  CV: RRR, distant heart sounds, no murmurs, rubs or gallops Resp: CTAB, no increased work of breathing, no wheezing or crackles Abd: soft, +BS, nondistended, nontender Ext: warm, nonedematous, 2+pulses throughout Skin: warm, dry, sternotomy scar  Assessment/Plan:  Active Problems:   H/O medication noncompliance   CAD, multiple vessel s/p CABG 2011    Tobacco abuse disorder   Hypertensive emergency   HLD (hyperlipidemia)   Acute CHF (congestive heart failure) (HCC)   Acute respiratory failure (HCC)   NSTEMI (non-ST elevated myocardial infarction) (HCC)   GERD (gastroesophageal reflux disease)   Acute kidney injury (HCC)  Acute CHF exacerbation-- 2/2 to medication and dietary noncompliance ECHO in 04/2015 w/ EF of 25-30% w/ grade 2 diastolic dysfunction. Has ICD placed.  On admission, patient presented w/ acute SOB that started DOA night, noted on CXR to have pulmonary edema, elevated BNP of 1919, and crackles on lung exam  noted by ED provider. His symptoms have all improved since admission. Currently denies any SOB. satting fine on room air. Weight down 5 lbs.  - initially got lasix 40mg  iv bid, now doing fine on Po lasix 40mg  daily - spent significant amount of time discussing the importance of taking his medications and also encouraged him to try his best to make the lifestyle changes along with meds. He understandably has lot of social stressors (financial, and also bad experience in the past with his mother dying from CHF).  -- continue home asa, coreg, restarted lisinopril 20mg ; d/c hydralazine to reduce pill burden. - he needs close f/up outpatient. His meds can be further adjusted on follow up. He stated he will call his outpatient clinic tomorrow and ask for 1 week hospital follow up appt.   HTN urgency-- 234/126 on presentation, was put on nitro gtt. This is likely 2/2 to medication noncomplaince.  Likely . Pt complained of headache on admission likely from nitro gtt. --BP is relatively well controlled 150/80's now. continue home coreg 25 bid; lisinopril 20mg  daily;  --ibuprofen 600mg  prn for HA. This may also contribute to HTN.   Hyperkalemia> Hypokalemia-- - replete prn.   AKI-- Creatinine 1.58, b/l around 1.0. Likely cardiorenal since it has improved with IV Lasix.  - improved close to b/l ~1.2 now.   HLD--LDL 118 in 2014. Per cards he was intolerant of simvastatin, at home on pravastatin 20mg . --Fasting lipids: LDL  110 --Increase to pravastatin 40mg , monitor for side-effects  GERD: Patient has a reported history of GERD on pantoprazole 40mg  daily, but he takes is as needed. States he still has breakthrough symptoms of reflux that he treats with nitroglycerin, but takes pantoprazole as needed for indigestion. --continue pantoprazole 40mg  Daily --provided education to patient about proper administration of PPI's   Dispo: Anticipated discharge in approximately 1 day.   Dellia Nims,  MD 11/23/2015, 8:07 AM Pager: [phone]

## 2015-11-23 NOTE — Discharge Summary (Signed)
Name: Roger Martinez MRN: [id number] DOB: 1966/02/10 50 y.o. PCP: No Pcp Per Patient  Date of Admission: 11/21/2015  4:28 AM Date of Discharge: 11/24/2015 Attending Physician: No att. providers found  Discharge Diagnosis: 1. Acute CHF exacerbation Active Problems:   H/O medication noncompliance   CAD, multiple vessel s/p CABG 2011    Tobacco abuse disorder   Hypertensive emergency   HLD (hyperlipidemia)   Acute CHF (congestive heart failure) (HCC)   Acute respiratory failure (HCC)   NSTEMI (non-ST elevated myocardial infarction) (HCC)   GERD (gastroesophageal reflux disease)   Acute kidney injury ([place])   Discharge Medications:   Medication List    STOP taking these medications   hydrALAZINE 50 MG tablet Commonly known as:  APRESOLINE   lovastatin 20 MG tablet Commonly known as:  MEVACOR Replaced by:  pravastatin 40 MG tablet   potassium chloride SA 20 MEQ tablet Commonly known as:  K-DUR,KLOR-CON     TAKE these medications   aspirin EC 81 MG tablet Take 81 mg by mouth daily.   carvedilol 25 MG tablet Commonly known as:  COREG Take 1 tablet (25 mg total) by mouth 2 (two) times daily with a meal.   furosemide 40 MG tablet Commonly known as:  LASIX Take 1 tablet (40 mg total) by mouth daily.   lisinopril 20 MG tablet Commonly known as:  PRINIVIL,ZESTRIL Take 1 tablet (20 mg total) by mouth daily.   pantoprazole 40 MG tablet Commonly known as:  PROTONIX Take 1 tablet (40 mg total) by mouth every 3 (three) days. What changed:  when to take this   pravastatin 40 MG tablet Commonly known as:  PRAVACHOL Take 1 tablet (40 mg total) by mouth daily at 6 PM. Replaces:  lovastatin 20 MG tablet       Disposition and follow-up:   Roger Martinez was discharged from [facility] in Stable condition.  At the hospital follow up visit please address:  1.   --f/u symptoms of shortness of breath --f/u side effects of inc pravastatin to 40m due to  mult co-morbidities and risk factors --f/u how and when patient is taking medications, should be as listed above --f/u blood pressure control  2.  Labs / imaging needed at time of follow-up: BMP, specifically K, Cr  3.  Pending labs/ test needing follow-up: None  Follow-up Appointments: Follow-up Information    [facility]. Go on 12/17/2015.   Why:  You have an appointment with DChana Bodeto establish as a new patient on Sept. 6th at 9:30am.  Contact information: [address] [phone]       ** Patient was also notified and agreed to call the above number to be seen in the next week for a hospital f/u appointment. [facility]  Hospital Course by problem list: Active Problems:   H/O medication noncompliance   CAD, multiple vessel s/p CABG 2011    Tobacco abuse disorder   Hypertensive emergency   HLD (hyperlipidemia)   Acute CHF (congestive heart failure) (HCC)   Acute respiratory failure (HCC)   NSTEMI (non-ST elevated myocardial infarction) (HCC)   GERD (gastroesophageal reflux disease)   Acute kidney injury ([place]   Acute CHF Exacerbation: On admission, patient presented w/ acute SOB that started DOA night, noted on CXR to have pulmonary edema, elevated BNP of 1919, and crackles on  lung exam noted by ED provider. He initially required BIPAP but has been weaned down; he is currently on RA. ECHO in 04/2015 w/ EF of 25-30% w/ grade 2 diastolic dysfunction. ECHO in 04/2015 w/ EF of 25-30% w/ grade 2 diastolic dysfunction. Likely trigger for CHF exacerbation is diet and medication non compliance. Could also be an arrhythmia as he has dilated ischemic cardiomyopathy. His troponin was 0.07 in the ED but likely in the setting of demand supply ischemia from CHF exacerbation, f/u troponins peaked at 0.21. EKG is negative for signs of ischemia. He was initially  treated with IV lasix, with good diuresis and a weight loss of 5lbs, then switched to PO lasix 44m daily. His medications were adjusted as listed to simplify administration and limit pill burden: d/c hydralazine; take asa 823m coreg 2513maily, lisinopril 62m71masix 40mg50m was also educated on trying to limit fluid intake to 1.5L daily, and salt intake to less than 1.5g daily.  HTN urgency: 234/126 on presentation, was put on nitro gtt in the ED. This is likely 2/2 to medication noncomplainc. Pt complained of headache on admission likely from nitro gtt. He was weaned off of nitro gtt and continued on his home dose of coreg 25mg 55m His lisinopril 62mg w21meld initially due to mild AKI, but restarted for better BP control once Cr normalized.  Electrolyte Imbalance: Patient presented with K 5.9; he was on KDur 62mEq q37ms at home. There were no T wave changes on EKG. He was given IV lasix and consequently his K fell to 3.0. He was repleated with PO and IV potassium. On d/c, his potassium was 3.7 and Mg was 2.0. He was discharged on his home dose of 62mEq KD38m3days.  AKI: Creatinine 1.58 on admission with baseline around 1. Likely cardiorenal since it has improved with IV Lasix. His Lisinopril 62mg was 41mially held, and when the Cr normalized, he was restarted on it for better BP control. At d/c his Cr was 1.2.  HLD: Patient has a history of hyperlipidemia with LDL 118 in 2014. Per cards he was intolerant of simvastatin, at home on pravastatin 62mg. His 81ming lipids were repeated while inpatient and his LDL was 110. We increased his pravastatin to 40mg daily 32mto his multiple risk factors. We contemplated starting atorvastatin, but due to patient's financial situation currently, it may not be feasible. At discharge he was tolerating the inc in dose.  GERD: Patient has a reported history of GERD on pantoprazole 40mg daily, 49mhe takes is as needed. States he still has breakthrough symptoms of  reflux that he treats with nitroglycerin, but takes pantoprazole as needed for indigestion. Patient remained asymptomatic throughout admission. We provided education to patient about proper administration of PPI's and need for day-to-day use. We continued his pantoprazole 40mg daily.  3mcharge Vitals:   BP (!) 158/81 (BP Location: Right Arm)   Pulse (!) 48   Temp 97.9 F (36.6 C) (Oral)   Resp 18   Ht '5\' 11"'  (1.803 m)   Wt 83.4 kg (183 lb 12.8 oz)   SpO2 100%   BMI 25.63 kg/m   Pertinent Labs, Studies, and Procedures:  Results for Kauth, CALVINTYKEL, BADIE4) as [id number] 14:18  Ref. Range 11/23/2015 03:56  Sodium Latest Ref Range: 135 - 145 mmol/L 141  Potassium Latest Ref Range: 3.5 - 5.1 mmol/L 3.7  Chloride Latest Ref Range: 101 - 111 mmol/L 102  CO2 Latest Ref Range: 22 -  32 mmol/L 27  BUN Latest Ref Range: 6 - 20 mg/dL 17  Creatinine Latest Ref Range: 0.61 - 1.24 mg/dL 1.20  Calcium Latest Ref Range: 8.9 - 10.3 mg/dL 9.5  EGFR (Non-African Amer.) Latest Ref Range: >60 mL/min >60  EGFR (African American) Latest Ref Range: >60 mL/min >60  Glucose Latest Ref Range: 65 - 99 mg/dL 100 (H)  Anion gap Latest Ref Range: 5 - 15  12     Ref. Range 11/21/2015 04:49 11/21/2015 10:36 11/21/2015 16:02 11/21/2015 22:20 11/22/2015 04:47  B Natriuretic Peptide Latest Ref Range: 0.0 - 100.0 pg/mL 1,919.9 (H)      Troponin I Latest Ref Range: <0.03 ng/mL  0.11 (HH) 0.17 (HH) 0.21 (HH) 0.21 (HH)    Ref. Range 11/22/2015 04:48  Cholesterol Latest Ref Range: 0 - 200 mg/dL 173  Triglycerides Latest Ref Range: <150 mg/dL 67  HDL Cholesterol Latest Ref Range: >40 mg/dL 50  LDL (calc) Latest Ref Range: 0 - 99 mg/dL 110 (H)  VLDL Latest Ref Range: 0 - 40 mg/dL 13  Total CHOL/HDL Ratio Latest Units: RATIO 3.5    Ref. Range 11/22/2015 04:47  HIV Latest Ref Range: Non Reactive  Non Reactive   CXR 11/21/2015: Vascular congestion and mild cardiomegaly. Increased interstitial markings raise concern  for pulmonary edema. Small left pleural effusion noted.    Discharge Instructions: Discharge Instructions    Diet - low sodium heart healthy    Complete by:  As directed   Discharge instructions    Complete by:  As directed   Please weight yourself daily and call your doctor if your weight goes up more than 2 pounds from your normal weight.  Take lasix and other medications ever day. Don't miss any dose.  Keep your total fluid intake less than 1500 mL daily. Avoid salt. Exercise as much as tolerated, take it slow.   Increase activity slowly    Complete by:  As directed      Signed: Alphonzo Grieve, MD 11/24/2015, 11:32 AM   Pager: [phone]

## 2015-11-28 ENCOUNTER — Telehealth: Payer: Self-pay | Admitting: *Deleted

## 2015-11-28 ENCOUNTER — Other Ambulatory Visit: Payer: Self-pay | Admitting: *Deleted

## 2015-11-28 MED ORDER — LISINOPRIL 20 MG PO TABS: 20.0000 mg | ORAL_TABLET | Freq: Every day | ORAL | 3 refills | Status: DC

## 2015-11-28 NOTE — Telephone Encounter (Signed)
Refills given   And  F/u appt made ./cy

## 2015-11-28 NOTE — Telephone Encounter (Signed)
Patient called and requested a refill on lisinopril 10 mg. He verified that this is his current dose and he takes one daily. He stated that he was started on this medication in the emergency dept. He states that he has a new patient appointment to establish with a pcp and would like to see if Dr [name] will refill it until then. He can be reached at [phone]. Thanks, MI

## 2015-11-28 NOTE — Progress Notes (Signed)
Pt aware refill done  And   F/u appt  Made .Adonis Housekeeper

## 2015-11-29 ENCOUNTER — Other Ambulatory Visit: Payer: Self-pay | Admitting: Cardiology

## 2015-12-01 ENCOUNTER — Telehealth: Payer: Self-pay | Admitting: Cardiology

## 2015-12-01 MED ORDER — POTASSIUM CHLORIDE CRYS ER 20 MEQ PO TBCR: 20.0000 meq | EXTENDED_RELEASE_TABLET | Freq: Every day | ORAL | 1 refills | Status: DC

## 2015-12-01 NOTE — Telephone Encounter (Signed)
New message       *STAT* If patient is at the pharmacy, call can be transferred to refill team.   1. Which medications need to be refilled? (please list name of each medication and dose if known) klorcon pt not sure of the mg  2. Which pharmacy/location (including street and city if local pharmacy) is medication to be sent to? [place] [facility]   3. Do they need a 30 day or 90 day supply? Pt not sure

## 2015-12-03 ENCOUNTER — Encounter: Payer: Self-pay | Admitting: Cardiology

## 2015-12-12 ENCOUNTER — Encounter (HOSPITAL_COMMUNITY): Payer: Self-pay

## 2015-12-12 ENCOUNTER — Emergency Department (HOSPITAL_COMMUNITY): Payer: PRIVATE HEALTH INSURANCE

## 2015-12-12 ENCOUNTER — Emergency Department (HOSPITAL_COMMUNITY)
Admission: EM | Admit: 2015-12-12 | Discharge: 2015-12-12 | Disposition: A | Discharge status: Home / Self Care | Payer: PRIVATE HEALTH INSURANCE | Attending: Emergency Medicine | Admitting: Emergency Medicine

## 2015-12-12 DIAGNOSIS — Z9114 Patient's other noncompliance with medication regimen: Secondary | ICD-10-CM | POA: Diagnosis not present

## 2015-12-12 DIAGNOSIS — I10 Essential (primary) hypertension: Secondary | ICD-10-CM

## 2015-12-12 DIAGNOSIS — I110 Hypertensive heart disease with heart failure: Secondary | ICD-10-CM | POA: Insufficient documentation

## 2015-12-12 DIAGNOSIS — I5042 Chronic combined systolic (congestive) and diastolic (congestive) heart failure: Secondary | ICD-10-CM | POA: Insufficient documentation

## 2015-12-12 DIAGNOSIS — Z87891 Personal history of nicotine dependence: Secondary | ICD-10-CM | POA: Diagnosis not present

## 2015-12-12 DIAGNOSIS — R1030 Lower abdominal pain, unspecified: Secondary | ICD-10-CM

## 2015-12-12 DIAGNOSIS — K5900 Constipation, unspecified: Secondary | ICD-10-CM | POA: Insufficient documentation

## 2015-12-12 DIAGNOSIS — I2510 Atherosclerotic heart disease of native coronary artery without angina pectoris: Secondary | ICD-10-CM | POA: Diagnosis not present

## 2015-12-12 DIAGNOSIS — Z982 Presence of cerebrospinal fluid drainage device: Secondary | ICD-10-CM | POA: Diagnosis not present

## 2015-12-12 DIAGNOSIS — Z79899 Other long term (current) drug therapy: Secondary | ICD-10-CM | POA: Insufficient documentation

## 2015-12-12 DIAGNOSIS — I252 Old myocardial infarction: Secondary | ICD-10-CM | POA: Diagnosis not present

## 2015-12-12 LAB — CBC WITH DIFFERENTIAL/PLATELET
BASOS PCT: 0 %
Basophils Absolute: 0 10*3/uL (ref 0.0–0.1)
EOS ABS: 0.2 10*3/uL (ref 0.0–0.7)
Eosinophils Relative: 2 %
HEMATOCRIT: 44.9 % (ref 39.0–52.0)
Hemoglobin: 14.9 g/dL (ref 13.0–17.0)
Lymphocytes Relative: 17 %
Lymphs Abs: 1.6 10*3/uL (ref 0.7–4.0)
MCH: 30.7 pg (ref 26.0–34.0)
MCHC: 33.2 g/dL (ref 30.0–36.0)
MCV: 92.4 fL (ref 78.0–100.0)
MONO ABS: 0.8 10*3/uL (ref 0.1–1.0)
MONOS PCT: 9 %
Neutro Abs: 6.6 10*3/uL (ref 1.7–7.7)
Neutrophils Relative %: 72 %
PLATELETS: 238 10*3/uL (ref 150–400)
RBC: 4.86 MIL/uL (ref 4.22–5.81)
RDW: 13.9 % (ref 11.5–15.5)
WBC: 9.1 10*3/uL (ref 4.0–10.5)

## 2015-12-12 LAB — COMPREHENSIVE METABOLIC PANEL
ALBUMIN: 3.7 g/dL (ref 3.5–5.0)
ALK PHOS: 87 U/L (ref 38–126)
ALT: 10 U/L — ABNORMAL LOW (ref 17–63)
AST: 22 U/L (ref 15–41)
Anion gap: 10 (ref 5–15)
BILIRUBIN TOTAL: 1 mg/dL (ref 0.3–1.2)
BUN: 10 mg/dL (ref 6–20)
CALCIUM: 9.5 mg/dL (ref 8.9–10.3)
CO2: 23 mmol/L (ref 22–32)
Chloride: 109 mmol/L (ref 101–111)
Creatinine, Ser: 1.19 mg/dL (ref 0.61–1.24)
GFR calc Af Amer: >60 mL/min (ref >60)
GFR calc non Af Amer: >60 mL/min (ref >60)
GLUCOSE: 98 mg/dL (ref 65–99)
Potassium: 3.5 mmol/L (ref 3.5–5.1)
Sodium: 142 mmol/L (ref 135–145)
TOTAL PROTEIN: 7.2 g/dL (ref 6.5–8.1)

## 2015-12-12 LAB — URINALYSIS, ROUTINE W REFLEX MICROSCOPIC
Bilirubin Urine: NEGATIVE
GLUCOSE, UA: NEGATIVE mg/dL
HGB URINE DIPSTICK: NEGATIVE
Ketones, ur: 15 mg/dL — AB
LEUKOCYTES UA: NEGATIVE
Nitrite: NEGATIVE
PROTEIN: 30 mg/dL — AB
SPECIFIC GRAVITY, URINE: 1.022 (ref 1.005–1.030)
pH: 7 (ref 5.0–8.0)

## 2015-12-12 LAB — RAPID URINE DRUG SCREEN, HOSP PERFORMED
AMPHETAMINES: NOT DETECTED
BENZODIAZEPINES: NOT DETECTED
Barbiturates: NOT DETECTED
Cocaine: NOT DETECTED
OPIATES: NOT DETECTED
TETRAHYDROCANNABINOL: POSITIVE — AB

## 2015-12-12 LAB — URINE MICROSCOPIC-ADD ON: RBC / HPF: NONE SEEN RBC/hpf (ref 0–5)

## 2015-12-12 LAB — POC OCCULT BLOOD, ED: FECAL OCCULT BLD: NEGATIVE

## 2015-12-12 LAB — LIPASE, BLOOD: Lipase: 16 U/L (ref 11–51)

## 2015-12-12 MED ORDER — POLYETHYLENE GLYCOL 3350 17 G PO PACK: 17.0000 g | PACK | Freq: Every day | ORAL | 0 refills | Status: DC | PRN

## 2015-12-12 MED ORDER — LISINOPRIL 20 MG PO TABS
20.0000 mg | ORAL_TABLET | Freq: Once | ORAL | Status: AC
  Administered 2015-12-12: 20 mg via ORAL
  Filled 2015-12-12: qty 1

## 2015-12-12 MED ORDER — DICYCLOMINE HCL 10 MG PO CAPS
10.0000 mg | ORAL_CAPSULE | Freq: Once | ORAL | Status: AC
  Administered 2015-12-12: 10 mg via ORAL
  Filled 2015-12-12: qty 1

## 2015-12-12 MED ORDER — DICYCLOMINE HCL 20 MG PO TABS: 20.0000 mg | ORAL_TABLET | Freq: Two times a day (BID) | ORAL | 0 refills | Status: DC

## 2015-12-12 MED ORDER — CARVEDILOL 12.5 MG PO TABS
25.0000 mg | ORAL_TABLET | Freq: Once | ORAL | Status: AC
Start: 2015-12-12 — End: 2015-12-12
  Administered 2015-12-12: 25 mg via ORAL
  Filled 2015-12-12: qty 2

## 2015-12-12 MED ORDER — FUROSEMIDE 20 MG PO TABS
40.0000 mg | ORAL_TABLET | Freq: Once | ORAL | Status: AC
  Administered 2015-12-12: 40 mg via ORAL
  Filled 2015-12-12: qty 2

## 2015-12-12 MED ORDER — FLEET ENEMA 7-19 GM/118ML RE ENEM
1.0000 | ENEMA | Freq: Once | RECTAL | Status: AC
  Administered 2015-12-12: 1 via RECTAL
  Filled 2015-12-12: qty 1

## 2015-12-12 NOTE — ED Triage Notes (Signed)
Pt states that he has been constipated for two days and having lower abd pain. Pt states that he has not taken anything at home, and would like something to assist in using the bathroom, denies n/v/d.

## 2015-12-12 NOTE — ED Notes (Signed)
Pt ambulatory at discharge, politely refused wheelchair. Verbalizes understanding of discharge instructions. NAD.

## 2015-12-12 NOTE — Discharge Instructions (Signed)
Your labs today were unrevealing. Your x-ray did show quite a bit of stool in your colon. Take miralax as prescribed as needed for constipation. Take bentyl as needed for abdominal pain. As we discussed, your blood pressure remained high in the emergency room today despite giving your home medications. You did not want to stay for further treatment. Please keep your appointment with cardiology next week and make sure to take all of your home blood pressure medications as directed. Return to the ER for new or worsening symptoms such as chest pain, shortness of breath, vomiting, high fever, etc.

## 2015-12-12 NOTE — ED Notes (Signed)
Pt reports abdominal pain is much better after bentyl. Pt encouraged to complete enema as PA reports lots of stool present in colon. Pt requesting to complete enema independently.

## 2015-12-12 NOTE — ED Notes (Signed)
Pt offered sprite for PO challenge. Pt still has not had bowel movement regardless of enema, however patient reports relief of pain and discomfort. PA aware.

## 2015-12-12 NOTE — ED Notes (Addendum)
Pt in the middle of being triaged, when being asked the questionnaire pt became very angry and ripped off his BP cuff and pulse ox and states "I don't need a counseling session" Pt stormed out of the room after being encouraged to stay. Pt then was in the hallway and asked again to return to his room. Pt cursing at staff stating  "Your to fu**ing slow im leaving"

## 2015-12-12 NOTE — ED Provider Notes (Signed)
[facility] DEPT Provider Note   CSN: [id number] Arrival date & time: 12/12/15  [id number]  History   Chief Complaint Chief Complaint  Patient presents with  . Constipation  . Abdominal Pain   HPI   Roger Martinez is an 50 y.o. male with medical history significant for CHF (EF 20-25% 2015), CAD s/p CABG x3, HTN, HLD, substance abuse, and medication noncompliance who presents to the ED for evaluation of abdominal pain and constipation for the past two days. He states on 8/30 he ate chicken wings with hot sauce and does not typically eat hot sauce. States that evening started feeling lower abdominal cramping but still had small pebble-like BM. States that since then he has not had another BM. States like he needs to have a BM but cannot. He states he did have a little bit of watery diarrhea yesterday. Reports associated diffuse lower abdominal pain, worse on the left side, that is constant but will periodically increase in severity. The waves of pain last ~30 seconds. He states he has had mild intermittent nausea but no emesis. States he has poor appetite. Denies history of abdominal surgeries or bowel obstructions. He has not tried anything for his constipation. Denies new meds or opioid use. Denies fever, chills, urinary symptoms, chest pain, or SOB.  Notably pt is hypertensive in the ED to >200/120. He admits he has not taken any of his home meds over the past two days because he "did not have it in me" due to poor appetite.   Past Medical History:  Diagnosis Date  . Anxiety state, unspecified   . Chronic combined systolic (congestive) and diastolic (congestive) heart failure ([place])    a. 03/2014 Echo: EF 20-25%, diff HK, Gr 2 DD, mild AI, sev dil LA, mod reduced RV fxn, PASP 39mmHg.  Marland Kitchen Coronary artery disease    a. 2011 s/p CABG x 3.  . Hyperlipidemia   . Hypertensive heart disease with heart failure ([place])   . Ischemic cardiomyopathy    a. 03/2014 Echo: EF 20-25%;  b. 06/2014 s/p BSX single  lead AICD, ser # [id number].  . Marijuana abuse   . MI (myocardial infarction) ([place]) 03/2011  . Noncompliance   . STD (sexually transmitted disease)   . Tobacco abuse     Patient Active Problem List   Diagnosis Date Noted  . NSTEMI (non-ST elevated myocardial infarction) ([place]) 11/22/2015  . GERD (gastroesophageal reflux disease) 11/22/2015  . Acute kidney injury ([place]) 11/22/2015  . Acute respiratory failure ([place])   . Acute CHF (congestive heart failure) ([place]) 09/07/2015  . Perianal abscess s/p I&D 06/13/2015 06/13/2015  . Perirectal abscess 06/13/2015  . Hypertensive urgency 05/22/2015  . Acute congestive heart failure ([place])   . Acute on chronic combined systolic and diastolic congestive heart failure, NYHA class 3 ([place]) 05/21/2015  . Tobacco abuse disorder 05/07/2015  . Chronic combined systolic (congestive) and diastolic (congestive) heart failure ([place])   . Hypertensive heart disease with heart failure ([place])   . Ischemic cardiomyopathy 06/17/2014  . H/O medication noncompliance 04/05/2013  . CAD, multiple vessel s/p CABG 2011  04/05/2013  . Arteriosclerosis of coronary artery 03/20/2010  . Cannot sleep 03/18/2010  . Hypertensive emergency 03/16/2010  . HLD (hyperlipidemia) 03/13/2010    Past Surgical History:  Procedure Laterality Date  . BYPASS GRAFT  2011   3 vessel  . EP IMPLANTABLE DEVICE  06/17/2014   DR [name]  . IMPLANTABLE CARDIOVERTER DEFIBRILLATOR IMPLANT N/A 06/17/2014   Procedure: IMPLANTABLE CARDIOVERTER DEFIBRILLATOR  IMPLANT;  Surgeon: Deboraha Sprang, MD;  Location: [facility] CATH LAB;  Service: Cardiovascular;  Laterality: N/A;  . INCISION AND DRAINAGE PERIRECTAL ABSCESS N/A 06/13/2015   Procedure: IRRIGATION AND DEBRIDEMENT PERIRECTAL ABSCESS;  Surgeon: Michael Boston, MD;  Location: [facility] ORS;  Service: General;  Laterality: N/A;       Home Medications    Prior to Admission medications   Medication Sig Start Date End Date Taking? Authorizing Provider  aspirin EC 81 MG  tablet Take 81 mg by mouth daily.     Historical Provider, MD  carvedilol (COREG) 25 MG tablet Take 1 tablet (25 mg total) by mouth 2 (two) times daily with a meal. 06/23/15   Burtis Junes, NP  furosemide (LASIX) 40 MG tablet Take 1 tablet (40 mg total) by mouth daily. 06/23/15   Burtis Junes, NP  lisinopril (PRINIVIL,ZESTRIL) 20 MG tablet Take 1 tablet (20 mg total) by mouth daily. 11/28/15   Jerline Pain, MD  pantoprazole (PROTONIX) 40 MG tablet Take 1 tablet (40 mg total) by mouth every 3 (three) days. Patient taking differently: Take 40 mg by mouth daily.  08/07/15   Liliane Shi, PA-C  potassium chloride SA (K-DUR,KLOR-CON) 20 MEQ tablet Take 1 tablet (20 mEq total) by mouth daily. 12/01/15   Jerline Pain, MD  pravastatin (PRAVACHOL) 40 MG tablet Take 1 tablet (40 mg total) by mouth daily at 6 PM. 11/23/15   Dellia Nims, MD    Family History Family History  Problem Relation Age of Onset  . Hypertension Mother     deceased.  . Diabetes Mother   . Other Father     alive - pt unaware of medical hx.    Social History Social History  Substance Use Topics  . Smoking status: Former Smoker    Packs/day: 0.00    Years: 20.00  . Smokeless tobacco: Never Used  . Alcohol use No     Allergies   Review of patient's allergies indicates no known allergies.   Review of Systems Review of Systems  All other systems reviewed and are negative.    Physical Exam Updated Vital Signs BP (S) (!) 204/126 Comment: pt states that he has not taken his BP medications in two days   Pulse 84   Temp 98 F (36.7 C)   Resp 18   Ht 5\' 11"  (1.803 m)   Wt 88 kg   SpO2 99%   BMI 27.06 kg/m   Physical Exam  Constitutional: He is oriented to person, place, and time.  Appears uncomfortable agitated  HENT:  Right Ear: External ear normal.  Left Ear: External ear normal.  Nose: Nose normal.  Mouth/Throat: Oropharynx is clear and moist. No oropharyngeal exudate.  Eyes: Conjunctivae are  normal.  Neck: Neck supple.  Cardiovascular: Normal rate, regular rhythm, normal heart sounds and intact distal pulses.   Pulmonary/Chest: Effort normal and breath sounds normal. No respiratory distress. He has no wheezes.  Abdominal: Soft. Bowel sounds are normal. He exhibits no distension. There is tenderness. There is no rebound and no guarding.  Bilateral lower abdominal tenderness without rebound or guarding Abdomen soft No CVA tenderness  Genitourinary: Rectum normal. Rectal exam shows guaiac negative stool.  Genitourinary Comments: Soft brown stool on glove on DRE. No stool ball palpable in rectum  Musculoskeletal: He exhibits no edema.  Lymphadenopathy:    He has no cervical adenopathy.  Neurological: He is alert and oriented to person, place, and time. No cranial  nerve deficit.  Skin: Skin is warm and dry.  Psychiatric: He has a normal mood and affect.  Nursing note and vitals reviewed.    ED Treatments / Results  Labs (all labs ordered are listed, but only abnormal results are displayed) Labs Reviewed  COMPREHENSIVE METABOLIC PANEL - Abnormal; Notable for the following:       Result Value   ALT 10 (*)    All other components within normal limits  URINALYSIS, ROUTINE W REFLEX MICROSCOPIC (NOT AT [facility]) - Abnormal; Notable for the following:    Ketones, ur 15 (*)    Protein, ur 30 (*)    All other components within normal limits  URINE RAPID DRUG SCREEN, HOSP PERFORMED - Abnormal; Notable for the following:    Tetrahydrocannabinol POSITIVE (*)    All other components within normal limits  URINE MICROSCOPIC-ADD ON - Abnormal; Notable for the following:    Squamous Epithelial / LPF 0-5 (*)    Bacteria, UA FEW (*)    Casts HYALINE CASTS (*)    All other components within normal limits  LIPASE, BLOOD  CBC WITH DIFFERENTIAL/PLATELET  POC OCCULT BLOOD, ED    EKG  EKG Interpretation None       Radiology Dg Abdomen Acute W/chest  Result Date: 12/12/2015 CLINICAL  DATA:  Two day history of lower abdominal pain and constipation EXAM: DG ABDOMEN ACUTE W/ 1V CHEST COMPARISON:  Chest radiograph November 21, 2015 and CT abdomen and pelvis June 13, 2015 FINDINGS: PA chest: There are scattered small calcified granulomas bilaterally. There is no edema or consolidation. There is stable cardiomegaly with mild pulmonary venous hypertension. Pacemaker lead is attached to the right ventricle. Patient is status post coronary artery bypass grafting. No evidence of adenopathy. No bone lesions evident. Supine and upright abdomen: There is moderate stool throughout the colon. There is no bowel dilatation or air-fluid level to suggest obstruction. No free air. There are scattered foci of arterial vascular calcification. IMPRESSION: There is a degree of pulmonary vascular congestion without edema or consolidation. Stable cardiac silhouette. Pacemaker lead attached to right ventricle. Bowel gas pattern is unremarkable without bowel obstruction or free air. There are foci of vascular atherosclerosis. Electronically Signed   By: Lowella Grip III M.D.   On: 12/12/2015 07:06    Procedures Procedures (including critical care time)  Medications Ordered in ED Medications  sodium phosphate (FLEET) 7-19 GM/118ML enema 1 enema (not administered)  carvedilol (COREG) tablet 25 mg (not administered)  furosemide (LASIX) tablet 40 mg (not administered)  lisinopril (PRINIVIL,ZESTRIL) tablet 20 mg (not administered)  dicyclomine (BENTYL) capsule 10 mg (not administered)     Initial Impression / Assessment and Plan / ED Course  I have reviewed the triage vital signs and the nursing notes.  Pertinent labs & imaging results that were available during my care of the patient were reviewed by me and considered in my medical decision making (see chart for details).  Clinical Course   9:38 AM Pain completely resolved after bentyl. Pt subjectively feels better but has not had a BM in the ED  despite administration of fleet enema. He does have quite a bit of stool burden on exam, no palpable stool on DRE. No e/o obstruction. He is tolerating PO. Labs reveal a UDS positive for THC, some proteinuria and ketonuria. CBC and CMP otherwise unremarkble. Repeat abdominal exam benign. Pt would like to go home. Does not want to wait for BM in the ED. Will give rx for miralax.  His BP also remains elevated despite administration of home BP meds, though improved. He states he has not taken his home meds in the past 3-4 days. He has no chest pain or shortness of breath. Kidney function normal. He has cardiology appointment on 12/17/15. He does not want to stay for further BP treatment in the ED. He understands the risks of continued medication noncompliance at home and risks of uncontrolled hypertension. Will d/c home with rx for bentyl and miralax. Strict ER return precautions given. Instructed close PCP and cardiology follow up.  Vitals:   12/12/15 0621 12/12/15 0622 12/12/15 0857 12/12/15 0954  BP:  (S) (!) 204/126 (!) 208/122 (!) 185/108  Pulse:  84 72 61  Resp:  18 16 16   Temp:  98 F (36.7 C)  99.2 F (37.3 C)  TempSrc:    Oral  SpO2:  99% 95% 95%  Weight: 88 kg     Height: 5\' 11"  (1.803 m)        Final Clinical Impressions(s) / ED Diagnoses   Final diagnoses:  Constipation, unspecified constipation type  Lower abdominal pain  Noncompliance with medication regimen  Essential hypertension    New Prescriptions Discharge Medication List as of 12/12/2015  9:45 AM    START taking these medications   Details  dicyclomine (BENTYL) 20 MG tablet Take 1 tablet (20 mg total) by mouth 2 (two) times daily., Starting Fri 12/12/2015, Print    polyethylene glycol [facility]) packet Take 17 g by mouth daily as needed., Starting Fri 12/12/2015, Print         Anne Ng, PA-C 12/12/15 1148    April Palumbo, MD 12/13/15 2302

## 2015-12-17 ENCOUNTER — Encounter: Payer: Self-pay | Admitting: Medical

## 2015-12-17 ENCOUNTER — Telehealth: Payer: Self-pay | Admitting: Medical

## 2015-12-17 ENCOUNTER — Ambulatory Visit (INDEPENDENT_AMBULATORY_CARE_PROVIDER_SITE_OTHER): Payer: PRIVATE HEALTH INSURANCE | Admitting: Medical

## 2015-12-17 ENCOUNTER — Encounter: Payer: Self-pay | Admitting: Cardiology

## 2015-12-17 ENCOUNTER — Encounter: Payer: Self-pay | Admitting: Gastroenterology

## 2015-12-17 VITALS — BP 170/110 | HR 60 | Resp 16 | Ht 69.5 in | Wt 196.2 lb

## 2015-12-17 DIAGNOSIS — I110 Hypertensive heart disease with heart failure: Secondary | ICD-10-CM

## 2015-12-17 DIAGNOSIS — Z1211 Encounter for screening for malignant neoplasm of colon: Secondary | ICD-10-CM | POA: Insufficient documentation

## 2015-12-17 DIAGNOSIS — R198 Other specified symptoms and signs involving the digestive system and abdomen: Secondary | ICD-10-CM | POA: Insufficient documentation

## 2015-12-17 DIAGNOSIS — K921 Melena: Secondary | ICD-10-CM | POA: Insufficient documentation

## 2015-12-17 DIAGNOSIS — Z125 Encounter for screening for malignant neoplasm of prostate: Secondary | ICD-10-CM

## 2015-12-17 DIAGNOSIS — M79661 Pain in right lower leg: Secondary | ICD-10-CM

## 2015-12-17 DIAGNOSIS — Z0000 Encounter for general adult medical examination without abnormal findings: Secondary | ICD-10-CM

## 2015-12-17 DIAGNOSIS — I5043 Acute on chronic combined systolic (congestive) and diastolic (congestive) heart failure: Secondary | ICD-10-CM

## 2015-12-17 DIAGNOSIS — Z720 Tobacco use: Secondary | ICD-10-CM | POA: Diagnosis not present

## 2015-12-17 DIAGNOSIS — E785 Hyperlipidemia, unspecified: Secondary | ICD-10-CM

## 2015-12-17 DIAGNOSIS — I2510 Atherosclerotic heart disease of native coronary artery without angina pectoris: Secondary | ICD-10-CM

## 2015-12-17 DIAGNOSIS — I739 Peripheral vascular disease, unspecified: Secondary | ICD-10-CM | POA: Insufficient documentation

## 2015-12-17 DIAGNOSIS — I255 Ischemic cardiomyopathy: Secondary | ICD-10-CM

## 2015-12-17 DIAGNOSIS — N400 Enlarged prostate without lower urinary tract symptoms: Secondary | ICD-10-CM

## 2015-12-17 DIAGNOSIS — K219 Gastro-esophageal reflux disease without esophagitis: Secondary | ICD-10-CM | POA: Diagnosis not present

## 2015-12-17 DIAGNOSIS — Z23 Encounter for immunization: Secondary | ICD-10-CM

## 2015-12-17 DIAGNOSIS — Z9114 Patient's other noncompliance with medication regimen: Secondary | ICD-10-CM

## 2015-12-17 DIAGNOSIS — Z9119 Patient's noncompliance with other medical treatment and regimen: Secondary | ICD-10-CM

## 2015-12-17 DIAGNOSIS — R194 Change in bowel habit: Secondary | ICD-10-CM

## 2015-12-17 DIAGNOSIS — K610 Anal abscess: Secondary | ICD-10-CM

## 2015-12-17 DIAGNOSIS — M79662 Pain in left lower leg: Principal | ICD-10-CM

## 2015-12-17 DIAGNOSIS — I5042 Chronic combined systolic (congestive) and diastolic (congestive) heart failure: Secondary | ICD-10-CM | POA: Diagnosis not present

## 2015-12-17 DIAGNOSIS — R0989 Other specified symptoms and signs involving the circulatory and respiratory systems: Secondary | ICD-10-CM

## 2015-12-17 LAB — LIPID PANEL
CHOL/HDL RATIO: 3 ratio (ref <=5.0)
CHOLESTEROL: 175 mg/dL (ref 125–200)
HDL: 59 mg/dL (ref >=40)
LDL Cholesterol: 100 mg/dL (ref <130)
TRIGLYCERIDES: 80 mg/dL (ref <150)
VLDL: 16 mg/dL (ref <30)

## 2015-12-17 LAB — COMPREHENSIVE METABOLIC PANEL
ALBUMIN: 4.2 g/dL (ref 3.6–5.1)
ALK PHOS: 90 U/L (ref 40–115)
ALT: 9 U/L (ref 9–46)
AST: 29 U/L (ref 10–40)
BILIRUBIN TOTAL: 0.5 mg/dL (ref 0.2–1.2)
BUN: 10 mg/dL (ref 7–25)
CALCIUM: 9.5 mg/dL (ref 8.6–10.3)
CO2: 20 mmol/L (ref 20–31)
Chloride: 105 mmol/L (ref 98–110)
Creat: 1.06 mg/dL (ref 0.60–1.35)
GLUCOSE: 87 mg/dL (ref 65–99)
POTASSIUM: 4.7 mmol/L (ref 3.5–5.3)
Sodium: 137 mmol/L (ref 135–146)
Total Protein: 7.6 g/dL (ref 6.1–8.1)

## 2015-12-17 LAB — POCT URINALYSIS DIPSTICK
Bilirubin, UA: NEGATIVE
Blood, UA: NEGATIVE
GLUCOSE UA: NEGATIVE
Ketones, UA: NEGATIVE
Leukocytes, UA: NEGATIVE
NITRITE UA: NEGATIVE
Spec Grav, UA: >=1.03
UROBILINOGEN UA: NEGATIVE
pH, UA: 6

## 2015-12-17 LAB — PSA: PSA: 0.5 ng/mL (ref <=4.0)

## 2015-12-17 NOTE — Telephone Encounter (Signed)
Pt notified. /RLB  

## 2015-12-17 NOTE — Progress Notes (Signed)
Subjective:   HPI  Roger Martinez is a 50 y.o. male who presents for a complete physical.  New patient.  Accompanied by wife today.  From [facility], he and mother both have hx/o heart disease.   Hasn't had primary care visit in 20 years, but has had ED visits, noncompliance on medication.  Recently saw cardiology in f/u.    Went to [facility] ED last week for stomach pain, diagnosed with constipation.    Medical care team includes:  cardiology   Concerns: Recently went to the ED for constipation, but miralax is helping.  In further discussion he endorses in recent months having bowel changes, sometimes constipation, sometimes diarrhea, sometimes blood in stool.  No prior GI consult.  Has hx/o anal fissure and peri-rectal abscess  He notes calve and lower leg pain with walking.  general when he reaches 1.5 miles gets pain relieved with rest.   Reviewed their medical, surgical, family, social, medication, and allergy history and updated chart as appropriate.  Past Medical History:  Diagnosis Date  . Anxiety state, unspecified   . Chronic combined systolic (congestive) and diastolic (congestive) heart failure ([place])    a. 03/2014 Echo: EF 20-25%, diff HK, Gr 2 DD, mild AI, sev dil LA, mod reduced RV fxn, PASP 62mmHg.  Marland Kitchen Coronary artery disease    a. 2011 s/p CABG x 3.  . GERD (gastroesophageal reflux disease)    takes protonix q3 days as of 12/2015  . Hyperlipidemia   . Hypertension   . Hypertensive heart disease with heart failure ([place])   . Ischemic cardiomyopathy    a. 03/2014 Echo: EF 20-25%;  b. 06/2014 s/p BSX single lead AICD, ser # [id number].  . Marijuana abuse   . MI (myocardial infarction) ([place]) 03/2011  . Noncompliance   . Perirectal abscess    once prior as of 12/2015  . Sciatica   . STD (sexually transmitted disease)   . Tobacco abuse   . Wears glasses     Past Surgical History:  Procedure Laterality Date  . BYPASS GRAFT  2011   3 vessel  . EP IMPLANTABLE DEVICE  06/17/2014    DR [name]  . FOOT SURGERY     trauma repair, 49yo, lawnmower incident  . IMPLANTABLE CARDIOVERTER DEFIBRILLATOR IMPLANT N/A 06/17/2014   Procedure: IMPLANTABLE CARDIOVERTER DEFIBRILLATOR IMPLANT;  Surgeon: Deboraha Sprang, MD;  Location: [facility] CATH LAB;  Service: Cardiovascular;  Laterality: N/A;  . INCISION AND DRAINAGE PERIRECTAL ABSCESS N/A 06/13/2015   Procedure: IRRIGATION AND DEBRIDEMENT PERIRECTAL ABSCESS;  Surgeon: Michael Boston, MD;  Location: [facility] ORS;  Service: General;  Laterality: N/A;    Social History   Social History  . Marital status: Married    Spouse name: N/A  . Number of children: N/A  . Years of education: N/A   Occupational History  . Not on file.   Social History Main Topics  . Smoking status: Former Smoker    Packs/day: 1.00    Years: 23.00  . Smokeless tobacco: Never Used  . Alcohol use No  . Drug use:     Types: Marijuana     Comment: none for 2 weeks  . Sexual activity: Not on file   Other Topics Concern  . Not on file   Social History Narrative   Lives in [place] with wife. Walking some for exercise.  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  Has 3 children in [place], [place].  Christian.  12/2015    Family History  Problem Relation Age of Onset  . Hypertension Mother     deceased.  . Diabetes Mother   . Heart disease Mother   . Alzheimer's disease Father   . Gallbladder disease Sister   . Other Brother     drug abuse, cocaine  . Cancer Neg Hx   . Stroke Neg Hx      Current Outpatient Prescriptions:  .  aspirin EC 81 MG tablet, Take 81 mg by mouth daily. , Disp: , Rfl:  .  carvedilol (COREG) 25 MG tablet, Take 1 tablet (25 mg total) by mouth 2 (two) times daily with a meal., Disp: 60 tablet, Rfl: 3 .  dicyclomine (BENTYL) 20 MG tablet, Take 1 tablet (20 mg total) by mouth 2 (two) times daily., Disp: 20 tablet, Rfl: 0 .  furosemide (LASIX) 40 MG tablet, Take 1 tablet (40 mg  total) by mouth daily., Disp: 30 tablet, Rfl: 3 .  lisinopril (PRINIVIL,ZESTRIL) 20 MG tablet, Take 1 tablet (20 mg total) by mouth daily., Disp: 30 tablet, Rfl: 3 .  nitroGLYCERIN (NITROSTAT) 0.4 MG SL tablet, Place 0.4 mg under the tongue every 5 (five) minutes as needed for chest pain., Disp: , Rfl:  .  pantoprazole (PROTONIX) 40 MG tablet, Take 1 tablet (40 mg total) by mouth every 3 (three) days. (Patient taking differently: Take 40 mg by mouth daily. ), Disp: 30 tablet, Rfl: 3 .  polyethylene glycol (MIRALAX) packet, Take 17 g by mouth daily as needed., Disp: 14 each, Rfl: 0 .  potassium chloride SA (K-DUR,KLOR-CON) 20 MEQ tablet, Take 1 tablet (20 mEq total) by mouth daily., Disp: 30 tablet, Rfl: 1 .  pravastatin (PRAVACHOL) 40 MG tablet, Take 1 tablet (40 mg total) by mouth daily at 6 PM., Disp: 30 tablet, Rfl: 0  No Known Allergies   Review of Systems Constitutional: -fever, -chills, -sweats, -unexpected weight change, -decreased appetite, -fatigue Allergy: -sneezing, -itching, -congestion Dermatology: -changing moles, --rash, -lumps ENT: -runny nose, -ear pain, -sore throat, -hoarseness, -sinus pain, -teeth pain, - ringing in ears, -hearing loss, -nosebleeds Cardiology: -chest pain, -palpitations, -swelling, -difficulty breathing when lying flat, -waking up short of breath Respiratory: -cough, -shortness of breath, -difficulty breathing with exercise or exertion, -wheezing, -coughing up blood Gastroenterology: +abdominal pain, -nausea, -vomiting, -diarrhea, +constipation, +blood in stool, +changes in bowel movement, -difficulty swallowing or eating Hematology: +bleeding, -bruising  Musculoskeletal: -joint aches, +muscle aches, -joint swelling, -back pain, -neck pain, +cramping, +changes in gait Ophthalmology: denies vision changes, eye redness, itching, discharge Urology: -burning with urination, -difficulty urinating, -blood in urine, -urinary frequency, -urgency,  -incontinence Neurology: -headache, -weakness, -tingling, -numbness, -memory loss, -falls, -dizziness Psychology: -depressed mood, -agitation, -sleep problems     Objective:   Physical Exam  BP (!) 170/110 (BP Location: Left Arm)   Pulse 60   Resp 16   Ht 5' 9.5" (1.765 m)   Wt 196 lb 3.2 oz (89 kg)   SpO2 99%   BMI 28.56 kg/m   General appearance: alert, no distress, WD/WN, AA male Skin: scattered macules, no worrisome lesions, HEENT: normocephalic, conjunctiva/corneas normal, sclerae anicteric, PERRLA, EOMi, nares patent, no discharge or erythema, pharynx normal Oral cavity: MMM, tongue normal, teeth with moderate plaque, missing some molars Neck: supple, no lymphadenopathy, no thyromegaly, no masses, normal ROM, no bruits Chest: vertical surgical CABG scar, non tender, normal shape and expansion Heart: ectopic beats from time to time, otherwise RRR, normal S1, S2, no murmurs Lungs: somewhat  decreased breath sounds in general, but no wheezes, rhonchi, or rales Abdomen: +bs, soft, mid upper abdomen with 3 linear port surgical scars, non tender, non distended, no masses, no hepatomegaly, no splenomegaly, no bruits Back: non tender, normal ROM, no scoliosis Musculoskeletal: linear surgical scar anterior left forearm from CABG surgery/bypass, right deltoid with small linear scar from prior trauma, upper extremities non tender, no obvious deformity, normal ROM throughout, lower extremities non tender, no obvious deformity, normal ROM throughout Extremities: no edema, no cyanosis, no clubbing Pulses: left radial pulse not appreciated, left ulnar pulse 1+.   Right arm 1+ pulses, pedal pulses faint bilat, cap refill seems normal throughout Neurological: alert, oriented x 3, CN2-12 intact, strength normal upper extremities and lower extremities, sensation normal throughout, DTRs 2+ throughout, no cerebellar signs, gait normal Psychiatric: normal affect, behavior normal, pleasant  GU: normal  male external genitalia, circumcised, nontender, no masses, no hernia, no lymphadenopathy Rectal: anus with tenderness posterior anus, thickened of tissue in same area, prostate seems moderately enlarged, no nodules, occult negative stool   Assessment and Plan :    Encounter Diagnoses  Name Primary?  . Annual physical exam   . CAD, multiple vessel s/p CABG 2011    . Ischemic cardiomyopathy   . Blood in stool   . Need for prophylactic vaccination and inoculation against influenza   . Chronic combined systolic (congestive) and diastolic (congestive) heart failure ([place])   . Hypertensive heart disease with heart failure ([place])   . Acute on chronic combined systolic and diastolic congestive heart failure, NYHA class 3 ([place])   . Arteriosclerosis of coronary artery   . Gastroesophageal reflux disease, esophagitis presence not specified   . Tobacco abuse disorder   . H/O medication noncompliance   . Perianal abscess s/p I&D 06/13/2015 Yes  . HLD (hyperlipidemia)   . Prostate enlargement   . Change in bowel function   . Screening for prostate cancer   . Claudication of both lower extremities ([place])   . Femoral bruit     Physical exam - discussed healthy lifestyle, diet, exercise, preventative care, vaccinations, and addressed their concerns.   Referral for angiogram of lower extremities given claudication symptoms Advised he needs to completely stop tobacco NOW! Reviewed recent cardiology notes.  Discussed importance of compliance with medications, diet, exercise, stopping tobacco, discussed appropriate f/u, discussed the significant/severe nature of his heart condition. He started back on statin 75mo ago.  Labs today counseled on the influenza virus vaccine.  Vaccine information sheet given.  Influenza vaccine given after consent obtained. Referral to GI for bowel changes, blood in stool See your dentist yearly for routine dental care including hygiene visits twice yearly. See your eye doctor  yearly for routine vision care. Follow-up pending labs, referral, call back     Eriksen was seen today for establish care.  Diagnoses and all orders for this visit:  Perianal abscess s/p I&D 06/13/2015 -     Ambulatory referral to Gastroenterology  Annual physical exam -     Urinalysis Dipstick -     Lipid panel -     Comprehensive metabolic panel -     PSA -     Hemoglobin A1c -     Microalbumin / creatinine urine ratio -     Ambulatory referral to Gastroenterology -     Flu Vaccine QUAD 36+ mos PF IM (Fluarix & Fluzone Quad PF)  CAD, multiple vessel s/p CABG 2011  -     Comprehensive metabolic  panel -     IR Angiogram Extremity Bilateral; Future  Ischemic cardiomyopathy -     IR Angiogram Extremity Bilateral; Future  Blood in stool -     Ambulatory referral to Gastroenterology  Need for prophylactic vaccination and inoculation against influenza  Chronic combined systolic (congestive) and diastolic (congestive) heart failure (H[facility])  Hypertensive heart disease with heart failure ([place])  Acute on chronic combined systolic and diastolic congestive heart failure, NYHA class 3 (H[facility])  Arteriosclerosis of coronary artery  Gastroesophageal reflux disease, esophagitis presence not specified -     Ambulatory referral to Gastroenterology  Tobacco abuse disorder  H/O medication noncompliance  HLD (hyperlipidemia)  Prostate enlargement  Change in bowel function -     Ambulatory referral to Gastroenterology  Screening for prostate cancer  Claudication of both lower extremities ([place]) -     IR Angiogram Extremity Bilateral; Future  Femoral bruit -     IR Angiogram Extremity Bilateral; Future

## 2015-12-17 NOTE — Telephone Encounter (Signed)
GI referral placed- they will call pt to schedule.   Pt is scheduled for Arterial Duplex (ordered with help of other clinical staff).   Called [facility] it is required for pt to have ABI with duplex. Orders entered. This is scheduled 12/26/2015. Victorino December

## 2015-12-17 NOTE — Telephone Encounter (Signed)
Since he had a lot of things going on today, I am sending this to make sure we get it all.   Refer to GI for blood in stool, GERD, changes in bowel habits/consistency  Set up bilateral lower extremity angiograms due to claudication, pain in legs with walking, bruit in left femoral region and known heart disease, smoker.  See Cheri about where to set this up

## 2015-12-18 ENCOUNTER — Other Ambulatory Visit: Payer: Self-pay | Admitting: Medical

## 2015-12-18 LAB — MICROALBUMIN / CREATININE URINE RATIO
CREATININE, URINE: 277 mg/dL (ref 20–370)
Microalb Creat Ratio: 15 mcg/mg creat (ref <30)
Microalb, Ur: 4.2 mg/dL

## 2015-12-18 LAB — HEMOGLOBIN A1C
HEMOGLOBIN A1C: 5.6 % (ref <5.7)
MEAN PLASMA GLUCOSE: 114 mg/dL

## 2015-12-18 MED ORDER — CARVEDILOL 25 MG PO TABS: 25.0000 mg | ORAL_TABLET | Freq: Two times a day (BID) | ORAL | 3 refills | Status: DC

## 2015-12-18 MED ORDER — POLYETHYLENE GLYCOL 3350 17 G PO PACK: 17.0000 g | PACK | Freq: Every day | ORAL | 3 refills | Status: DC | PRN

## 2015-12-18 MED ORDER — PANTOPRAZOLE SODIUM 40 MG PO TBEC: 40.0000 mg | DELAYED_RELEASE_TABLET | Freq: Every day | ORAL | 1 refills | Status: DC

## 2015-12-18 MED ORDER — POTASSIUM CHLORIDE CRYS ER 20 MEQ PO TBCR
20.0000 meq | EXTENDED_RELEASE_TABLET | Freq: Every day | ORAL | 3 refills | Status: DC
Start: 2015-12-18 — End: 2016-09-03

## 2015-12-18 MED ORDER — FUROSEMIDE 40 MG PO TABS: 40.0000 mg | ORAL_TABLET | Freq: Every day | ORAL | 3 refills | Status: DC

## 2015-12-18 MED ORDER — LISINOPRIL 20 MG PO TABS: 20.0000 mg | ORAL_TABLET | Freq: Every day | ORAL | 3 refills | Status: DC

## 2015-12-18 MED ORDER — ASPIRIN EC 81 MG PO TBEC: 81.0000 mg | DELAYED_RELEASE_TABLET | Freq: Every day | ORAL | 3 refills | Status: DC

## 2015-12-18 MED ORDER — PRAVASTATIN SODIUM 40 MG PO TABS: 40.0000 mg | ORAL_TABLET | Freq: Every day | ORAL | 3 refills | Status: DC

## 2015-12-18 MED ORDER — AMLODIPINE BESYLATE 5 MG PO TABS: 5.0000 mg | ORAL_TABLET | Freq: Every day | ORAL | 3 refills | Status: DC

## 2015-12-26 ENCOUNTER — Encounter (INDEPENDENT_AMBULATORY_CARE_PROVIDER_SITE_OTHER): Payer: Self-pay

## 2015-12-26 ENCOUNTER — Ambulatory Visit (HOSPITAL_COMMUNITY): Payer: PRIVATE HEALTH INSURANCE

## 2015-12-26 ENCOUNTER — Ambulatory Visit: Payer: PRIVATE HEALTH INSURANCE | Admitting: Cardiology

## 2015-12-26 NOTE — Progress Notes (Deleted)
[facility]. [address], [place]  [address] Phone: [phone] Fax:  [phone]  Date:  12/26/2015   ID:  Roger Martinez, DOB [birth year], MRN [id number]  PCP:  Harland Dingwall, NP    transition of care visit following hospitalization  History of Present Illness: Roger Martinez is a 50 y.o. male with prior history of three-vessel bypass in 2011, coronary artery disease, hypertension here for follow-up.ICD placed. Dr. Lovena Le patient.  Patient previously left AMA from hospital on 04/07/13 with new diagnosis of systolic heart failure with ejection fraction of 30-35%. His EF has been repeated and was 20/25 percent. Because of this, defibrillator was placed. He was quite thankful on today's visit. He seems to have turned the corner with his medical compliance. Excellent.  He was a Administrator. Bruise left of sternum. Basketball.  2 miles a day walking. Feels good. No shortness of breath, no chest pain.  11/27/14 - Not taking Entesto but every 2-3 days. Same with other heart failure medications. Anxiety medication does not agree with him. Held lasix for several days. Every 3 days he is taking coreg. Feels anxious if takes daily. He thinks he has been eating more. He feels well however, no shortness of breath  03/18/15 - on 02/05/15 he presented to the emergency room and left AMA, saw Dr. condition he went to be admitted for rule out MI dilated cardiopathy, LVH best approach would be for diagnostic cath. He had substernal chest pain, right-sided, noncompliant with medications.   Really wants help to stop smoking. pets why he came today he states.  Really why he is here. Wants patch. Tried one prior to Thanksgiving. [employer], just singed up. he is not having any shortness of breath, no chest pain currently, no syncope. No orthopnea.   05/30/15-here for transition of care visit posthospitalization and discharge on 05/23/15, 7 days ago with accelerated hypertension, acute systolic  heart failure, noncompliance. Hospital records reviewed. Lab work reviewed. Troponin mildly elevated consistent with demand ischemia, 0.14. Creatinine 1.06, hemoglobin 14.6. Original EKG on 05/21/15 demonstrated sinus tachycardia 125 bpm.  He is going to be establishing with a primary physician next Tuesday. His wife is helping him get this set up. We talked about fluid. He admits that he was drinking Gatorade before and after walking. This would not be a good idea for him given his cardiomyopathy. We discussed salt intake as well. This seemed to like new information to him. Telephone call made prior to this appointment and documented.   Wt Readings from Last 3 Encounters:  12/17/15 196 lb 3.2 oz (89 kg)  12/12/15 194 lb (88 kg)  11/23/15 183 lb 12.8 oz (83.4 kg)     Past Medical History:  Diagnosis Date  . Anxiety state, unspecified   . Chronic combined systolic (congestive) and diastolic (congestive) heart failure ([place])    a. 03/2014 Echo: EF 20-25%, diff HK, Gr 2 DD, mild AI, sev dil LA, mod reduced RV fxn, PASP 17mmHg.  Marland Kitchen Coronary artery disease    a. 2011 s/p CABG x 3.  . GERD (gastroesophageal reflux disease)    takes protonix q3 days as of 12/2015  . Hyperlipidemia   . Hypertension   . Hypertensive heart disease with heart failure ([place])   . Ischemic cardiomyopathy    a. 03/2014 Echo: EF 20-25%;  b. 06/2014 s/p BSX single lead AICD, ser # [id number].  . Marijuana abuse   . MI (myocardial infarction) ([place]) 03/2011  .  Noncompliance   . Perirectal abscess    once prior as of 12/2015  . Sciatica   . STD (sexually transmitted disease)   . Tobacco abuse   . Wears glasses     Past Surgical History:  Procedure Laterality Date  . BYPASS GRAFT  2011   3 vessel  . EP IMPLANTABLE DEVICE  06/17/2014   DR Caryl Martinez  . FOOT SURGERY     trauma repair, 50yo, lawnmower incident  . IMPLANTABLE CARDIOVERTER DEFIBRILLATOR IMPLANT N/A 06/17/2014   Procedure: IMPLANTABLE CARDIOVERTER DEFIBRILLATOR  IMPLANT;  Surgeon: Roger Sprang, MD;  Location: [facility] CATH LAB;  Service: Cardiovascular;  Laterality: N/A;  . INCISION AND DRAINAGE PERIRECTAL ABSCESS N/A 06/13/2015   Procedure: IRRIGATION AND DEBRIDEMENT PERIRECTAL ABSCESS;  Surgeon: Roger Boston, MD;  Location: [facility] ORS;  Service: General;  Laterality: N/A;    Current Outpatient Prescriptions  Medication Sig Dispense Refill  . amLODipine (NORVASC) 5 MG tablet Take 1 tablet (5 mg total) by mouth daily. 30 tablet 3  . aspirin EC 81 MG tablet Take 1 tablet (81 mg total) by mouth daily. 90 tablet 3  . carvedilol (COREG) 25 MG tablet Take 1 tablet (25 mg total) by mouth 2 (two) times daily with a meal. 60 tablet 3  . dicyclomine (BENTYL) 20 MG tablet Take 1 tablet (20 mg total) by mouth 2 (two) times daily. 20 tablet 0  . furosemide (LASIX) 40 MG tablet Take 1 tablet (40 mg total) by mouth daily. 30 tablet 3  . lisinopril (PRINIVIL,ZESTRIL) 20 MG tablet Take 1 tablet (20 mg total) by mouth daily. 30 tablet 3  . nitroGLYCERIN (NITROSTAT) 0.4 MG SL tablet Place 0.4 mg under the tongue every 5 (five) minutes as needed for chest pain.    . pantoprazole (PROTONIX) 40 MG tablet Take 1 tablet (40 mg total) by mouth daily. 90 tablet 1  . polyethylene glycol (MIRALAX) packet Take 17 g by mouth daily as needed. 100 each 3  . potassium chloride SA (K-DUR,KLOR-CON) 20 MEQ tablet Take 1 tablet (20 mEq total) by mouth daily. 30 tablet 3  . pravastatin (PRAVACHOL) 40 MG tablet Take 1 tablet (40 mg total) by mouth daily at 6 PM. 90 tablet 3   No current facility-administered medications for this visit.     Allergies:   No Known Allergies  Social History:  The patient  reports that he has quit smoking. He has a 23.00 pack-year smoking history. He has never used smokeless tobacco. He reports that he uses drugs, including Marijuana. 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 Hypertension Mother      deceased.  . Diabetes Mother   . Heart disease Mother   . Alzheimer's disease Father   . Gallbladder disease Sister   . Other Brother     drug abuse, cocaine  . Cancer Neg Hx   . Stroke Neg Hx     ROS:  Please see the history of present illness.   Denies any syncope, bleeding, positive orthopnea, positive PND, no fevers, chills, rash   All other systems reviewed and negative.   PHYSICAL EXAM: VS:  There were no vitals taken for this visit. Gen.: Well nourished, well developed, in no acute distress HEENT: normal, [place]/AT, EOMI Ext: no edema Skin: warm and dry, mild bruising left of sternum. Radial artery harvest site noted left arm CV: Regular rate and rhythm, no S3, no murmurs, minimal  JVD Lungs: Clear to auscultation bilaterally no rales GU: deferred Neuro: no focal abnormalities noted, AAO x 3  EKG:  03/20/14-sinus bradycardia rate 56, T-wave inversion in precordial/inferior leads-no change from prior 05/08/13 :Sinus rhythm rate 80 with T-wave inversion through precordial leads as well as inferior leads, possible ischemia. No significant change from prior.  Lab work, hospital records extensively reviewed. 3/16-creatinine 1.02, troponin normal, BNP 4500, LDL 118, hemoglobin 15.3  Echocardiogram:   05/08/15- Left ventricle: The cavity size was normal. Wall thickness was increased in a pattern of severe LVH. Systolic function was moderately to severely reduced. The estimated ejection fraction was in the range of 20-25%. Diffuse hypokinesis. - Aortic valve: Mild, moderate regurgitation. - Mitral valve: Mild regurgitation. - Left atrium: The atrium was moderately dilated. - Right atrium: The atrium was mildly dilated. - Pulmonary arteries: PA peak pressure: 74mm Hg (S).  ASSESSMENT AND PLAN:  History of coronary artery disease status post bypass surgery 3 in 2011 with chronic combined systolic heart failure EF 25-30% with hypertensive heart disease, ischemic cardiomyopathy status  post ICD in March 2016, tobacco use, history of noncompliance, accelerated hypertension, THC use, with recent discharge from hospital on 05/23/15.  1. Cardiomyopathy/chronic systolic heart failure-EF 25%-30.  Admitted December 2014 as well as February 2017 with acute systolic heart failure. Previously noncompliance. Truck driver. Defibrillator in place. Teary-eyed at times in the past.. Low-salt diet. Fluid restriction. Thankfully, he feels better. NYHA 1-2. He was started on Entresto by Roger Martinez. Postop secondary to cost during the hospitalization. Thankfully he  told me that he is taking him every 3 days. Try to encourage compliance however this is been an ongoing issue.  2. Coronary artery disease-post bypass/CABG in [facility].  No active anginal symptoms, right-sided chest pain previously in the emergency room, troponin was negative. He refused admission. 3. Essential hypertension- Continue to encourage medication use. Recent hospitalization demonstrated accelerated hypertension. Once he has been on medications, this improved. Lisinopril was titrated to 20 mg a day. Lasix. He did not take his medication she had this morning. 4. Nonsustained ventricular tachycardia noted during hospitalization. Beta blocker. ICD in place 5. Hyperlipidemia-was taking simvastatin 20 mg every other day. As he is having myalgias. He stopped taking it however. We can address this at future clinic visit, perhaps atorvastatin low-dose. 6. 2 week close follow-up, Roger Martinez  Signed, Candee Furbish, MD [facility]  12/26/2015 6:34 AM

## 2015-12-29 ENCOUNTER — Encounter: Payer: Self-pay | Admitting: Cardiology

## 2016-01-13 ENCOUNTER — Ambulatory Visit (HOSPITAL_COMMUNITY)
Admission: RE | Admit: 2016-01-13 | Discharge: 2016-01-13 | Disposition: A | Discharge status: Home / Self Care | Payer: PRIVATE HEALTH INSURANCE | Source: Ambulatory Visit | Attending: Vascular Surgery | Admitting: Vascular Surgery

## 2016-01-13 ENCOUNTER — Ambulatory Visit (INDEPENDENT_AMBULATORY_CARE_PROVIDER_SITE_OTHER)
Admission: RE | Admit: 2016-01-13 | Discharge: 2016-01-13 | Disposition: A | Discharge status: Home / Self Care | Payer: PRIVATE HEALTH INSURANCE | Source: Ambulatory Visit | Attending: Vascular Surgery | Admitting: Vascular Surgery

## 2016-01-13 ENCOUNTER — Ambulatory Visit (HOSPITAL_COMMUNITY): Payer: PRIVATE HEALTH INSURANCE

## 2016-01-13 ENCOUNTER — Encounter (HOSPITAL_COMMUNITY): Payer: PRIVATE HEALTH INSURANCE

## 2016-01-13 DIAGNOSIS — M79662 Pain in left lower leg: Secondary | ICD-10-CM | POA: Insufficient documentation

## 2016-01-13 DIAGNOSIS — M79661 Pain in right lower leg: Secondary | ICD-10-CM | POA: Diagnosis present

## 2016-01-13 DIAGNOSIS — I70203 Unspecified atherosclerosis of native arteries of extremities, bilateral legs: Secondary | ICD-10-CM | POA: Diagnosis not present

## 2016-01-22 ENCOUNTER — Telehealth: Payer: Self-pay | Admitting: *Deleted

## 2016-01-22 ENCOUNTER — Other Ambulatory Visit: Payer: Self-pay | Admitting: *Deleted

## 2016-01-22 DIAGNOSIS — R0989 Other specified symptoms and signs involving the circulatory and respiratory systems: Secondary | ICD-10-CM

## 2016-01-22 DIAGNOSIS — I739 Peripheral vascular disease, unspecified: Secondary | ICD-10-CM

## 2016-01-22 NOTE — Telephone Encounter (Signed)
Not sure why we didn't get the results sooner from the lab.  Apologize on our behalf but I didn't get results as timely as we should have gotten them.   The ultrasounds were abnormal.  Please refer [facility] for consult on femoral bruit, abnormal arterial screen of legs, and claudication.

## 2016-01-22 NOTE — Telephone Encounter (Signed)
Notified patient, Patient asked what was abnormal, advised him that vein and vascular would review results with him. Pt was very angry and rude with me, Pt asked for you to call him, advised him that I would ask you to call him when you get a chance but unsure of when since you are seeing patients,  He asked me when you would be leaving the building today, told him I didn't know, Patient then hung up the phone on me

## 2016-01-22 NOTE — Telephone Encounter (Signed)
Patient left vm requesting results of an ultrasound, image results are in, no results are charted

## 2016-01-22 NOTE — Telephone Encounter (Signed)
Please get in with Vein and Vascular ASAP.  Please let him know no later than Friday morning 01/23/16 when the appt is scheduled.     Also, please call the dept/office that did the study.  We weren't notified of results.   Although the result was found under imaging, I never received a call or in basket notification that we had results. I only realized the result was in the computer under imaging when the patient angrily called today as he hadn 't hear from [place] and the test was done 01/13/16.  Not sure what we/they can do to not let this happen again.     FYI  I had spoken to patient personally about 2:45pm today.  I advised that I understand he was upset.   We didn't get results promptly last week, but I did discuss the abnormality and that we will refer to vein and vascular ASAP.   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  He has PVD with significant findings on angiogram.

## 2016-01-22 NOTE — Telephone Encounter (Signed)
Patient has been schedule for the first available appt at [facility], 02/09/16.  Annette at [facility] notified pt of appt date/time, annette states that patient was okay with this appt date/time.

## 2016-02-05 ENCOUNTER — Encounter: Payer: Self-pay | Admitting: Cardiology

## 2016-02-05 ENCOUNTER — Encounter: Payer: Self-pay | Admitting: Surgery

## 2016-02-05 NOTE — Progress Notes (Deleted)
Cardiology Office Note   Date:  02/05/2016   ID:  Roger Martinez, DOB [birth year], MRN [id number]  PCP:  Roger Dingwall, NP  Cardiologist:  Dr. [name]    No chief complaint on file.     History of Present Illness: Roger Martinez is a 50 y.o. male who presents for HTN and CAD.  a hx of CAD status post CABG, dilated ischemic cardiomyopathy, combined systolic and diastolic CHF, hypertensive heart disease, HL, tobacco abuse. Patient was previously cared for in [place], [place]. He underwent angioplasty of the OM branch and subsequent non-STEMI. LHC demonstrated severe 3 vessel CAD and he underwent CABG (LIMA-LAD, SVG-OM2, free radial-PDA). He has known cardiomyopathy with EF 35% at the time of his bypass. He was previously a patient of Dr. Einar Martinez. He is now followed by Dr. [name] and Dr. Caryl Martinez. Ejection fraction has remained less than 35%. He underwent ICD implantation in 3/16.  Most recent Echo with EF 25% to 30% and G2DD. Mild to mod AR, mild MR  In August admitted with acute systolic HF and with NSTEMI, AKI, medication non compliance.  Also with lower ext dopplers with PAD.    Past Medical History:  Diagnosis Date  . Anxiety state, unspecified   . Chronic combined systolic (congestive) and diastolic (congestive) heart failure    a. 03/2014 Echo: EF 20-25%, diff HK, Gr 2 DD, mild AI, sev dil LA, mod reduced RV fxn, PASP 43mmHg.  Marland Kitchen Coronary artery disease    a. 2011 s/p CABG x 3.  . GERD (gastroesophageal reflux disease)    takes protonix q3 days as of 12/2015  . Hyperlipidemia   . Hypertension   . Hypertensive heart disease with heart failure ([place])   . Ischemic cardiomyopathy    a. 03/2014 Echo: EF 20-25%;  b. 06/2014 s/p BSX single lead AICD, ser # [id number].  . Marijuana abuse   . MI (myocardial infarction) 03/2011  . Noncompliance   . Perirectal abscess    once prior as of 12/2015  . Sciatica   . STD (sexually transmitted disease)   . Tobacco abuse   . Wears glasses     Past  Surgical History:  Procedure Laterality Date  . BYPASS GRAFT  2011   3 vessel  . EP IMPLANTABLE DEVICE  06/17/2014   DR Roger Martinez  . FOOT SURGERY     trauma repair, 50yo, lawnmower incident  . IMPLANTABLE CARDIOVERTER DEFIBRILLATOR IMPLANT N/A 06/17/2014   Procedure: IMPLANTABLE CARDIOVERTER DEFIBRILLATOR IMPLANT;  Surgeon: Roger Sprang, MD;  Location[facility] C[facility] LAB;  Service: Cardiovascular;  Laterality: N/A;  . INCISION AND DRAINAGE PERIRECTAL ABSCESS N/A 06/13/2015   Procedure: IRRIGATION AND DEBRIDEMENT PERIRECTAL ABSCESS;  Surgeon: Roger Boston, MD;  Location: [facility] ORS;  Service: General;  Laterality: N/A;     Current Outpatient Prescriptions  Medication Sig Dispense Refill  . amLODipine (NORVASC) 5 MG tablet Take 1 tablet (5 mg total) by mouth daily. 30 tablet 3  . aspirin EC 81 MG tablet Take 1 tablet (81 mg total) by mouth daily. 90 tablet 3  . carvedilol (COREG) 25 MG tablet Take 1 tablet (25 mg total) by mouth 2 (two) times daily with a meal. 60 tablet 3  . dicyclomine (BENTYL) 20 MG tablet Take 1 tablet (20 mg total) by mouth 2 (two) times daily. 20 tablet 0  . furosemide (LASIX) 40 MG tablet Take 1 tablet (40 mg total) by mouth daily. 30 tablet 3  . lisinopril (PRINIVIL,ZESTRIL) 20 MG tablet Take 1  tablet (20 mg total) by mouth daily. 30 tablet 3  . nitroGLYCERIN (NITROSTAT) 0.4 MG SL tablet Place 0.4 mg under the tongue every 5 (five) minutes as needed for chest pain.    . pantoprazole (PROTONIX) 40 MG tablet Take 1 tablet (40 mg total) by mouth daily. 90 tablet 1  . polyethylene glycol (MIRALAX) packet Take 17 g by mouth daily as needed. 100 each 3  . potassium chloride SA (K-DUR,KLOR-CON) 20 MEQ tablet Take 1 tablet (20 mEq total) by mouth daily. 30 tablet 3  . pravastatin (PRAVACHOL) 40 MG tablet Take 1 tablet (40 mg total) by mouth daily at 6 PM. 90 tablet 3   No current facility-administered medications for this visit.     Allergies:   Review of patient's allergies indicates  no known allergies.    Social History:  The patient  reports that he has quit smoking. He has a 23.00 pack-year smoking history. He has never used smokeless tobacco. He reports that he uses drugs, including Marijuana. He reports that he does not drink alcohol.   Family History:  The patient's ***family history includes Alzheimer's disease in his father; Diabetes in his mother; Gallbladder disease in his sister; Heart disease in his mother; Hypertension in his mother; Other in his brother.    ROS:  General:no colds or fevers, no weight changes Skin:no rashes or ulcers HEENT:no blurred vision, no congestion CV:see HPI PUL:see HPI GI:no diarrhea constipation or melena, no indigestion GU:no hematuria, no dysuria MS:no joint pain, no claudication Neuro:no syncope, no lightheadedness Endo:no diabetes, no thyroid disease Wt Readings from Last 3 Encounters:  12/17/15 196 lb 3.2 oz (89 kg)  12/12/15 194 lb (88 kg)  11/23/15 183 lb 12.8 oz (83.4 kg)     PHYSICAL EXAM: VS:  There were no vitals taken for this visit. , BMI There is no height or weight on file to calculate BMI. General:Pleasant affect, NAD Skin:Warm and dry, brisk capillary refill HEENT:normocephalic, sclera clear, mucus membranes moist Neck:supple, no JVD, no bruits  Heart:S1S2 RRR without murmur, gallup, rub or click Lungs:clear without rales, rhonchi, or wheezes [id number], non tender, + BS, do not palpate liver spleen or masses Ext:no lower ext edema, 2+ pedal pulses, 2+ radial pulses Neuro:alert and oriented, MAE, follows commands, + facial symmetry    EKG:  EKG is ordered today. The ekg ordered today demonstrates ***   Recent Labs: 11/21/2015: B Natriuretic Peptide 1,919.9 11/22/2015: Magnesium 2.0 12/12/2015: Hemoglobin 14.9; Platelets 238 12/17/2015: ALT 9; BUN 10; Creat 1.06; Potassium 4.7; Sodium 137    Lipid Panel    Component Value Date/Time   CHOL 175 12/17/2015 0928   TRIG 80 12/17/2015 0928   HDL 59  12/17/2015 0928   CHOLHDL 3.0 12/17/2015 0928   VLDL 16 12/17/2015 0928   LDLCALC 100 12/17/2015 0928       Other studies Reviewed: Additional studies/ records that were reviewed today include: ***.   ASSESSMENT AND PLAN:  1.  ***   Current medicines are reviewed with the patient today.  The patient Has no concerns regarding medicines.  The following changes have been made:  See above Labs/ tests ordered today include:see above  Disposition:   FU:  see above  Signed, Cecilie Kicks, NP  02/05/2016 10:53 PM    [facility] [place], [place], [address] [place], [place] Phone: [phone]; Fax: [phone]

## 2016-02-06 ENCOUNTER — Ambulatory Visit: Payer: PRIVATE HEALTH INSURANCE | Admitting: Cardiology

## 2016-02-09 ENCOUNTER — Ambulatory Visit (INDEPENDENT_AMBULATORY_CARE_PROVIDER_SITE_OTHER): Payer: PRIVATE HEALTH INSURANCE | Admitting: Surgery

## 2016-02-09 ENCOUNTER — Encounter: Payer: Self-pay | Admitting: Surgery

## 2016-02-09 VITALS — BP 170/94 | HR 62 | Temp 100.1°F | Resp 16 | Ht 69.5 in | Wt 195.0 lb

## 2016-02-09 DIAGNOSIS — I70213 Atherosclerosis of native arteries of extremities with intermittent claudication, bilateral legs: Secondary | ICD-10-CM | POA: Diagnosis not present

## 2016-02-09 NOTE — Progress Notes (Signed)
Vascular and Vein Specialist of [facility]  Patient name: Roger Martinez MRN: [id number] DOB: 10/04/1965 Sex: male  REFERRING PHYSICIAN: Tysinger  REASON FOR CONSULT: Claudication  HPI: Roger Martinez is a 50 y.o. male, who is referred today for claudication.  The patient states that when he walks approximately 1.7 Lennox Grumbles that his legs get very tight.  He was sent over for ultrasound evaluation which showed decreased blood flow to the left leg and so full consultation was requested.  The patient is walking for cardiac rehabilitation.  He has a history of CABG in 2011 as well as defibrillator placement in 2016.  His ejection fraction is 30-35%.  He denies any open wounds or ulcers.  He takes a statin for hypercholesterolemia and ACE inhibitor for hypertension.  He is a current smoker.  He states that he sometimes has difficulty affording his medications and finds it hard to make a decision between eating and taking his medications.  Past Medical History:  Diagnosis Date  . Anxiety state, unspecified   . Chronic combined systolic (congestive) and diastolic (congestive) heart failure    a. 03/2014 Echo: EF 20-25%, diff HK, Gr 2 DD, mild AI, sev dil LA, mod reduced RV fxn, PASP 67mmHg.  Marland Kitchen Coronary artery disease    a. 2011 s/p CABG x 3.  . GERD (gastroesophageal reflux disease)    takes protonix q3 days as of 12/2015  . Hyperlipidemia   . Hypertension   . Hypertensive heart disease with heart failure ([place])   . Ischemic cardiomyopathy    a. 03/2014 Echo: EF 20-25%;  b. 06/2014 s/p BSX single lead AICD, ser # [id number].  . Marijuana abuse   . MI (myocardial infarction) 03/2011  . Noncompliance   . Perirectal abscess    once prior as of 12/2015  . Sciatica   . STD (sexually transmitted disease)   . Tobacco abuse   . Wears glasses     Family History  Problem Relation Age of Onset  . Hypertension Mother     deceased.  . Diabetes Mother   . Heart disease  Mother   . Alzheimer's disease Father   . Gallbladder disease Sister   . Other Brother     drug abuse, cocaine  . Cancer Neg Hx   . Stroke Neg Hx     SOCIAL HISTORY: Social History   Social History  . Marital status: Married    Spouse name: N/A  . Number of children: N/A  . Years of education: N/A   Occupational History  . Not on file.   Social History Main Topics  . Smoking status: Current Some Day Smoker    Packs/day: 0.20    Years: 23.00  . Smokeless tobacco: Never Used  . Alcohol use No  . Drug use:     Types: Marijuana     Comment: none for 2 weeks  . Sexual activity: Not on file   Other Topics Concern  . Not on file   Social History Narrative   Lives in [place] with wife. Walking some for exercise.  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  Has 3 children in [place], [place].   Christian.  12/2015    No Known Allergies  Current Outpatient Prescriptions  Medication Sig Dispense Refill  . amLODipine (NORVASC) 5 MG tablet Take 1 tablet (5 mg total) by mouth daily. 30 tablet 3  . aspirin EC 81 MG tablet Take 1 tablet (  81 mg total) by mouth daily. 90 tablet 3  . carvedilol (COREG) 25 MG tablet Take 1 tablet (25 mg total) by mouth 2 (two) times daily with a meal. 60 tablet 3  . dicyclomine (BENTYL) 20 MG tablet Take 1 tablet (20 mg total) by mouth 2 (two) times daily. 20 tablet 0  . furosemide (LASIX) 40 MG tablet Take 1 tablet (40 mg total) by mouth daily. 30 tablet 3  . lisinopril (PRINIVIL,ZESTRIL) 20 MG tablet Take 1 tablet (20 mg total) by mouth daily. 30 tablet 3  . nitroGLYCERIN (NITROSTAT) 0.4 MG SL tablet Place 0.4 mg under the tongue every 5 (five) minutes as needed for chest pain.    . pantoprazole (PROTONIX) 40 MG tablet Take 1 tablet (40 mg total) by mouth daily. 90 tablet 1  . polyethylene glycol (MIRALAX) packet Take 17 g by mouth daily as needed. 100 each 3  . potassium chloride SA  (K-DUR,KLOR-CON) 20 MEQ tablet Take 1 tablet (20 mEq total) by mouth daily. 30 tablet 3  . pravastatin (PRAVACHOL) 40 MG tablet Take 1 tablet (40 mg total) by mouth daily at 6 PM. 90 tablet 3   No current facility-administered medications for this visit.     REVIEW OF SYSTEMS:  [X]  denotes positive finding, [ ]  denotes negative finding Cardiac  Comments:  Chest pain or chest pressure:    Shortness of breath upon exertion:    Short of breath when lying flat:    Irregular heart rhythm:        Vascular    Pain in calf, thigh, or hip brought on by ambulation: x   Pain in feet at night that wakes you up from your sleep:     Blood clot in your veins:    Leg swelling:         Pulmonary    Oxygen at home:    Productive cough:     Wheezing:         Neurologic    Sudden weakness in arms or legs:     Sudden numbness in arms or legs:     Sudden onset of difficulty speaking or slurred speech:    Temporary loss of vision in one eye:     Problems with dizziness:         Gastrointestinal    Blood in stool:     Vomited blood:         Genitourinary    Burning when urinating:     Blood in urine:        Psychiatric    Major depression:         Hematologic    Bleeding problems:    Problems with blood clotting too easily:        Skin    Rashes or ulcers:        Constitutional    Fever or chills:      PHYSICAL EXAM: Vitals:   02/09/16 1533 02/09/16 1536  BP: (!) 180/100 (!) 170/94  Pulse: 60 62  Resp: 16   Temp: 100.1 F (37.8 C)   TempSrc: Oral   SpO2: 99%   Weight: 195 lb (88.5 kg)   Height: 5' 9.5" (1.765 m)     GENERAL: The patient is a well-nourished male, in no acute distress. The vital signs are documented above. CARDIAC: There is a regular rate and rhythm.  VASCULAR: Nonpalpable pedal pulse on the left PULMONARY: There is good air exchange bilaterally without wheezing or rales.  ABDOMEN: Soft and non-tender with normal pitched bowel sounds.  MUSCULOSKELETAL:  There are no major deformities or cyanosis. NEUROLOGIC: No focal weakness or paresthesias are detected. SKIN: There are no ulcers or rashes noted. PSYCHIATRIC: The patient has a normal affect.  DATA:  I have reviewed his ultrasound shows an ABI 1.01 on the right with triphasic waveforms and 0.7 on the left with monophasic waveforms  ASSESSMENT AND PLAN: Claudication: The patient states that he is able to walk approximately 1.7 miles with cardiac rehabilitation.  Given that distance, I would not recommend any intervention at this time.  He needs to be managed medically.  I discussed the importance of taking his medications as well as the importance of smoking cessation.  I also offered him a referral back to her heart failure clinic for possible financial assistance and affording his medications.  I told him I would not intervene on his legs unless he developed significantly worse symptoms or nonhealing wound.  I spent a lot of time focusing on getting him to stop smoking as well as taking his medications.  He is scheduled to follow-up with me in 1 year with repeat ABIs   Annamarie Major, MD [facility] and [facility] Specialists of [facility] [phone] Pager [phone]

## 2016-02-10 ENCOUNTER — Other Ambulatory Visit: Payer: Self-pay | Admitting: *Deleted

## 2016-02-10 DIAGNOSIS — I739 Peripheral vascular disease, unspecified: Secondary | ICD-10-CM

## 2016-02-13 ENCOUNTER — Encounter: Payer: Self-pay | Admitting: Cardiology

## 2016-02-24 ENCOUNTER — Other Ambulatory Visit: Payer: Self-pay

## 2016-02-24 ENCOUNTER — Ambulatory Visit: Payer: PRIVATE HEALTH INSURANCE | Admitting: Gastroenterology

## 2016-03-01 ENCOUNTER — Telehealth: Payer: Self-pay | Admitting: Medical

## 2016-03-01 ENCOUNTER — Encounter: Payer: Self-pay | Admitting: Medical

## 2016-03-01 NOTE — Telephone Encounter (Signed)
Please call patient and inquire.  I received notification from [facility] that he either no showed or wasn't able to be contacted for their appointment in which we referred them.   Please make sure they are in agreement for the referral, and make sure they are aware that when we refer to a specialist, no showing or not being able to be reached can result in the specialist not taking them as a patient, make cause them to incur no show fees, and is just not appropriate in general to [place] or them.  Please either have them call back to set up the appointment or document their reply.

## 2016-03-01 NOTE — Telephone Encounter (Signed)
Called  To find out if he knew this appt. He said that he was aware of this appt, but wasn't able to go. Wanted me to email this information, I will call to get the appt. Set up and call patient back.

## 2016-03-01 NOTE — Telephone Encounter (Signed)
In the future, when I send no show message like this, just inform them about my message, and they can make the f/u appt.  We are not going to waste time making a second appt for them.   They can do that leg work

## 2016-03-01 NOTE — Telephone Encounter (Signed)
Called [facility] and  Make a another appt for him called him gave him this appt date and time. Also called and l/m the appt.  on his voicemail so he will have the information it he is not able to go . He can change it

## 2016-03-02 NOTE — Telephone Encounter (Signed)
Okay 

## 2016-03-09 ENCOUNTER — Other Ambulatory Visit: Payer: Self-pay | Admitting: Cardiology

## 2016-03-10 ENCOUNTER — Telehealth: Payer: Self-pay | Admitting: Cardiology

## 2016-03-10 MED ORDER — NITROSTAT 0.4 MG SL SUBL: 0.4000 mg | SUBLINGUAL_TABLET | SUBLINGUAL | 3 refills | Status: DC | PRN

## 2016-03-10 NOTE — Telephone Encounter (Signed)
Called pt and left message informing him that I gave a verbal order over the phone for Nitrostat 0.4 mg tablet to [facility] in [place], [place] and if he has any other problems, questions or concerns to call the office.

## 2016-03-10 NOTE — Telephone Encounter (Signed)
Called patient as I got a call from [place] about this. She stated he hung up phone before she was able to transfer to nurse. Patient was apparently upset regarding not being able to get through earlier today to [place] regarding request for a nitro SL refill. I have called patient back to try to find out if he is having any urgent symptoms which require assessment. Patient did not answer phone. Left msg for patient to call. Sending refill w request to contact office.

## 2016-03-10 NOTE — Telephone Encounter (Signed)
Sent to [facility] triage for f/u.

## 2016-03-10 NOTE — Telephone Encounter (Signed)
Tried calling pt also to see if he was having any problems but had to leave message.

## 2016-03-10 NOTE — Telephone Encounter (Signed)
°*  STAT* If patient is at the pharmacy, call can be transferred to refill team.   1. Which medications need to be refilled? (please list name of each medication and dose if known)Pt is in [facility] his nitroglycerin today please-Please call pt once you have called this ini  2. Which pharmacy/location (including street and city if local pharmacy) is medication to be sent to?[place]  3. Do they need a 30 day or 90 day supply? 1 bottle

## 2016-03-17 ENCOUNTER — Encounter: Payer: PRIVATE HEALTH INSURANCE | Admitting: Internal Medicine

## 2016-03-22 ENCOUNTER — Other Ambulatory Visit: Payer: Self-pay | Admitting: Cardiology

## 2016-03-23 NOTE — Telephone Encounter (Signed)
Pharmacy requesting a refill on viagra 100 mg tablet. Would you like to refill? Please advise

## 2016-03-23 NOTE — Telephone Encounter (Signed)
Will have Dr [name] review for OK to refill

## 2016-03-25 NOTE — Telephone Encounter (Signed)
Pharmacy requesting a refill on this medication. Would you like to refill this medication? Please advise

## 2016-03-26 NOTE — Telephone Encounter (Signed)
Today pt called in regards to status of this refill he is completely out of medication.

## 2016-04-20 ENCOUNTER — Ambulatory Visit (INDEPENDENT_AMBULATORY_CARE_PROVIDER_SITE_OTHER): Payer: PRIVATE HEALTH INSURANCE | Admitting: *Deleted

## 2016-04-20 DIAGNOSIS — I255 Ischemic cardiomyopathy: Secondary | ICD-10-CM | POA: Diagnosis not present

## 2016-04-20 NOTE — Progress Notes (Signed)
Remote ICD transmission.   

## 2016-04-21 ENCOUNTER — Encounter: Payer: Self-pay | Admitting: Cardiology

## 2016-04-23 ENCOUNTER — Other Ambulatory Visit: Payer: Self-pay

## 2016-04-23 ENCOUNTER — Ambulatory Visit: Payer: PRIVATE HEALTH INSURANCE | Admitting: Gastroenterology

## 2016-05-04 ENCOUNTER — Other Ambulatory Visit: Payer: Self-pay | Admitting: Cardiology

## 2016-05-11 ENCOUNTER — Telehealth: Payer: Self-pay | Admitting: Cardiology

## 2016-05-11 LAB — CUP PACEART REMOTE DEVICE CHECK
Battery Remaining Longevity: 144 mo
Battery Remaining Percentage: 100 %
Date Time Interrogation Session: 20180109055100
HIGH POWER IMPEDANCE MEASURED VALUE: 69 Ohm
Implantable Lead Location: 753860
Implantable Lead Serial Number: 349317
Lead Channel Impedance Value: 404 Ohm
Lead Channel Setting Pacing Amplitude: 4 V
Lead Channel Setting Pacing Pulse Width: 0.5 ms
MDC IDC LEAD IMPLANT DT: 20160307
MDC IDC MSMT LEADCHNL RV PACING THRESHOLD AMPLITUDE: 2 V
MDC IDC MSMT LEADCHNL RV PACING THRESHOLD PULSEWIDTH: 0.5 ms
MDC IDC PG IMPLANT DT: 20160307
MDC IDC SET LEADCHNL RV SENSING SENSITIVITY: 0.6 mV
MDC IDC STAT BRADY RV PERCENT PACED: 0 %
Pulse Gen Serial Number: 201422

## 2016-05-11 NOTE — Telephone Encounter (Signed)
Pt has not followed up as scheduled and in fact has had multiple no shows with both Dr [name] and APPs.  He will need to schedule to be seen in order to continue to receive refills of this medication.

## 2016-05-11 NOTE — Telephone Encounter (Signed)
Pt calling to request refill of Silbenasil to [facility]-

## 2016-05-11 NOTE — Telephone Encounter (Signed)
WANTS REFILL ON VIAGRA, PLEASE ADVISE, JUST GOT A REFILL IN DEC WITH A REFILL, THANKS

## 2016-05-11 NOTE — Telephone Encounter (Signed)
Patient Calls   Shellia Cleverly, RN 49 minutes ago (11:59 AM)      Pt has not followed up as scheduled and in fact has had multiple no shows with both Dr [name] and APPs.  He will need to schedule to be seen in order to continue to receive refills of this medication.       Documentation     You  Shellia Cleverly, RN 2 hours ago (10:07 AM)    Geraldine Solar REFILL ON VIAGRA, PLEASE ADVISE, JUST GOT A REFILL IN DEC WITH A REFILL, THANKS (Routing comment)     You 2 hours ago (10:06 AM)      WANTS REFILL ON VIAGRA, PLEASE ADVISE, JUST GOT A REFILL IN DEC WITH A REFILL, THANKS      Documentation     Conrad Abitz [phone]  You 2 hours ago (10:06 AM)    Incoming call:  WANTS REFILL ON VIAGRA, PLEASE ADVISE, JUST GOT A REFILL IN DEC WITH A REFILL, THANKS      Melissa A Tatum routed conversation to [facility] Refill 2 hours ago (9:52 AM)    Debbora Dus 2 hours ago (9:48 AM)      Pt calling to request refill of Silbenasil to [facility] [phone]  Lenna Sciara A Tatum    CALLED TO INFORM PT THAT HE WILL HAVE TO MAKE & COME INTO OFICE BEFORE VIAGRA WILL BE FILLED. LVM FOR PT.

## 2016-05-12 ENCOUNTER — Telehealth: Payer: Self-pay | Admitting: Cardiology

## 2016-05-12 NOTE — Telephone Encounter (Signed)
Spoke with patient and he has been scheduled 2/8 at 8:15 am.  He is aware of the appt and that he must keep this appt in order to have Viagra refilled.

## 2016-05-12 NOTE — Telephone Encounter (Signed)
Attempted to call patient - a man answered the phone and when I asked to speak with Roger Martinez there was no reply.  I said Hello several times with no response.  The call was then dropped. Of note - pt was last seen 4/27 by Roger Martinez.  He was to return in 2 weeks. 5/12 pt cancelled appt,with Roger Martinez 6/1 pt no showed, 9/15 pt no showed appt with Dr Roger Martinez, 10/27 pt no showed appt with Roger Martinez  He has been asked several times to schedule and keep his appts.  He will need to be seen for further refills of Viagra or obtain from his PCP.  Will continue to attempt to contact pt.

## 2016-05-12 NOTE — Telephone Encounter (Signed)
Roger Martinez is requesting a call back in regards to his Viagra medication. Thanks.

## 2016-05-12 NOTE — Telephone Encounter (Signed)
Follow Up:    Pt said he was talking to you and he was disconnected.

## 2016-05-20 ENCOUNTER — Encounter (INDEPENDENT_AMBULATORY_CARE_PROVIDER_SITE_OTHER): Payer: Self-pay

## 2016-05-20 ENCOUNTER — Ambulatory Visit (INDEPENDENT_AMBULATORY_CARE_PROVIDER_SITE_OTHER): Payer: PRIVATE HEALTH INSURANCE | Admitting: Cardiology

## 2016-05-20 ENCOUNTER — Encounter: Payer: Self-pay | Admitting: Cardiology

## 2016-05-20 VITALS — BP 166/98 | HR 66 | Ht 71.0 in | Wt 194.0 lb

## 2016-05-20 DIAGNOSIS — I5022 Chronic systolic (congestive) heart failure: Secondary | ICD-10-CM

## 2016-05-20 DIAGNOSIS — Z4502 Encounter for adjustment and management of automatic implantable cardiac defibrillator: Secondary | ICD-10-CM

## 2016-05-20 DIAGNOSIS — I110 Hypertensive heart disease with heart failure: Secondary | ICD-10-CM | POA: Diagnosis not present

## 2016-05-20 DIAGNOSIS — I2510 Atherosclerotic heart disease of native coronary artery without angina pectoris: Secondary | ICD-10-CM

## 2016-05-20 DIAGNOSIS — I255 Ischemic cardiomyopathy: Secondary | ICD-10-CM

## 2016-05-20 MED ORDER — SILDENAFIL CITRATE 100 MG PO TABS: 100.0000 mg | ORAL_TABLET | ORAL | 3 refills | Status: DC | PRN

## 2016-05-20 NOTE — Patient Instructions (Addendum)
Medication Instructions:  The current medical regimen is effective;  continue present plan and medications.   Follow-Up: Follow up in 4 months with Miguel Rota, PA.  You will receive a letter in the mail 2 months before you are due.  Please call us when you receive this letter to schedule your follow up appointment.   If you need a refill on your cardiac medications before your next appointment, please call your pharmacy.  Thank you for choosing [facility]!!

## 2016-05-20 NOTE — Progress Notes (Signed)
Roger Martinez, this is your patient.

## 2016-05-20 NOTE — Progress Notes (Signed)
[name] [address] [place], [place]  [address] Phone: [phone] Fax:  [phone]  Date:  05/20/2016   ID:  Roger Martinez, DOB [birth year], MRN [id number]  PCP:  Harland Dingwall, NP    transition of care visit following hospitalization  History of Present Illness: Roger Martinez is a 51 y.o. male with prior history of three-vessel bypass in 2011, coronary artery disease, hypertension here for follow-up.ICD placed. Dr. Lovena Le patient.  Patient previously left AMA from hospital on 04/07/13 with new diagnosis of systolic heart failure with ejection fraction of 30-35%. His EF has been repeated and was 20/25 percent. Because of this, defibrillator was placed. He was quite thankful on today's visit. He seems to have turned the corner with his medical compliance. Excellent.  Had a situation in [place]. Flew. First time with ICD. Had lovasatin on meds list. Pravastatin ok. Driving. Now.   He was a Administrator. Bruise left of sternum. Basketball.  2 miles a day walking. Feels good. No shortness of breath, no chest pain.  11/27/14 - Not taking Entesto but every 2-3 days. Same with other heart failure medications. Anxiety medication does not agree with him. Held lasix for several days. Every 3 days he is taking coreg. Feels anxious if takes daily. He thinks he has been eating more. He feels well however, no shortness of breath  03/18/15 - on 02/05/15 he presented to the emergency room and left AMA, saw Dr. condition he went to be admitted for rule out MI dilated cardiopathy, LVH best approach would be for diagnostic cath. He had substernal chest pain, right-sided, noncompliant with medications.   Really wants help to stop smoking. pets why he came today he states.  Really why he is here. Wants patch. Tried one prior to Thanksgiving. [employer], just singed up. he is not having any shortness of breath, no chest pain currently, no syncope. No orthopnea.   05/30/15-here for transition of  care visit posthospitalization and discharge on 05/23/15, 7 days ago with accelerated hypertension, acute systolic heart failure, noncompliance. Hospital records reviewed. Lab work reviewed. Troponin mildly elevated consistent with demand ischemia, 0.14. Creatinine 1.06, hemoglobin 14.6. Original EKG on 05/21/15 demonstrated sinus tachycardia 125 bpm.  He is going to be establishing with a primary physician next Tuesday. His wife is helping him get this set up. We talked about fluid. He admits that he was drinking Gatorade before and after walking. This would not be a good idea for him given his cardiomyopathy. We discussed salt intake as well. This seemed to like new information to him. Telephone call made prior to this appointment and documented.  05/20/16-he is back to driving, was able to save his house. No chest pain. No significant short of breath. He wants to be compliant with his medications. No bleeding.   Wt Readings from Last 3 Encounters:  05/20/16 194 lb (88 kg)  02/09/16 195 lb (88.5 kg)  12/17/15 196 lb 3.2 oz (89 kg)     Past Medical History:  Diagnosis Date  . Anxiety state, unspecified   . Chronic combined systolic (congestive) and diastolic (congestive) heart failure    a. 03/2014 Echo: EF 20-25%, diff HK, Gr 2 DD, mild AI, sev dil LA, mod reduced RV fxn, PASP 37mmHg.  Marland Kitchen Coronary artery disease    a. 2011 s/p CABG x 3.  . GERD (gastroesophageal reflux disease)    takes protonix q3 days as of 12/2015  . Hyperlipidemia   .  Hypertension   . Hypertensive heart disease with heart failure ([place])   . Ischemic cardiomyopathy    a. 03/2014 Echo: EF 20-25%;  b. 06/2014 s/p BSX single lead AICD, ser # [id number].  . Marijuana abuse   . MI (myocardial infarction) 03/2011  . Noncompliance   . Perirectal abscess    once prior as of 12/2015  . Sciatica   . STD (sexually transmitted disease)   . Tobacco abuse   . Wears glasses     Past Surgical History:  Procedure Laterality Date  .  BYPASS GRAFT  2011   3 vessel  . EP IMPLANTABLE DEVICE  06/17/2014   DR [name]  . FOOT SURGERY     trauma repair, 51yo, lawnmower incident  . IMPLANTABLE CARDIOVERTER DEFIBRILLATOR IMPLANT N/A 06/17/2014   Procedure: IMPLANTABLE CARDIOVERTER DEFIBRILLATOR IMPLANT;  Surgeon: Deboraha Sprang, MD;  Location: [facility] CATH LAB;  Service: Cardiovascular;  Laterality: N/A;  . INCISION AND DRAINAGE PERIRECTAL ABSCESS N/A 06/13/2015   Procedure: IRRIGATION AND DEBRIDEMENT PERIRECTAL ABSCESS;  Surgeon: Michael Boston, MD;  Location: [facility] ORS;  Service: General;  Laterality: N/A;    Current Outpatient Prescriptions  Medication Sig Dispense Refill  . amLODipine (NORVASC) 5 MG tablet Take 1 tablet (5 mg total) by mouth daily. 30 tablet 3  . aspirin EC 81 MG tablet Take 1 tablet (81 mg total) by mouth daily. 90 tablet 3  . carvedilol (COREG) 25 MG tablet Take 1 tablet (25 mg total) by mouth 2 (two) times daily with a meal. 60 tablet 3  . dicyclomine (BENTYL) 20 MG tablet Take 1 tablet (20 mg total) by mouth 2 (two) times daily. 20 tablet 0  . furosemide (LASIX) 40 MG tablet Take 1 tablet (40 mg total) by mouth daily. 30 tablet 3  . lisinopril (PRINIVIL,ZESTRIL) 20 MG tablet Take 1 tablet (20 mg total) by mouth daily. 30 tablet 3  . NITROSTAT 0.4 MG SL tablet Place 1 tablet (0.4 mg total) under the tongue every 5 (five) minutes as needed for chest pain. Please call office. 25 tablet 3  . pantoprazole (PROTONIX) 40 MG tablet Take 1 tablet (40 mg total) by mouth daily. 90 tablet 1  . polyethylene glycol (MIRALAX) packet Take 17 g by mouth daily as needed. 100 each 3  . potassium chloride SA (K-DUR,KLOR-CON) 20 MEQ tablet Take 1 tablet (20 mEq total) by mouth daily. 30 tablet 3  . pravastatin (PRAVACHOL) 40 MG tablet Take 1 tablet (40 mg total) by mouth daily at 6 PM. 90 tablet 3  . sildenafil (VIAGRA) 100 MG tablet Take 1 tablet (100 mg total) by mouth as needed for erectile dysfunction. 6 tablet 3   No current  facility-administered medications for this visit.     Allergies:   No Known Allergies  Social History:  The patient  reports that he has been smoking.  He has a 4.60 pack-year smoking history. He has never used smokeless tobacco. He reports that he uses drugs, including Marijuana. 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 Hypertension Mother     deceased.  . Diabetes Mother   . Heart disease Mother   . Alzheimer's disease Father   . Gallbladder disease Sister   . Other Brother     drug abuse, cocaine  . Cancer Neg Hx   . Stroke Neg Hx     ROS:  Please see the history of present illness.  Denies any syncope, bleeding, positive orthopnea, positive PND, no fevers, chills, rash   All other systems reviewed and negative.   PHYSICAL EXAM: VS:  BP (!) 166/98   Pulse 66   Ht 5\' 11"  (1.803 m)   Wt 194 lb (88 kg)   BMI 27.06 kg/m  Gen.: Well nourished, well developed, in no acute distress  HEENT: normal, [place]/AT, EOMI Ext: no edema Skin: warm and dry, mild bruising left of sternum. Radial artery harvest site noted left arm CV: Regular rate and rhythm, no S3, no murmurs, minimal JVD Lungs: Clear to auscultation bilaterally no rales GU: deferred Neuro: no focal abnormalities noted, AAO x 3  EKG:  03/20/14-sinus bradycardia rate 56, T-wave inversion in precordial/inferior leads-no change from prior 05/08/13 :Sinus rhythm rate 80 with T-wave inversion through precordial leads as well as inferior leads, possible ischemia. No significant change from prior.  Lab work, hospital records extensively reviewed. 3/16-creatinine 1.02, troponin normal, BNP 4500, LDL 118, hemoglobin 15.3  Echocardiogram:   05/08/15- Left ventricle: The cavity size was normal. Wall thickness was increased in a pattern of severe LVH. Systolic function was moderately to severely reduced. The estimated ejection fraction was in the range of 20-25%.  Diffuse hypokinesis. - Aortic valve: Mild, moderate regurgitation. - Mitral valve: Mild regurgitation. - Left atrium: The atrium was moderately dilated. - Right atrium: The atrium was mildly dilated. - Pulmonary arteries: PA peak pressure: 86mm Hg (S).  ASSESSMENT AND PLAN:  History of coronary artery disease status post bypass surgery 3 in 2011 with chronic combined systolic heart failure EF 25-30% with hypertensive heart disease, ischemic cardiomyopathy status post ICD in March 2016, tobacco use, history of noncompliance, accelerated hypertension, THC use, with recent discharge from hospital on 05/23/15.  1. Cardiomyopathy/chronic systolic heart failure-EF 25%-30.  Admitted December 2014 as well as February 2017 with acute systolic heart failure. Previously noncompliance. Truck driver. Defibrillator in place. Teary-eyed at times in the past.. Low-salt diet. Fluid restriction. Thankfully, he feels better. NYHA 1-2. He was started on Entresto by Dr. [name]. Postop secondary to cost during the hospitalization.Try to encourage compliance however this is been an ongoing issue.  2. Coronary artery disease-post bypass/CABG in [facility].  No active anginal symptoms, right-sided chest pain previously in the emergency room, troponin was negative. He refused admission. 3. Essential hypertension- Continue to encourage medication use. Recent hospitalization demonstrated accelerated hypertension. Once he has been on medications, this improved. Lisinopril was titrated to 20 mg a day. Lasix. He did not take his medication she had this morning. He has not taken her medications yet this morning. 4. Nonsustained ventricular tachycardia noted during hospitalization. Beta blocker. ICD in place 5. Hyperlipidemia-pravastatin 40 mg. He had duplication with lovastatin 20 on his medication list. Stopping lovastatin. 6. 2 week close follow-up, Scott  Signed, Candee Furbish, MD [facility]  05/20/2016 9:38 AM

## 2016-06-04 ENCOUNTER — Encounter (HOSPITAL_COMMUNITY): Payer: Self-pay | Admitting: Emergency Medicine

## 2016-06-04 DIAGNOSIS — I5042 Chronic combined systolic (congestive) and diastolic (congestive) heart failure: Secondary | ICD-10-CM | POA: Diagnosis not present

## 2016-06-04 DIAGNOSIS — Z951 Presence of aortocoronary bypass graft: Secondary | ICD-10-CM | POA: Diagnosis not present

## 2016-06-04 DIAGNOSIS — I252 Old myocardial infarction: Secondary | ICD-10-CM | POA: Insufficient documentation

## 2016-06-04 DIAGNOSIS — I2510 Atherosclerotic heart disease of native coronary artery without angina pectoris: Secondary | ICD-10-CM | POA: Diagnosis not present

## 2016-06-04 DIAGNOSIS — K5900 Constipation, unspecified: Secondary | ICD-10-CM | POA: Insufficient documentation

## 2016-06-04 DIAGNOSIS — Z79899 Other long term (current) drug therapy: Secondary | ICD-10-CM | POA: Insufficient documentation

## 2016-06-04 DIAGNOSIS — I110 Hypertensive heart disease with heart failure: Secondary | ICD-10-CM | POA: Insufficient documentation

## 2016-06-04 DIAGNOSIS — Z7982 Long term (current) use of aspirin: Secondary | ICD-10-CM | POA: Diagnosis not present

## 2016-06-04 DIAGNOSIS — F17200 Nicotine dependence, unspecified, uncomplicated: Secondary | ICD-10-CM | POA: Insufficient documentation

## 2016-06-04 DIAGNOSIS — R0789 Other chest pain: Principal | ICD-10-CM | POA: Insufficient documentation

## 2016-06-04 LAB — BASIC METABOLIC PANEL
Anion gap: 8 (ref 5–15)
BUN: 14 mg/dL (ref 6–20)
CHLORIDE: 107 mmol/L (ref 101–111)
CO2: 24 mmol/L (ref 22–32)
Calcium: 9.5 mg/dL (ref 8.9–10.3)
Creatinine, Ser: 1.3 mg/dL — ABNORMAL HIGH (ref 0.61–1.24)
GFR calc Af Amer: >60 mL/min (ref >60)
GFR calc non Af Amer: >60 mL/min (ref >60)
GLUCOSE: 133 mg/dL — AB (ref 65–99)
POTASSIUM: 3.8 mmol/L (ref 3.5–5.1)
Sodium: 139 mmol/L (ref 135–145)

## 2016-06-04 LAB — LIPASE, BLOOD: Lipase: 55 U/L — ABNORMAL HIGH (ref 11–51)

## 2016-06-04 LAB — CBC
HCT: 44.6 % (ref 39.0–52.0)
HEMOGLOBIN: 15.2 g/dL (ref 13.0–17.0)
MCH: 31.7 pg (ref 26.0–34.0)
MCHC: 34.1 g/dL (ref 30.0–36.0)
MCV: 92.9 fL (ref 78.0–100.0)
Platelets: 249 10*3/uL (ref 150–400)
RBC: 4.8 MIL/uL (ref 4.22–5.81)
RDW: 13.6 % (ref 11.5–15.5)
WBC: 9.8 10*3/uL (ref 4.0–10.5)

## 2016-06-04 LAB — I-STAT TROPONIN, ED: Troponin i, poc: 0.05 ng/mL (ref 0.00–0.08)

## 2016-06-04 NOTE — ED Triage Notes (Signed)
Pt presents to ED after having 4 days of constipation.  Patient states he took an enema and two suppositiories with only a small amount of stool.  Patient feels full.  He also c/o some chest pressure earlier, took 2 nitros with full relief.  Then had a headache which has since resolved.  Patient seeking help with his constipation.

## 2016-06-05 ENCOUNTER — Emergency Department (HOSPITAL_COMMUNITY): Payer: PRIVATE HEALTH INSURANCE

## 2016-06-05 ENCOUNTER — Observation Stay (HOSPITAL_COMMUNITY)
Admission: EM | Admit: 2016-06-05 | Discharge: 2016-06-07 | Disposition: A | Discharge status: Home / Self Care | Payer: PRIVATE HEALTH INSURANCE | Attending: Internal Medicine | Admitting: Internal Medicine

## 2016-06-05 DIAGNOSIS — I5043 Acute on chronic combined systolic (congestive) and diastolic (congestive) heart failure: Secondary | ICD-10-CM | POA: Diagnosis not present

## 2016-06-05 DIAGNOSIS — I10 Essential (primary) hypertension: Secondary | ICD-10-CM | POA: Diagnosis present

## 2016-06-05 DIAGNOSIS — K5900 Constipation, unspecified: Secondary | ICD-10-CM

## 2016-06-05 DIAGNOSIS — J9601 Acute respiratory failure with hypoxia: Secondary | ICD-10-CM | POA: Diagnosis not present

## 2016-06-05 DIAGNOSIS — N179 Acute kidney failure, unspecified: Secondary | ICD-10-CM

## 2016-06-05 DIAGNOSIS — J9602 Acute respiratory failure with hypercapnia: Secondary | ICD-10-CM | POA: Diagnosis not present

## 2016-06-05 DIAGNOSIS — R079 Chest pain, unspecified: Secondary | ICD-10-CM

## 2016-06-05 DIAGNOSIS — I161 Hypertensive emergency: Secondary | ICD-10-CM

## 2016-06-05 DIAGNOSIS — I2510 Atherosclerotic heart disease of native coronary artery without angina pectoris: Secondary | ICD-10-CM

## 2016-06-05 DIAGNOSIS — J9600 Acute respiratory failure, unspecified whether with hypoxia or hypercapnia: Secondary | ICD-10-CM | POA: Diagnosis present

## 2016-06-05 LAB — I-STAT ARTERIAL BLOOD GAS, ED
Acid-base deficit: 16 mmol/L — ABNORMAL HIGH (ref 0.0–2.0)
Bicarbonate: 17.4 mmol/L — ABNORMAL LOW (ref 20.0–28.0)
O2 Saturation: 99 %
PCO2 ART: 71.7 mmHg — AB (ref 32.0–48.0)
PH ART: 6.991 — AB (ref 7.350–7.450)
TCO2: 20 mmol/L (ref 0–100)
pO2, Arterial: 249 mmHg — ABNORMAL HIGH (ref 83.0–108.0)

## 2016-06-05 LAB — CBC
HCT: 49.5 % (ref 39.0–52.0)
Hemoglobin: 17 g/dL (ref 13.0–17.0)
MCH: 32.1 pg (ref 26.0–34.0)
MCHC: 34.3 g/dL (ref 30.0–36.0)
MCV: 93.6 fL (ref 78.0–100.0)
Platelets: 251 10*3/uL (ref 150–400)
RBC: 5.29 MIL/uL (ref 4.22–5.81)
RDW: 13.5 % (ref 11.5–15.5)
WBC: 5.6 10*3/uL (ref 4.0–10.5)

## 2016-06-05 LAB — I-STAT TROPONIN, ED: Troponin i, poc: 0.04 ng/mL (ref 0.00–0.08)

## 2016-06-05 LAB — LACTIC ACID, PLASMA
LACTIC ACID, VENOUS: 2.7 mmol/L — AB (ref 0.5–1.9)
Lactic Acid, Venous: 1.1 mmol/L (ref 0.5–1.9)

## 2016-06-05 LAB — BRAIN NATRIURETIC PEPTIDE: B NATRIURETIC PEPTIDE 5: 1422.2 pg/mL — AB (ref 0.0–100.0)

## 2016-06-05 LAB — TROPONIN I
TROPONIN I: 0.07 ng/mL — AB (ref <0.03)
TROPONIN I: 0.22 ng/mL — AB (ref <0.03)
Troponin I: 0.12 ng/mL (ref <0.03)

## 2016-06-05 LAB — CREATININE, SERUM
Creatinine, Ser: 1.4 mg/dL — ABNORMAL HIGH (ref 0.61–1.24)
GFR calc Af Amer: >60 mL/min (ref >60)
GFR, EST NON AFRICAN AMERICAN: 58 mL/min — AB (ref >60)

## 2016-06-05 MED ORDER — MAGNESIUM CITRATE PO SOLN
1.0000 | Freq: Once | ORAL | Status: AC
  Administered 2016-06-05: 1 via ORAL
  Filled 2016-06-05: qty 296

## 2016-06-05 MED ORDER — ASPIRIN 81 MG PO CHEW
324.0000 mg | CHEWABLE_TABLET | Freq: Once | ORAL | Status: AC
  Administered 2016-06-05: 324 mg via ORAL
  Filled 2016-06-05: qty 4

## 2016-06-05 MED ORDER — CARVEDILOL 12.5 MG PO TABS
25.0000 mg | ORAL_TABLET | Freq: Once | ORAL | Status: AC
  Administered 2016-06-05: 25 mg via ORAL
  Filled 2016-06-05: qty 2

## 2016-06-05 MED ORDER — MORPHINE SULFATE (PF) 4 MG/ML IV SOLN
2.0000 mg | INTRAVENOUS | Status: DC | PRN
Start: 2016-06-05 — End: 2016-06-06

## 2016-06-05 MED ORDER — DEXTROSE 5 % IV SOLN
0.5000 mg/min | INTRAVENOUS | Status: DC
  Administered 2016-06-05: 0.5 mg/min via INTRAVENOUS
  Filled 2016-06-05: qty 100

## 2016-06-05 MED ORDER — ONDANSETRON HCL 4 MG/2ML IJ SOLN: 4.0000 mg | Freq: Four times a day (QID) | INTRAMUSCULAR | Status: DC | PRN

## 2016-06-05 MED ORDER — CARVEDILOL 25 MG PO TABS
25.0000 mg | ORAL_TABLET | Freq: Two times a day (BID) | ORAL | Status: DC
  Administered 2016-06-05 – 2016-06-07 (×4): 25 mg via ORAL
  Filled 2016-06-05 (×4): qty 1

## 2016-06-05 MED ORDER — BISACODYL 5 MG PO TBEC
5.0000 mg | DELAYED_RELEASE_TABLET | Freq: Every day | ORAL | Status: DC | PRN
  Administered 2016-06-05: 5 mg via ORAL
  Filled 2016-06-05 (×2): qty 1

## 2016-06-05 MED ORDER — FUROSEMIDE 10 MG/ML IJ SOLN: 40.0000 mg | Freq: Two times a day (BID) | INTRAMUSCULAR | Status: DC

## 2016-06-05 MED ORDER — GI COCKTAIL ~~LOC~~
30.0000 mL | Freq: Four times a day (QID) | ORAL | Status: DC | PRN
  Administered 2016-06-06: 30 mL via ORAL
  Filled 2016-06-05: qty 30

## 2016-06-05 MED ORDER — LISINOPRIL 20 MG PO TABS
20.0000 mg | ORAL_TABLET | Freq: Every day | ORAL | Status: DC
  Administered 2016-06-06 – 2016-06-07 (×2): 20 mg via ORAL
  Filled 2016-06-05 (×2): qty 1

## 2016-06-05 MED ORDER — POLYETHYLENE GLYCOL 3350 17 G PO PACK
17.0000 g | PACK | Freq: Every day | ORAL | Status: DC
  Administered 2016-06-05 – 2016-06-06 (×2): 17 g via ORAL
  Filled 2016-06-05 (×3): qty 1

## 2016-06-05 MED ORDER — DOCUSATE SODIUM 100 MG PO CAPS
100.0000 mg | ORAL_CAPSULE | Freq: Once | ORAL | Status: AC
  Administered 2016-06-05: 100 mg via ORAL
  Filled 2016-06-05: qty 1

## 2016-06-05 MED ORDER — NITROGLYCERIN IN D5W 200-5 MCG/ML-% IV SOLN
5.0000 ug/min | INTRAVENOUS | Status: DC
  Administered 2016-06-05: 10 ug/min via INTRAVENOUS
  Filled 2016-06-05: qty 250

## 2016-06-05 MED ORDER — LISINOPRIL 20 MG PO TABS
20.0000 mg | ORAL_TABLET | Freq: Once | ORAL | Status: AC
  Administered 2016-06-05: 20 mg via ORAL
  Filled 2016-06-05: qty 1

## 2016-06-05 MED ORDER — POLYETHYLENE GLYCOL 3350 17 G PO PACK: 17.0000 g | PACK | Freq: Every day | ORAL | Status: DC | PRN

## 2016-06-05 MED ORDER — NITROGLYCERIN 2 % TD OINT
1.0000 [in_us] | TOPICAL_OINTMENT | Freq: Once | TRANSDERMAL | Status: AC
  Administered 2016-06-05: 1 [in_us] via TOPICAL
  Filled 2016-06-05: qty 1

## 2016-06-05 MED ORDER — LORAZEPAM 2 MG/ML IJ SOLN
0.5000 mg | Freq: Once | INTRAMUSCULAR | Status: AC
  Administered 2016-06-05: 0.5 mg via INTRAVENOUS
  Filled 2016-06-05: qty 1

## 2016-06-05 MED ORDER — PRAVASTATIN SODIUM 40 MG PO TABS
40.0000 mg | ORAL_TABLET | Freq: Every day | ORAL | Status: DC
  Administered 2016-06-05 – 2016-06-06 (×2): 40 mg via ORAL
  Filled 2016-06-05 (×2): qty 1

## 2016-06-05 MED ORDER — AMLODIPINE BESYLATE 5 MG PO TABS
5.0000 mg | ORAL_TABLET | Freq: Once | ORAL | Status: AC
  Administered 2016-06-05: 5 mg via ORAL
  Filled 2016-06-05: qty 1

## 2016-06-05 MED ORDER — FUROSEMIDE 20 MG PO TABS
40.0000 mg | ORAL_TABLET | Freq: Once | ORAL | Status: AC
  Administered 2016-06-05: 40 mg via ORAL
  Filled 2016-06-05: qty 2

## 2016-06-05 MED ORDER — DICYCLOMINE HCL 20 MG PO TABS
20.0000 mg | ORAL_TABLET | Freq: Two times a day (BID) | ORAL | Status: DC
  Administered 2016-06-05 – 2016-06-06 (×3): 20 mg via ORAL
  Filled 2016-06-05 (×4): qty 1

## 2016-06-05 MED ORDER — PANTOPRAZOLE SODIUM 40 MG PO TBEC
40.0000 mg | DELAYED_RELEASE_TABLET | Freq: Every day | ORAL | Status: DC
  Administered 2016-06-05 – 2016-06-07 (×3): 40 mg via ORAL
  Filled 2016-06-05 (×3): qty 1

## 2016-06-05 MED ORDER — ACETAMINOPHEN 325 MG PO TABS
650.0000 mg | ORAL_TABLET | ORAL | Status: DC | PRN
  Administered 2016-06-06: 650 mg via ORAL
  Filled 2016-06-05: qty 2

## 2016-06-05 MED ORDER — AMLODIPINE BESYLATE 5 MG PO TABS: 5.0000 mg | ORAL_TABLET | Freq: Every day | ORAL | Status: DC

## 2016-06-05 MED ORDER — HYDRALAZINE HCL 20 MG/ML IJ SOLN
10.0000 mg | Freq: Once | INTRAMUSCULAR | Status: AC
  Administered 2016-06-05: 10 mg via INTRAVENOUS
  Filled 2016-06-05: qty 1

## 2016-06-05 MED ORDER — POLYETHYLENE GLYCOL 3350 17 G PO PACK
17.0000 g | PACK | Freq: Once | ORAL | Status: AC
  Administered 2016-06-05: 17 g via ORAL
  Filled 2016-06-05: qty 1

## 2016-06-05 MED ORDER — FUROSEMIDE 10 MG/ML IJ SOLN
40.0000 mg | Freq: Once | INTRAMUSCULAR | Status: AC
  Administered 2016-06-05: 40 mg via INTRAVENOUS
  Filled 2016-06-05: qty 4

## 2016-06-05 MED ORDER — ASPIRIN EC 81 MG PO TBEC
81.0000 mg | DELAYED_RELEASE_TABLET | Freq: Every day | ORAL | Status: DC
  Administered 2016-06-05: 81 mg via ORAL
  Filled 2016-06-05: qty 1

## 2016-06-05 MED ORDER — HEPARIN SODIUM (PORCINE) 5000 UNIT/ML IJ SOLN
5000.0000 [IU] | Freq: Three times a day (TID) | INTRAMUSCULAR | Status: DC
  Administered 2016-06-05 – 2016-06-06 (×4): 5000 [IU] via SUBCUTANEOUS
  Filled 2016-06-05 (×4): qty 1

## 2016-06-05 MED ORDER — ALUM & MAG HYDROXIDE-SIMETH 200-200-20 MG/5ML PO SUSP
30.0000 mL | Freq: Once | ORAL | Status: AC
  Administered 2016-06-05: 30 mL via ORAL
  Filled 2016-06-05: qty 30

## 2016-06-05 MED ORDER — FUROSEMIDE 10 MG/ML IJ SOLN
20.0000 mg | Freq: Two times a day (BID) | INTRAMUSCULAR | Status: DC
  Administered 2016-06-05: 20 mg via INTRAVENOUS
  Filled 2016-06-05: qty 2

## 2016-06-05 MED ORDER — POTASSIUM CHLORIDE CRYS ER 20 MEQ PO TBCR
20.0000 meq | EXTENDED_RELEASE_TABLET | Freq: Every day | ORAL | Status: DC
  Administered 2016-06-05 – 2016-06-07 (×3): 20 meq via ORAL
  Filled 2016-06-05 (×3): qty 1

## 2016-06-05 NOTE — ED Notes (Signed)
Pt requesting to see this RN. Pt stating, " I just am going to leave if I am just going to be sitting here. They want to keep sticking me and it is all the same. I just came here for my constipation. I'm not hurting anymore. My chest isn't hurting and my breathing is fine. I want to go home." Pt reassured that this RN is taking care of patient how he would be taken care of if he had a room upstairs. Pt explained that his heart is in distress and through the testing for his constipation we were able to find this. Pt educated that medications and blood draws are done routinely to ensure the correct treatment plan and to stabilize his body so that he is safe to be discharged. Pt continue to state that he would just like to go home and he doesn't need to stay here.

## 2016-06-05 NOTE — Progress Notes (Signed)
Came to eval pt, pt is now off bipap.  Sat 95% on 2 lpm [place].  No distress noted, BBS[facility] good air movement, fine crackles.  Pt denies SOB.  Pt states he wants to go home- RN notified.

## 2016-06-05 NOTE — ED Notes (Signed)
Pt requesting lasix, MD notified.

## 2016-06-05 NOTE — ED Notes (Signed)
Pt stated he was leaving AMA and allowed this nurse to take off medication and start the process of getting him checked out. Admitting MD made aware. Pt had stated, "I come to this hospital and this happens everytime. They stick me more than once and because she has her make up all pretty I am supposed to let her try again. I came here for constipation and my chest feels fine. Look at my blood pressure. I have been dealing with this for eleven years. I want to go home. I have been telling you that." Pt explained about the risks of leaving the hospital and the process of giving medication and blood draws periodically to ensure therapeutic treatment. Pt continued to insist that he leaves. Right as this RN was to take out patient's IV, PT stated quietly, "I'll go upstairs."

## 2016-06-05 NOTE — Progress Notes (Signed)
Pt arrived on the unit, Nitro gtt infusing. C/o constipation and wanted to try to eat some lunch. Orders released lunch tray ordered. Placed on monitor. Oriented to the room and unit.

## 2016-06-05 NOTE — ED Notes (Signed)
Admitting MD at the bedside.  

## 2016-06-05 NOTE — Progress Notes (Signed)
Placed patient on BiPap for decreased SpO2 in 70s on NRB, and apparent respiratory distress. RT will continue to monitor.

## 2016-06-05 NOTE — Plan of Care (Signed)
PCCM coming to evaluate patient who has decompensated significantly in ED.  Will put sign out in [facility] and let flow management know for day admission team to follow up on their evaluation.

## 2016-06-05 NOTE — ED Notes (Signed)
Per MD wait until 0600 to draw troponin

## 2016-06-05 NOTE — ED Notes (Signed)
MD at the bedside  

## 2016-06-05 NOTE — ED Notes (Signed)
Pt taken off of BiPap. Tolerating 4L [place] at this time with no trouble. Pt talking in full sentences and drinking juice per MD approval and request.

## 2016-06-05 NOTE — ED Provider Notes (Addendum)
TIME SEEN:  By signing my name below, I, Arianna Nassar, attest that this documentation has been prepared under the direction and in the presence of [employer], DO.  Electronically Signed: Julien Nordmann, ED Scribe. 06/05/16. 1:07 AM.   CHIEF COMPLAINT:  Chief Complaint  Patient presents with  . Abdominal Pain  . Constipation  . Chest Pain  . Headache     HPI:  HPI Comments: Roger Martinez is a 51 y.o. male who has a PMhx of NSTEMI, hypertensive heart disease with heart failure, ischemic cardiomyopathy, CAD, HLD, presents to the Emergency Department complaining of intermittent, moderate, epigastric abdominal pain that began Thursday evening ( > 24 hours ago). Pt does not have any pain currently. He states having tightness and pressure in epigastric abdominal region and lower chest causing problems That he states feels different than his prior anginal equivalent. He reports his pain started around 3:00 pm Thursday afternoon and resolved around 9:00pm this evening. States pain has been intermittent for over 24 hours. Pt states taking nitroglycerin to relieve his chest pressure with temporary relief but states he began to have diaphoresis. He notes his current symptoms are different than his usual cardiac symptoms. He has a PMHx of MI in the past and reports his symptoms were mildly similar, yet different to his prior episodes. Pt expresses his symptoms are worse when he is laying down. He says that his abdominal pain makes his chest pain worse. He states he thinks it is constipation that is causing him to have pain in his chest. He has no shortness of breath. Has had nausea but without vomiting.  Pt also expresses not being able to have a bowel movement for the past two days. He states his last normal bowel movement was 2 days ago and states he was able to pass a "small pebble" today. Pt is not able to pass flatus. He has a hx of constipation once in the past. Per wife, pt has used two Dulcolax  suppositories as well as enemas (once last night and once tonight) to help with his constipation without relief. He has no hx of abdominal surgeries or hx of small bowel obstructions. Pt does not have a hx of pancreatitis in the past. He further denies fever, chills, and shortness of breath.  ROS: See HPI Constitutional: no fever  Eyes: no drainage  ENT: no runny nose   Cardiovascular:  (+) chest pain  Resp: no SOB  GI: no vomiting GU: no dysuria Integumentary: no rash  Allergy: no hives  Musculoskeletal: no leg swelling  Neurological: no slurred speech ROS otherwise negative  PAST MEDICAL HISTORY/PAST SURGICAL HISTORY:  Past Medical History:  Diagnosis Date  . Anxiety state, unspecified   . Chronic combined systolic (congestive) and diastolic (congestive) heart failure    a. 03/2014 Echo: EF 20-25%, diff HK, Gr 2 DD, mild AI, sev dil LA, mod reduced RV fxn, PASP 35mmHg.  Marland Kitchen Coronary artery disease    a. 2011 s/p CABG x 3.  . GERD (gastroesophageal reflux disease)    takes protonix q3 days as of 12/2015  . Hyperlipidemia   . Hypertension   . Hypertensive heart disease with heart failure ([place])   . Ischemic cardiomyopathy    a. 03/2014 Echo: EF 20-25%;  b. 06/2014 s/p BSX single lead AICD, ser # [id number].  . Marijuana abuse   . MI (myocardial infarction) 03/2011  . Noncompliance   . Perirectal abscess    once prior as of 12/2015  .  Sciatica   . STD (sexually transmitted disease)   . Tobacco abuse   . Wears glasses     MEDICATIONS:  Prior to Admission medications   Medication Sig Start Date End Date Taking? Authorizing Provider  amLODipine (NORVASC) 5 MG tablet Take 1 tablet (5 mg total) by mouth daily. 12/18/15   Carlena Hurl, PA-C  aspirin EC 81 MG tablet Take 1 tablet (81 mg total) by mouth daily. 12/18/15   Camelia Eng Tysinger, PA-C  carvedilol (COREG) 25 MG tablet Take 1 tablet (25 mg total) by mouth 2 (two) times daily with a meal. 12/18/15   Camelia Eng Tysinger, PA-C   dicyclomine (BENTYL) 20 MG tablet Take 1 tablet (20 mg total) by mouth 2 (two) times daily. 12/12/15   Olivia Canter Sam, PA-C  furosemide (LASIX) 40 MG tablet Take 1 tablet (40 mg total) by mouth daily. 12/18/15   Camelia Eng Tysinger, PA-C  lisinopril (PRINIVIL,ZESTRIL) 20 MG tablet Take 1 tablet (20 mg total) by mouth daily. 12/18/15   Camelia Eng Tysinger, PA-C  NITROSTAT 0.4 MG SL tablet Place 1 tablet (0.4 mg total) under the tongue every 5 (five) minutes as needed for chest pain. Please call office. 03/10/16   Jerline Pain, MD  pantoprazole (PROTONIX) 40 MG tablet Take 1 tablet (40 mg total) by mouth daily. 12/18/15   Camelia Eng Tysinger, PA-C  polyethylene glycol [facility]) packet Take 17 g by mouth daily as needed. 12/18/15   Camelia Eng Tysinger, PA-C  potassium chloride SA (K-DUR,KLOR-CON) 20 MEQ tablet Take 1 tablet (20 mEq total) by mouth daily. 12/18/15   Camelia Eng Tysinger, PA-C  pravastatin (PRAVACHOL) 40 MG tablet Take 1 tablet (40 mg total) by mouth daily at 6 PM. 12/18/15   Carlena Hurl, PA-C  sildenafil (VIAGRA) 100 MG tablet Take 1 tablet (100 mg total) by mouth as needed for erectile dysfunction. 05/20/16   Jerline Pain, MD    ALLERGIES:  No Known Allergies  SOCIAL HISTORY:  Social History  Substance Use Topics  . Smoking status: Current Some Day Smoker    Packs/day: 0.50    Years: 23.00  . Smokeless tobacco: Never Used  . Alcohol use No    FAMILY HISTORY: Family History  Problem Relation Age of Onset  . Hypertension Mother     deceased.  . Diabetes Mother   . Heart disease Mother   . Alzheimer's disease Father   . Gallbladder disease Sister   . Other Brother     drug abuse, cocaine  . Cancer Neg Hx   . Stroke Neg Hx     EXAM: BP (!) 176/113   Pulse 63   Temp 98.4 F (36.9 C) (Oral)   Resp 14   SpO2 99%  CONSTITUTIONAL: Alert and oriented and responds appropriately to questions. Well-appearing; well-nourished, Appears uncomfortable HEAD: Normocephalic EYES: Conjunctivae clear,  PERRL, EOMI ENT: normal nose; no rhinorrhea; moist mucous membranes NECK: Supple, no meningismus, no nuchal rigidity, no LAD  CARD: RRR; S1 and S2 appreciated; no murmurs, no clicks, no rubs, no gallops RESP: Normal chest excursion without splinting or tachypnea; breath sounds clear and equal bilaterally; no wheezes, no rhonchi, no rales, no hypoxia or respiratory distress, speaking full sentences ABD/GI: Normal bowel sounds; non-distended; soft, non-tender, no rebound, no guarding, no peritoneal signs, no hepatosplenomegaly RECTAL:  Normal rectal tone, no gross blood or melena, no hemorrhoids appreciated, nontender rectal exam, no fecal impaction; no stool in the rectal vault BACK:  The  back appears normal and is non-tender to palpation, there is no CVA tenderness EXT: Normal ROM in all joints; non-tender to palpation; no edema; normal capillary refill; no cyanosis, no calf tenderness or swelling    SKIN: Normal color for age and race; warm; no rash NEURO: Moves all extremities equally, sensation to light touch intact diffusely, cranial nerves II through XII intact, normal speech PSYCH: The patient's mood and manner are appropriate. Grooming and personal hygiene are appropriate.  MEDICAL DECISION MAKING: Patient here with abdominal pain from constipation. He feels like his constipation is making him have pressure in his lower chest but denies that this feels similar to his anginal:. His EKG shows significant T-wave inversions in lateral leads but appears to be unchanged compared to prior EKG in 2013. First troponin here negative.  He has no fecal impaction on exam and discuss with him that I do not feel enemas, suppositories will be helpful given there is no stool in the rectal vault but have recommended MiraLAX, Colace daily. We'll give him simethicone for symptomatically relief. Low suspicion for obstruction given his abdominal exam but will obtain x-ray. He is mildly hypertensive but this may be  from discomfort. Have advised against narcotics and he agrees. Does have mildly elevated lipase is just outside the level of normal but no significant tenderness in the epigastric region, no history of alcohol abuse, gallstones or pancreatitis in the past.   Patient has been very argumentative throughout his entire ED visit refusing tests, examination within normal grooming. Have recommended admission given he is having chest pain and has significant cardiac history but he refuses.  ED PROGRESS: Patient's second troponin is negative. BNP is elevated to 1400 but patient does not appear volume overloaded.  X-ray shows cardiomegaly without failure. There is a nonobstructed gas pattern with mild to moderate stool in the colon seen on x-ray. Have offered admission for chest pain rule out but patient states that this does not feel like his anginal equivalent he thinks it is from his constipation and refuses admission after we have discussed that several times. States he just followed up with his cardiologist several weeks ago.   He is still hypertensive here despite stating he feels better. Now reports he has not had his blood pressure medications in several days because he has not been feeling well. He is on Coreg, lisinopril, amlodipine and Lasix. We'll give him his home medications here as well as a dose of IV hydralazine and continue to monitor.   4:40 AM  Patient agreed finally to IV hydralazine after arguing with nursing staff about receiving IV medications. Has received his home medications but his blood pressure continues to climb. He now feels short of breath and has some crackles on exam. Suspect that he is having CHF exacerbation from uncontrolled hypertension. Will start IV labetalol drip, give nitroglycerin paste, aspirin, IV Lasix and obtain repeat chest x-ray. He finally agrees to admission to the hospital.  He denies any chest or abdominal pain at this time.  5:05 AM  D/w Dr. Alcario Drought with  hospitalist service. Patient is now appearing anxious and asking for something to help with anxiety and requesting to be on BiPAP. We'll place patient on BiPAP and obtain ABG.  5:17 AM  Pt cannot go to stepdown on IV labetalol drip. We'll switch to nitroglycerin drip which may be more appropriate given suggestions of volume overload. I do not feel he needs ICU but at this time. I feel he is stable for  stepdown.  Patient to be admitted by hospitalist service. They will place admission orders. Patient and wife updated with plan.   I reviewed all nursing notes, vitals, pertinent old records, EKGs, labs, imaging (as available).    EKG Interpretation  Date/Time:  Friday June 04 2016 22:55:52 EST Ventricular Rate:  86 PR Interval:  188 QRS Duration: 132 QT Interval:  434 QTC Calculation: 519 R Axis:   88 Text Interpretation:  Sinus rhythm with occasional Premature ventricular complexes Biatrial enlargement Non-specific intra-ventricular conduction block T wave abnormality, consider inferolateral ischemia Abnormal ECG No significant change since last tracing in 2013 Confirmed by Harsha Yusko,  DO, Susanne Baumgarner [phone]) on 06/05/2016 12:48:36 AM         EKG Interpretation  Date/Time:  Saturday June 05 2016 03:42:22 EST Ventricular Rate:  81 PR Interval:  188 QRS Duration: 124 QT Interval:  462 QTC Calculation: 537 R Axis:   103 Text Interpretation:  Sinus rhythm Biatrial enlargement Consider left ventricular hypertrophy Abnormal T, consider ischemia, diffuse leads Prolonged QT interval No significant change since last tracing Confirmed by Taima Rada,  DO, Loney Peto [id number]) on 06/05/2016 4:31:05 AM       CRITICAL CARE Performed by: Nyra Jabs   Total critical care time: 65 minutes  Critical care time was exclusive of separately billable procedures and treating other patients.  Critical care was necessary to treat or prevent imminent or life-threatening deterioration.  Critical care was  time spent personally by me on the following activities: development of treatment plan with patient and/or surrogate as well as nursing, discussions with consultants, evaluation of patient's response to treatment, examination of patient, obtaining history from patient or surrogate, ordering and performing treatments and interventions, ordering and review of laboratory studies, ordering and review of radiographic studies, pulse oximetry and re-evaluation of patient's condition.   I personally performed the services described in this documentation, which was scribed in my presence. The recorded information has been reviewed and is accurate.    [place], DO 06/05/16 0518   5:37 AM  Pt's ABG shows a pH of 6.99, PCO2 of 72.1, bicarbonate 17.4. This appears to be a mixed metabolic and respiratory acidosis. Patient is still sitting upright, breathing on his own and talking. I do not feel at this time he needs to be intubated. I will discuss with critical care to evaluate the patient in the emergency department.  5:49 AM  D/w Dr. Jimmy Footman with critical care. They will see the patient in consult. Patient has been evaluated by hospitalist service. Hospitalist is holding on admission orders until critical care has seen patient.  Hospitalist will sign outpatient to the oncoming morning shift to re-eval patient and review CCM recommendations.   Roann, DO 06/05/16 0552   6:20 AM  Pt appears clinically to be improving. His blood pressure is improving as is his respiratory rate. He is doing well on BiPAP. He was given a small dose of IV Ativan to help with his anxiety. Repeat chest x-ray appears to show bilateral edema worse in the lower lobes.  I have updated Dr. Alcario Drought with hospitalist service. I feel patient is safe to be admitted to hospitalist team to stepdown bed but he would like to see critical care's recommendations first before placing admission orders.   Clay, DO 06/05/16  0622   7:05 AM  Pt's blood pressure is 133/90. He is still wakes up and can talk full sentences. Doing well on BiPAP. He has been seen  back or to call care who feels he is stable for stepdown. I will update hospitalist service so they can place admission orders.    May, DO 06/05/16 [phone]

## 2016-06-05 NOTE — H&P (Signed)
History and Physical    Roger Martinez [id number] DOB: [birth year] DOA: 06/05/2016  PCP: Crisoforo Oxford, PA-C  Patient coming from: home  Chief Complaint: chest pain, abdominal pain, ocnstipation  HPI: Roger Martinez is a 51 y.o. male with medical history significant for CAD with history of NSTEMI, CABG 3 in 2011, ischemic cardiomyopathy with EF 25-30%, HTN, HLD, GERD, substance abuse (marijuana and tobacco) who presented to the ED with initial complaints of epigastric pain as well as mild chest pressure and headache which improved with nitroglycerin.  Patient has been functional at home an only missed 3 or 4 days of BP meds since December.  He has been experiencing intermittent chest pain during the last couple of days and it was mainly localized in the lower mid chest with no radiation and gradually was getrting worse with sensation of discomfort and fullness in the abdomen during the last few days. He thought this was constipation, but got no relief after 2 enemas or induced vomiting. He denied lower extremities swelling and has not been having any orthopnea or PND He denies recent sick contacts, fever, chills, cough, sputum, hemoptysis, nausea, diarrhea, urinary complaints.    ED Course: On presentation his to the ED his blood pressure was poorly controlled with initial reading of 184/101 mmHg and gradually escalated to 230/183 mmHg. He was tachypneic and intermittently tachycardic and was resistant to initial workup but a few hours after his arrival became acutely decompensated with labored breathing and dropping of O2 sats to 78% on room air. Patient was placed on BiPAP and ABG while on BiPAP demonstrated pH 6.99, PCO2 71.7, PO2 249 and HCO3 20. Blood work revealed elevated BNP to 1422, troponin was 0.07, creatinine 1.3 with baseline near 1.0 CXR was consistent with pulmonary edema and dilated cardiomyopathy.  Patient was placed on nitro drip was substantially improved his blood  pressure-last reading was 125/82 mmHg. BiPAP significantly improved his respiratory status, now his SPO2 is 99% EKG showed ST with prolonged QT interval and TW inversions in leads II-III, aVF, V5-6 present before, but more pronounced now   Review of Systems: As per HPI otherwise 10 point review of systems negative.   Ambulatory Status: Independent  Past Medical History:  Diagnosis Date  . Anxiety state, unspecified   . Chronic combined systolic (congestive) and diastolic (congestive) heart failure    a. 03/2014 Echo: EF 20-25%, diff HK, Gr 2 DD, mild AI, sev dil LA, mod reduced RV fxn, PASP 65mmHg.  Marland Kitchen Coronary artery disease    a. 2011 s/p CABG x 3.  . GERD (gastroesophageal reflux disease)    takes protonix q3 days as of 12/2015  . Hyperlipidemia   . Hypertension   . Hypertensive heart disease with heart failure ([place])   . Ischemic cardiomyopathy    a. 03/2014 Echo: EF 20-25%;  b. 06/2014 s/p BSX single lead AICD, ser # [id number].  . Marijuana abuse   . MI (myocardial infarction) 03/2011  . Noncompliance   . Perirectal abscess    once prior as of 12/2015  . Sciatica   . STD (sexually transmitted disease)   . Tobacco abuse   . Wears glasses     Past Surgical History:  Procedure Laterality Date  . BYPASS GRAFT  2011   3 vessel  . EP IMPLANTABLE DEVICE  06/17/2014   DR [name]  . FOOT SURGERY     trauma repair, 51yo, lawnmower incident  . IMPLANTABLE CARDIOVERTER DEFIBRILLATOR IMPLANT N/A 06/17/2014   Procedure:  IMPLANTABLE CARDIOVERTER DEFIBRILLATOR IMPLANT;  Surgeon: Deboraha Sprang, MD;  Location: [facility] CATH LAB;  Service: Cardiovascular;  Laterality: N/A;  . INCISION AND DRAINAGE PERIRECTAL ABSCESS N/A 06/13/2015   Procedure: IRRIGATION AND DEBRIDEMENT PERIRECTAL ABSCESS;  Surgeon: Michael Boston, MD;  Location: [facility] ORS;  Service: General;  Laterality: N/A;    Social History   Social History  . Marital status: Married    Spouse name: N/A  . Number of children: N/A  . Years of  education: N/A   Occupational History  . Not on file.   Social History Main Topics  . Smoking status: Current Some Day Smoker    Packs/day: 0.50    Years: 23.00  . Smokeless tobacco: Never Used  . Alcohol use No  . Drug use: Yes    Types: Marijuana     Comment: none for 2 weeks  . Sexual activity: Not on file   Other Topics Concern  . Not on file   Social History Narrative   Lives in [place] with wife. Walking some for exercise.  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  Has 3 children in [place], [place].   Christian.  12/2015    No Known Allergies  Family History  Problem Relation Age of Onset  . Hypertension Mother     deceased.  . Diabetes Mother   . Heart disease Mother   . Alzheimer's disease Father   . Gallbladder disease Sister   . Other Brother     drug abuse, cocaine  . Cancer Neg Hx   . Stroke Neg Hx     Prior to Admission medications   Medication Sig Start Date End Date Taking? Authorizing Provider  aspirin EC 81 MG tablet Take 1 tablet (81 mg total) by mouth daily. 12/18/15  Yes Camelia Eng Tysinger, PA-C  carvedilol (COREG) 25 MG tablet Take 1 tablet (25 mg total) by mouth 2 (two) times daily with a meal. 12/18/15  Yes Camelia Eng Tysinger, PA-C  dicyclomine (BENTYL) 20 MG tablet Take 1 tablet (20 mg total) by mouth 2 (two) times daily. 12/12/15  Yes Olivia Canter Sam, PA-C  furosemide (LASIX) 40 MG tablet Take 1 tablet (40 mg total) by mouth daily. 12/18/15  Yes Camelia Eng Tysinger, PA-C  lisinopril (PRINIVIL,ZESTRIL) 20 MG tablet Take 1 tablet (20 mg total) by mouth daily. 12/18/15  Yes David S Tysinger, PA-C  NITROSTAT 0.4 MG SL tablet Place 1 tablet (0.4 mg total) under the tongue every 5 (five) minutes as needed for chest pain. Please call office. 03/10/16  Yes Jerline Pain, MD  pantoprazole (PROTONIX) 40 MG tablet Take 1 tablet (40 mg total) by mouth daily. 12/18/15  Yes Camelia Eng Tysinger, PA-C  polyethylene  glycol [facility]) packet Take 17 g by mouth daily as needed. 12/18/15  Yes Camelia Eng Tysinger, PA-C  potassium chloride SA (K-DUR,KLOR-CON) 20 MEQ tablet Take 1 tablet (20 mEq total) by mouth daily. 12/18/15  Yes Camelia Eng Tysinger, PA-C  pravastatin (PRAVACHOL) 40 MG tablet Take 1 tablet (40 mg total) by mouth daily at 6 PM. 12/18/15  Yes Camelia Eng Tysinger, PA-C  sildenafil (VIAGRA) 100 MG tablet Take 1 tablet (100 mg total) by mouth as needed for erectile dysfunction. 05/20/16  Yes Jerline Pain, MD  amLODipine (NORVASC) 5 MG tablet Take 1 tablet (5 mg total) by mouth daily. 12/18/15   Carlena Hurl, PA-C    Physical Exam: Vitals:  06/05/16 0600 06/05/16 0615 06/05/16 0645 06/05/16 0726  BP: (!) 183/120 (!) 161/120 133/90 125/82  Pulse: 84 79 61 (!) 54  Resp: (!) 29 (!) 29 23 (!) 27  Temp:      TempSrc:      SpO2: 100% 99% 98% 99%     General: Appears calm and comfortable Eyes: PERRLA, EOMI, normal lids, iris ENT:  grossly normal hearing, lips & tongue, mucous membranes moist and intact Neck: no lymphoadenopathy, masses or thyromegaly Cardiovascular: RRR, no m/r/g. No JVD, carotid bruits. No LE edema.  Respiratory: bilateral coarse breath sound with rales and rhonchi. Normal respiratory effort. No accessory muscle use observed Abdomen: soft, non-tender, non-distended, no organomegaly or masses appreciated. BS present in all quadrants Skin: no rash, ulcers or induration seen on limited exam Musculoskeletal: grossly normal tone BUE/BLE, good ROM, no bony abnormality or joint deformities observed Psychiatric: grossly normal mood and affect, speech fluent and appropriate, alert and oriented x3 Neurologic: CN II-XII grossly intact, moves all extremities in coordinated fashion, sensation intact  Labs on Admission: I have personally reviewed following labs and imaging studies  CBC, BMP  GFR: CrCl cannot be calculated (Unknown ideal weight.).   Creatinine Clearance: CrCl cannot be calculated  (Unknown ideal weight.).    Radiological Exams on Admission: Dg Chest Portable 1 View  Result Date: 06/05/2016 CLINICAL DATA:  Shortness of breath.  Acute distress. EXAM: PORTABLE CHEST 1 VIEW COMPARISON:  Acute abdominal series the same date at 2:11 a.m. FINDINGS: The heart is enlarged. Interstitial edema has increased since the earlier film. Retrocardiac opacification is increased. The visualized soft tissues and bony thorax are unremarkable. IMPRESSION: 1. Cardiomegaly with increasing interstitial edema compatible with congestive heart failure. 2. Progressive retrocardiac opacification likely reflects atelectasis. Electronically Signed   By: San Morelle M.D.   On: 06/05/2016 06:32   Dg Abdomen Acute W/chest  Result Date: 06/05/2016 CLINICAL DATA:  Four days of constipation, chest pressure EXAM: DG ABDOMEN ACUTE W/ 1V CHEST COMPARISON:  12/12/2015 FINDINGS: Single-view chest demonstrates a left-sided pacing device, similar compared to prior. Post sternotomy changes. Mild to moderate cardiomegaly without pulmonary edema. No acute infiltrate. No effusion. Supine and upright views of the abdomen demonstrate no free air beneath the diaphragm. Nonobstructed bowel gas pattern with mild to moderate stool in the right colon. Vascular calcifications in the upper quadrants and pelvis. IMPRESSION: 1. Cardiomegaly without overt failure 2. Nonobstructed gas pattern with mild to moderate stool in the colon Electronically Signed   By: Donavan Foil M.D.   On: 06/05/2016 02:32    EKG: Independently reviewed - ST with prolonged QT interval and TW inversions in leads II-III, aVF, V5-6 present before, but more pronounced now   Assessment/Plan Principal Problem:   HTN (hypertension), malignant Active Problems:   CAD, multiple vessel s/p CABG 2011    Acute on chronic combined systolic and diastolic congestive heart failure, NYHA class 3 (HCC)   Acute respiratory failure (HCC)   Acute kidney injury  (HCC)   Constipation    Acute respiratory failure associated with CHF exacerbation and flush pulmonary edema Patient is still on BiPAP, but his work of breathing is substantially improved Appreciate CCM input and will follow their recommendations Continue diuretic therapy and monitor urinary output   Malignant HTN with flush pulmonary edema Patient was placed initially on NTG drip and transitioned to Labetalol Currently BP and HR are well controlled, he received Lasix 40 mg IV and 40 mg of oral dose Reassess tomorrow and  adjust the dose of diuretic as needecd Continue to monitor and transition to oral antihypertensives; home meds and doses might need to be adjusted   Chest pain in patient with CAD and h/o CABG  Currently chest pain free Troponin is 0.07, EKG showed QT prolongation, ST wave abnormality was seen on the previous tracing, now is more pronounced and probably associated with strain d/t elevated BP and pulmonary edema Continue to monitor on telemetry, cycle troponin  Acute kidney injury Continue ACEI and monitor renal function Control BP and avoid nephrotoxic drugs  Constipation Initiate bowel regimen that will be continued at discharge   DVT prophylaxis: hrparin Code Status: full Family Communication: at bedside Disposition Plan: stepdown Consults called: CCM by EDP Admission status: pbservation   York Grice, PA-C Pager: [phone] [facility]  If 7PM-7AM, please contact night-coverage [email or web address] Password [email or web address]  06/05/2016, 8:20 AM

## 2016-06-05 NOTE — Consult Note (Signed)
Name: Roger Martinez MRN: [id number] DOB: 1965-10-15    ADMISSION DATE:  06/05/2016 CONSULTATION DATE:  06/05/16  REFERRING MD :  EDP  CHIEF COMPLAINT:  Shortness of breath  BRIEF PATIENT DESCRIPTION: 26M w/ hypertensive emergency, flash pulmonary edema, acute hypercapneic respiratory failure requiring BPAP. PMH significant for ischemic cardiomyopathy, hypertension, HFrEF (25-30%), HLD and GERD.  SIGNIFICANT EVENTS   STUDIES:   HISTORY OF PRESENT ILLNESS:   Roger Martinez is a 89M with PMH significant for ischemic cardiomyopathy with EF 25-30%, HTN, HLD, GERD, who presented to the ED with initial complaints of constipation and epigastric pain as well as mild chest pressure and headache which improved with nitroglycerin. While in the ED he remained hypertensive and was resistant to much of the initial workup. He then experienced an acute decompensation where he became acutely short of breath, markedly hypertensive and requested BiPAP. ABG on BPAP notable for pH 6.97, pCO2 71.7, pO2 249, HCO3 20. BNP elevated at 1422. Cr 1.3 (baseline ~ 1.0). Troponin negative. CBC WNL. CXR consistent with pulmonary edema and dilated cardiomyopathy.  On BPAP, he is more comfortable. BP is improved on nitro gtt. He is to be admitted to stepdown. We are consulted for his hypercapneic respiratory failure and BPAP requirements.  He and his wife provide the history. He is feeling a bit better since going on BPAP. He has been functional at home an only missed 3 or 4 days of BP meds since December. No recent issues with edema, although the sensation of abdominal fullness has been really bothering him for 3 days. He thought this was constipation, but got no relief with 2 enemas or induced vomiting. He denies recent sick contacts, fever, chills, cough, sputum, hemoptysis, nausea, diarrhea, urinary complaints.   PAST MEDICAL HISTORY :   has a past medical history of Anxiety state, unspecified; Chronic combined systolic  (congestive) and diastolic (congestive) heart failure; Coronary artery disease; GERD (gastroesophageal reflux disease); Hyperlipidemia; Hypertension; Hypertensive heart disease with heart failure ([place]); Ischemic cardiomyopathy; Marijuana abuse; MI (myocardial infarction) (03/2011); Noncompliance; Perirectal abscess; Sciatica; STD (sexually transmitted disease); Tobacco abuse; and Wears glasses.  has a past surgical history that includes Bypass Graft (2011); Cardiac catheterization (06/17/2014); implantable cardioverter defibrillator implant (N/A, 06/17/2014); Incision and drainage perirectal abscess (N/A, 06/13/2015); and Foot surgery. Prior to Admission medications   Medication Sig Start Date End Date Taking? Authorizing Provider  aspirin EC 81 MG tablet Take 1 tablet (81 mg total) by mouth daily. 12/18/15  Yes Camelia Eng Tysinger, PA-C  carvedilol (COREG) 25 MG tablet Take 1 tablet (25 mg total) by mouth 2 (two) times daily with a meal. 12/18/15  Yes Camelia Eng Tysinger, PA-C  dicyclomine (BENTYL) 20 MG tablet Take 1 tablet (20 mg total) by mouth 2 (two) times daily. 12/12/15  Yes Olivia Canter Sam, PA-C  furosemide (LASIX) 40 MG tablet Take 1 tablet (40 mg total) by mouth daily. 12/18/15  Yes Camelia Eng Tysinger, PA-C  lisinopril (PRINIVIL,ZESTRIL) 20 MG tablet Take 1 tablet (20 mg total) by mouth daily. 12/18/15  Yes David S Tysinger, PA-C  NITROSTAT 0.4 MG SL tablet Place 1 tablet (0.4 mg total) under the tongue every 5 (five) minutes as needed for chest pain. Please call office. 03/10/16  Yes Jerline Pain, MD  pantoprazole (PROTONIX) 40 MG tablet Take 1 tablet (40 mg total) by mouth daily. 12/18/15  Yes Camelia Eng Tysinger, PA-C  polyethylene glycol [facility]) packet Take 17 g by mouth daily as needed. 12/18/15  Yes Shanon Brow  S Tysinger, PA-C  potassium chloride SA (K-DUR,KLOR-CON) 20 MEQ tablet Take 1 tablet (20 mEq total) by mouth daily. 12/18/15  Yes Camelia Eng Tysinger, PA-C  pravastatin (PRAVACHOL) 40 MG tablet Take 1 tablet (40 mg  total) by mouth daily at 6 PM. 12/18/15  Yes Camelia Eng Tysinger, PA-C  sildenafil (VIAGRA) 100 MG tablet Take 1 tablet (100 mg total) by mouth as needed for erectile dysfunction. 05/20/16  Yes Jerline Pain, MD  amLODipine (NORVASC) 5 MG tablet Take 1 tablet (5 mg total) by mouth daily. 12/18/15   Camelia Eng Tysinger, PA-C   No Known Allergies  FAMILY HISTORY:  family history includes Alzheimer's disease in his father; Diabetes in his mother; Gallbladder disease in his sister; Heart disease in his mother; Hypertension in his mother; Other in his brother. SOCIAL HISTORY:  reports that he has been smoking.  He has a 11.50 pack-year smoking history. He has never used smokeless tobacco. He reports that he uses drugs, including Marijuana. He reports that he does not drink alcohol.  REVIEW OF SYSTEMS:   General: no weight change, no fever, no chills Cardiovascular: no chest pain, palpitations, orthopnea, or PND until in the ED this evening Respiratory: see HPI Gastrointestinal: see HPI Genitourinary: no pain with urination, no foul odor, no frequency, no urgency Musculoskeletal: no joint pain or swelling, no muscle aches Skin: no rashes, sores, or ulcers Endocrine: no blood sugar problems, no thyroid problems Neurologic: no numbness, tingling, dizziness, or visual changes Hematologic: no bleeding, bruising, or clotting problems Psychiatric: no depression or anxiety, no suicidal ideation or intent  SUBJECTIVE:   VITAL SIGNS: Temp:  [98.4 F (36.9 C)] 98.4 F (36.9 C) (02/23 2259) Pulse Rate:  [55-92] 92 (02/24 0545) Resp:  [13-36] 34 (02/24 0545) BP: (155-230)/(101-183) 207/141 (02/24 0545) SpO2:  [92 %-100 %] 100 % (02/24 0545)  PHYSICAL EXAMINATION:  General Well nourished, well developed, uncomfortable, but not really distressed  HEENT No gross abnormalities. Oropharynx clear. BPAP in place  Pulmonary Bibasilar rales, upper fields clear. No wheezes or ronchi. Good effort, symmetrical  expansion, on BPAP  Cardiovascular Normal rate, regular rhythm. S1, s2. No m/r/g. Distal pulses palpable.  Abdomen Soft, non-tender, non-distended, positive bowel sounds, no palpable organomegaly or masses. Normoresonant to percussion.  Musculoskeletal Grossly normal  Lymphatics No cervical, supraclavicular or axillary adenopathy.   Neurologic Grossly intact. No focal deficits.   Skin/Integuement No rash, no cyanosis, no clubbing.       Recent Labs Lab 06/04/16 2311  NA 139  K 3.8  CL 107  CO2 24  BUN 14  CREATININE 1.30*  GLUCOSE 133*    Recent Labs Lab 06/04/16 2311  HGB 15.2  HCT 44.6  WBC 9.8  PLT 249   Dg Abdomen Acute W/chest  Result Date: 06/05/2016 CLINICAL DATA:  Four days of constipation, chest pressure EXAM: DG ABDOMEN ACUTE W/ 1V CHEST COMPARISON:  12/12/2015 FINDINGS: Single-view chest demonstrates a left-sided pacing device, similar compared to prior. Post sternotomy changes. Mild to moderate cardiomegaly without pulmonary edema. No acute infiltrate. No effusion. Supine and upright views of the abdomen demonstrate no free air beneath the diaphragm. Nonobstructed bowel gas pattern with mild to moderate stool in the right colon. Vascular calcifications in the upper quadrants and pelvis. IMPRESSION: 1. Cardiomegaly without overt failure 2. Nonobstructed gas pattern with mild to moderate stool in the colon Electronically Signed   By: Donavan Foil M.D.   On: 06/05/2016 02:32    ASSESSMENT / PLAN:  Mr. Karow is a 65M with PMH of ischemic cardiomyopathy (EF 25-30%), hypertension, dyslipidemia, GERD, tobacco abuse, who presents with 3 days of abdominal fullness / constipation. While being evaluated he seems to have gone into flash pulmonary edema with hypertensive emergency. BP is responding to nitro gtt and respiratory status is significantly improved on BPAP. Initial gas was consistent with an acute respiratory acidosis. He is tolerating BPAP well. He is tired, but his  mental status is appropriate with no signs of CO2 narcosis. He appears stable for the stepdown unit at this time.  1) Hypertensive emergency with flash pulmonary edema 2) Acute hypoxemic/hypercapneic respiratory failure requiring BPAP 3) Acute on chronic systolic heart failure 4) Constipation   Continue BPAP  Check ABG after ~2h of therapy to ensure appropriate response  Diurese  Control BP  Monitor renal function  Aggressive bowel regimen  Thank you for the consult. We will continue to follow.  Yisroel Ramming, MD Pulmonary and [place] Pager: [phone]  06/05/2016, 6:44 AM

## 2016-06-05 NOTE — Plan of Care (Signed)
Problem: Bowel/Gastric: Goal: Will not experience complications related to bowel motility Outcome: Not Progressing Patient without bowel movement in 4 days.  Tolerating laxatives ordered.

## 2016-06-05 NOTE — ED Notes (Signed)
Attempted Report x1.   

## 2016-06-05 NOTE — ED Notes (Signed)
CRITICAL VALUE ALERT  Critical value received:  Lactic Acid 2.7, Troponin 0.07   Date of notification:  06/05/2016  Time of notification:  0716  Critical value read back: Yes  Nurse who received alert:  Delon Sacramento RN  MD notified (1st page):  Ward  Time of first page:  0716  MD notified (2nd page):  Time of second page:  Responding MD:  Ward   Time MD responded:  [phone]

## 2016-06-05 NOTE — Progress Notes (Signed)
Patient's critical ABG results given to Dr. Leonides Schanz and Dr. Alcario Drought. No new orders received at this time. Patient is tolerating BiPAP moderately well at this time. RT will continue to monitor.

## 2016-06-05 NOTE — ED Notes (Signed)
Patient transported to X-ray 

## 2016-06-05 NOTE — ED Notes (Signed)
Pt had sudden onset on SOB, diaphoretic and very anxious, pt yelling stating that he needs the bipap

## 2016-06-06 ENCOUNTER — Observation Stay (HOSPITAL_BASED_OUTPATIENT_CLINIC_OR_DEPARTMENT_OTHER): Payer: PRIVATE HEALTH INSURANCE

## 2016-06-06 DIAGNOSIS — I509 Heart failure, unspecified: Secondary | ICD-10-CM

## 2016-06-06 DIAGNOSIS — I10 Essential (primary) hypertension: Secondary | ICD-10-CM | POA: Diagnosis not present

## 2016-06-06 DIAGNOSIS — J9601 Acute respiratory failure with hypoxia: Secondary | ICD-10-CM

## 2016-06-06 DIAGNOSIS — I5043 Acute on chronic combined systolic (congestive) and diastolic (congestive) heart failure: Secondary | ICD-10-CM | POA: Diagnosis not present

## 2016-06-06 DIAGNOSIS — J9602 Acute respiratory failure with hypercapnia: Secondary | ICD-10-CM | POA: Diagnosis not present

## 2016-06-06 LAB — TROPONIN I
TROPONIN I: 0.14 ng/mL — AB (ref <0.03)
Troponin I: 0.16 ng/mL (ref <0.03)
Troponin I: 0.13 ng/mL (ref <0.03)

## 2016-06-06 LAB — COMPREHENSIVE METABOLIC PANEL
ALBUMIN: 3.6 g/dL (ref 3.5–5.0)
ALT: 11 U/L — AB (ref 17–63)
AST: 24 U/L (ref 15–41)
Alkaline Phosphatase: 78 U/L (ref 38–126)
Anion gap: 11 (ref 5–15)
BUN: 13 mg/dL (ref 6–20)
CHLORIDE: 105 mmol/L (ref 101–111)
CO2: 25 mmol/L (ref 22–32)
CREATININE: 1.17 mg/dL (ref 0.61–1.24)
Calcium: 9.4 mg/dL (ref 8.9–10.3)
GFR calc Af Amer: >60 mL/min (ref >60)
GFR calc non Af Amer: >60 mL/min (ref >60)
GLUCOSE: 97 mg/dL (ref 65–99)
Potassium: 3.4 mmol/L — ABNORMAL LOW (ref 3.5–5.1)
Sodium: 141 mmol/L (ref 135–145)
Total Bilirubin: 1.3 mg/dL — ABNORMAL HIGH (ref 0.3–1.2)
Total Protein: 6.9 g/dL (ref 6.5–8.1)

## 2016-06-06 LAB — CBC
HEMATOCRIT: 45.2 % (ref 39.0–52.0)
Hemoglobin: 15.5 g/dL (ref 13.0–17.0)
MCH: 31.6 pg (ref 26.0–34.0)
MCHC: 34.3 g/dL (ref 30.0–36.0)
MCV: 92.2 fL (ref 78.0–100.0)
Platelets: 244 10*3/uL (ref 150–400)
RBC: 4.9 MIL/uL (ref 4.22–5.81)
RDW: 13.4 % (ref 11.5–15.5)
WBC: 10 10*3/uL (ref 4.0–10.5)

## 2016-06-06 MED ORDER — FUROSEMIDE 10 MG/ML IJ SOLN
40.0000 mg | Freq: Two times a day (BID) | INTRAMUSCULAR | Status: DC
  Administered 2016-06-06 (×2): 40 mg via INTRAVENOUS
  Filled 2016-06-06 (×3): qty 4

## 2016-06-06 MED ORDER — ASPIRIN EC 325 MG PO TBEC
325.0000 mg | DELAYED_RELEASE_TABLET | Freq: Every day | ORAL | Status: DC
  Administered 2016-06-06 – 2016-06-07 (×2): 325 mg via ORAL
  Filled 2016-06-06 (×2): qty 1

## 2016-06-06 MED ORDER — ENOXAPARIN SODIUM 40 MG/0.4ML ~~LOC~~ SOLN
40.0000 mg | Freq: Every day | SUBCUTANEOUS | Status: DC
  Administered 2016-06-06: 40 mg via SUBCUTANEOUS
  Filled 2016-06-06: qty 0.4

## 2016-06-06 MED ORDER — BISACODYL 10 MG RE SUPP
10.0000 mg | Freq: Every day | RECTAL | Status: DC | PRN
  Administered 2016-06-06: 10 mg via RECTAL
  Filled 2016-06-06: qty 1

## 2016-06-06 MED ORDER — DOCUSATE SODIUM 100 MG PO CAPS: 100.0000 mg | ORAL_CAPSULE | Freq: Two times a day (BID) | ORAL | Status: DC | PRN

## 2016-06-06 MED ORDER — ISOSORBIDE MONONITRATE ER 30 MG PO TB24
30.0000 mg | ORAL_TABLET | Freq: Every day | ORAL | Status: DC
  Administered 2016-06-06 – 2016-06-07 (×2): 30 mg via ORAL
  Filled 2016-06-06 (×2): qty 1

## 2016-06-06 MED ORDER — HYDRALAZINE HCL 20 MG/ML IJ SOLN
10.0000 mg | Freq: Once | INTRAMUSCULAR | Status: AC
  Administered 2016-06-06: 10 mg via INTRAVENOUS
  Filled 2016-06-06: qty 1

## 2016-06-06 MED ORDER — AMLODIPINE BESYLATE 10 MG PO TABS
10.0000 mg | ORAL_TABLET | Freq: Every day | ORAL | Status: DC
  Administered 2016-06-06 – 2016-06-07 (×2): 10 mg via ORAL
  Filled 2016-06-06 (×2): qty 1

## 2016-06-06 MED ORDER — MAGNESIUM HYDROXIDE 400 MG/5ML PO SUSP
30.0000 mL | Freq: Once | ORAL | Status: AC
  Administered 2016-06-06: 30 mL via ORAL
  Filled 2016-06-06: qty 30

## 2016-06-06 NOTE — Plan of Care (Signed)
Problem: Physical Regulation: Goal: Ability to maintain clinical measurements within normal limits will improve Outcome: Completed/Met Date Met: 06/06/16 All vital signs WNL. Patient BP has returned to normal off IV nitro. Patient's respiratory status has improved and he is able to maintain adequate oxygen saturation levels on room air. Goal: Will remain free from infection Outcome: Completed/Met Date Met: 06/06/16 No S/S infection.  Problem: Education: Goal: Ability to verbalize understanding of medication therapies will improve Outcome: Progressing Patient understands the purpose of his IV lasix in treating his heart failure symptoms.

## 2016-06-06 NOTE — Progress Notes (Signed)
Patient called me in the room wanting the NTG drip to be turned off stated " its giving me heartburn" patient reassured and educated on NTG drug indications but insisted that I turn the drip off. Patient much more calm after I turned NTG off. Will monitor BP every 74mins and inform MD.

## 2016-06-06 NOTE — Progress Notes (Signed)
[facility] Progress Note  Patient: Roger Martinez [id number]   PCP: Crisoforo Oxford, PA-C DOB: [birth year]   DOA: 06/05/2016   DOS: 06/06/2016   Date of Service: the patient was seen and examined on 06/06/2016   Subjective: Feeling better, no chest pain or shortness of breath. No nausea no vomiting. 3 months ago in [place] he had an echocardiogram and apparently his EF was 20%.  Brief hospital course: Pt. with PMH of HTN, HLD, CAD s/p CABG, NSTEMI, systolic CHF last EF [id number] in 04/2015, and substance abuse; admitted on 06/05/2016, with complaint of constipation returns started having shortness of breath, was found to have flash pulmonary edema with acute on chronic combined CHF as well as acute respiratory failure. Currently further plan is continue diuresis.  Assessment and Plan: 1. 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 Past pulmonary edema. Acute respiratory failure with hypoxia. Initially presented with constipation later on developed hypoxia requiring BiPAP. CCM was consulted. Responded well to IV diuresis. Currently on room air. Trace swelling in the leg. Blood pressure improved with diuresis and currently nitroglycerin glycerin infusion is stopped. I'll start the patient on Imdur, increase amlodipine from 5 mg a time milligram, continue Coreg. Denies any drug abuse. Mild elevation of serum troponin not consistent with ACS likely demand ischemia in the setting of flash pulmonary edema. We will check echocardiogram.  2. Constipation. He received magnesium citrate without any improvement. Give milk of magnesia, Colace, MiraLAX.  Monitor.  3. Acute kidney injury. Renal function was mildly worsened on presentation. Getting better at present. We'll monitor.  Bowel regimen: last BM 06/02/2016 Diet: cardiac diet DVT Prophylaxis: subcutaneous Heparin  Advance goals of care discussion: full code  Family Communication:  family was present at bedside, at the time of interview. The pt provided permission to discuss medical plan with the family. Opportunity was given to ask question and all questions were answered satisfactorily.   Disposition:  Discharge to home. Expected discharge date: 06/07/2016,   Consultants: CCM Procedures: echocardiogram  Antibiotics: Anti-infectives    None        Objective: Physical Exam: Vitals:   06/06/16 1112 06/06/16 1142 06/06/16 1313 06/06/16 1342  BP: (!) 173/93 (!) 156/92 (!) 148/90 139/80  Pulse: 69 (!) 53    Resp: 20 13 (!) 21 (!) 23  Temp:      TempSrc:      SpO2: 93% 96%    Weight:      Height:        Intake/Output Summary (Last 24 hours) at 06/06/16 1428 Last data filed at 06/06/16 1000  Gross per 24 hour  Intake           328.96 ml  Output             1900 ml  Net         -1571.04 ml   Filed Weights   06/05/16 1532 06/06/16 0400  Weight: 84.7 kg (186 lb 11.2 oz) 83.4 kg (183 lb 14.4 oz)    General: Alert, Awake and Oriented to Time, Place and Person. Appear in mild distress, affect appropriate Eyes: PERRL, Conjunctiva normal ENT: Oral Mucosa clear moist Neck: no JVD, no Abnormal Mass Or lumps Cardiovascular: S1 and S2 Present, no Murmur, Respiratory: Bilateral Air entry equal and Decreased, basal Crackles, no wheezes Abdomen: Bowel Sound present, Soft and no tenderness Skin: no redness, no Rash, no induration Extremities: trace Pedal edema, no calf tenderness Neurologic: Grossly no focal neuro  deficit. Bilaterally Equal motor strength  Data Reviewed: CBC:  Recent Labs Lab 06/04/16 2311 06/05/16 1208 06/06/16 0519  WBC 9.8 5.6 10.0  HGB 15.2 17.0 15.5  HCT 44.6 49.5 45.2  MCV 92.9 93.6 92.2  PLT 249 251 [id number]   Basic Metabolic Panel:  Recent Labs Lab 06/04/16 2311 06/05/16 1208 06/06/16 0519  NA 139  --  141  K 3.8  --  3.4*  CL 107  --  105  CO2 24  --  25  GLUCOSE 133*  --  97  BUN 14  --  13  CREATININE 1.30* 1.40*  1.17  CALCIUM 9.5  --  9.4    Liver Function Tests:  Recent Labs Lab 06/06/16 0519  AST 24  ALT 11*  ALKPHOS 78  BILITOT 1.3*  PROT 6.9  ALBUMIN 3.6    Recent Labs Lab 06/04/16 2311  LIPASE 55*   No results for input(s): AMMONIA in the last 168 hours. Coagulation Profile: No results for input(s): INR, PROTIME in the last 168 hours. Cardiac Enzymes:  Recent Labs Lab 06/05/16 0606 06/05/16 1233 06/05/16 2144 06/06/16 1055  TROPONINI 0.07* 0.12* 0.22* 0.16*   BNP (last 3 results) No results for input(s): PROBNP in the last 8760 hours.  CBG: No results for input(s): GLUCAP in the last 168 hours.  Studies: No results found.   Scheduled Meds: . amLODipine  10 mg Oral Daily  . aspirin EC  325 mg Oral Daily  . carvedilol  25 mg Oral BID WC  . dicyclomine  20 mg Oral BID  . furosemide  40 mg Intravenous Q12H  . isosorbide mononitrate  30 mg Oral Daily  . lisinopril  20 mg Oral Daily  . pantoprazole  40 mg Oral Daily  . polyethylene glycol  17 g Oral Daily  . potassium chloride SA  20 mEq Oral Daily  . pravastatin  40 mg Oral q1800   Continuous Infusions: PRN Meds: acetaminophen, bisacodyl, docusate sodium, gi cocktail, ondansetron (ZOFRAN) IV  Time spent: 30 minutes  Author: Berle Mull, MD [facility] Pager: [phone] 06/06/2016 2:28 PM  If 7PM-7AM, please contact night-coverage at [email or web address], password [facility]

## 2016-06-06 NOTE — Consult Note (Signed)
Name: Roger Martinez MRN: [id number] DOB: 21-Nov-1965    ADMISSION DATE:  06/05/2016 CONSULTATION DATE:  06/05/16  REFERRING MD :  EDP  CHIEF COMPLAINT:  Shortness of breath  BRIEF PATIENT DESCRIPTION: 66M w/ hypertensive emergency, flash pulmonary edema, acute hypercapneic respiratory failure requiring BPAP. PMH significant for ischemic cardiomyopathy, hypertension, HFrEF (25-30%), HLD and GERD.  SIGNIFICANT EVENTS   STUDIES:   HISTORY OF PRESENT ILLNESS:   Roger Martinez is a 89M with PMH significant for ischemic cardiomyopathy with EF 25-30%, HTN, HLD, GERD, who presented to the ED with initial complaints of constipation and epigastric pain as well as mild chest pressure and headache which improved with nitroglycerin. While in the ED he remained hypertensive and was resistant to much of the initial workup. He then experienced an acute decompensation where he became acutely short of breath, markedly hypertensive and requested BiPAP. ABG on BPAP notable for pH 6.97, pCO2 71.7, pO2 249, HCO3 20. BNP elevated at 1422. Cr 1.3 (baseline ~ 1.0). Troponin negative. CBC WNL. CXR consistent with pulmonary edema and dilated cardiomyopathy.  On BPAP, he is more comfortable. BP is improved on nitro gtt. He is to be admitted to stepdown. We are consulted for his hypercapneic respiratory failure and BPAP requirements.  He and his wife provide the history. He is feeling a bit better since going on BPAP. He has been functional at home an only missed 3 or 4 days of BP meds since December. No recent issues with edema, although the sensation of abdominal fullness has been really bothering him for 3 days. He thought this was constipation, but got no relief with 2 enemas or induced vomiting. He denies recent sick contacts, fever, chills, cough, sputum, hemoptysis, nausea, diarrhea, urinary complaints.       Psychiatric: no depression or anxiety, no suicidal ideation or intent  SUBJECTIVE:  Comfortable lying flat in  bed nad RA   VITAL SIGNS: Temp:  [97.6 F (36.4 C)-99.2 F (37.3 C)] 97.6 F (36.4 C) (02/25 0848) Pulse Rate:  [53-86] 53 (02/25 1142) Resp:  [13-24] 23 (02/25 1342) BP: (139-189)/(78-112) 139/80 (02/25 1342) SpO2:  [86 %-99 %] 96 % (02/25 1142) Weight:  [183 lb 14.4 oz (83.4 kg)-186 lb 11.2 oz (84.7 kg)] 183 lb 14.4 oz (83.4 kg) (02/25 0400)  PHYSICAL EXAMINATION:  Wt Readings from Last 3 Encounters:  06/06/16 183 lb 14.4 oz (83.4 kg)  05/20/16 194 lb (88 kg)  02/09/16 195 lb (88.5 kg)    Vital signs reviewed  Pt alert, approp nad  No jvd Oropharynx clear Neck supple Lungs with a few scattered crackles in bases bilaterally  RRR no s3 or or sign murmur Abd obese with excursion >> Extr wam with no edema or clubbing noted Neuro  No motor deficits       Recent Labs Lab 06/04/16 2311 06/05/16 1208 06/06/16 0519  NA 139  --  141  K 3.8  --  3.4*  CL 107  --  105  CO2 24  --  25  BUN 14  --  13  CREATININE 1.30* 1.40* 1.17  GLUCOSE 133*  --  97    Recent Labs Lab 06/04/16 2311 06/05/16 1208 06/06/16 0519  HGB 15.2 17.0 15.5  HCT 44.6 49.5 45.2  WBC 9.8 5.6 10.0  PLT 249 251 244   Dg Chest Portable 1 View  Result Date: 06/05/2016 CLINICAL DATA:  Shortness of breath.  Acute distress. EXAM: PORTABLE CHEST 1 VIEW COMPARISON:  Acute abdominal series  the same date at 2:11 a.m. FINDINGS: The heart is enlarged. Interstitial edema has increased since the earlier film. Retrocardiac opacification is increased. The visualized soft tissues and bony thorax are unremarkable. IMPRESSION: 1. Cardiomegaly with increasing interstitial edema compatible with congestive heart failure. 2. Progressive retrocardiac opacification likely reflects atelectasis. Electronically Signed   By: San Morelle M.D.   On: 06/05/2016 06:32   Dg Abdomen Acute W/chest  Result Date: 06/05/2016 CLINICAL DATA:  Four days of constipation, chest pressure EXAM: DG ABDOMEN ACUTE W/ 1V CHEST  COMPARISON:  12/12/2015 FINDINGS: Single-view chest demonstrates a left-sided pacing device, similar compared to prior. Post sternotomy changes. Mild to moderate cardiomegaly without pulmonary edema. No acute infiltrate. No effusion. Supine and upright views of the abdomen demonstrate no free air beneath the diaphragm. Nonobstructed bowel gas pattern with mild to moderate stool in the right colon. Vascular calcifications in the upper quadrants and pelvis. IMPRESSION: 1. Cardiomegaly without overt failure 2. Nonobstructed gas pattern with mild to moderate stool in the colon Electronically Signed   By: Donavan Foil M.D.   On: 06/05/2016 02:32    ASSESSMENT / PLAN:     1) Hypertensive emergency with flash pulmonary edema> resolving nicely  2) Acute hypoxemic/hypercapneic respiratory failure requiring BPAP> no longer needed 3) Acute on chronic systolic heart failure 4) Constipation  Key here is tight bp management   No further pulmonary f/u needed   Christinia Gully, MD [facility] [place] [phone] After 5:30 PM or weekends, use [facility] [phone]

## 2016-06-07 DIAGNOSIS — I10 Essential (primary) hypertension: Secondary | ICD-10-CM | POA: Diagnosis not present

## 2016-06-07 LAB — CBC
HEMATOCRIT: 45.7 % (ref 39.0–52.0)
HEMOGLOBIN: 15.6 g/dL (ref 13.0–17.0)
MCH: 31.6 pg (ref 26.0–34.0)
MCHC: 34.1 g/dL (ref 30.0–36.0)
MCV: 92.5 fL (ref 78.0–100.0)
Platelets: 244 10*3/uL (ref 150–400)
RBC: 4.94 MIL/uL (ref 4.22–5.81)
RDW: 13.5 % (ref 11.5–15.5)
WBC: 8.8 10*3/uL (ref 4.0–10.5)

## 2016-06-07 LAB — HEMOGLOBIN A1C
HEMOGLOBIN A1C: 5.6 % (ref 4.8–5.6)
Mean Plasma Glucose: 114 mg/dL

## 2016-06-07 LAB — MAGNESIUM: Magnesium: 2.2 mg/dL (ref 1.7–2.4)

## 2016-06-07 LAB — ECHOCARDIOGRAM COMPLETE
Height: 71 in
Weight: 2942.4 oz

## 2016-06-07 LAB — BASIC METABOLIC PANEL
Anion gap: 9 (ref 5–15)
BUN: 14 mg/dL (ref 6–20)
CHLORIDE: 103 mmol/L (ref 101–111)
CO2: 27 mmol/L (ref 22–32)
CREATININE: 1.29 mg/dL — AB (ref 0.61–1.24)
Calcium: 9.6 mg/dL (ref 8.9–10.3)
GFR calc Af Amer: >60 mL/min (ref >60)
GFR calc non Af Amer: >60 mL/min (ref >60)
Glucose, Bld: 99 mg/dL (ref 65–99)
Potassium: 3.3 mmol/L — ABNORMAL LOW (ref 3.5–5.1)
Sodium: 139 mmol/L (ref 135–145)

## 2016-06-07 MED ORDER — ISOSORBIDE MONONITRATE ER 30 MG PO TB24: 30.0000 mg | ORAL_TABLET | Freq: Every day | ORAL | 0 refills | Status: DC

## 2016-06-07 MED ORDER — AMLODIPINE BESYLATE 10 MG PO TABS: 10.0000 mg | ORAL_TABLET | Freq: Every day | ORAL | 0 refills | Status: DC

## 2016-06-07 MED ORDER — FUROSEMIDE 40 MG PO TABS
40.0000 mg | ORAL_TABLET | Freq: Two times a day (BID) | ORAL | Status: DC
  Administered 2016-06-07: 40 mg via ORAL
  Filled 2016-06-07: qty 1

## 2016-06-07 MED ORDER — DOCUSATE SODIUM 100 MG PO CAPS: 100.0000 mg | ORAL_CAPSULE | Freq: Two times a day (BID) | ORAL | 0 refills | Status: DC | PRN

## 2016-06-07 NOTE — Discharge Instructions (Signed)
Low-Sodium Eating Plan Sodium raises blood pressure and causes water to be held in the body. Getting less sodium from food will help lower your blood pressure, reduce any swelling, and protect your heart, liver, and kidneys. We get sodium by adding salt (sodium chloride) to food. Most of our sodium comes from canned, boxed, and frozen foods. Restaurant foods, fast foods, and pizza are also very high in sodium. Even if you take medicine to lower your blood pressure or to reduce fluid in your body, getting less sodium from your food is important. What is my plan? Most people should limit their sodium intake to 2,300 mg a day. Your health care provider recommends that you limit your sodium intake to __________ a day. What do I need to know about this eating plan? For the low-sodium eating plan, you will follow these general guidelines:  Choose foods with a % Daily Value for sodium of less than 5% (as listed on the food label).  Use salt-free seasonings or herbs instead of table salt or sea salt.  Check with your health care provider or pharmacist before using salt substitutes.  Eat fresh foods.  Eat more vegetables and fruits.  Limit canned vegetables. If you do use them, rinse them well to decrease the sodium.  Limit cheese to 1 oz (28 g) per day.  Eat lower-sodium products, often labeled as "lower sodium" or "no salt added."  Avoid foods that contain monosodium glutamate (MSG). MSG is sometimes added to [place] food and some canned foods.  Check food labels (Nutrition Facts labels) on foods to learn how much sodium is in one serving.  Eat more home-cooked food and less restaurant, buffet, and fast food.  When eating at a restaurant, ask that your food be prepared with less salt, or no salt if possible. How do I read food labels for sodium information? The Nutrition Facts label lists the amount of sodium in one serving of the food. If you eat more than one serving, you must multiply the  listed amount of sodium by the number of servings. Food labels may also identify foods as:  Sodium free--Less than 5 mg in a serving.  Very low sodium--35 mg or less in a serving.  Low sodium--140 mg or less in a serving.  Light in sodium--50% less sodium in a serving. For example, if a food that usually has 300 mg of sodium is changed to become light in sodium, it will have 150 mg of sodium.  Reduced sodium--25% less sodium in a serving. For example, if a food that usually has 400 mg of sodium is changed to reduced sodium, it will have 300 mg of sodium. What foods can I eat? Grains  Low-sodium cereals, including oats, puffed wheat and rice, and shredded wheat cereals. Low-sodium crackers. Unsalted rice and pasta. Lower-sodium bread. Vegetables  Frozen or fresh vegetables. Low-sodium or reduced-sodium canned vegetables. Low-sodium or reduced-sodium tomato sauce and paste. Low-sodium or reduced-sodium tomato and vegetable juices. Fruits  Fresh, frozen, and canned fruit. Fruit juice. Meat and Other Protein Products  Low-sodium canned tuna and salmon. Fresh or frozen meat, poultry, seafood, and fish. Lamb. Unsalted nuts. Dried beans, peas, and lentils without added salt. Unsalted canned beans. Homemade soups without salt. Eggs. Dairy  Milk. Soy milk. Ricotta cheese. Low-sodium or reduced-sodium cheeses. Yogurt. Condiments  Fresh and dried herbs and spices. Salt-free seasonings. Onion and garlic powders. Low-sodium varieties of mustard and ketchup. Fresh or refrigerated horseradish. Lemon juice. Fats and Oils  Reduced-sodium  salad dressings. Unsalted butter. Other  Unsalted popcorn and pretzels. The items listed above may not be a complete list of recommended foods or beverages. Contact your dietitian for more options.  What foods are not recommended? Grains  Instant hot cereals. Bread stuffing, pancake, and biscuit mixes. Croutons. Seasoned rice or pasta mixes. Noodle soup cups. Boxed or  frozen macaroni and cheese. Self-rising flour. Regular salted crackers. Vegetables  Regular canned vegetables. Regular canned tomato sauce and paste. Regular tomato and vegetable juices. Frozen vegetables in sauces. Salted [place] fries. Olives. Angie Fava. Relishes. Sauerkraut. Salsa. Meat and Other Protein Products  Salted, canned, smoked, spiced, or pickled meats, seafood, or fish. Bacon, ham, sausage, hot dogs, corned beef, chipped beef, and packaged luncheon meats. Salt pork. Jerky. Pickled herring. Anchovies, regular canned tuna, and sardines. Salted nuts. Dairy  Processed cheese and cheese spreads. Cheese curds. Blue cheese and cottage cheese. Buttermilk. Condiments  Onion and garlic salt, seasoned salt, table salt, and sea salt. Canned and packaged gravies. Worcestershire sauce. Tartar sauce. Barbecue sauce. Teriyaki sauce. Soy sauce, including reduced sodium. Steak sauce. Fish sauce. Oyster sauce. Cocktail sauce. Horseradish that you find on the shelf. Regular ketchup and mustard. Meat flavorings and tenderizers. Bouillon cubes. Hot sauce. Tabasco sauce. Marinades. Taco seasonings. Relishes. Fats and Oils  Regular salad dressings. Salted butter. Margarine. Ghee. Bacon fat. Other  Potato and tortilla chips. Corn chips and puffs. Salted popcorn and pretzels. Canned or dried soups. Pizza. Frozen entrees and pot pies. The items listed above may not be a complete list of foods and beverages to avoid. Contact your dietitian for more information.  This information is not intended to replace advice given to you by your health care provider. Make sure you discuss any questions you have with your health care provider. Document Released: 09/18/2001 Document Revised: 09/04/2015 Document Reviewed: 01/31/2013 Elsevier Interactive Patient Education  2017 Else[employer] Inc.    Pulmonary Edema Pulmonary edema is abnormal fluid buildup in the lungs that can make it hard to breathe. Follow these instructions at  home:  Talk to your doctor about an exercise program.  Eat a healthy diet:  Eat fresh fruits, vegetables, and lean meats.  Limit high fat and salty foods.  Avoid processed, canned, or fried foods.  Avoid fast food.  Follow your doctor's advice about taking medicine and recording the medicine you take.  Follow your doctor's advice about keeping a record of your weight.  Talk to your doctor about keeping track of your blood pressure.  Do not smoke.  Do not use nicotine patches or nicotine gum.  Make a follow-up appointment with your doctor.  Ask your doctor for a copy of your latest heart tracing (ECG) and keep a copy with you at all times. Get help right away if:  You have chest pain. THIS IS AN EMERGENCY. Do not wait to see if the pain will go away. Call for local emergency medical help. Do not drive yourself to the hospital.  You have sweating, feel sick to your stomach (nauseous), or are experiencing shortness of breath.  Your weight increases more than your doctor tells you it should.  You start to have shortness of breath.  You notice more swelling in your hands, feet, ankles, or belly.  You have dizziness, blurred vision, headache, or unsteadiness that does not go away.  You cough up bloody spit.  You have a cough that does not go away.  You are unable to sleep because it is hard to breathe.  You begin to feel a jumping or fluttering sensation (palpitations) in the chest that is unusual for you. This information is not intended to replace advice given to you by your health care provider. Make sure you discuss any questions you have with your health care provider. Document Released: 03/17/2009 Document Revised: 09/04/2015 Document Reviewed: 12/04/2012 Elsevier Interactive Patient Education  2017 [place].   Hypertension Hypertension is another name for high blood pressure. High blood pressure forces your heart to work harder to pump blood. A blood  pressure reading has two numbers, which includes a higher number over a lower number (example: 110/72). Follow these instructions at home:  Have your blood pressure rechecked by your doctor.  Only take medicine as told by your doctor. Follow the directions carefully. The medicine does not work as well if you skip doses. Skipping doses also puts you at risk for problems.  Do not smoke.  Monitor your blood pressure at home as told by your doctor. Contact a doctor if:  You think you are having a reaction to the medicine you are taking.  You have repeat headaches or feel dizzy.  You have puffiness (swelling) in your ankles.  You have trouble with your vision. Get help right away if:  You get a very bad headache and are confused.  You feel weak, numb, or faint.  You get chest or belly (abdominal) pain.  You throw up (vomit).  You cannot breathe very well. This information is not intended to replace advice given to you by your health care provider. Make sure you discuss any questions you have with your health care provider. Document Released: 09/15/2007 Document Revised: 09/04/2015 Document Reviewed: 01/19/2013 Elsevier Interactive Patient Education  2017 [employer].

## 2016-06-07 NOTE — Progress Notes (Signed)
Patient discharge order received.  Patient discharge instructions reviewed with and given to patient.  Dr Posey Pronto paged to re send prescriptions to the [facility] on [address] in [place].  Dr Posey Pronto called back to confirm this has been completed.  Patient taken to main entrance via wheelchair with volunteer to wife's vehicle.

## 2016-06-09 NOTE — Discharge Summary (Signed)
[facility] Discharge Summary   Patient: Roger Martinez [id number]   PCP: Crisoforo Oxford, PA-C DOB: 1966/01/31   Date of admission: 06/05/2016   Date of discharge: 06/07/2016    Discharge Diagnoses:  Principal Problem:   HTN (hypertension), malignant Active Problems:   CAD, multiple vessel s/p CABG 2011    Acute on chronic combined systolic and diastolic congestive heart failure, NYHA class 3 (HCC)   Acute respiratory failure ([place])   Acute kidney injury ([place])   Constipation   Admitted From: home Disposition:  home  Recommendations for Outpatient Follow-up:  1. Follow-up with PCP in 2 weeks, cardiology in 2 weeks   Follow-up Information    TYSINGER, DAVID [place], PA-C. Schedule an appointment as soon as possible for a visit in 1 week(s).   Specialty:  Family Medicine Contact information: [address] [place] [address] [phone]        Richardson Dopp, PA-C. Schedule an appointment as soon as possible for a visit in 2 week(s).   Specialties:  Cardiology, Physician Assistant Contact information: [address] [address] [phone]          Diet recommendation: Cardiac diet  Activity: The patient is advised to gradually reintroduce usual activities.  Discharge Condition: good  Code Status: Full code  History of present illness: As per the H and P dictated on admission, "Roger Martinez is a 51 y.o. male with medical history significant for CAD with history of NSTEMI, CABG 3 in 2011, ischemic cardiomyopathy with EF 25-30%, HTN, HLD, GERD, substance abuse (marijuana and tobacco) who presented to the ED with initial complaints of epigastric pain as well as mild chest pressure and headache which improved with nitroglycerin.  Patient has been functional at home an only missed 3 or 4 days of BP meds since December.  He has been experiencing intermittent chest pain during the last couple of days and it was mainly  localized in the lower mid chest with no radiation and gradually was getrting worse with sensation of discomfort and fullness in the abdomen during the last few days. He thought this was constipation, but got no relief after 2 enemas or induced vomiting. He denied lower extremities swelling and has not been having any orthopnea or PND He denies recent sick contacts, fever, chills, cough, sputum, hemoptysis, nausea, diarrhea, urinary complaints. "  Hospital Course:   Summary of his active problems in the hospital is as following. 1. 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 Flash pulmonary edema. Acute respiratory failure with hypoxia. Initially presented with constipation later on developed hypoxia requiring BiPAP. CCM was consulted. Responded well to IV diuresis. Currently on room air. Trace swelling in the leg. Blood pressure improved with diuresis and nitroglycerin infusion. Patient was started on Imdur, later on received a call from the pharmacy due to interaction with sildenafil and it was discontinued. increase amlodipine from 5 mg to 10 mg, continue Coreg. Denies any drug abuse. Mild elevation of serum troponin not consistent with ACS likely demand ischemia in the setting of flash pulmonary edema. echocardiogram shows improved EF. MRI was recommended. His course with Cardiology on phone, recommended outpatient follow-up..  2. Constipation. He received magnesium citrate without any improvement. Give milk of magnesia, Colace, MiraLAX. Resolved.  discontinue Bentyl  3. Acute kidney injury. Renal function was mildly worsened on presentation. Getting better at present. Outpatient follow-up  All other chronic medical condition were stable during the hospitalization.  Patient was  ambulatory without any assistance. On the day of the discharge the patient's vitals were stable, and no other acute medical condition were reported by patient. the  patient was felt safe to be discharge at home with family.  Procedures and Results:  Echocardiogram   Study Conclusions  - Left ventricle: Diffuse hypokinesis with inferior akinesis at mid   and basal level. The cavity size was moderately dilated. Wall   thickness was increased in a pattern of severe LVH. Systolic   function was moderately to severely reduced. The estimated   ejection fraction was in the range of 30% to 35%. Doppler   parameters are consistent with both elevated ventricular   end-diastolic filling pressure and elevated left atrial filling   pressure. - Aortic valve: There was mild regurgitation. Valve area (VTI):   2.93 cm^2. Valve area (Vmax): 2.66 cm^2. Valve area (Vmean): 2.67   cm^2. - Mitral valve: There was mild regurgitation. - Left atrium: The atrium was severely dilated. - Pulmonary arteries: PA peak pressure: 35 mm Hg (S). - Impressions: Severe LVH consider MRI to r/o amyloid or non   obstructive HOCM if clinically indicated.  Impressions:  - Severe LVH consider MRI to r/o amyloid or non obstructive HOCM if   clinically indicated.  Consultations:  none  DISCHARGE MEDICATION: Discharge Medication List as of 06/07/2016 11:02 AM    START taking these medications   Details  docusate sodium (COLACE) 100 MG capsule Take 1 capsule (100 mg total) by mouth 2 (two) times daily as needed for mild constipation., Starting Mon 06/07/2016, Normal    isosorbide mononitrate (IMDUR) 30 MG 24 hr tablet Take 1 tablet (30 mg total) by mouth daily., Starting Mon 06/07/2016, Normal      CONTINUE these medications which have CHANGED   Details  amLODipine (NORVASC) 10 MG tablet Take 1 tablet (10 mg total) by mouth daily., Starting Mon 06/07/2016, Normal      CONTINUE these medications which have NOT CHANGED   Details  aspirin EC 81 MG tablet Take 1 tablet (81 mg total) by mouth daily., Starting Thu 12/18/2015, Normal    carvedilol (COREG) 25 MG tablet Take 1 tablet  (25 mg total) by mouth 2 (two) times daily with a meal., Starting Thu 12/18/2015, Normal    furosemide (LASIX) 40 MG tablet Take 1 tablet (40 mg total) by mouth daily., Starting Thu 12/18/2015, Normal    lisinopril (PRINIVIL,ZESTRIL) 20 MG tablet Take 1 tablet (20 mg total) by mouth daily., Starting Thu 12/18/2015, Normal    NITROSTAT 0.4 MG SL tablet Place 1 tablet (0.4 mg total) under the tongue every 5 (five) minutes as needed for chest pain. Please call office., Starting Wed 03/10/2016, Normal    pantoprazole (PROTONIX) 40 MG tablet Take 1 tablet (40 mg total) by mouth daily., Starting Thu 12/18/2015, Normal    polyethylene glycol (MIRALAX) packet Take 17 g by mouth daily as needed., Starting Thu 12/18/2015, Normal    potassium chloride SA (K-DUR,KLOR-CON) 20 MEQ tablet Take 1 tablet (20 mEq total) by mouth daily., Starting Thu 12/18/2015, Normal    pravastatin (PRAVACHOL) 40 MG tablet Take 1 tablet (40 mg total) by mouth daily at 6 PM., Starting Thu 12/18/2015, Normal    sildenafil (VIAGRA) 100 MG tablet Take 1 tablet (100 mg total) by mouth as needed for erectile dysfunction., Starting Thu 05/20/2016, Normal      STOP taking these medications     dicyclomine (BENTYL) 20 MG tablet  No Known Allergies Discharge Instructions    Diet - low sodium heart healthy    Complete by:  As directed    Discharge instructions    Complete by:  As directed    It is important that you read following instructions as well as go over your medication list with RN to help you understand your care after this hospitalization.  Discharge Instructions: Please follow-up with PCP in one week  Please request your primary care physician to go over all Hospital Tests and Procedure/Radiological results at the follow up,  Please get all Hospital records sent to your PCP by signing hospital release before you go home.   Do not take more than prescribed Pain, Sleep and Anxiety Medications. You were cared for by a  hospitalist during your hospital stay. If you have any questions about your discharge medications or the care you received while you were in the hospital after you are discharged, you can call the unit and ask to speak with the hospitalist on call if the hospitalist that took care of you is not available.  Once you are discharged, your primary care physician will handle any further medical issues. Please note that NO REFILLS for any discharge medications will be authorized once you are discharged, as it is imperative that you return to your primary care physician (or establish a relationship with a primary care physician if you do not have one) for your aftercare needs so that they can reassess your need for medications and monitor your lab values. You Must read complete instructions/literature along with all the possible adverse reactions/side effects for all the Medicines you take and that have been prescribed to you. Take any new Medicines after you have completely understood and accept all the possible adverse reactions/side effects. Wear Seat belts while driving. If you have smoked or chewed Tobacco in the last 2 yrs please stop smoking and/or stop any Recreational drug use.   Increase activity slowly    Complete by:  As directed      Discharge Exam: Filed Weights   06/05/16 1532 06/06/16 0400 06/07/16 0500  Weight: 84.7 kg (186 lb 11.2 oz) 83.4 kg (183 lb 14.4 oz) 82.1 kg (180 lb 14.4 oz)   Vitals:   06/07/16 0815 06/07/16 1050  BP: (!) 152/88 (!) 160/95  Pulse:    Resp: 16   Temp:     General: Appear in no distress, no Rash; Oral Mucosa moist. Cardiovascular: S1 and S2 Present, no Murmur, no JVD Respiratory: Bilateral Air entry present and Clear to Auscultation, no Crackles, no wheezes Abdomen: Bowel Sound present, Soft and no tenderness Extremities: no Pedal edema, no calf tenderness Neurology: Grossly no focal neuro deficit.  The results of significant diagnostics from this  hospitalization (including imaging, microbiology, ancillary and laboratory) are listed below for reference.    Significant Diagnostic Studies: Dg Chest Portable 1 View  Result Date: 06/05/2016 CLINICAL DATA:  Shortness of breath.  Acute distress. EXAM: PORTABLE CHEST 1 VIEW COMPARISON:  Acute abdominal series the same date at 2:11 a.m. FINDINGS: The heart is enlarged. Interstitial edema has increased since the earlier film. Retrocardiac opacification is increased. The visualized soft tissues and bony thorax are unremarkable. IMPRESSION: 1. Cardiomegaly with increasing interstitial edema compatible with congestive heart failure. 2. Progressive retrocardiac opacification likely reflects atelectasis. Electronically Signed   By: San Morelle M.D.   On: 06/05/2016 06:32   Dg Abdomen Acute W/chest  Result Date: 06/05/2016 CLINICAL DATA:  Four days of  constipation, chest pressure EXAM: DG ABDOMEN ACUTE W/ 1V CHEST COMPARISON:  12/12/2015 FINDINGS: Single-view chest demonstrates a left-sided pacing device, similar compared to prior. Post sternotomy changes. Mild to moderate cardiomegaly without pulmonary edema. No acute infiltrate. No effusion. Supine and upright views of the abdomen demonstrate no free air beneath the diaphragm. Nonobstructed bowel gas pattern with mild to moderate stool in the right colon. Vascular calcifications in the upper quadrants and pelvis. IMPRESSION: 1. Cardiomegaly without overt failure 2. Nonobstructed gas pattern with mild to moderate stool in the colon Electronically Signed   By: Donavan Foil M.D.   On: 06/05/2016 02:32    Microbiology: No results found for this or any previous visit (from the past 240 hour(s)).   Labs: CBC:  Recent Labs Lab 06/04/16 2311 06/05/16 1208 06/06/16 0519 06/07/16 0343  WBC 9.8 5.6 10.0 8.8  HGB 15.2 17.0 15.5 15.6  HCT 44.6 49.5 45.2 45.7  MCV 92.9 93.6 92.2 92.5  PLT 249 251 244 [id number]   Basic Metabolic Panel:  Recent  Labs Lab 06/04/16 2311 06/05/16 1208 06/06/16 0519 06/07/16 0343  NA 139  --  141 139  K 3.8  --  3.4* 3.3*  CL 107  --  105 103  CO2 24  --  25 27  GLUCOSE 133*  --  97 99  BUN 14  --  13 14  CREATININE 1.30* 1.40* 1.17 1.29*  CALCIUM 9.5  --  9.4 9.6  MG  --   --   --  2.2   Liver Function Tests:  Recent Labs Lab 06/06/16 0519  AST 24  ALT 11*  ALKPHOS 78  BILITOT 1.3*  PROT 6.9  ALBUMIN 3.6    Recent Labs Lab 06/04/16 2311  LIPASE 55*   No results for input(s): AMMONIA in the last 168 hours. Cardiac Enzymes:  Recent Labs Lab 06/05/16 1233 06/05/16 2144 06/06/16 1055 06/06/16 1530 06/06/16 2204  TROPONINI 0.12* 0.22* 0.16* 0.13* 0.14*   BNP (last 3 results)  Recent Labs  09/07/15 1117 11/21/15 0449 06/05/16 0111  BNP 2,206.0* 1,919.9* 1,422.2*   CBG: No results for input(s): GLUCAP in the last 168 hours. Time spent: 30 minutes  Signed:  Rosabella Edgin  [facility] 06/07/2016 , 5:01 PM

## 2016-06-11 ENCOUNTER — Other Ambulatory Visit: Payer: Self-pay | Admitting: Cardiology

## 2016-06-17 ENCOUNTER — Ambulatory Visit (INDEPENDENT_AMBULATORY_CARE_PROVIDER_SITE_OTHER): Payer: PRIVATE HEALTH INSURANCE | Admitting: Medical

## 2016-06-17 ENCOUNTER — Encounter: Payer: Self-pay | Admitting: Medical

## 2016-06-17 VITALS — BP 150/90 | HR 71 | Wt 194.4 lb

## 2016-06-17 DIAGNOSIS — K921 Melena: Secondary | ICD-10-CM

## 2016-06-17 DIAGNOSIS — I255 Ischemic cardiomyopathy: Secondary | ICD-10-CM

## 2016-06-17 DIAGNOSIS — Z720 Tobacco use: Secondary | ICD-10-CM

## 2016-06-17 DIAGNOSIS — I5043 Acute on chronic combined systolic (congestive) and diastolic (congestive) heart failure: Secondary | ICD-10-CM | POA: Diagnosis not present

## 2016-06-17 DIAGNOSIS — I5041 Acute combined systolic (congestive) and diastolic (congestive) heart failure: Secondary | ICD-10-CM | POA: Diagnosis not present

## 2016-06-17 DIAGNOSIS — I110 Hypertensive heart disease with heart failure: Secondary | ICD-10-CM | POA: Diagnosis not present

## 2016-06-17 DIAGNOSIS — I2510 Atherosclerotic heart disease of native coronary artery without angina pectoris: Secondary | ICD-10-CM | POA: Diagnosis not present

## 2016-06-17 DIAGNOSIS — K5900 Constipation, unspecified: Secondary | ICD-10-CM | POA: Diagnosis not present

## 2016-06-17 DIAGNOSIS — I739 Peripheral vascular disease, unspecified: Secondary | ICD-10-CM

## 2016-06-17 NOTE — Patient Instructions (Addendum)
Recommendations:  Follow up with cardiology next week  Ask cardiology about once daily Coreg CR or other needed med changes  Check you weight daily. Your baseline is 194lb.   If you see >3 lb increase in a day, or > 5lb increase over a few days, call us or cardiology immediately  Ask cardiology about walking program or cardiac rehab  Limit salt, cut out soda, be careful to monitor your salt intake.    Take miralax daily to help with bowels, have a consistent diet  We will rescheduled the gastroenterology visit   STOP smoking!  continue to take your medications EVERY DAY

## 2016-06-17 NOTE — Progress Notes (Signed)
Subjective: Chief Complaint  Patient presents with  . Constipation    follow up from hospital    Here for hospital f/u.  accompanied by his wife.  Was admitted 06/05/16  Discharge Diagnoses:  Principal Problem:   HTN (hypertension), malignant Active Problems:   CAD, multiple vessel s/p CABG 2011    Acute on chronic combined systolic and diastolic congestive heart failure, NYHA class 3 (HCC)   Acute respiratory failure (HCC)   Acute kidney injury ([place])   Constipation  Roger Martinez has a hx/o 3 vessel bypass 2011, CAD, HTN, ICD, CHF, marijuana use, hyperlipidemia, hx/o noncompliance, tobacco use.   He recently went to ED with c/o epigastric pain, chest pressure, headache, intermittent chest pain, fullness in abdomen.  He thought he was constipated, did 2 enemas and induced vomiting, but no relief.  Went to ED.  While initially presenting with these symptoms, developed hypoxia, was put on BiPAP, was diuresed and responded well to this.   Had NTG infusion.  He was started Imdur, but this was subsequently discontinued given potential for interaction with Sildenafil.    Amlodipine was increased from 5mg  to 10mg , Coreg continued.   Had troponin elevation thought to be demand ischemia in setting of flash pulmonary edema.   Regarding constipation, Mg Citrate didn't help, was given milk of magnesia, colace, miralax and did get relief.  He had acute kidney injury upon prevention but this improved.    Updated echo showed diffuse hypokinesis with inferior akinesis at mid and basal level.  Severe LVH.  EF 30-35%.  Radiology notation was to consider MRI to rule out Amyloid or non obstructive HOCM.       Currently having 1-2 BM per day.  Soft and formed stool.  Not taking Miralax currently.  No additional blood in stool since last visit.  No family hx/o GI cancers.  Currently feels like the medication regimen is working well.  Doesn't have chest pressure currently, hasn't felt this good in a while.    Use to wake up with heart burn.    He had a scare in [place] in 03/2016, he had been noncompliant, ended up in the hospital in [place].   He still notes that he may miss 1 day out of every 12 days of medication but is much better with compliance.  Still smoking, 1/2 ppd.   Drinks once monthly.  Checking weights every 3 days.   Denies SOB, no leg swelling.  Wife notes he is drinking some soda.    Sees heart doctor next week. He feels his baseline weight is 195 lb.   Has 3 children, 21yo, 23yo, 18yo.    Can't walk over 2 miles at a time, gets leg pain.   Has to stop for 5 minutes after walking.  Saw vein specialist late 2017, advised f/u in a year.  They did discuss possible surgery in the future.   Past Medical History:  Diagnosis Date  . Anxiety state, unspecified   . Chronic combined systolic (congestive) and diastolic (congestive) heart failure    a. 03/2014 Echo: EF 20-25%, diff HK, Gr 2 DD, mild AI, sev dil LA, mod reduced RV fxn, PASP 50mmHg.  Marland Kitchen Coronary artery disease    a. 2011 s/p CABG x 3.  . GERD (gastroesophageal reflux disease)    takes protonix q3 days as of 12/2015  . Hyperlipidemia   . Hypertension   . Hypertensive heart disease with heart failure ([place])   . Ischemic cardiomyopathy  a. 03/2014 Echo: EF 20-25%;  b. 06/2014 s/p BSX single lead AICD, ser # 564332.  . Marijuana abuse   . MI (myocardial infarction) 03/2011  . Noncompliance   . Perirectal abscess    once prior as of 12/2015  . Sciatica   . STD (sexually transmitted disease)   . Tobacco abuse   . Wears glasses    Past Surgical History:  Procedure Laterality Date  . BYPASS GRAFT  2011   3 vessel  . EP IMPLANTABLE DEVICE  06/17/2014   DR [name]  . FOOT SURGERY     trauma repair, 51yo, lawnmower incident  . IMPLANTABLE CARDIOVERTER DEFIBRILLATOR IMPLANT N/A 06/17/2014   Procedure: IMPLANTABLE CARDIOVERTER DEFIBRILLATOR IMPLANT;  Surgeon: Deboraha Sprang, MD;  Location: [facility] CATH LAB;  Service:  Cardiovascular;  Laterality: N/A;  . INCISION AND DRAINAGE PERIRECTAL ABSCESS N/A 06/13/2015   Procedure: IRRIGATION AND DEBRIDEMENT PERIRECTAL ABSCESS;  Surgeon: Michael Boston, MD;  Location: [facility] ORS;  Service: General;  Laterality: N/A;   Current Outpatient Prescriptions on File Prior to Visit  Medication Sig Dispense Refill  . amLODipine (NORVASC) 10 MG tablet Take 1 tablet (10 mg total) by mouth daily. 30 tablet 0  . aspirin EC 81 MG tablet Take 1 tablet (81 mg total) by mouth daily. 90 tablet 3  . carvedilol (COREG) 25 MG tablet Take 1 tablet (25 mg total) by mouth 2 (two) times daily with a meal. 60 tablet 3  . furosemide (LASIX) 40 MG tablet Take 1 tablet (40 mg total) by mouth daily. 30 tablet 3  . lisinopril (PRINIVIL,ZESTRIL) 20 MG tablet Take 1 tablet (20 mg total) by mouth daily. 30 tablet 3  . NITROSTAT 0.4 MG SL tablet Place 1 tablet (0.4 mg total) under the tongue every 5 (five) minutes as needed for chest pain. Please call office. 25 tablet 3  . pantoprazole (PROTONIX) 40 MG tablet Take 1 tablet (40 mg total) by mouth daily. 90 tablet 1  . potassium chloride SA (K-DUR,KLOR-CON) 20 MEQ tablet Take 1 tablet (20 mEq total) by mouth daily. 30 tablet 3  . sildenafil (VIAGRA) 100 MG tablet Take 1 tablet (100 mg total) by mouth as needed for erectile dysfunction. 6 tablet 3   No current facility-administered medications on file prior to visit.     ROS as in subjective   Objective: BP (!) 150/90   Pulse 71   Wt 194 lb 6.4 oz (88.2 kg)   SpO2 98%   BMI 27.11 kg/m   BP Readings from Last 3 Encounters:  06/17/16 (!) 150/90  06/07/16 (!) 160/95  05/20/16 (!) 166/98   Wt Readings from Last 3 Encounters:  06/17/16 194 lb 6.4 oz (88.2 kg)  06/07/16 180 lb 14.4 oz (82.1 kg)  05/20/16 194 lb (88 kg)   Gen: wd, wn, nad Lungs clear Heart: rrr, normal s1, s2, no murmurs Abdomen: nontender, RLQ bruising purplish/yellow superficial from injections in the hospital, no mass, no  organomegaly Ext: no edema decreased bilat pedal pulses 1+ Left anterior forearm linear surgical scar, 1+ left radial pulse, right UE 2+ pulse   Assessment: Encounter Diagnoses  Name Primary?  . Acute combined systolic and diastolic congestive heart failure ([place]) Yes  . Acute on chronic combined systolic and diastolic congestive heart failure, NYHA class 3 ([place])   . CAD, multiple vessel s/p CABG 2011    . Hypertensive heart disease with heart failure ([place])   . Ischemic cardiomyopathy   . Constipation, unspecified constipation type   .  Blood in stool   . Claudication of both lower extremities ([place])      Plan: Discussed his recent hospitalization, d/c summary, his last visit here in 12/2015.  Reviewed recent labs, echo, cardiology visit from early 05/2016.  Counseled on tobacco use, daily weights, diet, exercise, medication compliance.   He has not been taking coreg BID, just once daily.  Go back to BID, c/t lasix and lisinopril daily, limit salt, cut way back on soda, c/t amlodipine which was recently increased to 10mg  daily.     Discussed the following recommendations.    Patient Instructions  Recommendations:  Follow up with cardiology next week  Ask cardiology about once daily Coreg CR or other needed med changes  Check you weight daily. Your baseline is 194lb.   If you see >3 lb increase in a day, or > 5lb increase over a few days, call us or cardiology immediately  Ask cardiology about walking program or cardiac rehab  Limit salt, cut out soda, be careful to monitor your salt intake.    Take miralax daily to help with bowels, have a consistent diet  We will rescheduled the gastroenterology visit   STOP smoking!  continue to take your medications EVERY DAY  F/u with cardiology next week, we will refer back to GI  Gilles was seen today for constipation.  Diagnoses and all orders for this visit:  Acute combined systolic and diastolic congestive heart failure  (HCC)  Acute on chronic combined systolic and diastolic congestive heart failure, NYHA class 3 (HCC)  CAD, multiple vessel s/p CABG 2011   Hypertensive heart disease with heart failure (HCC)  Ischemic cardiomyopathy  Constipation, unspecified constipation type -     Ambulatory referral to Gastroenterology  Blood in stool -     Ambulatory referral to Gastroenterology  Claudication of both lower extremities ([place])

## 2016-06-22 NOTE — Progress Notes (Deleted)
Cardiology Office Note    Date:  06/22/2016   ID:  Roger Martinez, DOB 1965/11/07, MRN [id number]  PCP:  Crisoforo Oxford, PA-C  Cardiologist:  Dr. [name] / Dr. [name] (EP)  CC: post hospital follow up  History of Present Illness:  Roger Martinez is a 51 y.o. male with a history of CAD s/p CABG x3V (8841), chronic systolic CHF, ICM (EF 66-06%) s/p ICD, HTN, tobacco abuse, and medical non compliance who presents to clinic for post hospital follow up.   Patient was previously cared for in [place], [place]. He underwent angioplasty of the OM branch and subsequent non-STEMI. LHC demonstrated severe 3 vessel CAD and he underwent CABG (LIMA-LAD, SVG-OM2, free radial-PDA). He has known cardiomyopathy with EF 35% at the time of his bypass. He was previously a patient of Dr. [name]. He is now followed by Dr. [name] and Dr. [name]. Ejection fraction has remained less than 35%. He underwent ICD implantation in 3/16.  Review of records state that he is intolerant to spiro, entresto and hydralazine. He takes PDE-5 inhibitors and cannot take nitrates.   He was admitted to [facility] 2/24-2/26/18 for malignant HTN with acute respiratory failure 2/2 A/C CHF. He had flash pulmonary edema which improved with BIPAP, lasix and nitro. 2D ECHO during admission showed EF 30-35% w/ diffuse HK with inferior AK, severe LVH, mild AR, mild MR, severe LAE. With severe LVH, there was a note to consider MRI to r/o amlyoid or non obst HOCM. Had troponin elevation thought to be demand ischemia in setting of flash pulmonary edema. Discharge weight 180lbs.   He saw PCP for TOC visit and complained of exertional LE pain.   Today he presents to clinic for follow up.    Past Medical History:  Diagnosis Date  . Anxiety state, unspecified   . Chronic combined systolic (congestive) and diastolic (congestive) heart failure    a. 03/2014 Echo: EF 20-25%, diff HK, Gr 2 DD, mild AI, sev dil LA, mod reduced RV fxn, PASP 69mmHg.  Marland Kitchen  Coronary artery disease    a. 2011 s/p CABG x 3.  . GERD (gastroesophageal reflux disease)    takes protonix q3 days as of 12/2015  . Hyperlipidemia   . Hypertension   . Hypertensive heart disease with heart failure ([place])   . Ischemic cardiomyopathy    a. 03/2014 Echo: EF 20-25%;  b. 06/2014 s/p BSX single lead AICD, ser # 301601.  . Marijuana abuse   . MI (myocardial infarction) 03/2011  . Noncompliance   . Perirectal abscess    once prior as of 12/2015  . Sciatica   . STD (sexually transmitted disease)   . Tobacco abuse   . Wears glasses     Past Surgical History:  Procedure Laterality Date  . BYPASS GRAFT  2011   3 vessel  . EP IMPLANTABLE DEVICE  06/17/2014   DR [name]  . FOOT SURGERY     trauma repair, 51yo, lawnmower incident  . IMPLANTABLE CARDIOVERTER DEFIBRILLATOR IMPLANT N/A 06/17/2014   Procedure: IMPLANTABLE CARDIOVERTER DEFIBRILLATOR IMPLANT;  Surgeon: Deboraha Sprang, MD;  Location: [facility] CATH LAB;  Service: Cardiovascular;  Laterality: N/A;  . INCISION AND DRAINAGE PERIRECTAL ABSCESS N/A 06/13/2015   Procedure: IRRIGATION AND DEBRIDEMENT PERIRECTAL ABSCESS;  Surgeon: Michael Boston, MD;  Location: [facility] ORS;  Service: General;  Laterality: N/A;    Current Medications: Outpatient Medications Prior to Visit  Medication Sig Dispense Refill  . amLODipine (NORVASC) 10 MG tablet Take 1 tablet (  10 mg total) by mouth daily. 30 tablet 0  . aspirin EC 81 MG tablet Take 1 tablet (81 mg total) by mouth daily. 90 tablet 3  . carvedilol (COREG) 25 MG tablet Take 1 tablet (25 mg total) by mouth 2 (two) times daily with a meal. 60 tablet 3  . furosemide (LASIX) 40 MG tablet Take 1 tablet (40 mg total) by mouth daily. 30 tablet 3  . lisinopril (PRINIVIL,ZESTRIL) 20 MG tablet Take 1 tablet (20 mg total) by mouth daily. 30 tablet 3  . NITROSTAT 0.4 MG SL tablet Place 1 tablet (0.4 mg total) under the tongue every 5 (five) minutes as needed for chest pain. Please call office. 25 tablet 3  .  pantoprazole (PROTONIX) 40 MG tablet Take 1 tablet (40 mg total) by mouth daily. 90 tablet 1  . potassium chloride SA (K-DUR,KLOR-CON) 20 MEQ tablet Take 1 tablet (20 mEq total) by mouth daily. 30 tablet 3  . sildenafil (VIAGRA) 100 MG tablet Take 1 tablet (100 mg total) by mouth as needed for erectile dysfunction. 6 tablet 3   No facility-administered medications prior to visit.      Allergies:   Patient has no known allergies.   Social History   Social History  . Marital status: Married    Spouse name: N/A  . Number of children: N/A  . Years of education: N/A   Social History Main Topics  . Smoking status: Current Some Day Smoker    Packs/day: 0.50    Years: 23.00  . Smokeless tobacco: Never Used  . Alcohol use 0.6 oz/week    1 Shots of liquor per week     Comment: occ  . Drug use: Yes    Types: Marijuana     Comment: none for 2 weeks  . Sexual activity: Not on file   Other Topics Concern  . Not on file   Social History Narrative   Lives in [place] with wife. Walking some for exercise.  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  Has 3 children in [place], [place].   Christian.  12/2015     Family History:  The patient's ***family history includes Alzheimer's disease in his father; Diabetes in his mother; Gallbladder disease in his sister; Heart disease in his mother; Hypertension in his mother; Other in his brother.      *** ROS/PE    Wt Readings from Last 3 Encounters:  06/17/16 194 lb 6.4 oz (88.2 kg)  06/07/16 180 lb 14.4 oz (82.1 kg)  05/20/16 194 lb (88 kg)      Studies/Labs Reviewed:   EKG:  EKG is*** ordered today.  The ekg ordered today demonstrates ***  Recent Labs: 06/05/2016: B Natriuretic Peptide 1,422.2 06/06/2016: ALT 11 06/07/2016: BUN 14; Creatinine, Ser 1.29; Hemoglobin 15.6; Magnesium 2.2; Platelets 244; Potassium 3.3; Sodium 139   Lipid Panel    Component Value Date/Time    CHOL 175 12/17/2015 0928   TRIG 80 12/17/2015 0928   HDL 59 12/17/2015 0928   CHOLHDL 3.0 12/17/2015 0928   VLDL 16 12/17/2015 0928   LDLCALC 100 12/17/2015 0928    Additional studies/ records that were reviewed today include:   Echo 05/08/15 Severe concentric LVH greater at the septum, EF 25-30%, diffuse HK, grade 2 diastolic dysfunction, mild to moderate AI, mild MR, severe LAE, mildly reduced RVSF, mild RAE increased left atrial pressure, moderate TR, PASP 33 mmHg, GLS -7%   2D  ECHO: 06/06/2016 Study Conclusions - Left ventricle: Diffuse hypokinesis with inferior akinesis at mid   and basal level. The cavity size was moderately dilated. Wall   thickness was increased in a pattern of severe LVH. Systolic   function was moderately to severely reduced. The estimated   ejection fraction was in the range of 30% to 35%. Doppler   parameters are consistent with both elevated ventricular   end-diastolic filling pressure and elevated left atrial filling   pressure. - Aortic valve: There was mild regurgitation. Valve area (VTI):   2.93 cm^2. Valve area (Vmax): 2.66 cm^2. Valve area (Vmean): 2.67   cm^2. - Mitral valve: There was mild regurgitation. - Left atrium: The atrium was severely dilated. - Pulmonary arteries: PA peak pressure: 35 mm Hg (S). - Impressions: Severe LVH consider MRI to r/o amyloid or non   obstructive HOCM if clinically indicated. Impressions: - Severe LVH consider MRI to r/o amyloid or non obstructive HOCM if   clinically indicated.   ASSESSMENT & PLAN:   Hypertensive heart disease with heart failure:  Severe LVH:  Chronic combined S/D CHF: Review of records state that he is intolerant to spiro, entresto and hydralazine. He takes PDE-5 inhibitors and cannot take nitrates.   CAD:  HLD:  LE pain:  Non compliance:  Tobacco abuse:  Medication Adjustments/Labs and Tests Ordered: Current medicines are reviewed at length with the patient today.   Concerns regarding medicines are outlined above.  Medication changes, Labs and Tests ordered today are listed in the Patient Instructions below. There are no Patient Instructions on file for this visit.   Signed, Angelena Form, PA-C  06/22/2016 12:46 PM    [facility] [address], [place], [place]  [address] Phone: [phone]; Fax: [phone]

## 2016-06-23 ENCOUNTER — Ambulatory Visit: Payer: PRIVATE HEALTH INSURANCE | Admitting: Physician Assistant

## 2016-06-25 ENCOUNTER — Ambulatory Visit: Payer: PRIVATE HEALTH INSURANCE | Admitting: Gastroenterology

## 2016-06-30 NOTE — Progress Notes (Deleted)
Cardiology Office Note    Date:  06/30/2016   ID:  Roger Martinez, DOB [birth year], MRN [id number]  PCP:  Crisoforo Oxford, PA-C  Cardiologist:  Dr. [name] / Dr. [name] (EP)  CC: post hospital follow up  History of Present Illness:  Roger Martinez is a 51 y.o. male with a history of CAD s/p CABG x3V (3875), chronic systolic CHF, ICM (EF 64-33%) s/p ICD, HTN, tobacco abuse, and medical non compliance who presents to clinic for post hospital follow up.   Patient was previously cared for in [place], [place]. He underwent angioplasty of the OM branch and subsequent non-STEMI. LHC demonstrated severe 3 vessel CAD and he underwent CABG (LIMA-LAD, SVG-OM2, free radial-PDA). He has known cardiomyopathy with EF 35% at the time of his bypass. He was previously a patient of Dr. [name]. He is now followed by Dr. [name] and Dr. [name]. Ejection fraction has remained less than 35%. He underwent ICD implantation in 3/16.  Review of records state that he is intolerant to spiro, entresto and hydralazine. He takes PDE-5 inhibitors and cannot take nitrates.   He was admitted to [facility] 2/24-2/26/18 for malignant HTN with acute respiratory failure 2/2 A/C CHF. He had flash pulmonary edema which improved with BIPAP, lasix and nitro. 2D ECHO during admission showed EF 30-35% w/ diffuse HK with inferior AK, severe LVH, mild AR, mild MR, severe LAE. With severe LVH, there was a note to consider MRI to r/o amlyoid or non obst HOCM. Had troponin elevation thought to be demand ischemia in setting of flash pulmonary edema. Discharge weight 180lbs.   He saw PCP for TOC visit and complained of exertional LE pain.   Today he presents to clinic for follow up.    Past Medical History:  Diagnosis Date  . Anxiety state, unspecified   . Chronic combined systolic (congestive) and diastolic (congestive) heart failure    a. 03/2014 Echo: EF 20-25%, diff HK, Gr 2 DD, mild AI, sev dil LA, mod reduced RV fxn, PASP 56mmHg.    Marland Kitchen Coronary artery disease    a. 2011 s/p CABG x 3.  . GERD (gastroesophageal reflux disease)    takes protonix q3 days as of 12/2015  . Hyperlipidemia   . Hypertension   . Hypertensive heart disease with heart failure ([place])   . Ischemic cardiomyopathy    a. 03/2014 Echo: EF 20-25%;  b. 06/2014 s/p BSX single lead AICD, ser # [id number].  . Marijuana abuse   . MI (myocardial infarction) 03/2011  . Noncompliance   . Perirectal abscess    once prior as of 12/2015  . Sciatica   . STD (sexually transmitted disease)   . Tobacco abuse   . Wears glasses     Past Surgical History:  Procedure Laterality Date  . BYPASS GRAFT  2011   3 vessel  . EP IMPLANTABLE DEVICE  06/17/2014   DR [name]  . FOOT SURGERY     trauma repair, 51yo, lawnmower incident  . IMPLANTABLE CARDIOVERTER DEFIBRILLATOR IMPLANT N/A 06/17/2014   Procedure: IMPLANTABLE CARDIOVERTER DEFIBRILLATOR IMPLANT;  Surgeon: Deboraha Sprang, MD;  Location: [facility] CATH LAB;  Service: Cardiovascular;  Laterality: N/A;  . INCISION AND DRAINAGE PERIRECTAL ABSCESS N/A 06/13/2015   Procedure: IRRIGATION AND DEBRIDEMENT PERIRECTAL ABSCESS;  Surgeon: Michael Boston, MD;  Location: [facility] ORS;  Service: General;  Laterality: N/A;    Current Medications: Outpatient Medications Prior to Visit  Medication Sig Dispense Refill  . amLODipine (NORVASC) 10 MG tablet Take 1  tablet (10 mg total) by mouth daily. 30 tablet 0  . aspirin EC 81 MG tablet Take 1 tablet (81 mg total) by mouth daily. 90 tablet 3  . carvedilol (COREG) 25 MG tablet Take 1 tablet (25 mg total) by mouth 2 (two) times daily with a meal. 60 tablet 3  . furosemide (LASIX) 40 MG tablet Take 1 tablet (40 mg total) by mouth daily. 30 tablet 3  . lisinopril (PRINIVIL,ZESTRIL) 20 MG tablet Take 1 tablet (20 mg total) by mouth daily. 30 tablet 3  . NITROSTAT 0.4 MG SL tablet Place 1 tablet (0.4 mg total) under the tongue every 5 (five) minutes as needed for chest pain. Please call office. 25 tablet 3  .  pantoprazole (PROTONIX) 40 MG tablet Take 1 tablet (40 mg total) by mouth daily. 90 tablet 1  . potassium chloride SA (K-DUR,KLOR-CON) 20 MEQ tablet Take 1 tablet (20 mEq total) by mouth daily. 30 tablet 3  . sildenafil (VIAGRA) 100 MG tablet Take 1 tablet (100 mg total) by mouth as needed for erectile dysfunction. 6 tablet 3   No facility-administered medications prior to visit.      Allergies:   Patient has no known allergies.   Social History   Social History  . Marital status: Married    Spouse name: N/A  . Number of children: N/A  . Years of education: N/A   Social History Main Topics  . Smoking status: Current Some Day Smoker    Packs/day: 0.50    Years: 23.00  . Smokeless tobacco: Never Used  . Alcohol use 0.6 oz/week    1 Shots of liquor per week     Comment: occ  . Drug use: Yes    Types: Marijuana     Comment: none for 2 weeks  . Sexual activity: Not on file   Other Topics Concern  . Not on file   Social History Narrative   Lives in [place] with wife. Walking some for exercise.  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  Has 3 children in [place], [place].   Christian.  12/2015     Family History:  The patient's ***family history includes Alzheimer's disease in his father; Diabetes in his mother; Gallbladder disease in his sister; Heart disease in his mother; Hypertension in his mother; Other in his brother.      *** ROS/PE    Wt Readings from Last 3 Encounters:  06/17/16 194 lb 6.4 oz (88.2 kg)  06/07/16 180 lb 14.4 oz (82.1 kg)  05/20/16 194 lb (88 kg)      Studies/Labs Reviewed:   EKG:  EKG is*** ordered today.  The ekg ordered today demonstrates ***  Recent Labs: 06/05/2016: B Natriuretic Peptide 1,422.2 06/06/2016: ALT 11 06/07/2016: BUN 14; Creatinine, Ser 1.29; Hemoglobin 15.6; Magnesium 2.2; Platelets 244; Potassium 3.3; Sodium 139   Lipid Panel    Component Value Date/Time    CHOL 175 12/17/2015 0928   TRIG 80 12/17/2015 0928   HDL 59 12/17/2015 0928   CHOLHDL 3.0 12/17/2015 0928   VLDL 16 12/17/2015 0928   LDLCALC 100 12/17/2015 0928    Additional studies/ records that were reviewed today include:  Echo 05/08/15 Severe concentric LVH greater at the septum, EF 25-30%, diffuse HK, grade 2 diastolic dysfunction, mild to moderate AI, mild MR, severe LAE, mildly reduced RVSF, mild RAE increased left atrial pressure, moderate TR, PASP 33 mmHg, GLS -7%   2D  ECHO: 06/06/2016 Study Conclusions - Left ventricle: Diffuse hypokinesis with inferior akinesis at mid and basal level. The cavity size was moderately dilated. Wall thickness was increased in a pattern of severe LVH. Systolic function was moderately to severely reduced. The estimated ejection fraction was in the range of 30% to 35%. Doppler parameters are consistent with both elevated ventricular end-diastolic filling pressure and elevated left atrial filling pressure. - Aortic valve: There was mild regurgitation. Valve area (VTI): 2.93 cm^2. Valve area (Vmax): 2.66 cm^2. Valve area (Vmean): 2.67 cm^2. - Mitral valve: There was mild regurgitation. - Left atrium: The atrium was severely dilated. - Pulmonary arteries: PA peak pressure: 35 mm Hg (S). - Impressions: Severe LVH consider MRI to r/o amyloid or non obstructive HOCM if clinically indicated. Impressions: - Severe LVH consider MRI to r/o amyloid or non obstructive HOCM if clinically indicated.   ASSESSMENT & PLAN:   Hypertensive heart disease with heart failure:  Severe LVH:  Chronic combined S/D CHF: Review of records state that he is intolerant to spiro, entresto and hydralazine. He takes PDE-5 inhibitors and cannot take nitrates.   CAD:  HLD:  LE pain:  Non compliance:  Tobacco abuse:        Medication Adjustments/Labs and Tests Ordered: Current medicines are reviewed at length with  the patient today.  Concerns regarding medicines are outlined above.  Medication changes, Labs and Tests ordered today are listed in the Patient Instructions below. There are no Patient Instructions on file for this visit.   Signed, Angelena Form, PA-C  06/30/2016 12:38 PM    [facility] [place] [address], [place], [place]  [address] Phone: [phone]; Fax: [phone]

## 2016-07-01 ENCOUNTER — Ambulatory Visit: Payer: PRIVATE HEALTH INSURANCE | Admitting: Physician Assistant

## 2016-07-07 ENCOUNTER — Telehealth: Payer: Self-pay | Admitting: *Deleted

## 2016-07-07 ENCOUNTER — Ambulatory Visit (INDEPENDENT_AMBULATORY_CARE_PROVIDER_SITE_OTHER): Payer: PRIVATE HEALTH INSURANCE | Admitting: Gastroenterology

## 2016-07-07 ENCOUNTER — Encounter: Payer: Self-pay | Admitting: Gastroenterology

## 2016-07-07 VITALS — BP 130/90 | HR 64 | Ht 71.0 in | Wt 183.1 lb

## 2016-07-07 DIAGNOSIS — Z1211 Encounter for screening for malignant neoplasm of colon: Secondary | ICD-10-CM

## 2016-07-07 MED ORDER — NA SULFATE-K SULFATE-MG SULF 17.5-3.13-1.6 GM/177ML PO SOLN: ORAL | 0 refills | Status: DC

## 2016-07-07 NOTE — Progress Notes (Signed)
07/07/2016 Roger Martinez [id number] 1965/08/16   HISTORY OF PRESENT ILLNESS:  This is a 51 year old male who is new to our practice.  He presents to our office today to discuss colonoscopy.  He's never had one in the past.  He says that he had been experiencing some constipation and had seen some small amounts of bright red blood on a few occassions when he would be straining or have a hard stool.  He says that he is sure the constipation was from his poor diet and he has now been eating better.  Eating oatmeal every day for breakfast and has been moving his bowels just fine, "like clockwork".  He has not seen any further blood.  No other complaints at this time.  He has history of CAD with CABG x 3 in 2011, HTN, HLD, combined systolic and diastolic CHF with EF of 11-57%.  Has an ICD as well.  Referred here by Chana Bode, PA-C.  Past Medical History:  Diagnosis Date  . Anxiety state, unspecified   . Chronic combined systolic (congestive) and diastolic (congestive) heart failure    a. 03/2014 Echo: EF 20-25%, diff HK, Gr 2 DD, mild AI, sev dil LA, mod reduced RV fxn, PASP 10mmHg.  Marland Kitchen Coronary artery disease    a. 2011 s/p CABG x 3.  . GERD (gastroesophageal reflux disease)    takes protonix q3 days as of 12/2015  . Hyperlipidemia   . Hypertension   . Hypertensive heart disease with heart failure ([place])   . Ischemic cardiomyopathy    a. 03/2014 Echo: EF 20-25%;  b. 06/2014 s/p BSX single lead AICD, ser # 262035.  . Marijuana abuse   . MI (myocardial infarction) 03/2011  . Noncompliance   . Perirectal abscess    once prior as of 12/2015  . Sciatica   . STD (sexually transmitted disease)   . Tobacco abuse   . Wears glasses    Past Surgical History:  Procedure Laterality Date  . BYPASS GRAFT  2011   3 vessel  . EP IMPLANTABLE DEVICE  06/17/2014   DR [name]  . FOOT SURGERY     trauma repair, 51yo, lawnmower incident  . IMPLANTABLE CARDIOVERTER DEFIBRILLATOR IMPLANT N/A  06/17/2014   Procedure: IMPLANTABLE CARDIOVERTER DEFIBRILLATOR IMPLANT;  Surgeon: Deboraha Sprang, MD;  Location: [facility] CATH LAB;  Service: Cardiovascular;  Laterality: N/A;  . INCISION AND DRAINAGE PERIRECTAL ABSCESS N/A 06/13/2015   Procedure: IRRIGATION AND DEBRIDEMENT PERIRECTAL ABSCESS;  Surgeon: Michael Boston, MD;  Location: [facility];  Service: General;  Laterality: N/A;    reports that he has been smoking.  He has a 11.50 pack-year smoking history. He has never used smokeless tobacco. He reports that he drinks about 0.6 oz of alcohol per week . He reports that he uses drugs, including Marijuana. family history includes Alzheimer's disease in his father; Diabetes in his mother; Gallbladder disease in his sister; Heart disease in his mother; Hypertension in his mother; Other in his brother. No Known Allergies    Outpatient Encounter Prescriptions as of 07/07/2016  Medication Sig  . amLODipine (NORVASC) 10 MG tablet Take 1 tablet (10 mg total) by mouth daily.  Marland Kitchen aspirin EC 81 MG tablet Take 1 tablet (81 mg total) by mouth daily.  . carvedilol (COREG) 25 MG tablet Take 1 tablet (25 mg total) by mouth 2 (two) times daily with a meal.  . furosemide (LASIX) 40 MG tablet Take 1 tablet (40 mg total)  by mouth daily.  Marland Kitchen lisinopril (PRINIVIL,ZESTRIL) 20 MG tablet Take 1 tablet (20 mg total) by mouth daily.  Marland Kitchen NITROSTAT 0.4 MG SL tablet Place 1 tablet (0.4 mg total) under the tongue every 5 (five) minutes as needed for chest pain. Please call office.  . pantoprazole (PROTONIX) 40 MG tablet Take 1 tablet (40 mg total) by mouth daily.  . potassium chloride SA (K-DUR,KLOR-CON) 20 MEQ tablet Take 1 tablet (20 mEq total) by mouth daily.  . sildenafil (VIAGRA) 100 MG tablet Take 1 tablet (100 mg total) by mouth as needed for erectile dysfunction.   No facility-administered encounter medications on file as of 07/07/2016.      REVIEW OF SYSTEMS  : All other systems reviewed and negative except where noted in the  History of Present Illness.   PHYSICAL EXAM: BP 130/90   Pulse 64   Ht 5\' 11"  (1.803 m)   Wt 183 lb 2 oz (83.1 kg)   BMI 25.54 kg/m  General: Well developed black male in no acute distress Head: Normocephalic and atraumatic Eyes:  Sclerae anicteric, conjunctiva pink. Ears: Normal auditory acuity Lungs: Clear throughout to auscultation Heart: Regular rate and rhythm Abdomen: Soft, non-distended.  Normal bowel sounds.  Non-tender. Rectal:  Will be done at the time of colonoscopy. Musculoskeletal: Symmetrical with no gross deformities  Skin: No lesions on visible extremities Extremities: No edema  Neurological: Alert oriented x 4, grossly non-focal Psychological:  Alert and cooperative. Normal mood and affect  ASSESSMENT AND PLAN: -Screening colonoscopy:  Never had one in the past.  Will schedule with Dr. Silverio Decamp at [facility] hospital due to EF of 30-35%.  The risks, benefits, and alternatives to colonoscopy were discussed with the patient and he consents to proceed.  -Rectal bleeding:  Small amounts of bright red blood when he was constipated.  Now moving his bowels well so is no longer seeing blood.   CC:  Tysinger, Camelia Eng, PA-C

## 2016-07-07 NOTE — Telephone Encounter (Signed)
CALLED WANTING HIS VIAGRA TRANSFERRED BACK TO [facility], EXPLIANED THAT HE CAN REQUEST THIS BUT THAT I WOULD CALL THEM. PT EXPRESSED UNDERSTANDING, I ASK [facility] TO MAKE PT AWARE HE DOES NOT HAVE TO CALL us EVERY MONTH. SPOKE WITH TAYVON  TAYVON STATED "PT HAS FIVE MEDICATION READY, VIAGRA INCLUDED AND CANT UNDERSTAND WHY THE PT IS CALLING us". WILL INFORM PT OF THIS AND THAT FROM NOW ON HE NEEDS TO CALL [facility]. PT EXPRESSED UNDERSTANDING.

## 2016-07-07 NOTE — Patient Instructions (Signed)
Normal BMI (Body Mass Index- based on height and weight) is between 19 and 25. Your BMI today is Body mass index is 25.54 kg/m. Marland Kitchen Please consider follow up  regarding your BMI with your Primary Care Provider.  You have been scheduled for a colonoscopy. Please follow written instructions given to you at your visit today.  Please pick up your prep supplies at the pharmacy within the next 1-3 days. If you use inhalers (even only as needed), please bring them with you on the day of your procedure. Your physician has requested that you go to www.startemmi.com and enter the access code given to you at your visit today. This web site gives a general overview about your procedure. However, you should still follow specific instructions given to you by our office regarding your preparation for the procedure.    I appreciate the opportunity to care for you.

## 2016-07-08 NOTE — Progress Notes (Signed)
Reviewed and agree with documentation and assessment and plan. K. Veena Gisell Buehrle , MD   

## 2016-07-15 NOTE — Progress Notes (Signed)
Cardiology Office Note    Date:  07/16/2016   ID:  Roger Martinez, DOB Jan 28, 1966, MRN [id number]  PCP:  Crisoforo Oxford, PA-C  Cardiologist:Dr. [name] / Dr. [name] (EP)  TM:AUQJ hospital follow up (rescheduled many times)  History of Present Illness:  Roger Martinez a 51 y.o.malewith a history of CAD s/p previous PCI and CABG x3V (3354), chronic systolic CHF, ICM (EF 56-25%) s/p ICD, known PAD, HTN, ongoing tobacco abuse, and medical non compliance who presents to clinic for post hospital follow up.   Patient was previously cared for in [place], [place]. In 2011, he had NSTEMI and underwent angioplasty of the OM branch. He subsequently underwent CABG (LIMA-LAD, SVG-OM2, free radial-PDA). He has known cardiomyopathy with EF 35% at the time of his bypass. He was previously a patient of Dr. [name]. He is now followed by Dr. [name] and Dr. [name]. Ejection fraction has remained less than 35%. He underwent ICD implantation in 3/16.  Review of records state that he is intolerant to spiro, entresto and hydralazine. He takes PDE-5 inhibitors and cannot take nitrates.   He is followed by Dr. Trula Slade for known PAD with claudication. Plan was for tobacco cessation, medical therapy, and walking program.   He was admitted to [facility] 2/24-2/26/18 for malignant HTN with acute respiratory failure 2/2 A/C CHF. He had flash pulmonary edema which improved with BIPAP, lasix and nitro. 2D ECHO during admission showed EF 30-35% w/ diffuse HK with inferior AK, severe LVH, mild AR, mild MR, severe LAE. With severe LVH, there was a note to consider MRI to r/o amlyoid or non obst HOCM. Had troponin elevation thought to be demand ischemia in setting of flash pulmonary edema. Discharge weight 180lbs.    Today he presents to clinic for follow up. Recently he has been doing well. He has learned that he really has to take his medications in order to keep himself healthy. He recently has had some blood in his  stool and constipation and is set up for a colonoscopy. 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 He does not exertional chest pain or SOB, but tries to avoid exerting himself. He walks M, W, F for exercise. He still gets LE claudication around 2 miles that improves with rest.  He has really been trying to focus on diet and exercise. Eating healthier and taking medications. Still smoking about 6 cigs a day. No LE edema, orthopnea or PND. No dizziness or syncope. No palpitations.      Past Medical History:  Diagnosis Date  . Anxiety state, unspecified   . Chronic combined systolic (congestive) and diastolic (congestive) heart failure    a. 03/2014 Echo: EF 20-25%, diff HK, Gr 2 DD, mild AI, sev dil LA, mod reduced RV fxn, PASP 53mHg.  .Marland KitchenCoronary artery disease    a. 2011 s/p CABG x 3.  . GERD (gastroesophageal reflux disease)    takes protonix q3 days as of 12/2015  . Hyperlipidemia   . Hypertension   . Hypertensive heart disease with heart failure ([place]   . Ischemic cardiomyopathy    a. 03/2014 Echo: EF 20-25%;  b. 06/2014 s/p BSX single lead AICD, ser # [id number]  . Marijuana abuse   . MI (myocardial infarction) 03/2011  . Noncompliance   . Perirectal abscess    once prior as of 12/2015  . Sciatica   . STD (sexually transmitted disease)   . Tobacco abuse   .  Wears glasses     Past Surgical History:  Procedure Laterality Date  . BYPASS GRAFT  2011   3 vessel  . EP IMPLANTABLE DEVICE  06/17/2014   DR [name]  . FOOT SURGERY     trauma repair, 51yo, lawnmower incident  . IMPLANTABLE CARDIOVERTER DEFIBRILLATOR IMPLANT N/A 06/17/2014   Procedure: IMPLANTABLE CARDIOVERTER DEFIBRILLATOR IMPLANT;  Surgeon: Deboraha Sprang, MD;  Location: [facility] CATH LAB;  Service: Cardiovascular;  Laterality: N/A;  . INCISION AND DRAINAGE PERIRECTAL ABSCESS N/A 06/13/2015   Procedure: IRRIGATION AND DEBRIDEMENT PERIRECTAL ABSCESS;  Surgeon: Michael Boston, MD;  Location: [facility]  ORS;  Service: General;  Laterality: N/A;    Current Medications: Outpatient Medications Prior to Visit  Medication Sig Dispense Refill  . amLODipine (NORVASC) 10 MG tablet Take 1 tablet (10 mg total) by mouth daily. 30 tablet 0  . aspirin EC 81 MG tablet Take 1 tablet (81 mg total) by mouth daily. 90 tablet 3  . carvedilol (COREG) 25 MG tablet Take 1 tablet (25 mg total) by mouth 2 (two) times daily with a meal. 60 tablet 3  . furosemide (LASIX) 40 MG tablet Take 1 tablet (40 mg total) by mouth daily. 30 tablet 3  . lisinopril (PRINIVIL,ZESTRIL) 20 MG tablet Take 1 tablet (20 mg total) by mouth daily. 30 tablet 3  . Na Sulfate-K Sulfate-Mg Sulf (SUPREP BOWEL PREP KIT) 17.5-3.13-1.6 GM/180ML SOLN Use as directed 2 Bottle 0  . NITROSTAT 0.4 MG SL tablet Place 1 tablet (0.4 mg total) under the tongue every 5 (five) minutes as needed for chest pain. Please call office. 25 tablet 3  . pantoprazole (PROTONIX) 40 MG tablet Take 1 tablet (40 mg total) by mouth daily. 90 tablet 1  . potassium chloride SA (K-DUR,KLOR-CON) 20 MEQ tablet Take 1 tablet (20 mEq total) by mouth daily. 30 tablet 3  . sildenafil (VIAGRA) 100 MG tablet Take 1 tablet (100 mg total) by mouth as needed for erectile dysfunction. 6 tablet 3   No facility-administered medications prior to visit.      Allergies:   Patient has no known allergies.   Social History   Social History  . Marital status: Married    Spouse name: N/A  . Number of children: 3  . Years of education: N/A   Occupational History  . unemployed    Social History Main Topics  . Smoking status: Current Some Day Smoker    Packs/day: 0.50    Years: 23.00  . Smokeless tobacco: Never Used  . Alcohol use 0.6 oz/week    1 Shots of liquor per week     Comment: occ  . Drug use: Yes    Types: Marijuana     Comment: none for 2 weeks  . Sexual activity: Not Asked   Other Topics Concern  . None   Social History Narrative   Lives in [place] with  wife. Walking some for exercise.  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  Has 3 children in [place], [place].   Christian.  12/2015     Family History:  The patient's family history includes Alzheimer's disease in his father; Diabetes in his mother; Gallbladder disease in his sister; Heart disease in his mother; Hypertension in his mother; Other in his brother.      ROS:   Please see the history of present illness.    ROS All other systems reviewed and are negative.   PHYSICAL EXAM:  VS:  BP (!) 150/92   Pulse (!) 54   Ht _0  (1.803 m)   Wt 192 lb 6.4 oz (87.3 kg)   BMI 26.83 kg/m    GEN: Well nourished, well developed, in no acute distress  HEENT: normal  Neck: no JVD, carotid bruits, or masses Cardiac: RRR; no murmurs, rubs, or gallops,no edema  Respiratory:  clear to auscultation bilaterally, normal work of breathing GI: soft, nontender, nondistended, + BS MS: no deformity or atrophy  Skin: warm and dry, no rash Neuro:  Alert and Oriented x 3, Strength and sensation are intact Psych: euthymic mood, full affect   Wt Readings from Last 3 Encounters:  07/16/16 192 lb 6.4 oz (87.3 kg)  07/07/16 183 lb 2 oz (83.1 kg)  06/17/16 194 lb 6.4 oz (88.2 kg)      Studies/Labs Reviewed:   EKG:  EKG is ordered today.  The ekg ordered today demonstrates sinus bradycardia HR 54, IVCD,  severe LVH with repol abnormality  Recent Labs: 06/05/2016: B Natriuretic Peptide 1,422.2 06/06/2016: ALT 11 06/07/2016: BUN 14; Creatinine, Ser 1.29; Hemoglobin 15.6; Magnesium 2.2; Platelets 244; Potassium 3.3; Sodium 139   Lipid Panel    Component Value Date/Time   CHOL 175 12/17/2015 0928   TRIG 80 12/17/2015 0928   HDL 59 12/17/2015 0928   CHOLHDL 3.0 12/17/2015 0928   VLDL 16 12/17/2015 0928   LDLCALC 100 12/17/2015 0928    Additional studies/ records that were reviewed today include:  Echo 05/08/15 Severe concentric  LVH greater at the septum, EF 25-30%, diffuse HK, grade 2 diastolic dysfunction, mild to moderate AI, mild MR, severe LAE, mildly reduced RVSF, mild RAE increased left atrial pressure, moderate TR, PASP 33 mmHg, GLS -7%   2D ECHO: 06/06/2016 Study Conclusions - Left ventricle: Diffuse hypokinesis with inferior akinesis at mid and basal level. The cavity size was moderately dilated. Wall thickness was increased in a pattern of severe LVH. Systolic function was moderately to severely reduced. The estimated ejection fraction was in the range of 30% to 35%. Doppler parameters are consistent with both elevated ventricular end-diastolic filling pressure and elevated left atrial filling pressure. - Aortic valve: There was mild regurgitation. Valve area (VTI): 2.93 cm^2. Valve area (Vmax): 2.66 cm^2. Valve area (Vmean): 2.67 cm^2. - Mitral valve: There was mild regurgitation. - Left atrium: The atrium was severely dilated. - Pulmonary arteries: PA peak pressure: 35 mm Hg (S). - Impressions: Severe LVH consider MRI to r/o amyloid or non obstructive HOCM if clinically indicated. Impressions: - Severe LVH consider MRI to r/o amyloid or non obstructive HOCM if clinically indicated.   ASSESSMENT & PLAN:   Hypertensive heart disease with heart failure: BP is elevated this AM but he hasn't taken his AM meds. Continue same regimen. Appears euvoelmic.   Severe LVH: 2D ECHO 06/06/16 showed severe LVH and "consider MRI to r/o amyloid or non obstructive HOCM if clinically indicated". I discussed case with Dr. Saunders Revel and we feel that cardiac MR would not change management and will defer at this time.   Chronic combined S/D CHF: Review of records state that he is intolerant to spiro, entresto and hydralazine. He takes PDE-5 inhibitors and cannot take nitrates. Continue Coreg 26m BID, Lisinopril 249mdaily and lasix 4058maily. Will check a BMET today.   CAD s/p CABG x3V:  stable. Continue ASA and BBs. He is intolerant to pravastatin. He does not like to take many medications and is already overwhelmed by the number  he has now. He is not willing to try another statin due to it making him feel terrible. I will have him set up with lipid clinic to discuss options.   HLD: most recent LDL 100, followed by Chana Bode, PA-C. Not on a statin, see above.   PAD with LE claudication: followed by Dr. Trula Slade with [facility]. Plan is for medical therapy and walking program  Non compliance: he has really improved recently and understands the gravity of medical compliance. I have congratulated him and encouraged continued compliance.   Tobacco abuse: down to 6 cigs a day. We discussed the importance of complete cessation.   ICD: followed by Dr. [name]. Normal device function in 04/2016   Medication Adjustments/Labs and Tests Ordered: Current medicines are reviewed at length with the patient today.  Concerns regarding medicines are outlined above.  Medication changes, Labs and Tests ordered today are listed in the Patient Instructions below. Patient Instructions  Medication Instructions:  Your physician recommends that you continue on your current medications as directed. Please refer to the Current Medication list given to you today.   Labwork: TODAY:  BMET  Testing/Procedures: None ordered  Follow-Up: Your physician recommends that you schedule a follow-up appointment in: [place] DISCUSSION OF POSSIBLE PSK9 INHIBITOR  Your physician recommends that you schedule a follow-up appointment in: 3 MONTHS WITH DR. [name]    Any Other Special Instructions Will Be Listed Below (If Applicable).     If you need a refill on your cardiac medications before your next appointment, please call your pharmacy.      Signed, Angelena Form, PA-C  07/16/2016 9:26 AM    [facility] [address], [place], [place]   [address] Phone: [phone]; Fax: [phone]

## 2016-07-16 ENCOUNTER — Encounter: Payer: Self-pay | Admitting: Physician Assistant

## 2016-07-16 ENCOUNTER — Ambulatory Visit (INDEPENDENT_AMBULATORY_CARE_PROVIDER_SITE_OTHER): Payer: PRIVATE HEALTH INSURANCE | Admitting: Physician Assistant

## 2016-07-16 VITALS — BP 150/92 | HR 54 | Ht 71.0 in | Wt 192.4 lb

## 2016-07-16 DIAGNOSIS — I2510 Atherosclerotic heart disease of native coronary artery without angina pectoris: Secondary | ICD-10-CM

## 2016-07-16 DIAGNOSIS — Z9119 Patient's noncompliance with other medical treatment and regimen: Secondary | ICD-10-CM

## 2016-07-16 DIAGNOSIS — I739 Peripheral vascular disease, unspecified: Secondary | ICD-10-CM | POA: Diagnosis not present

## 2016-07-16 DIAGNOSIS — E785 Hyperlipidemia, unspecified: Secondary | ICD-10-CM | POA: Diagnosis not present

## 2016-07-16 DIAGNOSIS — I110 Hypertensive heart disease with heart failure: Secondary | ICD-10-CM | POA: Diagnosis not present

## 2016-07-16 DIAGNOSIS — I5022 Chronic systolic (congestive) heart failure: Secondary | ICD-10-CM | POA: Diagnosis not present

## 2016-07-16 DIAGNOSIS — Z720 Tobacco use: Secondary | ICD-10-CM

## 2016-07-16 DIAGNOSIS — I255 Ischemic cardiomyopathy: Secondary | ICD-10-CM

## 2016-07-16 DIAGNOSIS — Z91199 Patient's noncompliance with other medical treatment and regimen due to unspecified reason: Secondary | ICD-10-CM

## 2016-07-16 LAB — BASIC METABOLIC PANEL
BUN / CREAT RATIO: 14 (ref 9–20)
BUN: 14 mg/dL (ref 6–24)
CHLORIDE: 102 mmol/L (ref 96–106)
CO2: 25 mmol/L (ref 18–29)
Calcium: 9.8 mg/dL (ref 8.7–10.2)
Creatinine, Ser: 1 mg/dL (ref 0.76–1.27)
GFR calc Af Amer: 102 mL/min/{1.73_m2} (ref >59)
GFR calc non Af Amer: 89 mL/min/{1.73_m2} (ref >59)
GLUCOSE: 85 mg/dL (ref 65–99)
POTASSIUM: 4.3 mmol/L (ref 3.5–5.2)
Sodium: 143 mmol/L (ref 134–144)

## 2016-07-16 NOTE — Patient Instructions (Addendum)
Medication Instructions:  Your physician recommends that you continue on your current medications as directed. Please refer to the Current Medication list given to you today.   Labwork: TODAY:  BMET  Testing/Procedures: None ordered  Follow-Up: Your physician recommends that you schedule a follow-up appointment in: [place] DISCUSSION OF POSSIBLE PSK9 INHIBITOR  Your physician recommends that you schedule a follow-up appointment in: 3 MONTHS WITH DR. [name]    Any Other Special Instructions Will Be Listed Below (If Applicable).     If you need a refill on your cardiac medications before your next appointment, please call your pharmacy.

## 2016-07-20 ENCOUNTER — Ambulatory Visit (INDEPENDENT_AMBULATORY_CARE_PROVIDER_SITE_OTHER): Payer: PRIVATE HEALTH INSURANCE | Admitting: *Deleted

## 2016-07-20 DIAGNOSIS — I255 Ischemic cardiomyopathy: Secondary | ICD-10-CM

## 2016-07-20 NOTE — Progress Notes (Signed)
Remote ICD transmission.   

## 2016-07-21 ENCOUNTER — Encounter: Payer: Self-pay | Admitting: Cardiology

## 2016-07-23 LAB — CUP PACEART REMOTE DEVICE CHECK
Battery Remaining Longevity: 144 mo
Date Time Interrogation Session: 20180410045100
HIGH POWER IMPEDANCE MEASURED VALUE: 62 Ohm
Implantable Lead Location: 753860
Implantable Lead Serial Number: 349317
Implantable Pulse Generator Implant Date: 20160307
Lead Channel Pacing Threshold Pulse Width: 0.5 ms
Lead Channel Setting Pacing Amplitude: 4 V
MDC IDC LEAD IMPLANT DT: 20160307
MDC IDC MSMT BATTERY REMAINING PERCENTAGE: 100 %
MDC IDC MSMT LEADCHNL RV IMPEDANCE VALUE: 392 Ohm
MDC IDC MSMT LEADCHNL RV PACING THRESHOLD AMPLITUDE: 2 V
MDC IDC SET LEADCHNL RV PACING PULSEWIDTH: 0.5 ms
MDC IDC SET LEADCHNL RV SENSING SENSITIVITY: 0.6 mV
MDC IDC STAT BRADY RV PERCENT PACED: 0 %
Pulse Gen Serial Number: 201422

## 2016-07-26 ENCOUNTER — Telehealth: Payer: Self-pay | Admitting: Cardiology

## 2016-07-26 MED ORDER — SILDENAFIL CITRATE 100 MG PO TABS: 100.0000 mg | ORAL_TABLET | ORAL | 6 refills | Status: DC | PRN

## 2016-07-26 NOTE — Telephone Encounter (Signed)
Pt's medication was sent to pt's pharmacy as requested, with refills per Dr. [name].

## 2016-07-26 NOTE — Telephone Encounter (Signed)
Okay with additional pills of Viagra. Disp 20. Candee Furbish, MD

## 2016-07-26 NOTE — Telephone Encounter (Signed)
Will have Dr [name] approve

## 2016-07-26 NOTE — Telephone Encounter (Signed)
Pt calling requesting additional pills of viagra. Pt stated that he could get 30 pills, but he only needs 20 pills a month. Please advise

## 2016-07-28 ENCOUNTER — Encounter (HOSPITAL_COMMUNITY): Payer: Self-pay | Admitting: *Deleted

## 2016-07-28 NOTE — Progress Notes (Signed)
SPOKE WITH DR Linna Caprice ANESTHESIA AND MADE AWARE  2-2-418 CHEST XRAY RESULTS AND PT MEDICAL HISTORY, DO NOT NEED TO REPEAT CHEST XRAY 07-30-16 DAY OF PROCEDURE PER DR Linna Caprice.

## 2016-07-28 NOTE — Progress Notes (Signed)
  SPOKE WITH JOEY DEAKINS [employer]ASTON SCIENTIFIC REP AND CONFIRMED PROCEDURE DATE AND TIME 07-2016 1130 TO 1215 AND NOTE PLACED ON ENDO SCHEDULE TO CALL JOEY ON CELL [phone] WHEN READY FOR POST OP INTERROGATION OF ICD.

## 2016-07-29 ENCOUNTER — Telehealth: Payer: Self-pay | Admitting: Gastroenterology

## 2016-07-29 ENCOUNTER — Ambulatory Visit (HOSPITAL_COMMUNITY): Payer: PRIVATE HEALTH INSURANCE | Admitting: Anesthesiology

## 2016-07-29 ENCOUNTER — Ambulatory Visit (INDEPENDENT_AMBULATORY_CARE_PROVIDER_SITE_OTHER): Payer: PRIVATE HEALTH INSURANCE | Admitting: Pharmacist

## 2016-07-29 VITALS — Wt 188.0 lb

## 2016-07-29 DIAGNOSIS — E785 Hyperlipidemia, unspecified: Secondary | ICD-10-CM | POA: Diagnosis not present

## 2016-07-29 MED ORDER — SILDENAFIL CITRATE 100 MG PO TABS: 100.0000 mg | ORAL_TABLET | ORAL | 6 refills | Status: AC | PRN

## 2016-07-29 MED ORDER — ROSUVASTATIN CALCIUM 20 MG PO TABS: 20.0000 mg | ORAL_TABLET | Freq: Every day | ORAL | 11 refills | Status: DC

## 2016-07-29 NOTE — Progress Notes (Signed)
Patient ID: Roger Martinez                 DOB: 1966-01-10                    MRN: [id number]     HPI: Roger Martinez is a 51 y.o. male patient of Dr Roger Martinez referred to lipid clinic by Roger Range, PA. PMH is significant for NSTEMI in 2011, CAD s/p previous PCI and CABG x3V in 2011, HFrEF and ICM with LVEF 25-30% s/p ICD, PAD, HTN, ongoing tobacco abuse, and medical non compliance. He presents today for follow up with his sister.  Pt reports previously trying multiple statins but does not recall experiencing any myalgias. He remembers experiencing grogginess and mental fogginess when he took pravastatin. He reports that he was changed around to different statins by different doctors. He walks a few days a week but is limited by PAD pain after about 2 miles.  Current Medications: none Intolerances: pravastatin 19m daily, simvastatin 418mdaily, simvastatin 2066mvery third day, lovastatin 52m54mily Risk Factors: NSTEMI, CAD s/p PCI and CABG LDL goal: <70mg9m Diet: Eats eggs with cheese for breakfast 3 days a week with toast and OJ. Drinks water. Does eat fried food 4 days per week, pasta once a week. Fruits, veggies and greens once a week.   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  Family History: The patient's family history includes Alzheimer's disease in his father; Diabetes in his mother; Gallbladder disease in his sister; Heart disease in his mother; Hypertension in his mother; Other in his brother.    Social History: Still smoking 6 cigarettes per day. Occasionally drinks alcohol. Still smoking marijuana.  Labs: 12/17/15: TC 175, TG 80, HDL 59, LDL 100 (no therapy)  Past Medical History:  Diagnosis Date  . Anxiety state, unspecified   . Blood in stool 7-8 months ago  . Chronic combined systolic (congestive) and diastolic (congestive) heart failure (HCC) [place]. 03/2014 Echo: EF 20-25%, diff HK, Gr 2 DD, mild AI, sev dil  LA, mod reduced RV fxn, PASP 59mmH22m. CoroMarland Kitchenary artery disease    a. 2011 s/p CABG x 3.  . GERD (gastroesophageal reflux disease)    takes protonix q3 days as of 12/2015  . Hyperlipidemia   . Hypertension   . Hypertensive heart disease with heart failure (HCC)  [place]    a. 03/2014 Echo: EF 20-25%;  b. 06/2014 s/p BSX single lead AICD, ser # [id number] abuse   . MI (myocardial infarction) (HCC) [place]  . Noncompliance   . Perirectal abscess    once prior as of 12/2015  . Sciatica   . STD (sexually transmitted disease)   . Tobacco abuse   . Wears glasses     Current Outpatient Prescriptions on File Prior to Visit  Medication Sig Dispense Refill  . amLODipine (NORVASC) 10 MG tablet Take 1 tablet (10 mg total) by mouth daily. 30 tablet 0  . aspirin EC 81 MG tablet Take 1 tablet (81 mg total) by mouth daily. 90 tablet 3  . carvedilol (COREG) 25 MG tablet Take 1 tablet (25 mg total) by mouth 2 (two) times daily with a meal. (Patient taking differently: Take 25 mg by mouth 2 (two) times daily. ) 60 tablet 3  . Docusate Calcium (STOOL SOFTENER PO) Take 1 tablet by mouth  daily as needed (constipation).    . furosemide (LASIX) 40 MG tablet Take 1 tablet (40 mg total) by mouth daily. 30 tablet 3  . lisinopril (PRINIVIL,ZESTRIL) 20 MG tablet Take 1 tablet (20 mg total) by mouth daily. 30 tablet 3  . Na Sulfate-K Sulfate-Mg Sulf (SUPREP BOWEL PREP KIT) 17.5-3.13-1.6 GM/180ML SOLN Use as directed 2 Bottle 0  . NITROSTAT 0.4 MG SL tablet Place 1 tablet (0.4 mg total) under the tongue every 5 (five) minutes as needed for chest pain. Please call office. 25 tablet 3  . pantoprazole (PROTONIX) 40 MG tablet Take 1 tablet (40 mg total) by mouth daily. 90 tablet 1  . potassium chloride SA (K-DUR,KLOR-CON) 20 MEQ tablet Take 1 tablet (20 mEq total) by mouth daily. 30 tablet 3  . sildenafil (VIAGRA) 100 MG tablet Take 1 tablet (100 mg total) by mouth as needed for erectile  dysfunction. 20 tablet 6   No current facility-administered medications on file prior to visit.     No Known Allergies  Assessment/Plan:  1. Hyperlipidemia - LDL is currently 163m/dL above goal < 745mdL given hx of ASCVD. Pt has previously taken multiple statins but only recalls experiencing mental fogginess with pravastatin. He is willing to start Crestor 203maily. Will recheck lipids and LFTs in 2 months. Pt advised to call clinic with any concerns.   Yashas Camilli E. Supple, PharmD, CPP, [id number] [id number]: [phone]: [phone]/2018 10:45 AM

## 2016-07-29 NOTE — Patient Instructions (Addendum)
Start taking Crestor (rosuvastatin) 20mg  once a day for your cholesterol.  Try to cut back on fried food - start grilling or baking your food instead.  Recheck your cholesterol in 2 months. Come in any time after 7:30am on Monday, June 25th for fasting blood work.  Call Megan in the cholesterol clinic with any questions [phone]

## 2016-07-29 NOTE — Telephone Encounter (Signed)
The pt was confused about his instructions, we discussed at length what he is suppose to do. Pt verbalized understanding and will call back with any further questions.  The pt was also resent the instructions to My Chart

## 2016-07-30 ENCOUNTER — Ambulatory Visit (HOSPITAL_COMMUNITY)
Admission: RE | Admit: 2016-07-30 | Discharge: 2016-07-30 | Disposition: A | Discharge status: Home / Self Care | Payer: PRIVATE HEALTH INSURANCE | Source: Ambulatory Visit | Attending: Gastroenterology | Admitting: Gastroenterology

## 2016-07-30 ENCOUNTER — Encounter (HOSPITAL_COMMUNITY)
Admission: RE | Disposition: A | Discharge status: Home / Self Care | Payer: Self-pay | Source: Ambulatory Visit | Attending: Gastroenterology

## 2016-07-30 ENCOUNTER — Telehealth: Payer: Self-pay | Admitting: Pharmacist

## 2016-07-30 ENCOUNTER — Encounter (HOSPITAL_COMMUNITY): Payer: Self-pay | Admitting: Gastroenterology

## 2016-07-30 HISTORY — DX: Melena: K92.1

## 2016-07-30 SURGERY — CANCELLED PROCEDURE
Anesthesia: Monitor Anesthesia Care

## 2016-07-30 MED ORDER — PROPOFOL 10 MG/ML IV BOLUS
INTRAVENOUS | Status: AC
  Filled 2016-07-30: qty 40

## 2016-07-30 MED ORDER — LIDOCAINE 2% (20 MG/ML) 5 ML SYRINGE
INTRAMUSCULAR | Status: AC
  Filled 2016-07-30: qty 5

## 2016-07-30 SURGICAL SUPPLY — 21 items

## 2016-07-30 NOTE — H&P (View-Only) (Signed)
07/07/2016 Roger Martinez [id number] 09/06/1965   HISTORY OF PRESENT ILLNESS:  This is a 51 year old male who is new to our practice.  He presents to our office today to discuss colonoscopy.  He's never had one in the past.  He says that he had been experiencing some constipation and had seen some small amounts of bright red blood on a few occassions when he would be straining or have a hard stool.  He says that he is sure the constipation was from his poor diet and he has now been eating better.  Eating oatmeal every day for breakfast and has been moving his bowels just fine, "like clockwork".  He has not seen any further blood.  No other complaints at this time.  He has history of CAD with CABG x 3 in 2011, HTN, HLD, combined systolic and diastolic CHF with EF of 14-48%.  Has an ICD as well.  Referred here by Chana Bode, PA-C.  Past Medical History:  Diagnosis Date  . Anxiety state, unspecified   . Chronic combined systolic (congestive) and diastolic (congestive) heart failure    a. 03/2014 Echo: EF 20-25%, diff HK, Gr 2 DD, mild AI, sev dil LA, mod reduced RV fxn, PASP 75mmHg.  Marland Kitchen Coronary artery disease    a. 2011 s/p CABG x 3.  . GERD (gastroesophageal reflux disease)    takes protonix q3 days as of 12/2015  . Hyperlipidemia   . Hypertension   . Hypertensive heart disease with heart failure ([place])   . Ischemic cardiomyopathy    a. 03/2014 Echo: EF 20-25%;  b. 06/2014 s/p BSX single lead AICD, ser # [id number].  . Marijuana abuse   . MI (myocardial infarction) 03/2011  . Noncompliance   . Perirectal abscess    once prior as of 12/2015  . Sciatica   . STD (sexually transmitted disease)   . Tobacco abuse   . Wears glasses    Past Surgical History:  Procedure Laterality Date  . BYPASS GRAFT  2011   3 vessel  . EP IMPLANTABLE DEVICE  06/17/2014   DR [name]  . FOOT SURGERY     trauma repair, 51yo, lawnmower incident  . IMPLANTABLE CARDIOVERTER DEFIBRILLATOR IMPLANT N/A  06/17/2014   Procedure: IMPLANTABLE CARDIOVERTER DEFIBRILLATOR IMPLANT;  Surgeon: Deboraha Sprang, MD;  Location[facility] CATH LAB;  Service: Cardiovascular;  Laterality: N/A;  . INCISION AND DRAINAGE PERIRECTAL ABSCESS N/A 06/13/2015   Procedure: IRRIGATION AND DEBRIDEMENT PERIRECTAL ABSCESS;  Surgeon: Michael Boston, MD;  Location: [facility] ORS;  Service: General;  Laterality: N/A;    reports that he has been smoking.  He has a 11.50 pack-year smoking history. He has never used smokeless tobacco. He reports that he drinks about 0.6 oz of alcohol per week . He reports that he uses drugs, including Marijuana. family history includes Alzheimer's disease in his father; Diabetes in his mother; Gallbladder disease in his sister; Heart disease in his mother; Hypertension in his mother; Other in his brother. No Known Allergies    Outpatient Encounter Prescriptions as of 07/07/2016  Medication Sig  . amLODipine (NORVASC) 10 MG tablet Take 1 tablet (10 mg total) by mouth daily.  Marland Kitchen aspirin EC 81 MG tablet Take 1 tablet (81 mg total) by mouth daily.  . carvedilol (COREG) 25 MG tablet Take 1 tablet (25 mg total) by mouth 2 (two) times daily with a meal.  . furosemide (LASIX) 40 MG tablet Take 1 tablet (40 mg total)  by mouth daily.  Marland Kitchen lisinopril (PRINIVIL,ZESTRIL) 20 MG tablet Take 1 tablet (20 mg total) by mouth daily.  Marland Kitchen NITROSTAT 0.4 MG SL tablet Place 1 tablet (0.4 mg total) under the tongue every 5 (five) minutes as needed for chest pain. Please call office.  . pantoprazole (PROTONIX) 40 MG tablet Take 1 tablet (40 mg total) by mouth daily.  . potassium chloride SA (K-DUR,KLOR-CON) 20 MEQ tablet Take 1 tablet (20 mEq total) by mouth daily.  . sildenafil (VIAGRA) 100 MG tablet Take 1 tablet (100 mg total) by mouth as needed for erectile dysfunction.   No facility-administered encounter medications on file as of 07/07/2016.      REVIEW OF SYSTEMS  : All other systems reviewed and negative except where noted in the  History of Present Illness.   PHYSICAL EXAM: BP 130/90   Pulse 64   Ht 5\' 11"  (1.803 m)   Wt 183 lb 2 oz (83.1 kg)   BMI 25.54 kg/m  General: Well developed black male in no acute distress Head: Normocephalic and atraumatic Eyes:  Sclerae anicteric, conjunctiva pink. Ears: Normal auditory acuity Lungs: Clear throughout to auscultation Heart: Regular rate and rhythm Abdomen: Soft, non-distended.  Normal bowel sounds.  Non-tender. Rectal:  Will be done at the time of colonoscopy. Musculoskeletal: Symmetrical with no gross deformities  Skin: No lesions on visible extremities Extremities: No edema  Neurological: Alert oriented x 4, grossly non-focal Psychological:  Alert and cooperative. Normal mood and affect  ASSESSMENT AND PLAN: -Screening colonoscopy:  Never had one in the past.  Will schedule with Dr. Silverio Decamp at [facility] due to EF of 30-35%.  The risks, benefits, and alternatives to colonoscopy were discussed with the patient and he consents to proceed.  -Rectal bleeding:  Small amounts of bright red blood when he was constipated.  Now moving his bowels well so is no longer seeing blood.   CC:  Tysinger, Camelia Eng, PA-C

## 2016-07-30 NOTE — Interval H&P Note (Signed)
History and Physical Interval Note:  07/30/2016 11:11 AM   Patient left from pre procedure area before I spoke to him. He informed RN that he will reschedule. Unclear if he completed the bowel prep.   Damaris Hippo , MD [phone] Mon-Fri 8a-5p [phone] after 5p, weekends, holidays

## 2016-07-30 NOTE — Anesthesia Preprocedure Evaluation (Deleted)
Anesthesia Evaluation  Patient identified by MRN, date of birth, ID band Patient awake    Reviewed: Allergy & Precautions, NPO status , Patient's Chart, lab work & pertinent test results  Airway Mallampati: II  TM Distance: >3 FB Neck ROM: Full    Dental no notable dental hx.    Pulmonary Current Smoker,    Pulmonary exam normal breath sounds clear to auscultation       Cardiovascular hypertension, + CAD, + Past MI, + CABG and +CHF  + Cardiac Defibrillator  Rhythm:Regular Rate:Normal + Systolic murmurs 39/7673 Echo: EF 20-25%, diff HK, Gr 2 DD, mild AI, sev dil LA, mod reduced RV fxn, PASP 56mHg.   Neuro/Psych negative neurological ROS  negative psych ROS   GI/Hepatic negative GI ROS, Neg liver ROS,   Endo/Other  negative endocrine ROS  Renal/GU negative Renal ROS  negative genitourinary   Musculoskeletal negative musculoskeletal ROS (+)   Abdominal   Peds negative pediatric ROS (+)  Hematology negative hematology ROS (+)   Anesthesia Other Findings   Reproductive/Obstetrics negative OB ROS                             Anesthesia Physical Anesthesia Plan  ASA: IV  Anesthesia Plan: MAC   Post-op Pain Management:    Induction: Intravenous  Airway Management Planned: Nasal Cannula  Additional Equipment:   Intra-op Plan:   Post-operative Plan:   Informed Consent: I have reviewed the patients History and Physical, chart, labs and discussed the procedure including the risks, benefits and alternatives for the proposed anesthesia with the patient or authorized representative who has indicated his/her understanding and acceptance.   Dental advisory given  Plan Discussed with: CRNA and Surgeon  Anesthesia Plan Comments:         Anesthesia Quick Evaluation

## 2016-07-30 NOTE — Interval H&P Note (Signed)
History and Physical Interval Note:  07/30/2016 10:41 AM  Roger Martinez  has presented today for surgery, with the diagnosis of screening  The various methods of treatment have been discussed with the patient and family. After consideration of risks, benefits and other options for treatment, the patient has consented to  Procedure(s): COLONOSCOPY WITH PROPOFOL (N/A) as a surgical intervention .  The patient's history has been reviewed, patient examined, no change in status, stable for surgery.  I have reviewed the patient's chart and labs.  Questions were answered to the patient's satisfaction.     Wyona Neils

## 2016-07-30 NOTE — Telephone Encounter (Signed)
Pt called irate about his Viagra prescription. He reports that his pharmacy will only fill for 6 tablets per our rx. We sent rx for 20 tablets to his local [facility] and this was confirmed. He states that it is our fault he can only get 6 tablets. I advised that rx was sent for 20 tablets with 6 refills. I offered to call insurance to confirm if there was a qty limit. He seemed to calm down some with this.   I called insurance and there is a QTY limit of 6 tablets per month. Spoke with Dwain Sarna and she informed me that pt had also been informed of this several days ago and pt had requested a review of the qty limit (which is still under review). Advised I would call him back and let him know.   Spoke to patient and advised of above.

## 2016-08-02 ENCOUNTER — Telehealth: Payer: Self-pay | Admitting: *Deleted

## 2016-08-02 NOTE — Telephone Encounter (Signed)
called about Viagra QTY LIMIT, he thought that we controlled this, i reread the ph note from 07/30/16 & explained he will have to call his insurance company about the QTY LIMIT as we cant make the insurance change their rules, pt in agreement with this.

## 2016-08-03 ENCOUNTER — Telehealth: Payer: Self-pay | Admitting: Pharmacist

## 2016-08-03 ENCOUNTER — Encounter: Payer: Self-pay | Admitting: Internal Medicine

## 2016-08-03 ENCOUNTER — Telehealth: Payer: Self-pay

## 2016-08-03 ENCOUNTER — Telehealth: Payer: Self-pay | Admitting: *Deleted

## 2016-08-03 DIAGNOSIS — N528 Other male erectile dysfunction: Secondary | ICD-10-CM

## 2016-08-03 NOTE — Telephone Encounter (Signed)
See phone note from today with Eino Farber, [facility].

## 2016-08-03 NOTE — Telephone Encounter (Signed)
**Note De-Identified Treasure Ochs Obfuscation** I have completed quantity exception form for Viagra and fax back to [employer]. Awaiting response.

## 2016-08-03 NOTE — Telephone Encounter (Signed)
PT called again irate about his Viagra prescription. He screamed at me again that this is our practice's fault. Again advised him of my conversation with his insurance on Friday. Per Roger Martinez there is no approval process for the QTY limit except what has already been filed by pt. Even if there was I am doubtful that would be approved as insurance does not view viagra as a medically necessary medication in a case such as this. Again advised pt that he can fill 6 through insurance and cash pay for remain 14 on monthly prescription. He began yelling at me and using profanity and ultimately hung up.

## 2016-08-03 NOTE — Telephone Encounter (Signed)
Patient left a msg on the refill vm stating that [employer] [facility] faxed a form to the office in regards to the viagra being approved for #20 and he spoke with someone from our office and was informed that it had already been faxed back to them. He just got off of the phone with [facility] and they stated that they never received it. The fax number for [facility] [facility] is [phone] and the phone number [phone]. His member id number is [id number]. Thanks, MI

## 2016-08-03 NOTE — Telephone Encounter (Signed)
08/03/2016 1655 Pt has called in today mulitple times using a loud voice requesting assistance with increasing the amount of viagra per refill his insurance will cover.  Pt raises his voice, often his speech is not intelligible due to the volume.  He is requesting completion of a quantity limitation review form for his insurance to reconsider covering more than 6 pills/month.  MD prescription is for 20 tabs/month.  Call to [employer] to request the quantity review form.  Form received, will complete as soon as possible and return to [facility] for consideration.  Pt is aware of plan.  Georgana Curio MHA RN CCM

## 2016-08-03 NOTE — Telephone Encounter (Signed)
**Note De-Identified Roger Martinez Obfuscation** I spoke with the pt concerning his Viagra. He complains that he has been calling this office for days and no one will help him. I told him that I am the prior auth nurse and that I will help him anyway I can.  He states that he is only getting 6 tablets of Viagra and he wants 20. I advised him that we sent in a prescription for 20 tablets to his pharmacy and that his insurance will only pay for 6 tablets a month. He stated that he was told on 4/16 that we faxed a form to his insurance company and that he has spoken with his insurance company and was advised that we never faxed a form to them.  I explained to him that (according to a phone note in his chart from 4/20 with our pharmacist Claiborne Billings) Claiborne Billings called his insurance company and was advised that the pt started the process for a quantity exception himself.  He then became angry and then tearful asking me to hold on while he pulls himself together.  He came back to the phone yelling and cussing that he is a patient and somebody better listen to him. I asked him to stop cussing and yelling because I cannot understand him. He continued so I hung up the phone.  He called back. When I answered the phone he was screaming and yelling again so I hung up and went and spoke with Con Memos, the office Publishing copy.  While I was in Teri's office another staff member, Mindy, came in and advised [place] that the pt was calling non stop.  Con Memos came back to the refills dept with me. The pt called back and Con Memos attempted to talk to him but he was yelling so loud I could hear him from my chair. She attempted several times to calm him down but the pt continued to yell then hung up on her.

## 2016-08-06 ENCOUNTER — Telehealth: Payer: Self-pay

## 2016-08-06 NOTE — Telephone Encounter (Signed)
Left message to call back  

## 2016-08-06 NOTE — Telephone Encounter (Signed)
-----   Message from Mauri Pole, MD sent at 07/30/2016  1:29 PM EDT ----- Can you please call patient and check what happened. He left from endo unit this morning before I saw him  Thanks VN  Please copy this conversation to patient chart

## 2016-08-09 NOTE — Telephone Encounter (Signed)
Pt is calling back requesting information concerning his medication Viagra, Roger Hawking, RN spoke with pt about this matter. Please advise

## 2016-08-10 NOTE — Telephone Encounter (Signed)
**Note De-Identified Korine Winton Obfuscation** The pt is advised that the quantity exception for Sildenafil has been denied through [facility]. He is aware that I have done an appeal and that I will call him as soon as that determination has been made.

## 2016-08-13 ENCOUNTER — Telehealth: Payer: Self-pay | Admitting: Medical

## 2016-08-13 ENCOUNTER — Other Ambulatory Visit: Payer: Self-pay

## 2016-08-13 MED ORDER — AMLODIPINE BESYLATE 10 MG PO TABS: 10.0000 mg | ORAL_TABLET | Freq: Every day | ORAL | 0 refills | Status: DC

## 2016-08-13 MED ORDER — FUROSEMIDE 40 MG PO TABS: 40.0000 mg | ORAL_TABLET | Freq: Every day | ORAL | 3 refills | Status: DC

## 2016-08-13 NOTE — Telephone Encounter (Signed)
Sent in refill to pharmacy.

## 2016-08-13 NOTE — Telephone Encounter (Signed)
Requesting refills on Amlodipine 10 mg & Furosemide 40 mg. Pt is out of Amlodipine and only has #4 of Furosemide

## 2016-08-18 NOTE — Telephone Encounter (Signed)
Quantity exception appeal for Viagra has been denied through [employer]. Per fax the pt will ne notified by letter from [facility].

## 2016-08-23 NOTE — Telephone Encounter (Signed)
The pt called the office today upset that his insurance company has denied his quantity exception. He is asking what we are going to do about it. I advised him that at this point there is nothing else the office can do as this is an issue with the plan that he chose through his insurance company.  Also, I advised him that I have discussed this matter with Dr [name] and that per Dr [name] we can offer to send his Viagra RX to [facility] drug store to fill as they offer Viagra at a lower cost or we can refer him to a Urologist.  He states that he is interested in being referred to a Urologist and that he will call me back tomorrow with the name of a Urologist he would like to see.

## 2016-08-25 NOTE — Telephone Encounter (Addendum)
The pt called back and stated that he wants to be referred to Dr Juliane Lack Junious Silk (Urologist) for his ED. I have placed an order for the Urology referral and the pt is aware that someone from this office or the Urology office will be contacting him. He verbalized understanding.  Will forward message to Dr [name] and his nurse as Juluis Rainier.

## 2016-08-25 NOTE — Addendum Note (Signed)
**Note De-Identified Jule Whitsel Obfuscation** Addended by: Dennie Fetters on: 08/25/2016 02:28 PM   Modules accepted: Orders

## 2016-08-27 ENCOUNTER — Telehealth: Payer: Self-pay | Admitting: *Deleted

## 2016-08-27 DIAGNOSIS — N529 Male erectile dysfunction, unspecified: Secondary | ICD-10-CM

## 2016-08-27 NOTE — Telephone Encounter (Signed)
ASKING ABOUT THE REFERRAL FOR HIS ED MEDICATION, PLEASE CALL HIM BACK, THANKS

## 2016-08-30 ENCOUNTER — Telehealth: Payer: Self-pay | Admitting: Gastroenterology

## 2016-08-31 NOTE — Telephone Encounter (Signed)
I have placed a second referral to Dr Junious Silk through the [facility] system. Also, I have faxed the [facility] Outpatient Referral Request Form to Dr Junious Silk at [phone] as requested by the pt.

## 2016-08-31 NOTE — Telephone Encounter (Signed)
The pt states that he was contacted this morning by the Urology office. He states that he has no other concerns at this time but will call me back if he does.

## 2016-08-31 NOTE — Telephone Encounter (Signed)
Spoke with the patient. He is experiencing rectal blood. Notes unintended weight loss of about 6 lbs. Unclear what happened on his procedure date. He does not recall receiving any messages afterwards. Appointment scheduled to be re-evaluated. No hospital procedure dates available.

## 2016-08-31 NOTE — Telephone Encounter (Signed)
Follow Up:   Pt said he called Dr Junious Silk office and they need his  referral faxed to them They are not in [facility] fax to [phone].

## 2016-09-01 NOTE — Progress Notes (Signed)
Cardiology Office Note Date:  09/02/2016  Patient ID:  Roger Martinez, DOB [birth year], MRN [id number] PCP:  Carlena Hurl, PA-C  Cardiologist:  Dr. [name] Electrophysiologist: Dr. [name] (previously Dr. Lovena Le, pt prefers Dr. [name] since he put his device in)    Chief Complaint: ICD check  History of Present Illness: Roger Martinez is a 51 y.o. male with history of CAD (PCI >>CABG 2011), ICM w/ICD, chronic CHF (systolic), HTN, hypertensive heart disease, severe LVH, during hospital stay notes mention consider MRI to r/o amyloid, at his last general cardiology visit, was felt this wouldl not change management and would not pursue,  ,  smoking, PAD follows with vascular, Dr. Trula Slade currently treated with medical therapy/walking.  He was hospitalized Feb 2018 with malignant HTN and acute CHF exacerbation, since then reported medication compliance.  He comes in today for a device check, to be seen for Dr. [name], last seen by him in Aug 2016, at that time for post implant visit.  He has been following with gen cardiology team all along and doing his remote device checks.Marland Kitchen  He is feeling well, denies any kind of CP, palpitations or SOB, he walks 2 miles 5 days a week, this is the distance he walks before his calves start to tighten up which is his limiting factor.  He c/w vascular and walking regime remains the POC for his PVD.  He denies any near syncope or syncope.  He has not been shocked by his device.  He is pending a GI visit tomorrow to discuss colonoscopy, this has him a little anxious, his BP is high today, recheck is 178/82, though he left the house in a hurry and doesn't like to take his medicines without eating, will take them when he gets home after here.  He reporrts he is taking his medicines as directed and actively working on quitting smoking.  Review of records state that he is intolerant to spiro, entresto and hydralazine. He takes PDE-5 inhibitors and cannot take  nitrates   Device information: BSci single lead ICD implanted 06/17/14, Dr. [name] RV lead threshold appears chronically elevated   Past Medical History:  Diagnosis Date  . Anxiety state, unspecified   . Blood in stool 7-8 months ago  . Chronic combined systolic (congestive) and diastolic (congestive) heart failure ([place])    a. 03/2014 Echo: EF 20-25%, diff HK, Gr 2 DD, mild AI, sev dil LA, mod reduced RV fxn, PASP 38mmHg.  Marland Kitchen Coronary artery disease    a. 2011 s/p CABG x 3.  . GERD (gastroesophageal reflux disease)    takes protonix q3 days as of 12/2015  . Hyperlipidemia   . Hypertension   . Hypertensive heart disease with heart failure ([place])   . Ischemic cardiomyopathy    a. 03/2014 Echo: EF 20-25%;  b. 06/2014 s/p BSX single lead AICD, ser # [id number].  . Marijuana abuse   . MI (myocardial infarction) ([place]) 03/2011  . Noncompliance   . Perirectal abscess    once prior as of 12/2015  . Sciatica   . STD (sexually transmitted disease)   . Tobacco abuse   . Wears glasses     Past Surgical History:  Procedure Laterality Date  . BYPASS GRAFT  2011   3 vessel  . EP IMPLANTABLE DEVICE  06/17/2014   DR [name]  . FOOT SURGERY     trauma repair, 51yo, lawnmower incident left big toe  . IMPLANTABLE CARDIOVERTER DEFIBRILLATOR IMPLANT N/A 06/17/2014   Procedure:  IMPLANTABLE CARDIOVERTER DEFIBRILLATOR IMPLANT;  Surgeon: Deboraha Sprang, MD;  Location: [facility] CATH LAB;  Service: Cardiovascular;  Laterality: N/A;  . INCISION AND DRAINAGE PERIRECTAL ABSCESS N/A 06/13/2015   Procedure: IRRIGATION AND DEBRIDEMENT PERIRECTAL ABSCESS;  Surgeon: Michael Boston, MD;  Location: [facility] ORS;  Service: General;  Laterality: N/A;    Current Outpatient Prescriptions  Medication Sig Dispense Refill  . amLODipine (NORVASC) 10 MG tablet Take 1 tablet (10 mg total) by mouth daily. 30 tablet 0  . aspirin EC 81 MG tablet Take 1 tablet (81 mg total) by mouth daily. 90 tablet 3  . carvedilol (COREG) 25 MG tablet Take 1  tablet (25 mg total) by mouth 2 (two) times daily with a meal. (Patient taking differently: Take 25 mg by mouth 2 (two) times daily. ) 60 tablet 3  . Docusate Calcium (STOOL SOFTENER PO) Take 1 tablet by mouth daily as needed (constipation).    . furosemide (LASIX) 40 MG tablet Take 1 tablet (40 mg total) by mouth daily. 30 tablet 3  . lisinopril (PRINIVIL,ZESTRIL) 20 MG tablet Take 1 tablet (20 mg total) by mouth daily. 30 tablet 3  . NITROSTAT 0.4 MG SL tablet Place 1 tablet (0.4 mg total) under the tongue every 5 (five) minutes as needed for chest pain. Please call office. 25 tablet 3  . pantoprazole (PROTONIX) 40 MG tablet Take 1 tablet (40 mg total) by mouth daily. 90 tablet 1  . potassium chloride SA (K-DUR,KLOR-CON) 20 MEQ tablet Take 1 tablet (20 mEq total) by mouth daily. 30 tablet 3  . rosuvastatin (CRESTOR) 20 MG tablet Take 1 tablet (20 mg total) by mouth daily. 30 tablet 11  . sildenafil (VIAGRA) 100 MG tablet Take 1 tablet (100 mg total) by mouth as needed for erectile dysfunction. 20 tablet 6   No current facility-administered medications for this visit.     Allergies:   Patient has no known allergies.   Social History:  The patient  reports that he has been smoking.  He has a 11.50 pack-year smoking history. He has never used smokeless tobacco. He reports that he drinks about 0.6 oz of alcohol per week . He reports that he uses drugs, including Marijuana.   Family History:  The patient's family history includes Alzheimer's disease in his father; Diabetes in his mother; Gallbladder disease in his sister; Heart disease in his mother; Hypertension in his mother; Other in his brother.  ROS:  Please see the history of present illness.    All other systems are reviewed and otherwise negative.   PHYSICAL EXAM:  VS:  BP (!) 180/98   Pulse (!) 56   Ht 5\' 11"  (1.803 m)   Wt 181 lb (82.1 kg)   BMI 25.24 kg/m  BMI: Body mass index is 25.24 kg/m. Well nourished, well developed, in no  acute distress  HEENT: normocephalic, atraumatic  Neck: no JVD, carotid bruits or masses Cardiac:  RRR; no significant murmurs, no rubs, or gallops Lungs: CTA b/l, no wheezing, rhonchi or rales  Abd: soft, nontender MS: no deformity or atrophy Ext:  no edema, warm b/l  Skin: warm and dry, no rash Neuro:  No gross deficits appreciated Psych: euthymic mood, full affect  ICD site is stable, no tethering or discomfort   EKG:  Done 07/16/16 SR, diffuse T changes ICD interrogation done today by industry and reviewed by myself: battery and lead measurements are stable, no VT, <1% V pacing  06/06/16: TTE Study Conclusions - Left ventricle:  Diffuse hypokinesis with inferior akinesis at mid   and basal level. The cavity size was moderately dilated. Wall   thickness was increased in a pattern of severe LVH. Systolic   function was moderately to severely reduced. The estimated   ejection fraction was in the range of 30% to 35%. Doppler   parameters are consistent with both elevated ventricular   end-diastolic filling pressure and elevated left atrial filling   pressure. - Aortic valve: There was mild regurgitation. Valve area (VTI):   2.93 cm^2. Valve area (Vmax): 2.66 cm^2. Valve area (Vmean): 2.67   cm^2. - Mitral valve: There was mild regurgitation. - Left atrium: The atrium was severely dilated. - Pulmonary arteries: PA peak pressure: 35 mm Hg (S). - Impressions: Severe LVH consider MRI to r/o amyloid or non   obstructive HOCM if clinically indicated. Impressions: - Severe LVH consider MRI to r/o amyloid or non obstructive HOCM if   clinically indicated.    Recent Labs: 06/05/2016: B Natriuretic Peptide 1,422.2 06/06/2016: ALT 11 06/07/2016: Hemoglobin 15.6; Magnesium 2.2; Platelets 244 07/16/2016: BUN 14; Creatinine, Ser 1.00; Potassium 4.3; Sodium 143  12/17/2015: Cholesterol 175; HDL 59; LDL Cholesterol 100; Total CHOL/HDL Ratio 3.0; Triglycerides 80; VLDL 16   CrCl cannot be  calculated (Patient's most recent lab result is older than the maximum 21 days allowed.).   Wt Readings from Last 3 Encounters:  09/02/16 181 lb (82.1 kg)  07/29/16 188 lb (85.3 kg)  07/16/16 192 lb 6.4 oz (87.3 kg)     Other studies reviewed: Additional studies/records reviewed today include: summarized above  ASSESSMENT AND PLAN:  1. ICD     RV lead threshold looks unchanged from his initial post implant visit, <1% V pacing  2. Hypertensive heart disease, severe LVH     BP elevated this AM, recheck a little better, hasn't taken his meds today yet, will when he gets home, he understands the importance, and reports he has been good with his medicines  3. Chronic CHF (systolic/diastolic)     ICM     Exam is euvolemic     On BB/ACE, diuretic  4. CAD     No anginal copmplaints     On ASA, BB, statin     C/w Dr. [name]   Disposition: F/u with remote checks q3 months, Dr. [name] in 1 year, sooner if needed  Current medicines are reviewed at length with the patient today.  The patient did not have any concerns regarding medicines.  Roger Lasso, PA-C 09/02/2016 8:36 AM     [address] [place] [address] [phone] (office)  [phone] (fax)

## 2016-09-02 ENCOUNTER — Ambulatory Visit (INDEPENDENT_AMBULATORY_CARE_PROVIDER_SITE_OTHER): Payer: PRIVATE HEALTH INSURANCE | Admitting: Physician Assistant

## 2016-09-02 VITALS — BP 180/98 | HR 56 | Ht 71.0 in | Wt 181.0 lb

## 2016-09-02 DIAGNOSIS — Z95810 Presence of automatic (implantable) cardiac defibrillator: Secondary | ICD-10-CM | POA: Diagnosis not present

## 2016-09-02 DIAGNOSIS — I5022 Chronic systolic (congestive) heart failure: Secondary | ICD-10-CM | POA: Diagnosis not present

## 2016-09-02 DIAGNOSIS — I2510 Atherosclerotic heart disease of native coronary artery without angina pectoris: Secondary | ICD-10-CM | POA: Diagnosis not present

## 2016-09-02 DIAGNOSIS — I110 Hypertensive heart disease with heart failure: Secondary | ICD-10-CM

## 2016-09-02 DIAGNOSIS — I5043 Acute on chronic combined systolic (congestive) and diastolic (congestive) heart failure: Secondary | ICD-10-CM

## 2016-09-02 NOTE — Patient Instructions (Signed)
Medication Instructions:   Your physician recommends that you continue on your current medications as directed. Please refer to the Current Medication list given to you today.  If you need a refill on your cardiac medications before your next appointment, please call your pharmacy.  Labwork: NONE ORDERED  TODAY    Testing/Procedures: NONE ORDERED  TODAY    Follow-Up:  Your physician wants you to follow-up in: [place] will receive a reminder letter in the mail two months in advance. If you don't receive a letter, please call our office to schedule the follow-up appointment.  Remote monitoring is used to monitor your Pacemaker of ICD from home. This monitoring reduces the number of office visits required to check your device to one time per year. It allows [place] to keep an eye on the functioning of your device to ensure it is working properly. You are scheduled for a device check from home on . 8-27*18 You may send your transmission at any time that day. If you have a wireless device, the transmission will be sent automatically. After your physician reviews your transmission, you will receive a postcard with your next transmission date.     Any Other Special Instructions Will Be Listed Below (If Applicable).

## 2016-09-03 ENCOUNTER — Ambulatory Visit (INDEPENDENT_AMBULATORY_CARE_PROVIDER_SITE_OTHER): Payer: PRIVATE HEALTH INSURANCE | Admitting: Physician Assistant

## 2016-09-03 ENCOUNTER — Encounter: Payer: Self-pay | Admitting: Physician Assistant

## 2016-09-03 ENCOUNTER — Other Ambulatory Visit: Payer: Self-pay

## 2016-09-03 ENCOUNTER — Other Ambulatory Visit (INDEPENDENT_AMBULATORY_CARE_PROVIDER_SITE_OTHER): Payer: PRIVATE HEALTH INSURANCE

## 2016-09-03 ENCOUNTER — Telehealth: Payer: Self-pay | Admitting: Medical

## 2016-09-03 VITALS — BP 148/78 | HR 64 | Ht 71.0 in | Wt 184.0 lb

## 2016-09-03 DIAGNOSIS — R6881 Early satiety: Secondary | ICD-10-CM

## 2016-09-03 DIAGNOSIS — R634 Abnormal weight loss: Secondary | ICD-10-CM | POA: Diagnosis not present

## 2016-09-03 DIAGNOSIS — R1013 Epigastric pain: Secondary | ICD-10-CM | POA: Diagnosis not present

## 2016-09-03 DIAGNOSIS — K625 Hemorrhage of anus and rectum: Secondary | ICD-10-CM | POA: Diagnosis not present

## 2016-09-03 LAB — CBC WITH DIFFERENTIAL/PLATELET
BASOS PCT: 0.6 % (ref 0.0–3.0)
Basophils Absolute: 0 10*3/uL (ref 0.0–0.1)
EOS PCT: 3.2 % (ref 0.0–5.0)
Eosinophils Absolute: 0.3 10*3/uL (ref 0.0–0.7)
HEMATOCRIT: 46.1 % (ref 39.0–52.0)
HEMOGLOBIN: 15.8 g/dL (ref 13.0–17.0)
LYMPHS PCT: 27.4 % (ref 12.0–46.0)
Lymphs Abs: 2.3 10*3/uL (ref 0.7–4.0)
MCHC: 34.2 g/dL (ref 30.0–36.0)
MCV: 92.6 fl (ref 78.0–100.0)
MONO ABS: 0.9 10*3/uL (ref 0.1–1.0)
Monocytes Relative: 10.5 % (ref 3.0–12.0)
NEUTROS ABS: 4.8 10*3/uL (ref 1.4–7.7)
Neutrophils Relative %: 58.3 % (ref 43.0–77.0)
PLATELETS: 247 10*3/uL (ref 150.0–400.0)
RBC: 4.98 Mil/uL (ref 4.22–5.81)
RDW: 13.7 % (ref 11.5–15.5)
WBC: 8.3 10*3/uL (ref 4.0–10.5)

## 2016-09-03 MED ORDER — POTASSIUM CHLORIDE CRYS ER 20 MEQ PO TBCR: 20.0000 meq | EXTENDED_RELEASE_TABLET | Freq: Every day | ORAL | 3 refills | Status: DC

## 2016-09-03 MED ORDER — PANTOPRAZOLE SODIUM 40 MG PO TBEC: 40.0000 mg | DELAYED_RELEASE_TABLET | Freq: Every day | ORAL | 1 refills | Status: DC

## 2016-09-03 MED ORDER — PANTOPRAZOLE SODIUM 40 MG PO TBEC: 40.0000 mg | DELAYED_RELEASE_TABLET | Freq: Every day | ORAL | 1 refills | Status: AC

## 2016-09-03 NOTE — Telephone Encounter (Signed)
Sent in refill to pharmacy.

## 2016-09-03 NOTE — Patient Instructions (Signed)
You have been scheduled for an endoscopy and colonoscopy. Please follow the written instructions given to you at your visit today. Please pick up your prep supplies at the pharmacy within the next 1-3 days. If you use inhalers (even only as needed), please bring them with you on the day of your procedure. Your physician has requested that you go to www.startemmi.com and enter the access code given to you at your visit today. This web site gives a general overview about your procedure. However, you should still follow specific instructions given to you by our office regarding your preparation for the procedure.  We have sent the following medications to your pharmacy for you to pick up at your convenience: Pantoprazole 40 mg daily 30-60 mins before breakfast.   We have given you a handout on antireflux measures.   Your physician has requested that you go to the basement for the following lab work before leaving today:

## 2016-09-03 NOTE — Telephone Encounter (Signed)
ALERT PT HAS A APPT SCHEDULED pt made a cpe appt for Sept. Pt needs refills on Protonix and Klor-con sent to [facility] on [address].

## 2016-09-03 NOTE — Progress Notes (Signed)
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   Patient was last seen in clinic on 07/07/16 by Alonza Bogus, PA-C and that time was discussing a colonoscopy. At that time the patient described experiencing some constipation and small amounts of bright red blood on the toilet paper a few occasions after straining for hard stool. He thought his constipation was due to his poor diet and had been trying to eat better. Since eating oatmeal every day for breakfast he was having bowel movements like clockwork and had seen no further blood. Patient was scheduled for colonoscopy on 07/30/16 at [facility] with Dr. Silverio Decamp.   Today, the patient presents to clinic accompanied by his wife and tells me that since being seen in clinic last, his symptoms have worsened. He notes that he did show up for his colonoscopy on 07/30/16 but was told that because he had a piece of chicken the night before during his prep that he would be unable to have this procedure completed, so he just left without seeing Dr. Silverio Decamp. He apologizes but does tell me that he was running clear and called the clinic the night before after eating and was told that he should be okay.   Currently, the patient tells me that his bleeding has increased, noting that before it was just with hard stools and constipation and now he sees this blood 3/5 bowel movements mixed in with his stool. It is always bright red in color but sometimes fills the toilet bowl. Patient does have about 2-3 soft bowel movements per day. He has currently stopped  taking his stool softener as he has not needed it at the moment.   Today the patient also describes a lot of epigastric symptoms noting that he often feels some "burning/warm feeling" right under my ribs. He tells me that it can stay there for as long as 30 minutes at a time and he does not think it is directly associated with eating. Patient does tell me that he was prescribed Pantoprazole 40 mg once daily but does not remember to "take this like I should". He did notice that when he took this medication for the past 2 days he has not had any abdominal pain. Associated symptoms include early satiety and decreased appetite. Patient notes he has lost a total of 14 pounds over the past 2-1/2 months.   The patient tells me he is very anxious regarding possible cancer diagnosis and would like to be worked up as soon as possible. "I am losing sleep over this because I am so worried".   Patient denies fever, chills, fatigue, dizziness, syncope, nausea, vomiting, heartburn, reflux, dysphagia or symptoms that awaken him at night.  Past Medical History:  Diagnosis Date  . Anxiety state, unspecified   . Blood in stool 7-8 months ago  . Chronic combined systolic (congestive) and diastolic (congestive) heart failure ([place])    a. 03/2014 Echo: EF 20-25%, diff HK, Gr 2 DD, mild AI, sev dil LA, mod reduced RV fxn, PASP 29mmHg.  Marland Kitchen Coronary artery disease    a. 2011 s/p CABG x 3.  Marland Kitchen  GERD (gastroesophageal reflux disease)    takes protonix q3 days as of 12/2015  . Hyperlipidemia   . Hypertension   . Hypertensive heart disease with heart failure ([place])   . Ischemic cardiomyopathy    a. 03/2014 Echo: EF 20-25%;  b. 06/2014 s/p BSX single lead AICD, ser # [id number].  . Marijuana abuse   . MI (myocardial infarction) ([place]) 03/2011  . Noncompliance   . Perirectal abscess    once prior as of 12/2015  . Sciatica   . STD (sexually transmitted disease)   . Tobacco abuse   . Wears glasses     Past Surgical History:    Procedure Laterality Date  . BYPASS GRAFT  2011   3 vessel  . EP IMPLANTABLE DEVICE  06/17/2014   DR [name]  . FOOT SURGERY     trauma repair, 51yo, lawnmower incident left big toe  . IMPLANTABLE CARDIOVERTER DEFIBRILLATOR IMPLANT N/A 06/17/2014   Procedure: IMPLANTABLE CARDIOVERTER DEFIBRILLATOR IMPLANT;  Surgeon: Deboraha Sprang, MD;  Location: [facility] CATH LAB;  Service: Cardiovascular;  Laterality: N/A;  . INCISION AND DRAINAGE PERIRECTAL ABSCESS N/A 06/13/2015   Procedure: IRRIGATION AND DEBRIDEMENT PERIRECTAL ABSCESS;  Surgeon: Michael Boston, MD;  Location: [facility] ORS;  Service: General;  Laterality: N/A;    Current Outpatient Prescriptions  Medication Sig Dispense Refill  . amLODipine (NORVASC) 10 MG tablet Take 1 tablet (10 mg total) by mouth daily. 30 tablet 0  . aspirin EC 81 MG tablet Take 1 tablet (81 mg total) by mouth daily. 90 tablet 3  . carvedilol (COREG) 25 MG tablet Take 1 tablet (25 mg total) by mouth 2 (two) times daily with a meal. (Patient taking differently: Take 25 mg by mouth 2 (two) times daily. ) 60 tablet 3  . Docusate Calcium (STOOL SOFTENER PO) Take 1 tablet by mouth daily as needed (constipation).    . furosemide (LASIX) 40 MG tablet Take 1 tablet (40 mg total) by mouth daily. 30 tablet 3  . lisinopril (PRINIVIL,ZESTRIL) 20 MG tablet Take 1 tablet (20 mg total) by mouth daily. 30 tablet 3  . NITROSTAT 0.4 MG SL tablet Place 1 tablet (0.4 mg total) under the tongue every 5 (five) minutes as needed for chest pain. Please call office. 25 tablet 3  . pantoprazole (PROTONIX) 40 MG tablet Take 1 tablet (40 mg total) by mouth daily. 90 tablet 1  . potassium chloride SA (K-DUR,KLOR-CON) 20 MEQ tablet Take 1 tablet (20 mEq total) by mouth daily. 30 tablet 3  . rosuvastatin (CRESTOR) 20 MG tablet Take 1 tablet (20 mg total) by mouth daily. 30 tablet 11  . sildenafil (VIAGRA) 100 MG tablet Take 1 tablet (100 mg total) by mouth as needed for erectile dysfunction. 20 tablet 6   No  current facility-administered medications for this visit.     Allergies as of 09/03/2016  . (No Known Allergies)    Family History  Problem Relation Age of Onset  . Hypertension Mother        deceased.  . Diabetes Mother   . Heart disease Mother   . Alzheimer's disease Father   . Gallbladder disease Sister   . Other Brother        drug abuse, cocaine  . Cancer Neg Hx   . Stroke Neg Hx   . Colon cancer Neg Hx   . Stomach cancer Neg Hx   . Rectal cancer Neg Hx   . Esophageal cancer Neg Hx   . Liver cancer  Neg Hx     Social History   Social History  . Marital status: Married    Spouse name: N/A  . Number of children: 3  . Years of education: N/A   Occupational History  . unemployed    Social History Main Topics  . Smoking status: Current Some Day Smoker    Packs/day: 0.50    Years: 23.00  . Smokeless tobacco: Never Used  . Alcohol use 0.6 oz/week    1 Shots of liquor per week     Comment: occ  . Drug use: Yes    Types: Marijuana     Comment: none for  18 days  . Sexual activity: Not on file   Other Topics Concern  . Not on file   Social History Narrative   Lives in [place] with wife. Walking some for exercise.  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  Has 3 children in [place], [place].   Christian.  12/2015    Review of Systems:    Constitutional: No fever or chills Cardiovascular: No chest pain Respiratory: No SOB Gastrointestinal: See HPI and otherwise negative   Physical Exam:  Vital signs: BP (!) 148/78 (BP Location: Left Arm, Patient Position: Sitting)   Pulse 64   Ht 5\' 11"  (1.803 m)   Wt 184 lb (83.5 kg)   SpO2 98%   BMI 25.66 kg/m    Constitutional:   Pleasant African American male appears to be in NAD, Well developed, Well nourished, alert and cooperative Respiratory: Respirations even and unlabored. Lungs clear to auscultation bilaterally.   No wheezes, crackles, or  rhonchi.  Cardiovascular: Normal S1, S2. No MRG. Regular rate and rhythm. No peripheral edema, cyanosis or pallor.  Gastrointestinal:  Soft, nondistended, mild epigastric ttp, No rebound or guarding. Normal bowel sounds. No appreciable masses or hepatomegaly. Psychiatric: Demonstrates good judgement and reason without abnormal affect or behaviors.  MOST RECENT LABS: CBC    Component Value Date/Time   WBC 8.8 06/07/2016 0343   RBC 4.94 06/07/2016 0343   HGB 15.6 06/07/2016 0343   HCT 45.7 06/07/2016 0343   PLT 244 06/07/2016 0343   MCV 92.5 06/07/2016 0343   MCV 88.6 05/06/2015 1923   MCH 31.6 06/07/2016 0343   MCHC 34.1 06/07/2016 0343   RDW 13.5 06/07/2016 0343   LYMPHSABS 1.6 12/12/2015 0710   MONOABS 0.8 12/12/2015 0710   EOSABS 0.2 12/12/2015 0710   BASOSABS 0.0 12/12/2015 0710    CMP     Component Value Date/Time   NA 143 07/16/2016 0932   K 4.3 07/16/2016 0932   CL 102 07/16/2016 0932   CO2 25 07/16/2016 0932   GLUCOSE 85 07/16/2016 0932   GLUCOSE 99 06/07/2016 0343   BUN 14 07/16/2016 0932   CREATININE 1.00 07/16/2016 0932   CREATININE 1.06 12/17/2015 0928   CALCIUM 9.8 07/16/2016 0932   PROT 6.9 06/06/2016 0519   ALBUMIN 3.6 06/06/2016 0519   AST 24 06/06/2016 0519   ALT 11 (L) 06/06/2016 0519   ALKPHOS 78 06/06/2016 0519   BILITOT 1.3 (H) 06/06/2016 0519   GFRNONAA 89 07/16/2016 0932   GFRNONAA 57 (L) 05/06/2015 1923   GFRAA 102 07/16/2016 0932   GFRAA 66 05/06/2015 1923    Assessment: 1. Rectal bleeding: Continue/wors per the patient, now not just with a hard stool; continue to consider internal hemorrhoids versus AVM versus diverticular versus cancer versus other 2. Weight loss: Total of  14 pounds in the past 2 months without trying, likely due to decrease in appetite and early satiety 3. Early satiety: Patient adds that he "feels full all the time", do he doesn't eat very much. This is likely contributing to the above, likely related to PUD versus  other 4. Epigastric pain: New over the past 1-2 months, associated with all the above; Consider PUD vs gastritis vs other  Plan: 1. Scheduled patient for an EGD and colonoscopy in the hospital due to his decreased ejection fraction. This will be scheduled with Dr. Silverio Decamp. This is potentially scheduled for the first week in June as Dr. Silverio Decamp is in the hospital, but will need to make sure this is okay with her. Did discuss this with the patient. Did discuss risks, notes, limitations and alternatives this procedure and the patient agrees to proceed. 2. Recommend the patient stay on his Pantoprazole 40 mg daily routinely. Did refill this medication for him today. We did discuss that if he continues epigastric pain and early satiety he can call our clinic next week and we can increase his medication to twice a day. 3. Reviewed antireflux diet and lifestyle modifications. Patient's largest contributing factor is likely that he eats large meals late at night and goes to sleep about 10 minutes later, we discussed this in detail 4. Patient to return to clinic per recommendations from Dr. Silverio Decamp after procedures.  Ellouise Newer, PA-C [facility] 09/03/2016, 1:17 PM  Cc: Carlena Hurl, PA-C

## 2016-09-09 ENCOUNTER — Other Ambulatory Visit: Payer: Self-pay | Admitting: Emergency Medicine

## 2016-09-09 ENCOUNTER — Telehealth: Payer: Self-pay | Admitting: Physician Assistant

## 2016-09-09 DIAGNOSIS — K625 Hemorrhage of anus and rectum: Secondary | ICD-10-CM

## 2016-09-09 DIAGNOSIS — R1084 Generalized abdominal pain: Secondary | ICD-10-CM

## 2016-09-09 DIAGNOSIS — R634 Abnormal weight loss: Secondary | ICD-10-CM

## 2016-09-09 DIAGNOSIS — R6881 Early satiety: Secondary | ICD-10-CM

## 2016-09-09 MED ORDER — NA SULFATE-K SULFATE-MG SULF 17.5-3.13-1.6 GM/177ML PO SOLN: 1.0000 | ORAL | 0 refills | Status: DC

## 2016-09-09 NOTE — Telephone Encounter (Signed)
Spoke to pt and informed him to use Preparation H suppositories at bedtime for the next week. Endo/colon scheduled for 09-28-16 at 12 pm. Patient informed and went over instructions with patient. He verbalized understanding.

## 2016-09-09 NOTE — Telephone Encounter (Signed)
Hgb was stable, likely small volume blood per rectum secondary to hemorrhoidal hemorrhage. Preparation H suppository at bedtime X 1 week. Will need to schedule procedure at hospital next available. If continues to have significant bleeding or worsening bleeding will need to go to ER for evaluation.

## 2016-09-09 NOTE — Telephone Encounter (Signed)
The pt states he has continued rectal bleeding and feels weak.  Dr Silverio Decamp per Jennifer's note you were looking at a date and time during your hospital week for colon endo the first week of June.  Pt was advised if he begins to have more bleeding, SOB, or dizziness he should go to the ED for eval.  His HgB 6 days ago was 15.8.  Please advise

## 2016-09-09 NOTE — Progress Notes (Signed)
Reviewed and agree with documentation and assessment and plan. K. Veena Nandigam , MD   

## 2016-09-23 ENCOUNTER — Other Ambulatory Visit: Payer: Self-pay | Admitting: Medical

## 2016-09-23 ENCOUNTER — Telehealth: Payer: Self-pay | Admitting: Medical

## 2016-09-23 NOTE — Telephone Encounter (Signed)
Pt left a voice mail requesting a refill on Lisinopril 20 mg and Amlodipine 10 mg

## 2016-09-24 ENCOUNTER — Other Ambulatory Visit: Payer: Self-pay

## 2016-09-24 ENCOUNTER — Encounter (HOSPITAL_COMMUNITY): Payer: Self-pay | Admitting: *Deleted

## 2016-09-24 MED ORDER — ASPIRIN EC 81 MG PO TBEC: 81.0000 mg | DELAYED_RELEASE_TABLET | Freq: Every day | ORAL | 3 refills | Status: AC

## 2016-09-24 MED ORDER — LISINOPRIL 20 MG PO TABS: 20.0000 mg | ORAL_TABLET | Freq: Every day | ORAL | 3 refills | Status: DC

## 2016-09-24 NOTE — Telephone Encounter (Signed)
Sent refill to pharmacy. 

## 2016-09-28 ENCOUNTER — Encounter (HOSPITAL_COMMUNITY): Payer: Self-pay | Admitting: Emergency Medicine

## 2016-09-28 ENCOUNTER — Ambulatory Visit (HOSPITAL_COMMUNITY)
Admission: RE | Admit: 2016-09-28 | Discharge: 2016-09-28 | Disposition: A | Discharge status: Home / Self Care | Payer: PRIVATE HEALTH INSURANCE | Source: Ambulatory Visit | Attending: Gastroenterology | Admitting: Gastroenterology

## 2016-09-28 ENCOUNTER — Ambulatory Visit (HOSPITAL_COMMUNITY): Payer: PRIVATE HEALTH INSURANCE | Admitting: Anesthesiology

## 2016-09-28 ENCOUNTER — Encounter (HOSPITAL_COMMUNITY)
Admission: RE | Disposition: A | Discharge status: Home / Self Care | Payer: Self-pay | Source: Ambulatory Visit | Attending: Gastroenterology

## 2016-09-28 DIAGNOSIS — I252 Old myocardial infarction: Secondary | ICD-10-CM | POA: Diagnosis not present

## 2016-09-28 DIAGNOSIS — E785 Hyperlipidemia, unspecified: Secondary | ICD-10-CM | POA: Diagnosis not present

## 2016-09-28 DIAGNOSIS — R1084 Generalized abdominal pain: Secondary | ICD-10-CM

## 2016-09-28 DIAGNOSIS — K228 Other specified diseases of esophagus: Secondary | ICD-10-CM | POA: Diagnosis not present

## 2016-09-28 DIAGNOSIS — Q399 Congenital malformation of esophagus, unspecified: Secondary | ICD-10-CM | POA: Diagnosis present

## 2016-09-28 DIAGNOSIS — R634 Abnormal weight loss: Secondary | ICD-10-CM

## 2016-09-28 DIAGNOSIS — Z7982 Long term (current) use of aspirin: Secondary | ICD-10-CM | POA: Insufficient documentation

## 2016-09-28 DIAGNOSIS — I110 Hypertensive heart disease with heart failure: Secondary | ICD-10-CM | POA: Insufficient documentation

## 2016-09-28 DIAGNOSIS — I255 Ischemic cardiomyopathy: Secondary | ICD-10-CM | POA: Insufficient documentation

## 2016-09-28 DIAGNOSIS — T18108A Unspecified foreign body in esophagus causing other injury, initial encounter: Secondary | ICD-10-CM

## 2016-09-28 DIAGNOSIS — Z951 Presence of aortocoronary bypass graft: Secondary | ICD-10-CM | POA: Insufficient documentation

## 2016-09-28 DIAGNOSIS — I5042 Chronic combined systolic (congestive) and diastolic (congestive) heart failure: Secondary | ICD-10-CM | POA: Insufficient documentation

## 2016-09-28 DIAGNOSIS — K625 Hemorrhage of anus and rectum: Secondary | ICD-10-CM | POA: Diagnosis not present

## 2016-09-28 DIAGNOSIS — K2210 Ulcer of esophagus without bleeding: Secondary | ICD-10-CM | POA: Insufficient documentation

## 2016-09-28 DIAGNOSIS — F419 Anxiety disorder, unspecified: Secondary | ICD-10-CM | POA: Insufficient documentation

## 2016-09-28 DIAGNOSIS — R630 Anorexia: Secondary | ICD-10-CM | POA: Insufficient documentation

## 2016-09-28 DIAGNOSIS — I2510 Atherosclerotic heart disease of native coronary artery without angina pectoris: Secondary | ICD-10-CM | POA: Insufficient documentation

## 2016-09-28 DIAGNOSIS — D123 Benign neoplasm of transverse colon: Secondary | ICD-10-CM

## 2016-09-28 DIAGNOSIS — F17210 Nicotine dependence, cigarettes, uncomplicated: Secondary | ICD-10-CM | POA: Diagnosis not present

## 2016-09-28 DIAGNOSIS — K219 Gastro-esophageal reflux disease without esophagitis: Secondary | ICD-10-CM | POA: Insufficient documentation

## 2016-09-28 DIAGNOSIS — R6881 Early satiety: Secondary | ICD-10-CM

## 2016-09-28 DIAGNOSIS — K635 Polyp of colon: Secondary | ICD-10-CM

## 2016-09-28 DIAGNOSIS — Z1211 Encounter for screening for malignant neoplasm of colon: Secondary | ICD-10-CM

## 2016-09-28 DIAGNOSIS — Z95810 Presence of automatic (implantable) cardiac defibrillator: Secondary | ICD-10-CM | POA: Insufficient documentation

## 2016-09-28 HISTORY — PX: COLONOSCOPY WITH PROPOFOL: SHX5780

## 2016-09-28 HISTORY — PX: ESOPHAGOGASTRODUODENOSCOPY (EGD) WITH PROPOFOL: SHX5813

## 2016-09-28 SURGERY — ESOPHAGOGASTRODUODENOSCOPY (EGD) WITH PROPOFOL
Anesthesia: Monitor Anesthesia Care

## 2016-09-28 MED ORDER — PROPOFOL 10 MG/ML IV BOLUS
INTRAVENOUS | Status: AC
  Filled 2016-09-28: qty 20

## 2016-09-28 MED ORDER — LACTATED RINGERS IV SOLN
INTRAVENOUS | Status: DC | PRN
  Administered 2016-09-28 (×2): via INTRAVENOUS

## 2016-09-28 MED ORDER — SODIUM CHLORIDE 0.9 % IV SOLN: INTRAVENOUS | Status: DC

## 2016-09-28 MED ORDER — ESMOLOL HCL 100 MG/10ML IV SOLN
INTRAVENOUS | Status: AC
  Filled 2016-09-28: qty 10

## 2016-09-28 MED ORDER — HYDRALAZINE HCL 20 MG/ML IJ SOLN
INTRAMUSCULAR | Status: AC
  Filled 2016-09-28: qty 1

## 2016-09-28 MED ORDER — PROPOFOL 10 MG/ML IV BOLUS
INTRAVENOUS | Status: DC | PRN
Start: 2016-09-28 — End: 2016-09-28
  Administered 2016-09-28 (×2): 20 mg via INTRAVENOUS
  Administered 2016-09-28: 40 mg via INTRAVENOUS
  Administered 2016-09-28 (×3): 20 mg via INTRAVENOUS

## 2016-09-28 MED ORDER — PROPOFOL 500 MG/50ML IV EMUL
INTRAVENOUS | Status: DC | PRN
  Administered 2016-09-28: 100 ug/kg/min via INTRAVENOUS

## 2016-09-28 MED ORDER — PROPOFOL 10 MG/ML IV BOLUS
INTRAVENOUS | Status: AC
  Filled 2016-09-28: qty 40

## 2016-09-28 MED ORDER — ESMOLOL HCL 100 MG/10ML IV SOLN
INTRAVENOUS | Status: DC | PRN
  Administered 2016-09-28: 10 mg via INTRAVENOUS

## 2016-09-28 MED ORDER — SODIUM CHLORIDE 0.9 % IV SOLN
INTRAVENOUS | Status: DC
Start: 2016-09-28 — End: 2016-09-28

## 2016-09-28 MED ORDER — HYDRALAZINE HCL 20 MG/ML IJ SOLN
INTRAMUSCULAR | Status: DC | PRN
  Administered 2016-09-28: 5 mg via INTRAVENOUS

## 2016-09-28 SURGICAL SUPPLY — 24 items

## 2016-09-28 NOTE — Interval H&P Note (Signed)
History and Physical Interval Note:  09/28/2016 8:42 AM  Roger Martinez  has presented today for surgery, with the diagnosis of rectal bleeding, weight loss, early satiety, abdominal pain/db  The various methods of treatment have been discussed with the patient and family. After consideration of risks, benefits and other options for treatment, the patient has consented to  Procedure(s): ESOPHAGOGASTRODUODENOSCOPY (EGD) WITH PROPOFOL (N/A) COLONOSCOPY WITH PROPOFOL (N/A) as a surgical intervention .  The patient's history has been reviewed, patient examined, no change in status, stable for surgery.  I have reviewed the patient's chart and labs.  Questions were answered to the patient's satisfaction.     Kavitha Nandigam

## 2016-09-28 NOTE — Anesthesia Preprocedure Evaluation (Addendum)
Anesthesia Evaluation  Patient identified by MRN, date of birth, ID band Patient awake    Reviewed: Allergy & Precautions, NPO status , Patient's Chart, lab work & pertinent test results  Airway Mallampati: II  TM Distance: >3 FB Neck ROM: Full    Dental no notable dental hx.    Pulmonary neg pulmonary ROS, Current Smoker,    Pulmonary exam normal breath sounds clear to auscultation       Cardiovascular hypertension, + CAD, + Past MI, + CABG and +CHF  + Cardiac Defibrillator  Rhythm:Regular Rate:Normal + Systolic murmurs Left ventricle: Diffuse hypokinesis with inferior akinesis at mid   and basal level. The cavity size was moderately dilated. Wall   thickness was increased in a pattern of severe LVH. Systolic   function was moderately to severely reduced. The estimated   ejection fraction was in the range of 30% to 35%. Doppler   parameters are consistent with both elevated ventricular   end-diastolic filling pressure and elevated left atrial filling   pressure. - Aortic valve: There was mild regurgitation. Valve area (VTI):   2.93 cm^2. Valve area (Vmax): 2.66 cm^2. Valve area (Vmean): 2.67   cm^2. - Mitral valve: There was mild regurgitation. - Left atrium: The atrium was severely dilated. - Pulmonary arteries: PA peak pressure: 35 mm Hg (S). - Impressions: Severe LVH consider MRI to r/o amyloid or non   obstructive HOCM if clinically indicated.  Impressions:  - Severe LVH consider MRI to r/o amyloid or non obstructive HOCM if   clinically indicated.  No antihypertensives   Neuro/Psych negative neurological ROS  negative psych ROS   GI/Hepatic negative GI ROS, Neg liver ROS,   Endo/Other  negative endocrine ROS  Renal/GU negative Renal ROS  negative genitourinary   Musculoskeletal negative musculoskeletal ROS (+)   Abdominal   Peds negative pediatric ROS (+)  Hematology negative hematology ROS (+)    Anesthesia Other Findings   Reproductive/Obstetrics negative OB ROS                           Anesthesia Physical Anesthesia Plan  ASA: IV  Anesthesia Plan: MAC   Post-op Pain Management:    Induction: Intravenous  PONV Risk Score and Plan: 0  Airway Management Planned: Nasal Cannula  Additional Equipment:   Intra-op Plan:   Post-operative Plan:   Informed Consent: I have reviewed the patients History and Physical, chart, labs and discussed the procedure including the risks, benefits and alternatives for the proposed anesthesia with the patient or authorized representative who has indicated his/her understanding and acceptance.   Dental advisory given  Plan Discussed with: CRNA and Surgeon  Anesthesia Plan Comments:         Anesthesia Quick Evaluation

## 2016-09-28 NOTE — Anesthesia Postprocedure Evaluation (Signed)
Anesthesia Post Note  Patient: Roger Martinez  Procedure(s) Performed: Procedure(s) (LRB): ESOPHAGOGASTRODUODENOSCOPY (EGD) WITH PROPOFOL (N/A) COLONOSCOPY WITH PROPOFOL (N/A)     Patient location during evaluation: PACU Anesthesia Type: MAC Level of consciousness: awake and alert Pain management: pain level controlled Vital Signs Assessment: post-procedure vital signs reviewed and stable Respiratory status: spontaneous breathing, nonlabored ventilation, respiratory function stable and patient connected to nasal cannula oxygen Cardiovascular status: stable and blood pressure returned to baseline Anesthetic complications: no    Last Vitals:  Vitals:   09/28/16 1020 09/28/16 1030  BP: (!) 197/93 (!) 187/84  Pulse: (!) 54 (!) 50  Resp: 17 14  Temp:      Last Pain:  Vitals:   09/28/16 0956  TempSrc: Oral                 Elizer Bostic S

## 2016-09-28 NOTE — Op Note (Signed)
[facility] Patient Name: Roger Martinez Procedure Date: 09/28/2016 MRN: [id number] Attending MD: Mauri Pole , MD Date of Birth: Sep 06, 1965 CSN: [id number] Age: 51 Admit Type: Outpatient Procedure:                Colonoscopy Indications:              Evaluation of unexplained GI bleeding, Weight loss Providers:                Mauri Pole, MD, Kingsley Plan, RN,                            Lillie Fragmin, RN, Tinnie Gens, Technician Referring MD:              Medicines:                Monitored Anesthesia Care Complications:            No immediate complications. Estimated Blood Loss:     Estimated blood loss was minimal. Procedure:                Pre-Anesthesia Assessment:                           - Prior to the procedure, a History and Physical                            was performed, and patient medications and                            allergies were reviewed. The patient's tolerance of                            previous anesthesia was also reviewed. The risks                            and benefits of the procedure and the sedation                            options and risks were discussed with the patient.                            All questions were answered, and informed consent                            was obtained. Prior Anticoagulants: The patient has                            taken no previous anticoagulant or antiplatelet                            agents. ASA Grade Assessment: III - A patient with                            severe systemic disease. After reviewing the risks  and benefits, the patient was deemed in                            satisfactory condition to undergo the procedure.                           - Prior to the procedure, a History and Physical                            was performed, and patient medications and                            allergies were reviewed. The patient's  tolerance of                            previous anesthesia was also reviewed. The risks                            and benefits of the procedure and the sedation                            options and risks were discussed with the patient.                            All questions were answered, and informed consent                            was obtained. Prior Anticoagulants: The patient has                            taken no previous anticoagulant or antiplatelet                            agents. ASA Grade Assessment: III - A patient with                            severe systemic disease. After reviewing the risks                            and benefits, the patient was deemed in                            satisfactory condition to undergo the procedure.                           After obtaining informed consent, the colonoscope                            was passed under direct vision. Throughout the                            procedure, the patient's blood pressure, pulse, and  oxygen saturations were monitored continuously. The                            EC-[id number] ([id number]) scope was introduced through                            the anus and advanced to the the terminal ileum,                            with identification of the appendiceal orifice and                            IC valve. The colonoscopy was performed without                            difficulty. The patient tolerated the procedure                            well. The quality of the bowel preparation was                            good. The terminal ileum, ileocecal valve,                            appendiceal orifice, and rectum were photographed. Scope In: 9:32:17 AM Scope Out: 9:49:29 AM Scope Withdrawal Time: 0 hours 11 minutes 3 seconds  Total Procedure Duration: 0 hours 17 minutes 12 seconds  Findings:      The perianal and digital rectal examinations were normal.      A 4 mm  polyp was found in the transverse colon. The polyp was sessile.       The polyp was removed with a cold snare. Resection and retrieval were       complete.      Scattered small and large-mouthed diverticula were found in the sigmoid       colon, descending colon, transverse colon and ascending colon.       Peri-diverticular erythema was seen. There was no evidence of       diverticular bleeding.      Bleeding internal hemorrhoids with stigmata of recent bleed were found       during retroflexion. The hemorrhoids were large. Impression:               - One 4 mm polyp in the transverse colon, removed                            with a cold snare. Resected and retrieved.                           - Moderate diverticulosis in the sigmoid colon, in                            the descending colon, in the transverse colon and                            in the  ascending colon. Peri-diverticular erythema                            was seen. There was no evidence of diverticular                            bleeding.                           - Bleeding internal hemorrhoids. Moderate Sedation:      N/A- Per Anesthesia Care Recommendation:           - Patient has a contact number available for                            emergencies. The signs and symptoms of potential                            delayed complications were discussed with the                            patient. Return to normal activities tomorrow.                            Written discharge instructions were provided to the                            patient.                           - Resume previous diet.                           - Continue present medications.                           - Await pathology results.                           - No aspirin, ibuprofen, naproxen, or other                            non-steroidal anti-inflammatory drugs.                           - Repeat colonoscopy in 5-10 years for surveillance                             based on pathology results.                           - Return to GI office at appointment to be                            scheduled for hemorrhoidal band ligation Procedure Code(s):        --- Professional ---  45385, Colonoscopy, flexible; with removal of                            tumor(s), polyp(s), or other lesion(s) by snare                            technique Diagnosis Code(s):        --- Professional ---                           D12.3, Benign neoplasm of transverse colon (hepatic                            flexure or splenic flexure)                           K64.8, Other hemorrhoids                           K92.2, Gastrointestinal hemorrhage, unspecified                           R63.4, Abnormal weight loss                           K57.30, Diverticulosis of large intestine without                            perforation or abscess without bleeding CPT copyright 2016 American Medical Association. All rights reserved. The codes documented in this report are preliminary and upon coder review may  be revised to meet current compliance requirements. Mauri Pole, MD 09/28/2016 10:02:01 AM This report has been signed electronically. Number of Addenda: 0

## 2016-09-28 NOTE — Transfer of Care (Signed)
Immediate Anesthesia Transfer of Care Note  Patient: Roger Martinez  Procedure(s) Performed: Procedure(s): ESOPHAGOGASTRODUODENOSCOPY (EGD) WITH PROPOFOL (N/A) COLONOSCOPY WITH PROPOFOL (N/A)  Patient Location: PACU  Anesthesia Type:MAC  Level of Consciousness:  sedated, patient cooperative and responds to stimulation  Airway & Oxygen Therapy:Patient Spontanous Breathing and Patient connected to face mask oxgen  Post-op Assessment:  Report given to PACU RN and Post -op Vital signs reviewed and stable  Post vital signs:  Reviewed and stable  Last Vitals:  Vitals:   09/28/16 0747 09/28/16 0956  BP: (!) 196/95 (!) 163/70  Pulse: (!) 53 70  Resp:  19  Temp: 36.8 C 85.9 C    Complications: No apparent anesthesia complications

## 2016-09-28 NOTE — Op Note (Signed)
[facility] Patient Name: Roger Martinez Procedure Date: 09/28/2016 MRN: [id number] Attending MD: Mauri Pole , MD Date of Birth: [birth year] CSN: [id number] Age: 51 Admit Type: Outpatient Procedure:                Upper GI endoscopy Indications:              Dysphagia, Upper abdominal symptoms associated with                            anorexia (suggesting structural disease), Upper                            abdominal symptoms associated with weight loss                            (suggesting structural disease) Providers:                Mauri Pole, MD, Kingsley Plan, RN,                            Lillie Fragmin, RN, Tinnie Gens, Technician Referring MD:              Medicines:                Monitored Anesthesia Care Complications:            No immediate complications. Estimated Blood Loss:     Estimated blood loss: none. Procedure:                Pre-Anesthesia Assessment:                           - Prior to the procedure, a History and Physical                            was performed, and patient medications and                            allergies were reviewed. The patient's tolerance of                            previous anesthesia was also reviewed. The risks                            and benefits of the procedure and the sedation                            options and risks were discussed with the patient.                            All questions were answered, and informed consent                            was obtained. Prior Anticoagulants: The patient has  taken no previous anticoagulant or antiplatelet                            agents. ASA Grade Assessment: III - A patient with                            severe systemic disease. After reviewing the risks                            and benefits, the patient was deemed in                            satisfactory condition to undergo the procedure.                   After obtaining informed consent, the endoscope was                            passed under direct vision. Throughout the                            procedure, the patient's blood pressure, pulse, and                            oxygen saturations were monitored continuously. The                            [id number]) scope was introduced through the                            mouth, and advanced to the second part of duodenum.                            The upper GI endoscopy was accomplished without                            difficulty. The patient tolerated the procedure                            well. Scope In: Scope Out: Findings:      The lumen of the esophagus was moderately dilated with retained       secretions and pill. Pushed the retained pill from esophagus into       stomach. No evidence of esophageal stricture.      LA Grade B (one or more mucosal breaks greater than 5 mm, not extending       between the tops of two mucosal folds) esophagitis with no bleeding was       found 42 to 43 cm from the incisors.      The stomach was normal.      The examined duodenum was normal. Impression:               - Dilation in the entire esophagus.                           - LA  Grade B erosive esophagitis.                           - Normal stomach.                           - Normal examined duodenum.                           - No specimens collected. Moderate Sedation:      N/A- Per Anesthesia Care Recommendation:           - Patient has a contact number available for                            emergencies. The signs and symptoms of potential                            delayed complications were discussed with the                            patient. Return to normal activities tomorrow.                            Written discharge instructions were provided to the                            patient.                           - Resume previous diet.                            - Continue present medications.                           - Perform routine esophageal manometry at                            appointment to be scheduled to evaluate for                            possible underlying esophageal dysmotility.                           - Follow an antireflux regimen. Procedure Code(s):        --- Professional ---                           [phone], Esophagogastroduodenoscopy, flexible,                            transoral; diagnostic, including collection of                            specimen(s) by brushing or washing, when performed                            (  separate procedure) Diagnosis Code(s):        --- Professional ---                           Q39.9, Congenital malformation of esophagus,                            unspecified                           K22.8, Other specified diseases of esophagus                           K20.8, Other esophagitis                           R13.10, Dysphagia, unspecified                           R63.0, Anorexia                           R19.8, Other specified symptoms and signs involving                            the digestive system and abdomen                           R63.4, Abnormal weight loss CPT copyright 2016 American Medical Association. All rights reserved. The codes documented in this report are preliminary and upon coder review may  be revised to meet current compliance requirements. Mauri Pole, MD 09/28/2016 10:08:03 AM This report has been signed electronically. Number of Addenda: 0

## 2016-09-28 NOTE — H&P (View-Only) (Signed)
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   Patient was last seen in clinic on 07/07/16 by Alonza Bogus, PA-C and that time was discussing a colonoscopy. At that time the patient described experiencing some constipation and small amounts of bright red blood on the toilet paper a few occasions after straining for hard stool. He thought his constipation was due to his poor diet and had been trying to eat better. Since eating oatmeal every day for breakfast he was having bowel movements like clockwork and had seen no further blood. Patient was scheduled for colonoscopy on 07/30/16 at [facility] with Dr. Silverio Decamp.   Today, the patient presents to clinic accompanied by his wife and tells me that since being seen in clinic last, his symptoms have worsened. He notes that he did show up for his colonoscopy on 07/30/16 but was told that because he had a piece of chicken the night before during his prep that he would be unable to have this procedure completed, so he just left without seeing Dr. Silverio Decamp. He apologizes but does tell me that he was running clear and called the clinic the night before after eating and was told that he should be okay.   Currently, the patient tells me that his bleeding has increased, noting that before it was just with hard stools and constipation and now he sees this blood 3/5 bowel movements mixed in with his stool. It is always bright red in color but sometimes fills the toilet bowl. Patient does have about 2-3 soft bowel movements per day. He has currently stopped  taking his stool softener as he has not needed it at the moment.   Today the patient also describes a lot of epigastric symptoms noting that he often feels some "burning/warm feeling" right under my ribs. He tells me that it can stay there for as long as 30 minutes at a time and he does not think it is directly associated with eating. Patient does tell me that he was prescribed Pantoprazole 40 mg once daily but does not remember to "take this like I should". He did notice that when he took this medication for the past 2 days he has not had any abdominal pain. Associated symptoms include early satiety and decreased appetite. Patient notes he has lost a total of 14 pounds over the past 2-1/2 months.   The patient tells me he is very anxious regarding possible cancer diagnosis and would like to be worked up as soon as possible. "I am losing sleep over this because I am so worried".   Patient denies fever, chills, fatigue, dizziness, syncope, nausea, vomiting, heartburn, reflux, dysphagia or symptoms that awaken him at night.  Past Medical History:  Diagnosis Date  . Anxiety state, unspecified   . Blood in stool 7-8 months ago  . Chronic combined systolic (congestive) and diastolic (congestive) heart failure ([place])    a. 03/2014 Echo: EF 20-25%, diff HK, Gr 2 DD, mild AI, sev dil LA, mod reduced RV fxn, PASP 1mmHg.  Marland Kitchen Coronary artery disease    a. 2011 s/p CABG x 3.  Marland Kitchen  GERD (gastroesophageal reflux disease)    takes protonix q3 days as of 12/2015  . Hyperlipidemia   . Hypertension   . Hypertensive heart disease with heart failure ([place])   . Ischemic cardiomyopathy    a. 03/2014 Echo: EF 20-25%;  b. 06/2014 s/p BSX single lead AICD, ser # [id number].  . Marijuana abuse   . MI (myocardial infarction) ([place]) 03/2011  . Noncompliance   . Perirectal abscess    once prior as of 12/2015  . Sciatica   . STD (sexually transmitted disease)   . Tobacco abuse   . Wears glasses     Past Surgical History:    Procedure Laterality Date  . BYPASS GRAFT  2011   3 vessel  . EP IMPLANTABLE DEVICE  06/17/2014   DR [name]  . FOOT SURGERY     trauma repair, 51yo, lawnmower incident left big toe  . IMPLANTABLE CARDIOVERTER DEFIBRILLATOR IMPLANT N/A 06/17/2014   Procedure: IMPLANTABLE CARDIOVERTER DEFIBRILLATOR IMPLANT;  Surgeon: Deboraha Sprang, MD;  Location: [facility] CATH LAB;  Service: Cardiovascular;  Laterality: N/A;  . INCISION AND DRAINAGE PERIRECTAL ABSCESS N/A 06/13/2015   Procedure: IRRIGATION AND DEBRIDEMENT PERIRECTAL ABSCESS;  Surgeon: Michael Boston, MD;  Location: [facility] ORS;  Service: General;  Laterality: N/A;    Current Outpatient Prescriptions  Medication Sig Dispense Refill  . amLODipine (NORVASC) 10 MG tablet Take 1 tablet (10 mg total) by mouth daily. 30 tablet 0  . aspirin EC 81 MG tablet Take 1 tablet (81 mg total) by mouth daily. 90 tablet 3  . carvedilol (COREG) 25 MG tablet Take 1 tablet (25 mg total) by mouth 2 (two) times daily with a meal. (Patient taking differently: Take 25 mg by mouth 2 (two) times daily. ) 60 tablet 3  . Docusate Calcium (STOOL SOFTENER PO) Take 1 tablet by mouth daily as needed (constipation).    . furosemide (LASIX) 40 MG tablet Take 1 tablet (40 mg total) by mouth daily. 30 tablet 3  . lisinopril (PRINIVIL,ZESTRIL) 20 MG tablet Take 1 tablet (20 mg total) by mouth daily. 30 tablet 3  . NITROSTAT 0.4 MG SL tablet Place 1 tablet (0.4 mg total) under the tongue every 5 (five) minutes as needed for chest pain. Please call office. 25 tablet 3  . pantoprazole (PROTONIX) 40 MG tablet Take 1 tablet (40 mg total) by mouth daily. 90 tablet 1  . potassium chloride SA (K-DUR,KLOR-CON) 20 MEQ tablet Take 1 tablet (20 mEq total) by mouth daily. 30 tablet 3  . rosuvastatin (CRESTOR) 20 MG tablet Take 1 tablet (20 mg total) by mouth daily. 30 tablet 11  . sildenafil (VIAGRA) 100 MG tablet Take 1 tablet (100 mg total) by mouth as needed for erectile dysfunction. 20 tablet 6   No  current facility-administered medications for this visit.     Allergies as of 09/03/2016  . (No Known Allergies)    Family History  Problem Relation Age of Onset  . Hypertension Mother        deceased.  . Diabetes Mother   . Heart disease Mother   . Alzheimer's disease Father   . Gallbladder disease Sister   . Other Brother        drug abuse, cocaine  . Cancer Neg Hx   . Stroke Neg Hx   . Colon cancer Neg Hx   . Stomach cancer Neg Hx   . Rectal cancer Neg Hx   . Esophageal cancer Neg Hx   . Liver cancer  Neg Hx     Social History   Social History  . Marital status: Married    Spouse name: N/A  . Number of children: 3  . Years of education: N/A   Occupational History  . unemployed    Social History Main Topics  . Smoking status: Current Some Day Smoker    Packs/day: 0.50    Years: 23.00  . Smokeless tobacco: Never Used  . Alcohol use 0.6 oz/week    1 Shots of liquor per week     Comment: occ  . Drug use: Yes    Types: Marijuana     Comment: none for  18 days  . Sexual activity: Not on file   Other Topics Concern  . Not on file   Social History Narrative   Lives in [place] with wife. Walking some for exercise.  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  Has 3 children in [place], [place].   Christian.  12/2015    Review of Systems:    Constitutional: No fever or chills Cardiovascular: No chest pain Respiratory: No SOB Gastrointestinal: See HPI and otherwise negative   Physical Exam:  Vital signs: BP (!) 148/78 (BP Location: Left Arm, Patient Position: Sitting)   Pulse 64   Ht 5\' 11"  (1.803 m)   Wt 184 lb (83.5 kg)   SpO2 98%   BMI 25.66 kg/m    Constitutional:   Pleasant African American male appears to be in NAD, Well developed, Well nourished, alert and cooperative Respiratory: Respirations even and unlabored. Lungs clear to auscultation bilaterally.   No wheezes, crackles, or  rhonchi.  Cardiovascular: Normal S1, S2. No MRG. Regular rate and rhythm. No peripheral edema, cyanosis or pallor.  Gastrointestinal:  Soft, nondistended, mild epigastric ttp, No rebound or guarding. Normal bowel sounds. No appreciable masses or hepatomegaly. Psychiatric: Demonstrates good judgement and reason without abnormal affect or behaviors.  MOST RECENT LABS: CBC    Component Value Date/Time   WBC 8.8 06/07/2016 0343   RBC 4.94 06/07/2016 0343   HGB 15.6 06/07/2016 0343   HCT 45.7 06/07/2016 0343   PLT 244 06/07/2016 0343   MCV 92.5 06/07/2016 0343   MCV 88.6 05/06/2015 1923   MCH 31.6 06/07/2016 0343   MCHC 34.1 06/07/2016 0343   RDW 13.5 06/07/2016 0343   LYMPHSABS 1.6 12/12/2015 0710   MONOABS 0.8 12/12/2015 0710   EOSABS 0.2 12/12/2015 0710   BASOSABS 0.0 12/12/2015 0710    CMP     Component Value Date/Time   NA 143 07/16/2016 0932   K 4.3 07/16/2016 0932   CL 102 07/16/2016 0932   CO2 25 07/16/2016 0932   GLUCOSE 85 07/16/2016 0932   GLUCOSE 99 06/07/2016 0343   BUN 14 07/16/2016 0932   CREATININE 1.00 07/16/2016 0932   CREATININE 1.06 12/17/2015 0928   CALCIUM 9.8 07/16/2016 0932   PROT 6.9 06/06/2016 0519   ALBUMIN 3.6 06/06/2016 0519   AST 24 06/06/2016 0519   ALT 11 (L) 06/06/2016 0519   ALKPHOS 78 06/06/2016 0519   BILITOT 1.3 (H) 06/06/2016 0519   GFRNONAA 89 07/16/2016 0932   GFRNONAA 57 (L) 05/06/2015 1923   GFRAA 102 07/16/2016 0932   GFRAA 66 05/06/2015 1923    Assessment: 1. Rectal bleeding: Continue/wors per the patient, now not just with a hard stool; continue to consider internal hemorrhoids versus AVM versus diverticular versus cancer versus other 2. Weight loss: Total of  14 pounds in the past 2 months without trying, likely due to decrease in appetite and early satiety 3. Early satiety: Patient adds that he "feels full all the time", do he doesn't eat very much. This is likely contributing to the above, likely related to PUD versus  other 4. Epigastric pain: New over the past 1-2 months, associated with all the above; Consider PUD vs gastritis vs other  Plan: 1. Scheduled patient for an EGD and colonoscopy in the hospital due to his decreased ejection fraction. This will be scheduled with Dr. Silverio Decamp. This is potentially scheduled for the first week in June as Dr. Silverio Decamp is in the hospital, but will need to make sure this is okay with her. Did discuss this with the patient. Did discuss risks, notes, limitations and alternatives this procedure and the patient agrees to proceed. 2. Recommend the patient stay on his Pantoprazole 40 mg daily routinely. Did refill this medication for him today. We did discuss that if he continues epigastric pain and early satiety he can call our clinic next week and we can increase his medication to twice a day. 3. Reviewed antireflux diet and lifestyle modifications. Patient's largest contributing factor is likely that he eats large meals late at night and goes to sleep about 10 minutes later, we discussed this in detail 4. Patient to return to clinic per recommendations from Dr. Silverio Decamp after procedures.  Roger Newer, PA-C [facility] 09/03/2016, 1:17 PM  Cc: Carlena Hurl, PA-C

## 2016-09-28 NOTE — Discharge Instructions (Signed)
YOU HAD AN ENDOSCOPIC PROCEDURE TODAY: Refer to the procedure report and other information in the discharge instructions given to you for any specific questions about what was found during the examination. If this information does not answer your questions, please call [place] office at [phone] to clarify.  ° °YOU SHOULD EXPECT: Some feelings of bloating in the abdomen. Passage of more gas than usual. Walking can help get rid of the air that was put into your GI tract during the procedure and reduce the bloating. If you had a lower endoscopy (such as a colonoscopy or flexible sigmoidoscopy) you may notice spotting of blood in your stool or on the toilet paper. Some abdominal soreness may be present for a day or two, also. ° °DIET: Your first meal following the procedure should be a light meal and then it is ok to progress to your normal diet. A half-sandwich or bowl of soup is an example of a good first meal. Heavy or fried foods are harder to digest and may make you feel nauseous or bloated. Drink plenty of fluids but you should avoid alcoholic beverages for 24 hours. If you had a esophageal dilation, please see attached instructions for diet.   ° °ACTIVITY: Your care partner should take you home directly after the procedure. You should plan to take it easy, moving slowly for the rest of the day. You can resume normal activity the day after the procedure however YOU SHOULD NOT DRIVE, use power tools, machinery or perform tasks that involve climbing or major physical exertion for 24 hours (because of the sedation medicines used during the test).  ° °SYMPTOMS TO REPORT IMMEDIATELY: °A gastroenterologist can be reached at any hour. Please call [phone]  for any of the following symptoms:  °Following lower endoscopy (colonoscopy, flexible sigmoidoscopy) °Excessive amounts of blood in the stool  °Significant tenderness, worsening of abdominal pains  °Swelling of the abdomen that is new, acute  °Fever of 100° or  higher  °Following upper endoscopy (EGD, EUS, ERCP, esophageal dilation) °Vomiting of blood or coffee ground material  °New, significant abdominal pain  °New, significant chest pain or pain under the shoulder blades  °Painful or persistently difficult swallowing  °New shortness of breath  °Black, tarry-looking or red, bloody stools ° °FOLLOW UP:  °If any biopsies were taken you will be contacted by phone or by letter within the next 1-3 weeks. Call [phone]  if you have not heard about the biopsies in 3 weeks.  °Please also call with any specific questions about appointments or follow up tests. ° °

## 2016-09-29 ENCOUNTER — Encounter: Payer: Self-pay | Admitting: Gastroenterology

## 2016-09-29 ENCOUNTER — Encounter (HOSPITAL_COMMUNITY): Payer: Self-pay | Admitting: Gastroenterology

## 2016-10-04 ENCOUNTER — Other Ambulatory Visit: Payer: PRIVATE HEALTH INSURANCE | Admitting: *Deleted

## 2016-10-04 DIAGNOSIS — E785 Hyperlipidemia, unspecified: Secondary | ICD-10-CM

## 2016-10-04 LAB — HEPATIC FUNCTION PANEL
ALT: 20 IU/L (ref 0–44)
AST: 33 IU/L (ref 0–40)
Albumin: 4 g/dL (ref 3.5–5.5)
Alkaline Phosphatase: 89 IU/L (ref 39–117)
BILIRUBIN TOTAL: 0.5 mg/dL (ref 0.0–1.2)
BILIRUBIN, DIRECT: 0.15 mg/dL (ref 0.00–0.40)
TOTAL PROTEIN: 6.7 g/dL (ref 6.0–8.5)

## 2016-10-04 LAB — LIPID PANEL
CHOL/HDL RATIO: 2.5 ratio (ref 0.0–5.0)
Cholesterol, Total: 144 mg/dL (ref 100–199)
HDL: 57 mg/dL (ref >39)
LDL Calculated: 76 mg/dL (ref 0–99)
Triglycerides: 57 mg/dL (ref 0–149)
VLDL Cholesterol Cal: 11 mg/dL (ref 5–40)

## 2016-10-08 ENCOUNTER — Telehealth: Payer: Self-pay | Admitting: Cardiology

## 2016-10-08 NOTE — Telephone Encounter (Signed)
Reviewed results of lab with pt who stated understanding.  He was very pleased

## 2016-10-08 NOTE — Telephone Encounter (Signed)
Patient returning call for results, thanks.

## 2016-10-19 ENCOUNTER — Ambulatory Visit (INDEPENDENT_AMBULATORY_CARE_PROVIDER_SITE_OTHER): Payer: PRIVATE HEALTH INSURANCE | Admitting: *Deleted

## 2016-10-19 DIAGNOSIS — I255 Ischemic cardiomyopathy: Secondary | ICD-10-CM

## 2016-10-19 NOTE — Progress Notes (Signed)
Remote ICD transmission.   

## 2016-10-21 LAB — CUP PACEART REMOTE DEVICE CHECK
HighPow Impedance: 62 Ohm
Implantable Lead Implant Date: 20160307
Implantable Lead Model: 293
Implantable Lead Serial Number: 349317
Implantable Pulse Generator Implant Date: 20160307
Lead Channel Impedance Value: 415 Ohm
Lead Channel Pacing Threshold Amplitude: 1.9 V
Lead Channel Pacing Threshold Pulse Width: 0.5 ms
MDC IDC LEAD LOCATION: 753860
MDC IDC MSMT BATTERY REMAINING LONGEVITY: 144 mo
MDC IDC MSMT BATTERY REMAINING PERCENTAGE: 100 %
MDC IDC SESS DTM: 20180710045100
MDC IDC SET LEADCHNL RV PACING AMPLITUDE: 4 V
MDC IDC SET LEADCHNL RV PACING PULSEWIDTH: 0.5 ms
MDC IDC SET LEADCHNL RV SENSING SENSITIVITY: 0.6 mV
MDC IDC STAT BRADY RV PERCENT PACED: 0 %
Pulse Gen Serial Number: 201422

## 2016-10-26 ENCOUNTER — Encounter: Payer: Self-pay | Admitting: Cardiology

## 2016-11-02 ENCOUNTER — Encounter: Payer: Self-pay | Admitting: Cardiology

## 2016-11-09 ENCOUNTER — Encounter: Payer: Self-pay | Admitting: Cardiology

## 2016-11-16 IMAGING — CR DG CHEST 2V
2 series · 2 of 2 positions shown · non-contrast
Comparison: 05/06/2015

CLINICAL DATA: Shortness of breath, productive cough for 2 weeks

EXAM:
CHEST  2 VIEW

[w chest pa]
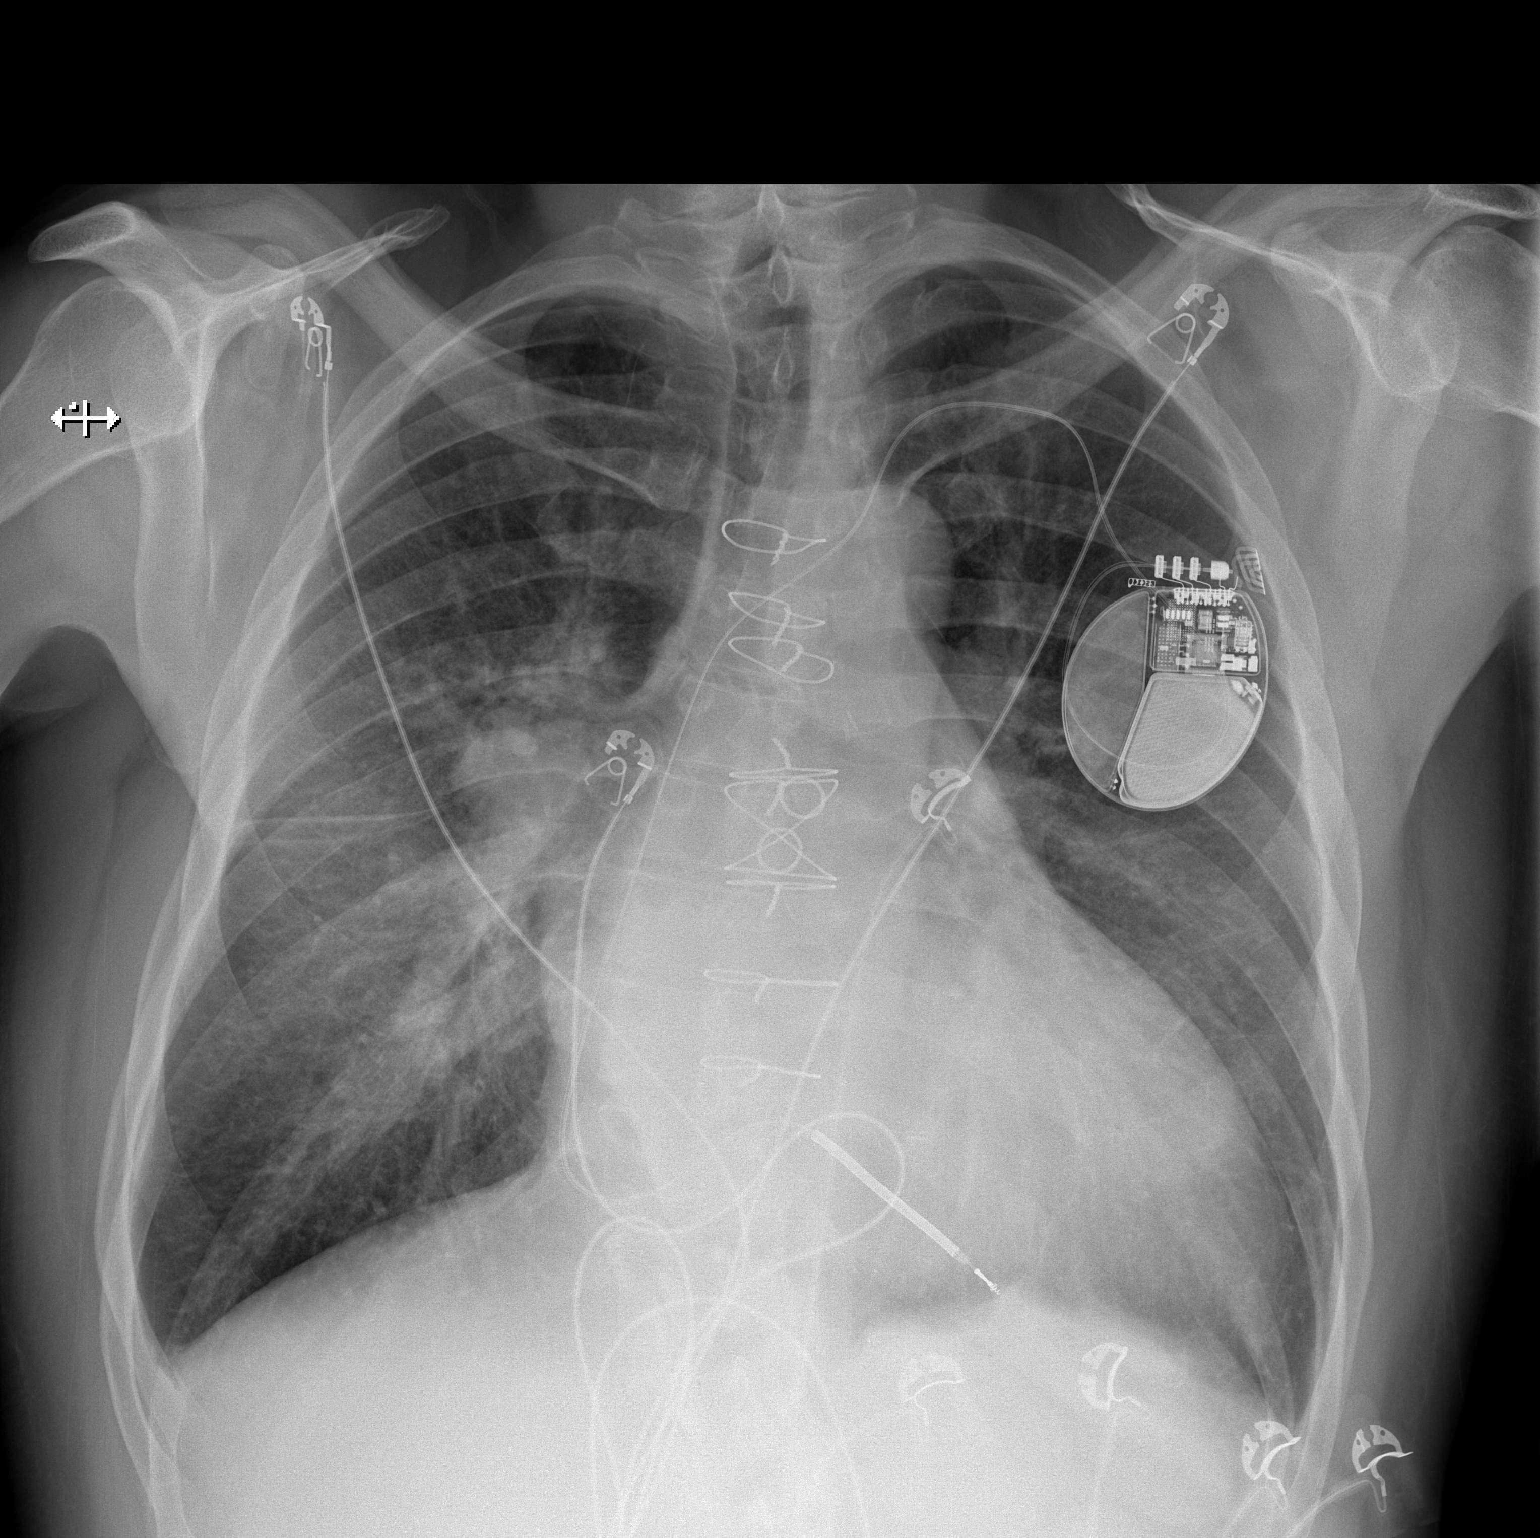

[w chest lat]
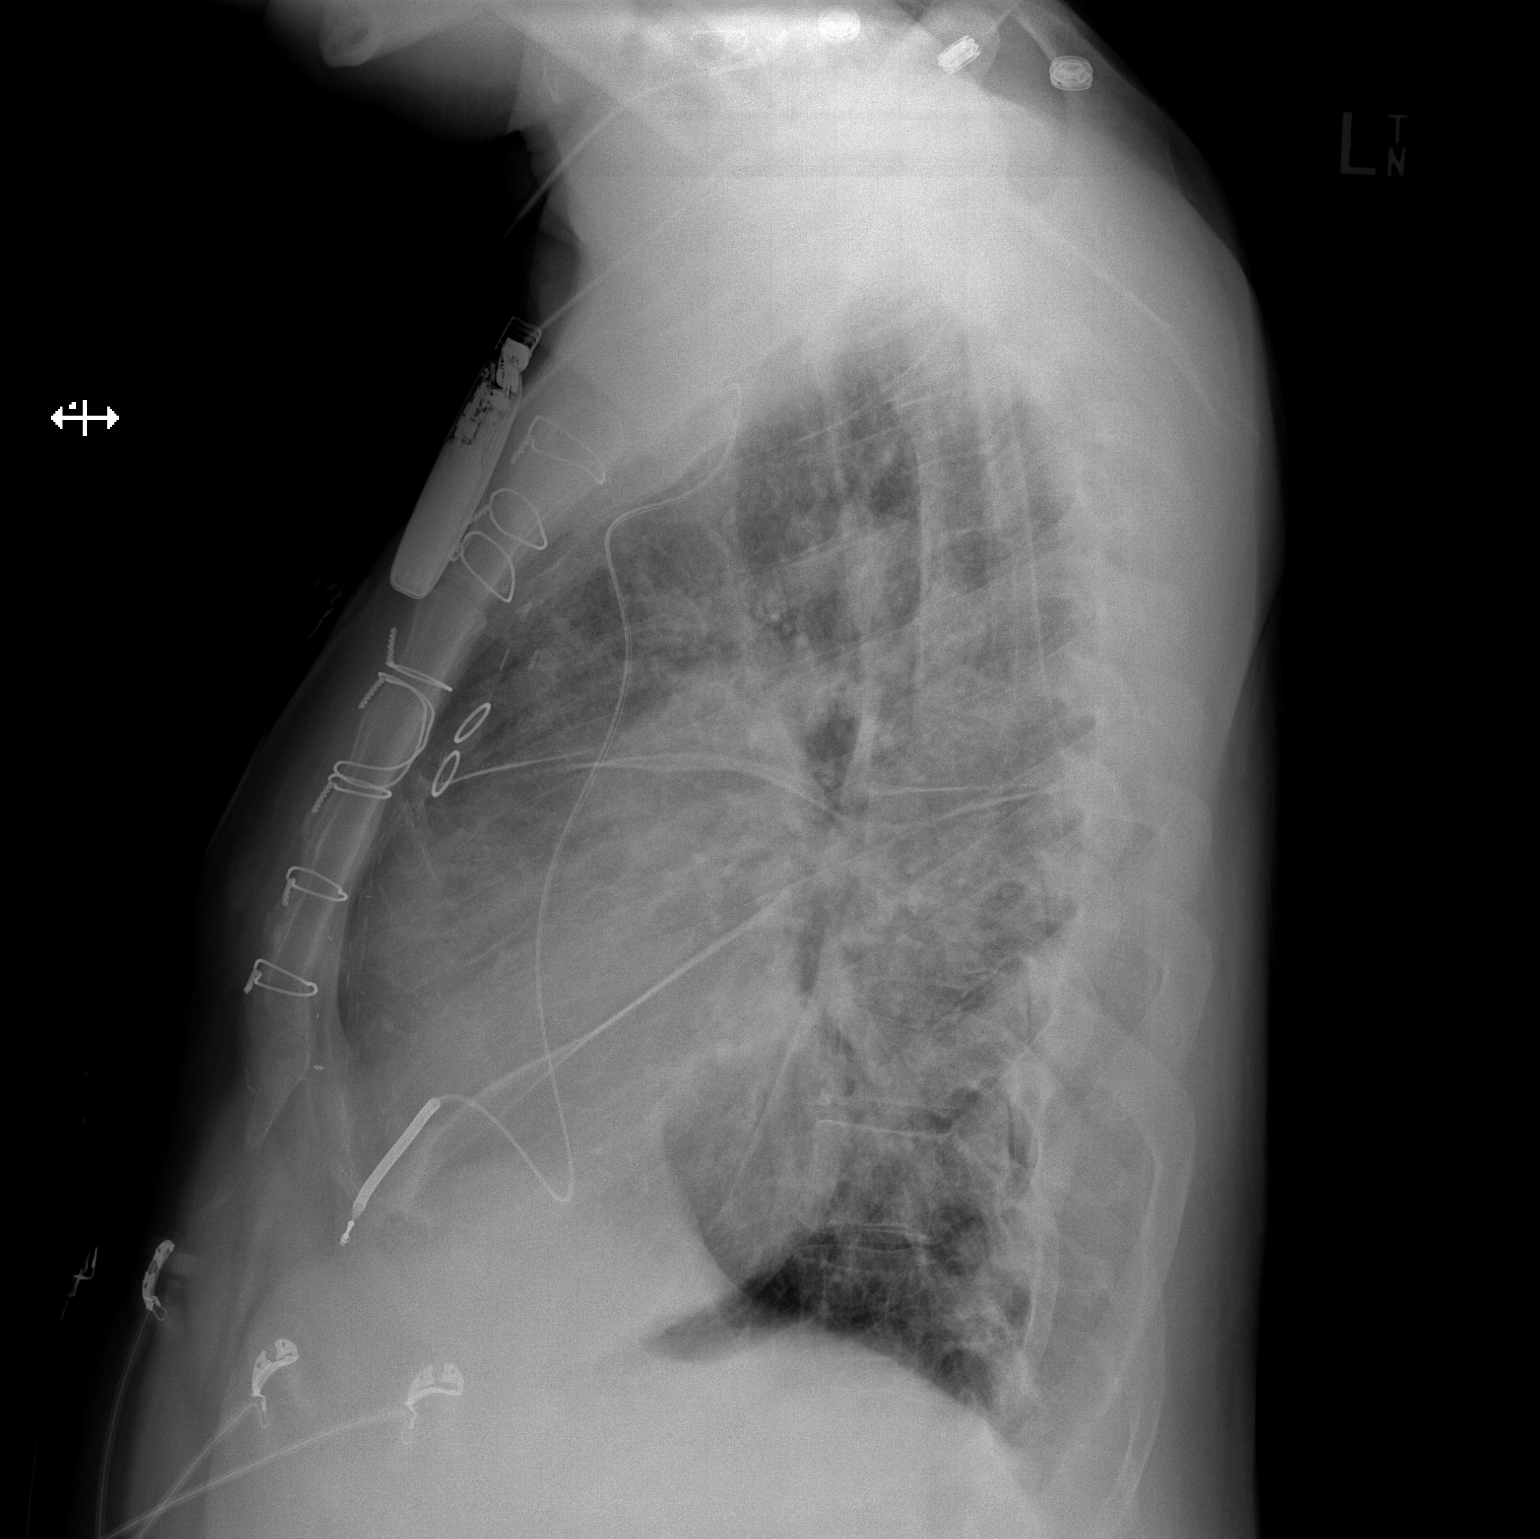

[2 of 2 positions shown; findings below may reference images not displayed]

FINDINGS: There is bilateral interstitial thickening and patchy right
perihilar alveolar airspace opacities. There is no pleural effusion
or pneumothorax. There is stable cardiomegaly. There is a single
lead cardiac pacemaker. There is evidence of prior CABG.

The osseous structures are unremarkable.
IMPRESSION: 1. Cardiomegaly with bilateral interstitial thickening and patchy
right perihilar alveolar airspace opacities. Differential
considerations include mild pulmonary vascular congestion with
possible superimposed right perihilar pneumonia.

## 2016-12-09 ENCOUNTER — Telehealth: Payer: Self-pay | Admitting: Cardiology

## 2016-12-09 NOTE — Telephone Encounter (Signed)
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  He stated understanding.

## 2016-12-09 NOTE — Telephone Encounter (Signed)
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

## 2016-12-16 ENCOUNTER — Encounter: Payer: Self-pay | Admitting: Cardiology

## 2016-12-17 ENCOUNTER — Encounter: Payer: PRIVATE HEALTH INSURANCE | Admitting: Medical

## 2017-01-18 ENCOUNTER — Telehealth: Payer: Self-pay | Admitting: Cardiology

## 2017-01-18 ENCOUNTER — Ambulatory Visit (INDEPENDENT_AMBULATORY_CARE_PROVIDER_SITE_OTHER): Payer: PRIVATE HEALTH INSURANCE | Admitting: *Deleted

## 2017-01-18 ENCOUNTER — Other Ambulatory Visit: Payer: Self-pay | Admitting: Medical

## 2017-01-18 DIAGNOSIS — I255 Ischemic cardiomyopathy: Secondary | ICD-10-CM | POA: Diagnosis not present

## 2017-01-18 NOTE — Telephone Encounter (Signed)
Spoke with pt and reminded pt of remote transmission that is due today. Pt verbalized understanding.   

## 2017-01-19 NOTE — Progress Notes (Signed)
Remote ICD transmission.   

## 2017-01-21 ENCOUNTER — Encounter: Payer: Self-pay | Admitting: Cardiology

## 2017-01-25 LAB — CUP PACEART REMOTE DEVICE CHECK
Battery Remaining Longevity: 144 mo
Date Time Interrogation Session: 20181009051700
HighPow Impedance: 66 Ohm
Implantable Lead Location: 753860
Implantable Lead Model: 293
Implantable Lead Serial Number: 349317
Lead Channel Pacing Threshold Amplitude: 1.9 V
Lead Channel Setting Pacing Amplitude: 4 V
Lead Channel Setting Pacing Pulse Width: 0.5 ms
MDC IDC LEAD IMPLANT DT: 20160307
MDC IDC MSMT BATTERY REMAINING PERCENTAGE: 100 %
MDC IDC MSMT LEADCHNL RV IMPEDANCE VALUE: 446 Ohm
MDC IDC MSMT LEADCHNL RV PACING THRESHOLD PULSEWIDTH: 0.5 ms
MDC IDC PG IMPLANT DT: 20160307
MDC IDC PG SERIAL: 201422
MDC IDC SET LEADCHNL RV SENSING SENSITIVITY: 0.6 mV
MDC IDC STAT BRADY RV PERCENT PACED: 0 %

## 2017-01-28 ENCOUNTER — Other Ambulatory Visit: Payer: Self-pay | Admitting: Medical

## 2017-02-04 ENCOUNTER — Encounter: Payer: Self-pay | Admitting: Cardiology

## 2017-02-07 ENCOUNTER — Telehealth: Payer: Self-pay | Admitting: Medical

## 2017-02-07 ENCOUNTER — Telehealth: Payer: Self-pay | Admitting: *Deleted

## 2017-02-07 ENCOUNTER — Other Ambulatory Visit: Payer: Self-pay

## 2017-02-07 MED ORDER — AMLODIPINE BESYLATE 10 MG PO TABS: 10.0000 mg | ORAL_TABLET | Freq: Every day | ORAL | 3 refills | Status: AC

## 2017-02-07 NOTE — Telephone Encounter (Signed)
Spoke with pt who is aware we are unable to email RXs to him.  He requested rx for Amlodipine 10 mg a day be sent into [facility] here and he will have it transferred to the closest [facility] to where he is.  He is on the road driving.  Advised this will be completed today.

## 2017-02-07 NOTE — Telephone Encounter (Signed)
Called pt spoke pt about his refill request,  According to his med list he is no longer on pravastatin and he should be taking the crestor, also that other doctor was giving this to him and his last refill on this was in 07/2016. He said that he got this meds mixed up and will call. Dr. Hennie Duos  Office.

## 2017-02-07 NOTE — Telephone Encounter (Signed)
Email address is actually [email or web address].

## 2017-02-07 NOTE — Telephone Encounter (Signed)
Pt called requesting a refill on Pravastain to last until his 11/13 appointment.

## 2017-02-07 NOTE — Telephone Encounter (Signed)
Patient called and left a msg on the refill vm requesting that his med list be emailed to him at [email or web address]. He can be reached at [phone]. Please advise. Thanks, MI

## 2017-02-14 ENCOUNTER — Ambulatory Visit (INDEPENDENT_AMBULATORY_CARE_PROVIDER_SITE_OTHER): Payer: PRIVATE HEALTH INSURANCE | Admitting: Family

## 2017-02-14 ENCOUNTER — Ambulatory Visit (INDEPENDENT_AMBULATORY_CARE_PROVIDER_SITE_OTHER)
Admission: RE | Admit: 2017-02-14 | Discharge: 2017-02-14 | Disposition: A | Discharge status: Home / Self Care | Payer: PRIVATE HEALTH INSURANCE | Source: Ambulatory Visit | Attending: Surgery | Admitting: Surgery

## 2017-02-14 ENCOUNTER — Encounter: Payer: Self-pay | Admitting: Family

## 2017-02-14 ENCOUNTER — Ambulatory Visit (HOSPITAL_COMMUNITY)
Admission: RE | Admit: 2017-02-14 | Discharge: 2017-02-14 | Disposition: A | Discharge status: Home / Self Care | Payer: PRIVATE HEALTH INSURANCE | Source: Ambulatory Visit | Attending: Surgery | Admitting: Surgery

## 2017-02-14 VITALS — BP 192/95 | HR 58 | Temp 98.5°F | Resp 18 | Ht 70.0 in | Wt 192.0 lb

## 2017-02-14 DIAGNOSIS — F17200 Nicotine dependence, unspecified, uncomplicated: Secondary | ICD-10-CM

## 2017-02-14 DIAGNOSIS — I739 Peripheral vascular disease, unspecified: Secondary | ICD-10-CM

## 2017-02-14 DIAGNOSIS — I70213 Atherosclerosis of native arteries of extremities with intermittent claudication, bilateral legs: Secondary | ICD-10-CM | POA: Diagnosis not present

## 2017-02-14 LAB — VAS US LOWER EXTREMITY BYPASS GRAFT DUPLEX
LPERODISTSYS: -44 cm/s
LSFDPSV: -20 cm/s
Left ant tibial distal sys: -6 cm/s
Left super femoral prox sys PSV: 135 cm/s
RIGHT ANT DIST TIBAL SYS PSV: -18 cm/s
RSFDPSV: -109 cm/s
RTIBDISTSYS: 76 cm/s
Right peroneal sys PSV: -69 cm/s
Right super femoral mid sys PSV: -561 cm/s
Right super femoral prox sys PSV: -88 cm/s
left post tibial dist sys: -9 cm/s

## 2017-02-14 NOTE — Progress Notes (Signed)
CC: Follow up Intermittent Claudication  History of Present Illness  Roger Martinez is a 51 y.o. (1965/08/21) male referred to Dr. Trula Slade by Dr. Glade Lloyd for claudication, initial evaluation was on 02-09-16.   He was sent for ultrasound evaluation which showed decreased blood flow to the left leg and so full consultation was requested.  The patient is walking for cardiac rehabilitation.  He has a history of CABG in 2011 as well as defibrillator placement in 2016.  His ejection fraction is 30-35%.  He denies any open wounds or ulcers.  He takes a statin for hypercholesterolemia and ACE inhibitor for hypertension.  He is a current smoker.  He states that he sometimes has difficulty affording his medications and finds it hard to make a decision between eating and taking his medications.   Dr. Trula Slade last evaluated pt on 02-09-16. At that time pt was able to walk approximately 1.7 miles with cardiac rehabilitation.  Given that distance, Dr. Trula Slade would not recommend any intervention at that time.  He needs to be managed medically.  Dr. Trula Slade discussed the importance of taking his medications as well as the importance of smoking cessation; also offered him a referral back to her heart failure clinic for possible financial assistance and affording his medications. Dr. Trula Slade told him he would not intervene on his legs unless he developed significantly worse symptoms or nonhealing wound. Dr. Trula Slade spent a lot of time focusing on getting pt to stop smoking as well as taking his medications.  Pt was advised to follow-up in 1 year with repeat ABIs.  Now after walking 2.6 miles, both calves tighten, relived by 5-6 minutes of rest.  He states he has not taken his meds yet today, that he needs to have food in his stomach before he eats, otherwise he gets nauseated.   Pt Diabetic: No Pt smoker: smoker  (1/2 ppd, started about age 41 yrs)  Pt meds include: Statin :Yes Betablocker: Yes ASA:  Yes Other anticoagulants/antiplatelets: no   Past Medical History:  Diagnosis Date  . Anxiety state, unspecified   . Blood in stool 7-8 months ago  . Chronic combined systolic (congestive) and diastolic (congestive) heart failure ([place])    a. 03/2014 Echo: EF 20-25%, diff HK, Gr 2 DD, mild AI, sev dil LA, mod reduced RV fxn, PASP 43mmHg.  Marland Kitchen Coronary artery disease    a. 2011 s/p CABG x 3.  . GERD (gastroesophageal reflux disease)    takes protonix q3 days as of 12/2015  . Hyperlipidemia   . Hypertension   . Hypertensive heart disease with heart failure ([place])   . Ischemic cardiomyopathy    a. 03/2014 Echo: EF 20-25%;  b. 06/2014 s/p BSX single lead AICD, ser # [id number].  . Marijuana abuse   . MI (myocardial infarction) ([place]) 03/2011  . Noncompliance   . Perirectal abscess    once prior as of 12/2015  . Sciatica   . STD (sexually transmitted disease)   . Tobacco abuse   . Wears glasses     Social History Social History   Tobacco Use  . Smoking status: Current Some Day Smoker    Packs/day: 0.25    Years: 23.00    Pack years: 5.75  . Smokeless tobacco: Never Used  Substance Use Topics  . Alcohol use: Yes    Alcohol/week: 0.6 oz    Types: 1 Shots of liquor per week    Comment: occ  . Drug use: Yes  Types: Marijuana    Comment: none for  18 days    Family History Family History  Problem Relation Age of Onset  . Hypertension Mother        deceased.  . Diabetes Mother   . Heart disease Mother   . Alzheimer's disease Father   . Gallbladder disease Sister   . Other Brother        drug abuse, cocaine  . Cancer Neg Hx   . Stroke Neg Hx   . Colon cancer Neg Hx   . Stomach cancer Neg Hx   . Rectal cancer Neg Hx   . Esophageal cancer Neg Hx   . Liver cancer Neg Hx     Surgical History Past Surgical History:  Procedure Laterality Date  . BYPASS GRAFT  2011   3 vessel  . EP IMPLANTABLE DEVICE  06/17/2014   DR [name]  . FOOT SURGERY     trauma repair, 51yo,  lawnmower incident left big toe  . INSERT / REPLACE / REMOVE PACEMAKER      No Known Allergies  Current Outpatient Medications  Medication Sig Dispense Refill  . amLODipine (NORVASC) 10 MG tablet Take 1 tablet (10 mg total) by mouth daily. 90 tablet 3  . aspirin EC 81 MG tablet Take 1 tablet (81 mg total) by mouth daily. 90 tablet 3  . carvedilol (COREG) 25 MG tablet TAKE ONE TABLET BY MOUTH TWICE DAILY WITH A MEAL 60 tablet 0  . furosemide (LASIX) 40 MG tablet TAKE 1 TABLET BY MOUTH ONCE DAILY 30 tablet 0  . lisinopril (PRINIVIL,ZESTRIL) 20 MG tablet Take 1 tablet (20 mg total) by mouth daily. 30 tablet 3  . NITROSTAT 0.4 MG SL tablet Place 1 tablet (0.4 mg total) under the tongue every 5 (five) minutes as needed for chest pain. Please call office. 25 tablet 3  . pantoprazole (PROTONIX) 40 MG tablet Take 1 tablet (40 mg total) by mouth daily. 30-60 mins before breakfast. 90 tablet 1  . potassium chloride SA (K-DUR,KLOR-CON) 20 MEQ tablet Take 1 tablet (20 mEq total) by mouth daily. 30 tablet 3  . sildenafil (VIAGRA) 100 MG tablet Take 1 tablet (100 mg total) by mouth as needed for erectile dysfunction. 20 tablet 6  . rosuvastatin (CRESTOR) 20 MG tablet Take 1 tablet (20 mg total) by mouth daily. 30 tablet 11   No current facility-administered medications for this visit.     REVIEW OF SYSTEMS: see HPI for pertinent positives and negatives    Physical Examination  Vitals:   02/14/17 1527 02/14/17 1531  BP: (!) 173/99 (!) 192/95  Pulse: (!) 58   Resp: 18   Temp: 98.5 F (36.9 C)   TempSrc: Oral   SpO2: 97%   Weight: 192 lb (87.1 kg)   Height: 5\' 10"  (1.778 m)    Body mass index is 27.55 kg/m.  General: The patient appears his stated age.   HEENT:  No gross abnormalities Pulmonary: Respirations are non-labored, CTAB. Abdomen: Soft and non-tender with normal pitched bowel sounds.  Musculoskeletal: There are no major deformities.   Neurologic: No focal weakness or  paresthesias are detected. Skin: There are no ulcer or rashes noted. Psychiatric: The patient has normal affect. Cardiovascular: There is a regular rate and rhythm without significant murmur appreciated.   Vascular: Vessel Right Left  Radial 2+Palpable Not Palpable  Brachial 3+Palpable 3+Palpable  Carotid  without bruit  without bruit  Aorta Not palpable N/A  Femoral 2+Palpable 2+Palpable  Popliteal Not palpable Not palpable  PT notPalpable notPalpable  DP faintlyPalpable notPalpable     DATA  Bilateral LE Arterial Duplex (Date: 02/14/2017): Significant plaque throughout both lower extremities.  75-99% stenosis in the mid (561 cm/s) to distal right SFA.  No color flow or Doppler signal noted in the proximal to mid segment of the thigh extending to near the left SFA, collateral visualized int the mid segment.  Unable to thoroughly visualize distal segment.  The left SFA now appears occluded. Highest velocities in the left SFA on 01-13-16 were 355 cm/s at mid, and 512 cm/s at distal SFA.    ABI (Date: 02/14/2017):  R: 0.84 (was 1.02 on 01-13-16), DP: mono, PT: bi, TBI: 0.73  L: 0.61 (was 0.79), DP: mono, PT: mono, TBI: dampened  Decline in bilateral ABI.     Medical Decision Making  Roger Martinez is a 51 y.o. male who presents with:  bilateral calf intermittent claudication after walking 2.6 miles, without evidence of critical limb ischemia. Pt does not want to have any intervention done at this time. He wants to continue his graduated walking program, and try to stop smoking.  The patient was counseled re smoking cessation and given several free resources re smoking cessation.    Based on the patient's vascular studies and examination, and after discussing with Dr. Donnetta Hutching, I have offered the patient: return in 3 months with ABI's, bilateral LE arterial duplex, and see Dr. Trula Slade only.  He has not yet taken his medications yet today since he has not yet eaten, and his blood  pressure is quite elevated. I advised him to eat as soon as possible, take his medications ASAP, check his blood pressure 1-2 hours later, and if remains greater than 130/80, to notify his PCP or cardiologist.   I discussed in depth with the patient the nature of atherosclerosis, and emphasized the importance of maximal medical management including strict control of blood pressure, blood glucose, and lipid levels, antiplatelet agents, obtaining regular exercise, and cessation of smoking.         I discussed with the patient the natural history of intermittent claudication: 75% of patients have stable or improved symptoms in a year an only 2% require amputation. Eventually 20% may require intervention in a year.  The patient is aware that without maximal medical management the underlying atherosclerotic disease process will progress, limiting the benefit of any interventions. The patient is currently on a statin.   The patient is currently on an anti-platelet medication.   Thank you for allowing [place] to participate in this patient's care.  Annalaya Wile, Sharmon Leyden, RN, MSN, FNP-C [facility] Office: [phone]  02/14/2017, 3:32 PM  Clinic MD: Trula Slade

## 2017-02-14 NOTE — Patient Instructions (Signed)
Steps to Quit Smoking Smoking tobacco can be bad for your health. It can also affect almost every organ in your body. Smoking puts you and people around you at risk for many serious long-lasting (chronic) diseases. Quitting smoking is hard, but it is one of the best things that you can do for your health. It is never too late to quit. What are the benefits of quitting smoking? When you quit smoking, you lower your risk for getting serious diseases and conditions. They can include:  Lung cancer or lung disease.  Heart disease.  Stroke.  Heart attack.  Not being able to have children (infertility).  Weak bones (osteoporosis) and broken bones (fractures).  If you have coughing, wheezing, and shortness of breath, those symptoms may get better when you quit. You may also get sick less often. If you are pregnant, quitting smoking can help to lower your chances of having a baby of low birth weight. What can I do to help me quit smoking? Talk with your doctor about what can help you quit smoking. Some things you can do (strategies) include:  Quitting smoking totally, instead of slowly cutting back how much you smoke over a period of time.  Going to in-person counseling. You are more likely to quit if you go to many counseling sessions.  Using resources and support systems, such as: ? Online chats with a counselor. ? Phone quitlines. ? Printed self-help materials. ? Support groups or group counseling. ? Text messaging programs. ? Mobile phone apps or applications.  Taking medicines. Some of these medicines may have nicotine in them. If you are pregnant or breastfeeding, do not take any medicines to quit smoking unless your doctor says it is okay. Talk with your doctor about counseling or other things that can help you.  Talk with your doctor about using more than one strategy at the same time, such as taking medicines while you are also going to in-person counseling. This can help make  quitting easier. What things can I do to make it easier to quit? Quitting smoking might feel very hard at first, but there is a lot that you can do to make it easier. Take these steps:  Talk to your family and friends. Ask them to support and encourage you.  Call phone quitlines, reach out to support groups, or work with a counselor.  Ask people who smoke to not smoke around you.  Avoid places that make you want (trigger) to smoke, such as: ? Bars. ? Parties. ? Smoke-break areas at work.  Spend time with people who do not smoke.  Lower the stress in your life. Stress can make you want to smoke. Try these things to help your stress: ? Getting regular exercise. ? Deep-breathing exercises. ? Yoga. ? Meditating. ? Doing a body scan. To do this, close your eyes, focus on one area of your body at a time from head to toe, and notice which parts of your body are tense. Try to relax the muscles in those areas.  Download or buy apps on your mobile phone or tablet that can help you stick to your quit plan. There are many free apps, such as QuitGuide from the CDC (Centers for Disease Control and Prevention). You can find more support from smokefree.gov and other websites.  This information is not intended to replace advice given to you by your health care provider. Make sure you discuss any questions you have with your health care provider. Document Released: 01/23/2009 Document   Revised: 11/25/2015 Document Reviewed: 08/13/2014 Elsevier Interactive Patient Education  2018 [employer].     Intermittent Claudication Intermittent claudication is pain in your leg that occurs when you walk or exercise and goes away when you rest. The pain can occur in one or both legs. What are the causes? Intermittent claudication is caused by the buildup of plaque within the major arteries in the body (atherosclerosis). The plaque, which makes arteries stiff and narrow, prevents enough blood from reaching your  leg muscles. The pain occurs when you walk or exercise because your muscles need more blood when you are moving and exercising. What increases the risk? Risk factors include:  A family history of atherosclerosis.  A personal history of stroke or heart disease.  Older age.  Being inactive or overweight.  Smoking cigarettes.  Having another health condition such as: ? Diabetes. ? High blood pressure. ? High cholesterol.  What are the signs or symptoms? Your hip or leg may:  Ache.  Cramp.  Feel tight.  Feel weak.  Feel heavy.  Over time, you may feel pain in your calf, thigh, or hip. How is this diagnosed? Your health care provider may diagnose intermittent claudication based on your symptoms and medical history. Your health care provider may also do tests to learn more about your condition. These may include:  Blood tests.  An ultrasound.  Imaging tests such as angiography, magnetic resonance angiography (MRA), and computed tomography angiography (CTA).  How is this treated? You may be treated for problems such as:  High blood pressure.  High cholesterol.  Diabetes.  Other treatments may include:  Lifestyle changes such as: ? Starting an exercise program. ? Losing weight. ? Quitting smoking.  Medicines to help restore blood flow through your legs.  Blood vessel surgery (angioplasty) to restore blood flow if your intermittent claudication is caused by severe peripheral artery disease.  Follow these instructions at home:  Manage any other health conditions you have.  Eat a diet low in saturated fats and calories to maintain a healthy weight.  Quit smoking, if you smoke.  Take medicines only as directed by your health care provider.  If your health care provider recommended an exercise program for you, follow it as directed. Your exercise program may involve: ? Walking three or more times a week. ? Walking until you have certain symptoms of  intermittent claudication. ? Resting until symptoms go away. ? Gradually increasing walking time to about 50 minutes a day. Contact a health care provider if: Your condition is not getting better or is getting worse. Get help right away if:  You have chest pain.  You have difficulty breathing.  You develop arm weakness.  You have trouble speaking.  Your face begins to droop. This information is not intended to replace advice given to you by your health care provider. Make sure you discuss any questions you have with your health care provider. Document Released: 01/30/2004 Document Revised: 09/04/2015 Document Reviewed: 07/05/2013 Elsevier Interactive Patient Education  2017 [employer].

## 2017-02-14 NOTE — Progress Notes (Signed)
I think this is your patient!

## 2017-02-15 NOTE — Addendum Note (Signed)
Addended by: Lianne Cure A on: 02/15/2017 10:08 AM   Modules accepted: Orders

## 2017-02-22 ENCOUNTER — Encounter: Payer: Self-pay | Admitting: Medical

## 2017-02-25 ENCOUNTER — Encounter: Payer: Self-pay | Admitting: Medical

## 2017-03-07 ENCOUNTER — Ambulatory Visit: Payer: PRIVATE HEALTH INSURANCE | Admitting: Medical

## 2017-03-07 ENCOUNTER — Encounter: Payer: Self-pay | Admitting: Medical

## 2017-03-07 VITALS — BP 132/80 | HR 57 | Wt 191.6 lb

## 2017-03-07 DIAGNOSIS — I2510 Atherosclerotic heart disease of native coronary artery without angina pectoris: Secondary | ICD-10-CM

## 2017-03-07 DIAGNOSIS — I739 Peripheral vascular disease, unspecified: Secondary | ICD-10-CM

## 2017-03-07 DIAGNOSIS — E785 Hyperlipidemia, unspecified: Secondary | ICD-10-CM

## 2017-03-07 DIAGNOSIS — I10 Essential (primary) hypertension: Secondary | ICD-10-CM

## 2017-03-07 DIAGNOSIS — K219 Gastro-esophageal reflux disease without esophagitis: Secondary | ICD-10-CM

## 2017-03-07 MED ORDER — ROSUVASTATIN CALCIUM 20 MG PO TABS: 20.0000 mg | ORAL_TABLET | Freq: Every day | ORAL | 1 refills | Status: DC

## 2017-03-07 MED ORDER — FUROSEMIDE 40 MG PO TABS: 40.0000 mg | ORAL_TABLET | Freq: Every day | ORAL | 1 refills | Status: DC

## 2017-03-07 MED ORDER — CARVEDILOL 25 MG PO TABS: 25.0000 mg | ORAL_TABLET | Freq: Two times a day (BID) | ORAL | 1 refills | Status: AC

## 2017-03-07 MED ORDER — POTASSIUM CHLORIDE CRYS ER 20 MEQ PO TBCR: 20.0000 meq | EXTENDED_RELEASE_TABLET | Freq: Every day | ORAL | 1 refills | Status: DC

## 2017-03-07 MED ORDER — LISINOPRIL 20 MG PO TABS: 20.0000 mg | ORAL_TABLET | Freq: Every day | ORAL | 1 refills | Status: DC

## 2017-03-07 NOTE — Progress Notes (Signed)
Subjective: Chief Complaint  Patient presents with  . med check    med check    Medical team: Dr. Candee Furbish, cardiology Dr. [name], electrophysiologist Dr. Harl Bowie, GI  Here for med check.  Roger Martinez has a hx/o 3 vessel bypass 2011, CAD, HTN, ICD, CHF, hyperlipidemia, hx/o noncompliance, tobacco use.    At his last visit here 06/2016, he couldn't walk but for 5 minutes without leg pain.  He has been working for months to walk regularly and increase exercise tolerance.  He currently is exercising 3 days per week, 2 miles each.  He has been starting a small courier business  Next cardiology f/u 07/2017.    He has no specific c/o.  Feeling good, he is happy with his progress.  He had colonoscopy back in 09/2016, has repeat in 3 years.  Past Medical History:  Diagnosis Date  . Anxiety state, unspecified   . Blood in stool 7-8 months ago  . Chronic combined systolic (congestive) and diastolic (congestive) heart failure ([place])    a. 03/2014 Echo: EF 20-25%, diff HK, Gr 2 DD, mild AI, sev dil LA, mod reduced RV fxn, PASP 4mmHg.  Marland Kitchen Coronary artery disease    a. 2011 s/p CABG x 3.  . GERD (gastroesophageal reflux disease)    takes protonix q3 days as of 12/2015  . Hyperlipidemia   . Hypertension   . Hypertensive heart disease with heart failure ([place])   . Ischemic cardiomyopathy    a. 03/2014 Echo: EF 20-25%;  b. 06/2014 s/p BSX single lead AICD, ser # [id number].  . Marijuana abuse   . MI (myocardial infarction) ([place]) 03/2011  . Noncompliance   . Perirectal abscess    once prior as of 12/2015  . Sciatica   . STD (sexually transmitted disease)   . Tobacco abuse   . Wears glasses    Past Surgical History:  Procedure Laterality Date  . BYPASS GRAFT  2011   3 vessel  . COLONOSCOPY WITH PROPOFOL N/A 09/28/2016   Procedure: COLONOSCOPY WITH PROPOFOL;  Surgeon: Mauri Pole, MD;  Location: [facility] ENDOSCOPY;  Service: Endoscopy;  Laterality: N/A;  . EP IMPLANTABLE DEVICE   06/17/2014   DR [name]  . ESOPHAGOGASTRODUODENOSCOPY (EGD) WITH PROPOFOL N/A 09/28/2016   Procedure: ESOPHAGOGASTRODUODENOSCOPY (EGD) WITH PROPOFOL;  Surgeon: Mauri Pole, MD;  Location: [facility] ENDOSCOPY;  Service: Endoscopy;  Laterality: N/A;  . FOOT SURGERY     trauma repair, 51yo, lawnmower incident left big toe  . IMPLANTABLE CARDIOVERTER DEFIBRILLATOR IMPLANT N/A 06/17/2014   Procedure: IMPLANTABLE CARDIOVERTER DEFIBRILLATOR IMPLANT;  Surgeon: Deboraha Sprang, MD;  Location[facility] LAB;  Service: Cardiovascular;  Laterality: N/A;  . INCISION AND DRAINAGE PERIRECTAL ABSCESS N/A 06/13/2015   Procedure: IRRIGATION AND DEBRIDEMENT PERIRECTAL ABSCESS;  Surgeon: Michael Boston, MD;  Location: [facility] ORS;  Service: General;  Laterality: N/A;  . INSERT / REPLACE / REMOVE PACEMAKER     Current Outpatient Medications on File Prior to Visit  Medication Sig Dispense Refill  . amLODipine (NORVASC) 10 MG tablet Take 1 tablet (10 mg total) by mouth daily. 90 tablet 3  . aspirin EC 81 MG tablet Take 1 tablet (81 mg total) by mouth daily. 90 tablet 3  . NITROSTAT 0.4 MG SL tablet Place 1 tablet (0.4 mg total) under the tongue every 5 (five) minutes as needed for chest pain. Please call office. 25 tablet 3  . sildenafil (VIAGRA) 100 MG tablet Take 1 tablet (100 mg total) by  mouth as needed for erectile dysfunction. 20 tablet 6  . pantoprazole (PROTONIX) 40 MG tablet Take 1 tablet (40 mg total) by mouth daily. 30-60 mins before breakfast. (Patient not taking: Reported on 03/07/2017) 90 tablet 1   No current facility-administered medications on file prior to visit.     ROS as in subjective   Objective: BP 132/80   Pulse (!) 57   Wt 191 lb 9.6 oz (86.9 kg)   SpO2 96%   BMI 27.49 kg/m   BP Readings from Last 3 Encounters:  03/07/17 132/80  02/14/17 (!) 192/95  09/28/16 (!) 187/84   Wt Readings from Last 3 Encounters:  03/07/17 191 lb 9.6 oz (86.9 kg)  02/14/17 192 lb (87.1 kg)  09/28/16 196 lb (88.9  kg)   Gen: wd, wn, nad Lungs clear Heart: rrr, normal s1, s2, no murmurs Ext: no edema Bilat pedal pulses 1+    Assessment: Encounter Diagnoses  Name Primary?  . Essential hypertension Yes  . Gastroesophageal reflux disease, esophagitis presence not specified   . CAD, multiple vessel s/p CABG 2011    . Hyperlipidemia, unspecified hyperlipidemia type   . Claudication of both lower extremities ([place])      Plan: Glad to hear of his progress, regular walking exercise program.    Reviewed the last labs and visit notes this past year with [place] and cardiology.   Continue all current medications.  Condition is stable at this time.  F/u in spring for fasting physical.       Roger Martinez was seen today for med check.  Diagnoses and all orders for this visit:  Essential hypertension  Gastroesophageal reflux disease, esophagitis presence not specified  CAD, multiple vessel s/p CABG 2011   Hyperlipidemia, unspecified hyperlipidemia type  Claudication of both lower extremities ([place])  Other orders -     carvedilol (COREG) 25 MG tablet; Take 1 tablet (25 mg total) by mouth 2 (two) times daily with a meal. -     furosemide (LASIX) 40 MG tablet; Take 1 tablet (40 mg total) by mouth daily. -     lisinopril (PRINIVIL,ZESTRIL) 20 MG tablet; Take 1 tablet (20 mg total) by mouth daily. -     potassium chloride SA (K-DUR,KLOR-CON) 20 MEQ tablet; Take 1 tablet (20 mEq total) by mouth daily. -     rosuvastatin (CRESTOR) 20 MG tablet; Take 1 tablet (20 mg total) by mouth daily.

## 2017-03-10 ENCOUNTER — Other Ambulatory Visit: Payer: Self-pay | Admitting: Cardiology

## 2017-03-11 NOTE — Addendum Note (Signed)
Addended by: Derl Barrow on: 03/11/2017 08:48 AM   Modules accepted: Orders

## 2017-04-19 ENCOUNTER — Ambulatory Visit (INDEPENDENT_AMBULATORY_CARE_PROVIDER_SITE_OTHER): Payer: PRIVATE HEALTH INSURANCE | Admitting: *Deleted

## 2017-04-19 DIAGNOSIS — I255 Ischemic cardiomyopathy: Secondary | ICD-10-CM | POA: Diagnosis not present

## 2017-04-19 NOTE — Progress Notes (Signed)
Remote ICD transmission.   

## 2017-04-20 ENCOUNTER — Encounter: Payer: Self-pay | Admitting: Cardiology

## 2017-04-21 LAB — CUP PACEART REMOTE DEVICE CHECK
Battery Remaining Longevity: 144 mo
Battery Remaining Percentage: 100 %
HIGH POWER IMPEDANCE MEASURED VALUE: 74 Ohm
Implantable Lead Location: 753860
Implantable Lead Model: 293
Implantable Pulse Generator Implant Date: 20160307
Lead Channel Impedance Value: 427 Ohm
Lead Channel Setting Pacing Amplitude: 4 V
Lead Channel Setting Pacing Pulse Width: 0.5 ms
Lead Channel Setting Sensing Sensitivity: 0.6 mV
MDC IDC LEAD IMPLANT DT: 20160307
MDC IDC LEAD SERIAL: 349317
MDC IDC MSMT LEADCHNL RV PACING THRESHOLD AMPLITUDE: 1.9 V
MDC IDC MSMT LEADCHNL RV PACING THRESHOLD PULSEWIDTH: 0.5 ms
MDC IDC PG SERIAL: 201422
MDC IDC SESS DTM: 20190108082600
MDC IDC STAT BRADY RV PERCENT PACED: 0 %

## 2017-05-16 ENCOUNTER — Telehealth: Payer: Self-pay | Admitting: Family Medicine

## 2017-05-16 ENCOUNTER — Telehealth: Payer: Self-pay

## 2017-05-16 ENCOUNTER — Other Ambulatory Visit: Payer: Self-pay

## 2017-05-16 MED ORDER — ROSUVASTATIN CALCIUM 20 MG PO TABS: 20.0000 mg | ORAL_TABLET | Freq: Every day | ORAL | 1 refills | Status: AC

## 2017-05-16 MED ORDER — POTASSIUM CHLORIDE CRYS ER 20 MEQ PO TBCR: 20.0000 meq | EXTENDED_RELEASE_TABLET | Freq: Every day | ORAL | 0 refills | Status: AC

## 2017-05-16 MED ORDER — FUROSEMIDE 40 MG PO TABS: 40.0000 mg | ORAL_TABLET | Freq: Every day | ORAL | 0 refills | Status: AC

## 2017-05-16 NOTE — Telephone Encounter (Signed)
Called and spoke with pt to get the correct address of the [facility] that he need to have his meds sent to. Sent his refill in to the [facility] in [place] ,[place]

## 2017-05-16 NOTE — Telephone Encounter (Signed)
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  He does not have his [place] con or F[place]ose[place]ide.  Please call in meds to [facility] in [place] and then let pt know.  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

## 2017-05-16 NOTE — Telephone Encounter (Signed)
Sent to pharmacy 

## 2017-05-16 NOTE — Telephone Encounter (Signed)
Patient called back and said he also need his Rosuvastatin to the [facility] in [place]

## 2017-05-18 ENCOUNTER — Telehealth: Payer: Self-pay | Admitting: Medical

## 2017-05-18 NOTE — Telephone Encounter (Signed)
I received note from [facility] that he is not refilling or taking his blood pressure medications.    Please inquire.

## 2017-05-18 NOTE — Telephone Encounter (Signed)
Pt is out of town working driving trucks. I just sent in refills [place] to [facility] .

## 2017-05-25 ENCOUNTER — Telehealth: Payer: Self-pay | Admitting: Medical

## 2017-05-25 NOTE — Telephone Encounter (Signed)
Pt called and states that he needs a clearance letter to go to the dentist, states he has a dentist, they need one because he has blood pressure problems and they normally run high, he states that you see him for the blood pressures before, he is going in for a tooth ache issue and they may have to put him to sleep and they want to make sure it is ok, before they see him, pt can be reached at [phone]

## 2017-05-25 NOTE — Telephone Encounter (Signed)
pls write note that he is compliant with medication, is begin treated for blood pressure, and that he can be seen for dental work.  Have him double up on his lisinopril taking 2 tablets daily = 40mg .  All other medications the same  F/u soon as advised.

## 2017-05-25 NOTE — Telephone Encounter (Signed)
Forwarding

## 2017-05-25 NOTE — Telephone Encounter (Signed)
First of all, he is due here for physical in spring, he is due back with cardiology 07/2017.   2nd, what has his BPs been running?  My understanding is that he has been out of town and we called out medications, but I keep getting reminders from his insurance that he is not getting his medications refilled regularly.  He has had issues in the past about not taking his BP medication from time to time.  So please confirm what his BPs have been running and is he taking his medication EVERY DAY?

## 2017-05-25 NOTE — Telephone Encounter (Signed)
Called pt and he states that his blood pressure was 130/95 today and yesterday it was 132/90 and it is due to the pain, the pain started yesterday with the tooth, he has not been to the dentist yet but states that the prior one informed him because of his recent blood pressure issues he would need a clearance note, he states that he has been taking him blood pressure medicines on a regular medicines, and informed him that he needed to schedule a cpe in the spring

## 2017-05-26 NOTE — Telephone Encounter (Signed)
[facility]  [phone]  Dr. Ronnald Ramp

## 2017-05-26 NOTE — Telephone Encounter (Signed)
Called to the office they had already closed .

## 2017-05-27 ENCOUNTER — Telehealth: Payer: Self-pay | Admitting: Cardiology

## 2017-05-27 NOTE — Telephone Encounter (Signed)
Pt will need cardiac clearance for all future dental visits, need a fax if he needs pre-med, or call to out office for procedures, please fax Sonia Side at [facility] @ [phone]

## 2017-05-28 ENCOUNTER — Emergency Department (HOSPITAL_COMMUNITY)
Admission: EM | Admit: 2017-05-28 | Discharge: 2017-05-29 | Discharge status: Against Medical Advice | Payer: PRIVATE HEALTH INSURANCE

## 2017-05-29 ENCOUNTER — Other Ambulatory Visit: Payer: Self-pay

## 2017-05-29 ENCOUNTER — Ambulatory Visit (HOSPITAL_COMMUNITY)
Admission: EM | Admit: 2017-05-29 | Discharge: 2017-05-29 | Disposition: A | Discharge status: Home / Self Care | Payer: PRIVATE HEALTH INSURANCE

## 2017-05-29 DIAGNOSIS — K0889 Other specified disorders of teeth and supporting structures: Secondary | ICD-10-CM | POA: Diagnosis not present

## 2017-05-29 MED ORDER — HYDROCODONE-ACETAMINOPHEN 5-325 MG PO TABS
2.0000 | ORAL_TABLET | Freq: Once | ORAL | Status: AC
  Administered 2017-05-29: 2 via ORAL

## 2017-05-29 MED ORDER — HYDROCODONE-ACETAMINOPHEN 5-325 MG PO TABS: 1.0000 | ORAL_TABLET | Freq: Four times a day (QID) | ORAL | 0 refills | Status: AC | PRN

## 2017-05-29 MED ORDER — HYDROCODONE-ACETAMINOPHEN 5-325 MG PO TABS
ORAL_TABLET | ORAL | Status: AC
  Filled 2017-05-29: qty 2

## 2017-05-29 MED ORDER — CLINDAMYCIN HCL 300 MG PO CAPS: 300.0000 mg | ORAL_CAPSULE | Freq: Three times a day (TID) | ORAL | 0 refills | Status: AC

## 2017-05-29 NOTE — ED Notes (Signed)
Pt states he is leaving and will return if pain returns.

## 2017-05-29 NOTE — Discharge Instructions (Signed)
Please go home and take your blood pressure medicine.  I expect that your blood pressure is also elevated in part due to the severe tooth pain.  I am changing her antibiotic to clindamycin.  Please stop taking the amoxicillin.  Please get back to the dentist in the middle of next week.

## 2017-05-29 NOTE — ED Triage Notes (Signed)
Per pt he's having dental pain and per pt he has a defibrillator, per pt he went to the dentist on Friday, per pt he got ibuprofen  800 mg and it is not working, per pt it's affecting his left ear.

## 2017-05-29 NOTE — ED Provider Notes (Signed)
05/29/2017 2:30 PM   DOB: [birth year] / MRN: [id number]  SUBJECTIVE:  Roger Martinez is a 52 y.o. male presenting for tooth pain over the last 6 days.  Pain is emanating from the left most lateral incisor and is exquisite.  He has been unable to eat.  He cannot tolerate hot or cold liquids on the tooth.  He has seen a dentist and was prescribed 800 mg ibuprofen 3 times daily along with amoxicillin, not Augmentin.  He feels the pain is getting worse.  The dentist advised that he come back in the middle of next week to have the tooth extracted.  He has No Known Allergies.   He  has a past medical history of Anxiety state, unspecified, Blood in stool (7-8 months ago), Chronic combined systolic (congestive) and diastolic (congestive) heart failure ([place]), Coronary artery disease, GERD (gastroesophageal reflux disease), Hyperlipidemia, Hypertension, Hypertensive heart disease with heart failure ([place]), Ischemic cardiomyopathy, Marijuana abuse, MI (myocardial infarction) ([place]) (03/2011), Noncompliance, Perirectal abscess, Sciatica, STD (sexually transmitted disease), Tobacco abuse, and Wears glasses.    He  reports that he has been smoking.  He has a 5.75 pack-year smoking history. he has never used smokeless tobacco. He reports that he drinks about 0.6 oz of alcohol per week. He reports that he uses drugs. Drug: Marijuana. He  has no sexual activity history on file. The patient  has a past surgical history that includes Bypass Graft (2011); Cardiac catheterization (06/17/2014); implantable cardioverter defibrillator implant (N/A, 06/17/2014); Incision and drainage perirectal abscess (N/A, 06/13/2015); Foot surgery; Insert / replace / remove pacemaker; Esophagogastroduodenoscopy (egd) with propofol (N/A, 09/28/2016); and Colonoscopy with propofol (N/A, 09/28/2016).  His family history includes Alzheimer's disease in his father; Diabetes in his mother; Gallbladder disease in his sister; Heart disease in his mother;  Hypertension in his mother; Other in his brother.  Review of Systems  Constitutional: Negative for chills, diaphoresis and fever.  Gastrointestinal: Negative for nausea.  Skin: Negative for rash.  Neurological: Negative for dizziness.    OBJECTIVE:  BP (!) 205/100 (BP Location: Left Arm) Comment: per pt he did not eat anything today and did not take BP meds  Pulse 67   Temp 98.2 F (36.8 C) (Oral)   SpO2 98%   BP Readings from Last 3 Encounters:  05/29/17 (!) 205/100  03/07/17 132/80  02/14/17 (!) 192/95     Physical Exam  Constitutional: He appears well-developed. He is active and cooperative.  Non-toxic appearance.  HENT:  Right Ear: Hearing, tympanic membrane, external ear and ear canal normal.  Left Ear: Hearing, tympanic membrane, external ear and ear canal normal.  Nose: Nose normal. Right sinus exhibits no maxillary sinus tenderness and no frontal sinus tenderness. Left sinus exhibits no maxillary sinus tenderness and no frontal sinus tenderness.  Mouth/Throat: Uvula is midline, oropharynx is clear and moist and mucous membranes are normal. No oropharyngeal exudate, posterior oropharyngeal edema or tonsillar abscesses.  Eyes: Conjunctivae are normal. Pupils are equal, round, and reactive to light.  Cardiovascular: Normal rate.  Pulmonary/Chest: Effort normal. No tachypnea.  Lymphadenopathy:       Head (right side): No submandibular and no tonsillar adenopathy present.       Head (left side): No submandibular and no tonsillar adenopathy present.    He has no cervical adenopathy.  Neurological: He is alert.  Skin: Skin is warm and dry. He is not diaphoretic. No pallor.  Vitals reviewed.   No results found for this or any previous visit (from  the past 72 hour(s)).  No results found.  ASSESSMENT AND PLAN:  Pain, dental doubt: He is in exquisite pain today.  He was at times he is sweating.  Changing his medication to clindamycin and adding in a narcotic to his  regimen.  Of note he has missed his medication today.  He denies chest pain, shortness of breath, DOE, headache at this time.  From a neurological and cardiovascular standpoint he is well-appearing.  His pressure is likely so secondary to missing his medications as well as pain.  His previous measure actually shows well-controlled blood pressure.  He has his wife with him today and they ensure that they will take his blood pressure medicine first thing upon getting home.      The patient is advised to call or return to clinic if he does not see an improvement in symptoms, or to seek the care of the closest emergency department if he worsens with the above plan.   Philis Fendt, MHS, PA-C 05/29/2017 2:30 PM   Tereasa Coop, PA-C 05/29/17 1432

## 2017-05-30 NOTE — Telephone Encounter (Signed)
New message   Seen Roger Martinez 08/2016 Pt of Gillian Shields also has device.  1. What dental office are you calling from? [facility]   2. What is your office phone and fax number?  [place] fax [phone]  What type of procedure is the patient having performed?  Several extractions  3. What date is procedure scheduled or is the patient there now? Today stat emergency procedure   4. What is your question (ex. Antibiotics prior to procedure, holding medication-we need to know how long dentist wants pt to hold med)? Aspirin regimen

## 2017-05-30 NOTE — Telephone Encounter (Signed)
   Chart reviewed as part of pre-operative protocol coverage.H/o CAD s/p PCI, CABG 0370, chronic systolic CHF, ICM with ICD, HTN, hypertensive heart disease, severe LVH, tobacco, PAD followed by vascular, GERD, marijuana abuse, anxiety. Office was contacted about emergent dental extractions under local anesthesia. Dental extractions are considered low risk and therefore based on ACC/AHA guidelines, MELL GUIA would be at acceptable risk for the planned procedure without further cardiovascular testing. Reviewed with Dr. Curt Bears who is in agreement with this. Dental extractions are considered to be low bleeding risk and can typically be performed without interruption of aspirin. Furthermore, since this is emergent procedure, patient would not have time to hold his aspirin in anticipation regardless. Per Dr. Curt Bears, no specific antibiotic prophylaxis is typically needed in device patients. We would recommend close f/u arranged for his blood pressure which was extremely high in the ER in the setting of severe dental pain (although he also has hx of noncompliance) - this can be either with primary care or our office.  Pre-op staff, please call dental office asap with the above information.  Charlie Pitter, PA-C 05/30/2017, 2:26 PM

## 2017-05-30 NOTE — Telephone Encounter (Signed)
Received a call from Kandice Moos, [place], re: pt and is on his way with an emergency.  Per Sonia Side, pt will be having 2 extractions right now as he can see and it will be under local anesthesia.

## 2017-05-30 NOTE — Telephone Encounter (Signed)
[place], spoke with Sonia Side.  I have let him know that per Melina Copa, PA-C, pt is a low risk for dental extractions, but we are concerned with pt's blood pressure, last recorded yesterday, 05/29/16, from the ED visit 205/100.  Sonia Side did advise me that when they check it, if it was really high, they would not do the procedure.  Sonia Side thanked me for quick call back.

## 2017-05-30 NOTE — Telephone Encounter (Signed)
Called  Spoke with the  Dentist [facility])  That the pt gave [place], I was told by a Maudie Mercury that works there that  He hasn't been there since  03/2017. And that he doesn't have any up coming up appt. Called pt back to confirm the name and phone of dentist his phone when to straight to voicemail and his voicemail was full.

## 2017-06-20 ENCOUNTER — Ambulatory Visit: Payer: PRIVATE HEALTH INSURANCE | Admitting: Surgery

## 2017-06-20 ENCOUNTER — Encounter (HOSPITAL_COMMUNITY): Payer: PRIVATE HEALTH INSURANCE

## 2017-07-19 ENCOUNTER — Ambulatory Visit (INDEPENDENT_AMBULATORY_CARE_PROVIDER_SITE_OTHER): Payer: PRIVATE HEALTH INSURANCE | Admitting: *Deleted

## 2017-07-19 DIAGNOSIS — I255 Ischemic cardiomyopathy: Secondary | ICD-10-CM

## 2017-07-19 NOTE — Progress Notes (Signed)
Remote ICD transmission.   

## 2017-07-21 ENCOUNTER — Encounter: Payer: Self-pay | Admitting: Cardiology

## 2017-08-09 LAB — CUP PACEART REMOTE DEVICE CHECK
Date Time Interrogation Session: 20190409045200
HIGH POWER IMPEDANCE MEASURED VALUE: 58 Ohm
Implantable Lead Location: 753860
Implantable Lead Serial Number: 349317
Implantable Pulse Generator Implant Date: 20160307
Lead Channel Impedance Value: 402 Ohm
Lead Channel Pacing Threshold Amplitude: 1.9 V
Lead Channel Pacing Threshold Pulse Width: 0.5 ms
Lead Channel Setting Pacing Amplitude: 4 V
MDC IDC LEAD IMPLANT DT: 20160307
MDC IDC MSMT BATTERY REMAINING LONGEVITY: 144 mo
MDC IDC MSMT BATTERY REMAINING PERCENTAGE: 100 %
MDC IDC SET LEADCHNL RV PACING PULSEWIDTH: 0.5 ms
MDC IDC SET LEADCHNL RV SENSING SENSITIVITY: 0.6 mV
MDC IDC STAT BRADY RV PERCENT PACED: 0 %
Pulse Gen Serial Number: 201422

## 2017-08-22 ENCOUNTER — Ambulatory Visit (HOSPITAL_COMMUNITY)
Admission: RE | Admit: 2017-08-22 | Discharge: 2017-08-22 | Disposition: A | Discharge status: Home / Self Care | Payer: PRIVATE HEALTH INSURANCE | Source: Ambulatory Visit | Attending: Family | Admitting: Family

## 2017-08-22 ENCOUNTER — Ambulatory Visit (INDEPENDENT_AMBULATORY_CARE_PROVIDER_SITE_OTHER): Payer: PRIVATE HEALTH INSURANCE | Admitting: Surgery

## 2017-08-22 ENCOUNTER — Encounter: Payer: Self-pay | Admitting: Surgery

## 2017-08-22 ENCOUNTER — Other Ambulatory Visit: Payer: Self-pay

## 2017-08-22 VITALS — BP 157/94 | HR 49 | Temp 97.6°F | Resp 18 | Ht 70.0 in | Wt 180.0 lb

## 2017-08-22 DIAGNOSIS — F17200 Nicotine dependence, unspecified, uncomplicated: Secondary | ICD-10-CM | POA: Insufficient documentation

## 2017-08-22 DIAGNOSIS — I739 Peripheral vascular disease, unspecified: Secondary | ICD-10-CM | POA: Diagnosis not present

## 2017-08-22 DIAGNOSIS — I70213 Atherosclerosis of native arteries of extremities with intermittent claudication, bilateral legs: Secondary | ICD-10-CM | POA: Insufficient documentation

## 2017-08-22 DIAGNOSIS — I10 Essential (primary) hypertension: Secondary | ICD-10-CM | POA: Insufficient documentation

## 2017-08-22 DIAGNOSIS — Z720 Tobacco use: Secondary | ICD-10-CM

## 2017-08-22 NOTE — Progress Notes (Signed)
Established Intermittent Claudication   History of Present Illness   Roger Martinez is a 52 y.o. ([birth year]) male who presents with chief complaint: intermittent claudication bilateral lower extremities.  The patient has known bilateral SFA occlusive disease.  Patient states last week he was able to complete a [place] without having to stop with claudication symptoms.  He denies rest pain or nonhealing wounds bilateral lower extremities.  He is taking an aspirin and statin daily.  He has a history of a CABG and congestive heart failure.  He however continues to smoke.  Current Outpatient Medications  Medication Sig Dispense Refill  . amLODipine (NORVASC) 10 MG tablet Take 1 tablet (10 mg total) by mouth daily. 90 tablet 3  . aspirin EC 81 MG tablet Take 1 tablet (81 mg total) by mouth daily. 90 tablet 3  . carvedilol (COREG) 25 MG tablet Take 1 tablet (25 mg total) by mouth 2 (two) times daily with a meal. 180 tablet 1  . furosemide (LASIX) 40 MG tablet Take 1 tablet (40 mg total) by mouth daily. 30 tablet 0  . ibuprofen (ADVIL,MOTRIN) 800 MG tablet Take 800 mg by mouth every 8 (eight) hours as needed.    Marland Kitchen lisinopril (PRINIVIL,ZESTRIL) 20 MG tablet Take 1 tablet (20 mg total) by mouth daily. 90 tablet 1  . NITROSTAT 0.4 MG SL tablet PLACE 1 TABLET UNDER TONGUE EVERY 5 MINUTES AS NEEDED FOR CHEST PAIN 75 tablet 1  . pantoprazole (PROTONIX) 40 MG tablet Take 1 tablet (40 mg total) by mouth daily. 30-60 mins before breakfast. 90 tablet 1  . potassium chloride SA (K-DUR,KLOR-CON) 20 MEQ tablet Take 1 tablet (20 mEq total) by mouth daily. 30 tablet 0  . sildenafil (VIAGRA) 100 MG tablet Take 1 tablet (100 mg total) by mouth as needed for erectile dysfunction. 20 tablet 6  . clindamycin (CLEOCIN) 300 MG capsule Take 1 capsule (300 mg total) by mouth 3 (three) times daily. (Patient not taking: Reported on 08/22/2017) 21 capsule 0  . HYDROcodone-acetaminophen (NORCO) 5-325 MG tablet Take 1-2  tablets by mouth every 6 (six) hours as needed for severe pain (May cause constipation). For severe pain only. Do not mix with alcohol, benzodiazepines, muscle relaxer. No refills without office visit. (Patient not taking: Reported on 08/22/2017) 40 tablet 0  . rosuvastatin (CRESTOR) 20 MG tablet Take 1 tablet (20 mg total) by mouth daily. 90 tablet 1   No current facility-administered medications for this visit.     On ROS today: 10 system ROS is negative unless otherwise noted in HPI   Physical Examination   Vitals:   08/22/17 1554 08/22/17 1557  BP: (!) 160/97 (!) 157/94  Pulse: (!) 51 (!) 49  Resp: 18   Temp: 97.6 F (36.4 C)   TempSrc: Oral   SpO2: 98%   Weight: 180 lb (81.6 kg)   Height: 5\' 10"  (1.778 m)    Body mass index is 25.83 kg/m.  General Alert, O x 3, WD, NAD  Pulmonary Sym exp, good B air movt,   Cardiac RRR, Nl S1, S2,  Vascular Vessel Right Left  Radial Palpable Palpable  Popliteal Not palpable Not palpable  PT Not palpable Faintly palpable  DP Not palpable Not palpable    Gastro- intestinal soft, non-distended,  Musculo- skeletal M/S 5/5 throughout  , Extremities without ischemic changes  , No edema present, No visible varicosities , No Lipodermatosclerosis present; no palpable pedal pulses however her feet are warm  to touch with good capillary refill  Neurologic A&o    Non-Invasive Vascular imaging   ABI (08/22/2017)  R:   ABI: 0.82,   PT: bi  DP: bi  TBI:  0.62  L:   ABI: 0.60   PT: mono  DP: mono  TBI: 0.33  BLE Arterial Duplex (08/22/17)  75 to 99% stenosis in R SFA and/or popliteal artery  CTO left SFA   Medical Decision Making   Roger Martinez is a 52 y.o. male who presents with:  bilateral leg intermittent claudication without evidence of critical limb ischemia.   Despite SFA occlusive disease bilaterally patient only experiences mild claudication symptoms which does not keep him from walking long  distances  Recommendations are for continued surveillance unless claudication worsens and rest pain or nonhealing wounds develop  Continue aspirin and statin daily  Encouraged smoking cessation  Encouraged continued walking program  Recheck ABIs in 1 year   Dagoberto Ligas, PA-C [facility] Office: [phone]   I agree with the above.  I have seen and examined the patient.  His claudication symptoms are stable.  I discussed the need for smoking cessation.  He will follow up in 1 year or sooner if his symptoms progress.   Roger Martinez

## 2017-08-22 NOTE — Progress Notes (Signed)
Vitals:   08/22/17 1554  BP: (!) 160/97  Pulse: (!) 51  Resp: 18  Temp: 97.6 F (36.4 C)  TempSrc: Oral  SpO2: 98%  Weight: 180 lb (81.6 kg)  Height: 5\' 10"  (1.778 m)

## 2017-09-16 ENCOUNTER — Other Ambulatory Visit: Payer: Self-pay | Admitting: Medical

## 2017-09-16 NOTE — Telephone Encounter (Signed)
Refill 30 day and no refill.  Get in for physical

## 2017-09-16 NOTE — Telephone Encounter (Signed)
pelase advise  Due to pt being in er. [place]

## 2017-09-16 NOTE — Telephone Encounter (Signed)
Please advise if med can be fill for pt lisinopril and furosemide. Thanks [employer]

## 2017-10-18 ENCOUNTER — Ambulatory Visit (INDEPENDENT_AMBULATORY_CARE_PROVIDER_SITE_OTHER): Payer: PRIVATE HEALTH INSURANCE | Admitting: *Deleted

## 2017-10-18 DIAGNOSIS — I255 Ischemic cardiomyopathy: Secondary | ICD-10-CM

## 2017-10-18 NOTE — Progress Notes (Signed)
Remote ICD transmission.   

## 2017-10-24 ENCOUNTER — Other Ambulatory Visit: Payer: Self-pay | Admitting: Cardiology

## 2017-10-25 ENCOUNTER — Emergency Department (HOSPITAL_COMMUNITY)
Admission: EM | Admit: 2017-10-25 | Discharge: 2017-11-10 | Disposition: E | Payer: PRIVATE HEALTH INSURANCE | Attending: Emergency Medicine | Admitting: Emergency Medicine

## 2017-10-25 ENCOUNTER — Telehealth: Payer: Self-pay

## 2017-10-25 DIAGNOSIS — F1210 Cannabis abuse, uncomplicated: Secondary | ICD-10-CM | POA: Insufficient documentation

## 2017-10-25 DIAGNOSIS — Z7982 Long term (current) use of aspirin: Secondary | ICD-10-CM | POA: Insufficient documentation

## 2017-10-25 DIAGNOSIS — I469 Cardiac arrest, cause unspecified: Secondary | ICD-10-CM | POA: Insufficient documentation

## 2017-10-25 DIAGNOSIS — I4901 Ventricular fibrillation: Secondary | ICD-10-CM

## 2017-10-25 DIAGNOSIS — Z95810 Presence of automatic (implantable) cardiac defibrillator: Secondary | ICD-10-CM | POA: Insufficient documentation

## 2017-10-25 DIAGNOSIS — Z79899 Other long term (current) drug therapy: Secondary | ICD-10-CM | POA: Insufficient documentation

## 2017-10-25 DIAGNOSIS — I2510 Atherosclerotic heart disease of native coronary artery without angina pectoris: Secondary | ICD-10-CM | POA: Insufficient documentation

## 2017-10-25 DIAGNOSIS — F419 Anxiety disorder, unspecified: Secondary | ICD-10-CM | POA: Insufficient documentation

## 2017-10-25 DIAGNOSIS — Z951 Presence of aortocoronary bypass graft: Secondary | ICD-10-CM | POA: Insufficient documentation

## 2017-10-25 DIAGNOSIS — I252 Old myocardial infarction: Secondary | ICD-10-CM | POA: Insufficient documentation

## 2017-10-25 LAB — CBG MONITORING, ED: Glucose-Capillary: 123 mg/dL — ABNORMAL HIGH (ref 70–99)

## 2017-10-25 MED ORDER — SODIUM BICARBONATE 8.4 % IV SOLN
INTRAVENOUS | Status: AC | PRN
  Administered 2017-10-25 (×2): 50 meq via INTRAVENOUS

## 2017-10-25 MED ORDER — EPINEPHRINE PF 1 MG/10ML IJ SOSY
PREFILLED_SYRINGE | INTRAMUSCULAR | Status: AC | PRN
  Administered 2017-10-25 (×5): 1 mg via INTRAVENOUS

## 2017-10-25 MED ORDER — SODIUM BICARBONATE 8.4 % IV SOLN
INTRAVENOUS | Status: AC | PRN
  Administered 2017-10-25: 50 meq via INTRAVENOUS

## 2017-10-25 MED ORDER — EPINEPHRINE PF 1 MG/10ML IJ SOSY
PREFILLED_SYRINGE | INTRAMUSCULAR | Status: AC | PRN
Start: 2017-10-25 — End: 2017-10-25
  Administered 2017-10-25 (×3): 1 mg via INTRAVENOUS

## 2017-10-25 MED ORDER — CALCIUM CHLORIDE 10 % IV SOLN
INTRAVENOUS | Status: AC | PRN
  Administered 2017-10-25: 1 g via INTRAVENOUS

## 2017-10-25 MED ORDER — DEXTROSE 5 % IV SOLN
INTRAVENOUS | Status: AC | PRN
  Administered 2017-10-25: 300 mg via INTRAVENOUS

## 2017-10-25 MED FILL — Medication: Qty: 1 | Status: AC

## 2017-10-26 NOTE — Telephone Encounter (Signed)
Please send a sympathy card and I'll sign please.

## 2017-10-26 NOTE — Telephone Encounter (Signed)
Placing back on your desk to be signed.

## 2017-10-27 NOTE — Telephone Encounter (Signed)
Card ready

## 2017-11-10 NOTE — Code Documentation (Signed)
Pulse check, vfib, shocked at 200 Joules, CPR resumed

## 2017-11-10 NOTE — Code Documentation (Signed)
Pulse check, v fib, shocked 200, CPR resumed

## 2017-11-10 NOTE — Code Documentation (Signed)
Pulse check, v fib, shocked at 200, CPR continued

## 2017-11-10 NOTE — ED Provider Notes (Addendum)
[facility] EMERGENCY DEPARTMENT Provider Note   CSN: [id number] Arrival date & time: Nov 12, 2017  0548     History   Chief Complaint Chief Complaint  Patient presents with  . CPR    HPI Roger Martinez is a 52 y.o. male.  HPI  This is a 52 year old male brought in by EMS with CPR in progress.  Patient reportedly called EMS for shortness of breath.  They states that he requested nitroglycerin upon their arrival.  They attempted BiPAP.  Once they got him loaded in the truck he went unresponsive.  EMS noted a PEA arrest.  He received approximately 10 minutes of CPR prior to arrival.  He received 2 mg of epinephrine prior to arrival.  Upon arrival, initially he was in PEA.  He was given additional 1 mg of epi, 1 amp of bicarb, and given initial 12-lead by EMS with wide QRS, he was given calcium chloride.  During CPR, patient noted to received shocks from his ICD.  A total of 4 shocks were administered from his ICD prior to coming off.  It appeared that he was in ventricular fibrillation on multiple pulse checks.  He was loaded with amiodarone.  He received appropriate ACLS with multiple repeat shocks and medications.  Total time of CPR 50 minutes.  He was in refractory V. fib for approximately 25 minutes of that.  Last rhythm was PEA.  Ultrasound confirmed cardiac standstill.  Time of death 6:21 AM.  Chart review reveals multiple medical problems including ischemic cardiomyopathy, combined heart failure, hypertension, prior MI.  Level 5 caveat for acuity of condition.  Past Medical History:  Diagnosis Date  . Anxiety state, unspecified   . Blood in stool 7-8 months ago  . Chronic combined systolic (congestive) and diastolic (congestive) heart failure ([place])    a. 03/2014 Echo: EF 20-25%, diff HK, Gr 2 DD, mild AI, sev dil LA, mod reduced RV fxn, PASP 3mmHg.  Marland Kitchen Coronary artery disease    a. 2011 s/p CABG x 3.  . GERD (gastroesophageal reflux disease)    takes protonix  q3 days as of 12/2015  . Hyperlipidemia   . Hypertension   . Hypertensive heart disease with heart failure ([place])   . Ischemic cardiomyopathy    a. 03/2014 Echo: EF 20-25%;  b. 06/2014 s/p BSX single lead AICD, ser # [id number].  . Marijuana abuse   . MI (myocardial infarction) ([place]) 03/2011  . Noncompliance   . Perirectal abscess    once prior as of 12/2015  . Sciatica   . STD (sexually transmitted disease)   . Tobacco abuse   . Wears glasses     Patient Active Problem List   Diagnosis Date Noted  . PAD (peripheral artery disease) ([place]) 08/22/2017  . Atherosclerosis of native artery of both lower extremities with intermittent claudication ([place]) 08/22/2017  . Loss of weight   . Rectal bleeding   . Polyp of transverse colon   . Early satiety   . Generalized abdominal pain   . Foreign body in esophagus   . Constipation 06/05/2016  . Annual physical exam 12/17/2015  . Claudication of both lower extremities ([place]) 12/17/2015  . Femoral bruit 12/17/2015  . Special screening for malignant neoplasms, colon 12/17/2015  . Change in bowel function 12/17/2015  . Blood in stool 12/17/2015  . Need for prophylactic vaccination and inoculation against influenza 12/17/2015  . Prostate enlargement 12/17/2015  . NSTEMI (non-ST elevated myocardial infarction) ([place]) 11/22/2015  .  GERD (gastroesophageal reflux disease) 11/22/2015  . Acute kidney injury ([place]) 11/22/2015  . Acute respiratory failure ([place])   . Acute CHF (congestive heart failure) ([place]) 09/07/2015  . Perianal abscess s/p I&D 06/13/2015 06/13/2015  . Perirectal abscess 06/13/2015  . Hypertensive urgency 05/22/2015  . Acute congestive heart failure ([place])   . Acute on chronic combined systolic and diastolic congestive heart failure, NYHA class 3 ([place]) 05/21/2015  . Tobacco use 05/07/2015  . Chronic combined systolic (congestive) and diastolic (congestive) heart failure ([place])   . Hypertensive heart disease with heart failure ([place])   . Chest  pain   . Ischemic cardiomyopathy 06/17/2014  . Essential hypertension 04/05/2013  . H/O medication noncompliance 04/05/2013  . CAD, multiple vessel s/p CABG 2011  04/05/2013  . CAD (coronary artery disease) 03/20/2010  . Cannot sleep 03/18/2010  . HTN (hypertension), malignant 03/16/2010  . HLD (hyperlipidemia) 03/13/2010    Past Surgical History:  Procedure Laterality Date  . BYPASS GRAFT  2011   3 vessel  . COLONOSCOPY WITH PROPOFOL N/A 09/28/2016   Procedure: COLONOSCOPY WITH PROPOFOL;  Surgeon: Mauri Pole, MD;  Location: [facility];  Service: Endoscopy;  Laterality: N/A;  . EP IMPLANTABLE DEVICE  06/17/2014   DR [name]  . ESOPHAGOGASTRODUODENOSCOPY (EGD) WITH PROPOFOL N/A 09/28/2016   Procedure: ESOPHAGOGASTRODUODENOSCOPY (EGD) WITH PROPOFOL;  Surgeon: Mauri Pole, MD;  Location: [facility] ENDOS[facility];  Service: Endoscopy;  Laterality: N/A;  . FOOT SURGERY     trauma repair, 52yo, lawnmower incident left big toe  . IMPLANTABLE CARDIOVERTER DEFIBRILLATOR IMPLANT N/A 06/17/2014   Procedure: IMPLANTABLE CARDIOVERTER DEFIBRILLATOR IMPLANT;  Surgeon: Deboraha Sprang, MD;  Location: [facility] CATH LAB;  Service: Cardiovascular;  Laterality: N/A;  . INCISION AND DRAINAGE PERIRECTAL ABSCESS N/A 06/13/2015   Procedure: IRRIGATION AND DEBRIDEMENT PERIRECTAL ABSCESS;  Surgeon: Michael Boston, MD;  Location: [facility] ORS;  Service: General;  Laterality: N/A;  . INSERT / REPLACE / REMOVE PACEMAKER          Home Medications    Prior to Admission medications   Medication Sig Start Date End Date Taking? Authorizing Provider  amLODipine (NORVASC) 10 MG tablet Take 1 tablet (10 mg total) by mouth daily. 02/07/17   Jerline Pain, MD  aspirin EC 81 MG tablet Take 1 tablet (81 mg total) by mouth daily. 09/24/16   Tysinger, Camelia Eng, PA-C  carvedilol (COREG) 25 MG tablet Take 1 tablet (25 mg total) by mouth 2 (two) times daily with a meal. 03/07/17   Tysinger, Camelia Eng, PA-C  clindamycin (CLEOCIN) 300 MG  capsule Take 1 capsule (300 mg total) by mouth 3 (three) times daily. Patient not taking: Reported on 08/22/2017 05/29/17   Tereasa Coop, PA-C  furosemide (LASIX) 40 MG tablet Take 1 tablet (40 mg total) by mouth daily. 05/16/17   Tysinger, Camelia Eng, PA-C  furosemide (LASIX) 40 MG tablet TAKE 1 TABLET BY MOUTH  DAILY 09/16/17   Tysinger, Camelia Eng, PA-C  HYDROcodone-acetaminophen [facility]) 5-325 MG tablet Take 1-2 tablets by mouth every 6 (six) hours as needed for severe pain (May cause constipation). For severe pain only. Do not mix with alcohol, benzodiazepines, muscle relaxer. No refills without office visit. Patient not taking: Reported on 08/22/2017 05/29/17   Tereasa Coop, PA-C  ibuprofen (ADVIL,MOTRIN) 800 MG tablet Take 800 mg by mouth every 8 (eight) hours as needed.    [provider]  lisinopril (PRINIVIL,ZESTRIL) 20 MG tablet TAKE 1 TABLET BY MOUTH DAILY 09/16/17   Tysinger,  Camelia Eng, PA-C  NITROSTAT 0.4 MG SL tablet PLACE 1 TABLET UNDER TONGUE EVERY 5 MINUTES AS NEEDED FOR CHEST PAIN 03/11/17   Jerline Pain, MD  pantoprazole (PROTONIX) 40 MG tablet Take 1 tablet (40 mg total) by mouth daily. 30-60 mins before breakfast. 09/03/16   Tysinger, Camelia Eng, PA-C  potassium chloride SA (K-DUR,KLOR-CON) 20 MEQ tablet Take 1 tablet (20 mEq total) by mouth daily. 05/16/17   Tysinger, Camelia Eng, PA-C  rosuvastatin (CRESTOR) 20 MG tablet Take 1 tablet (20 mg total) by mouth daily. 05/16/17 08/14/17  Tysinger, Camelia Eng, PA-C  sildenafil (VIAGRA) 100 MG tablet Take 1 tablet (100 mg total) by mouth as needed for erectile dysfunction. 07/29/16   Jerline Pain, MD    Family History Family History  Problem Relation Age of Onset  . Hypertension Mother        deceased.  . Diabetes Mother   . Heart disease Mother   . Alzheimer's disease Father   . Gallbladder disease Sister   . Other Brother        drug abuse, cocaine  . Cancer Neg Hx   . Stroke Neg Hx   . Colon cancer Neg Hx   . Stomach cancer Neg Hx     . Rectal cancer Neg Hx   . Esophageal cancer Neg Hx   . Liver cancer Neg Hx     Social History Social History   Tobacco Use  . Smoking status: Current Some Day Smoker    Packs/day: 0.25    Years: 23.00    Pack years: 5.75  . Smokeless tobacco: Never Used  Substance Use Topics  . Alcohol use: Yes    Alcohol/week: 0.6 oz    Types: 1 Shots of liquor per week    Comment: occ  . Drug use: Yes    Types: Marijuana    Comment: none for  18 days     Allergies   Patient has no known allergies.   Review of Systems Review of Systems  Unable to perform ROS: Patient unresponsive     Physical Exam Updated Vital Signs Pulse (!) 0   Wt 81.6 kg (180 lb)   SpO2 (!) 80%   BMI 25.83 kg/m   Physical Exam  Constitutional:  Unresponsive, CPR in progress  HENT:  Head: Normocephalic and atraumatic.  Eyes:  Pupils 8 mm and minimally responsive  Cardiovascular:  No organized cardiac activity  Pulmonary/Chest:  Intubated with 7 5 ET tube, confirmed with visual, frothy secretions  Abdominal: He exhibits distension.  Musculoskeletal: He exhibits no edema.  Neurological:  GCS 3  Skin: No rash noted.  Psychiatric:  Unable to assess  Nursing note and vitals reviewed.    ED Treatments / Results  Labs (all labs ordered are listed, but only abnormal results are displayed) Labs Reviewed  CBG MONITORING, ED - Abnormal; Notable for the following components:      Result Value   Glucose-Capillary 123 (*)    All other components within normal limits    EKG None  Radiology No results found.  Procedures Procedures (including critical care time)  Cardiopulmonary Resuscitation (CPR) Procedure Note Directed/Performed by: Merryl Hacker I personally directed ancillary staff and/or performed CPR in an effort to regain return of spontaneous circulation and to maintain cardiac, neuro and systemic perfusion.   CRITICAL CARE Performed by: Merryl Hacker   Total  critical care time: 65 minutes  Critical care time was exclusive of separately billable procedures  and treating other patients.  Critical care was necessary to treat or prevent imminent or life-threatening deterioration.  Critical care was time spent personally by me on the following activities: development of treatment plan with patient and/or surrogate as well as nursing, discussions with consultants, evaluation of patient's response to treatment, examination of patient, obtaining history from patient or surrogate, ordering and performing treatments and interventions, ordering and review of laboratory studies, ordering and review of radiographic studies, pulse oximetry and re-evaluation of patient's condition.   Medications Ordered in ED Medications  EPINEPHrine (ADRENALIN) 1 MG/10ML injection (1 mg Intravenous Given 11-07-17 0602)  sodium bicarbonate injection (50 mEq Intravenous Given 11-07-2017 0559)  calcium chloride injection (1 g Intravenous Given 11/07/2017 0555)  EPINEPHrine (ADRENALIN) 1 MG/10ML injection (1 mg Intravenous Given 11-07-17 9030)  amiodarone (CORDARONE) 300 mg in dextrose 5 % 100 mL bolus ( Intravenous Stopped Nov 07, 2017 0639)  sodium bicarbonate injection (50 mEq Intravenous Given 11/07/2017 0611)  calcium chloride injection (1 g Intravenous Given 11/07/17 0923)     Initial Impression / Assessment and Plan / ED Course  I have reviewed the triage vital signs and the nursing notes.  Pertinent labs & imaging results that were available during my care of the patient were reviewed by me and considered in my medical decision making (see chart for details).  Clinical Course as of Oct 26 798  Tue 11-07-17  0707 Attempted to call the patient's main number as well as the number listed for his spouse and sister.  Was unable to contact next of kin.  Will inform nursing and have them further investigate.   [place] Both sister and wife were informed of patient's death.  Wife is in  route to the hospital.   [CH]    Clinical Course User Index [CH] Alison Kubicki, Barbette Hair, MD    Patient presents with CPR in progress.  Had appropriate ACLS.  Airway confirmed.  Patient initially in PEA then refractory V. fib with multiple shocks then PEA.  He received 50 minutes of ACLS protocol.  Pupils fixed and dilated.  Decision made to discontinue efforts.  Time of death 6:21 AM.  Discussed with medical examiner, Dr. Sabra Heck.  Patient is not an ME case.  Final Clinical Impressions(s) / ED Diagnoses   Final diagnoses:  Ventricular fibrillation [facility])  Cardiac arrest [facility])    ED Discharge Orders    None       Usiel Astarita, Barbette Hair, MD November 07, 2017 3007    Merryl Hacker, MD 2017/11/07 [phone]

## 2017-11-10 NOTE — Code Documentation (Signed)
Pulse check, v fib, shocked 200J, CPR resumed

## 2017-11-10 NOTE — Code Documentation (Addendum)
Pulse check, no pulse present, CPR continued

## 2017-11-10 NOTE — Code Documentation (Addendum)
Pulse check, no pulses present, pt internal defibrillator shocked x 4, CPR continued

## 2017-11-10 NOTE — Code Documentation (Signed)
Pulses check, PEA, CPR resumed

## 2017-11-10 NOTE — Code Documentation (Signed)
Pulse check, vfib, shocked at 200J, CPR resumed

## 2017-11-10 NOTE — Code Documentation (Signed)
Pulse check, PEA, CPR resumed

## 2017-11-10 NOTE — Code Documentation (Signed)
Pulse check, PEA, ultrasound confirmed no cardiac activity

## 2017-11-10 NOTE — Code Documentation (Addendum)
Pulse check, v fib, shocked 200J, pupils fixed and dilated

## 2017-11-10 NOTE — Code Documentation (Signed)
Pt placed on LUCAS

## 2017-11-10 NOTE — Code Documentation (Addendum)
Pt comes via [facility], called out for resp distress and CHF, pt cold, pale and diaphoretic, initial pressure 260/160, given one nitro PTA and placed on CPAP, pt went into resp arrest and PEA, CPR started, ongoing for 10 minutes PTA, 2 epi given PTA.

## 2017-11-10 NOTE — ED Notes (Signed)
Paged ME for Dr. Dina Rich

## 2017-11-10 NOTE — Code Documentation (Signed)
Patient time of death occurred at 9

## 2017-11-10 NOTE — ED Notes (Signed)
MD Horton Spoke with family, they wish to visit with pt in ED prior to pt going to morgue. Family is on way. Nurse first made aware.

## 2017-11-10 NOTE — Telephone Encounter (Signed)
Pt wife called stating that patient passed away this morning and she wants to come in tomorrow to have his death certificate signed.

## 2017-11-10 DEATH — deceased

## 2017-11-12 LAB — CUP PACEART REMOTE DEVICE CHECK
Battery Remaining Longevity: 144 mo
Battery Remaining Percentage: 100 %
Brady Statistic RV Percent Paced: 0 %
HIGH POWER IMPEDANCE MEASURED VALUE: 68 Ohm
Implantable Lead Location: 753860
Implantable Lead Serial Number: 349317
Implantable Pulse Generator Implant Date: 20160307
Lead Channel Impedance Value: 436 Ohm
Lead Channel Pacing Threshold Pulse Width: 0.5 ms
Lead Channel Setting Pacing Amplitude: 4 V
Lead Channel Setting Pacing Pulse Width: 0.5 ms
Lead Channel Setting Sensing Sensitivity: 0.6 mV
MDC IDC LEAD IMPLANT DT: 20160307
MDC IDC MSMT LEADCHNL RV PACING THRESHOLD AMPLITUDE: 1.9 V
MDC IDC SESS DTM: 20190709045100
Pulse Gen Serial Number: 201422
